# Patient Record
Sex: Female | Born: 1965 | Race: White | Hispanic: No | Smoking: Former smoker
Health system: Southern US, Community
[De-identification: ages and names within clinical notes are randomized; demographics above are authoritative.]

## PROBLEM LIST (undated history)

## (undated) DIAGNOSIS — M51369 Other intervertebral disc degeneration, lumbar region without mention of lumbar back pain or lower extremity pain: Secondary | ICD-10-CM

## (undated) DIAGNOSIS — G43909 Migraine, unspecified, not intractable, without status migrainosus: Secondary | ICD-10-CM

## (undated) DIAGNOSIS — I10 Essential (primary) hypertension: Secondary | ICD-10-CM

## (undated) DIAGNOSIS — I5020 Unspecified systolic (congestive) heart failure: Secondary | ICD-10-CM

## (undated) DIAGNOSIS — M549 Dorsalgia, unspecified: Secondary | ICD-10-CM

## (undated) DIAGNOSIS — G8929 Other chronic pain: Secondary | ICD-10-CM

## (undated) DIAGNOSIS — T7840XA Allergy, unspecified, initial encounter: Secondary | ICD-10-CM

## (undated) DIAGNOSIS — F329 Major depressive disorder, single episode, unspecified: Secondary | ICD-10-CM

## (undated) DIAGNOSIS — B019 Varicella without complication: Secondary | ICD-10-CM

## (undated) DIAGNOSIS — I255 Ischemic cardiomyopathy: Secondary | ICD-10-CM

## (undated) DIAGNOSIS — R55 Syncope and collapse: Secondary | ICD-10-CM

## (undated) DIAGNOSIS — E785 Hyperlipidemia, unspecified: Secondary | ICD-10-CM

## (undated) DIAGNOSIS — M5136 Other intervertebral disc degeneration, lumbar region: Secondary | ICD-10-CM

## (undated) DIAGNOSIS — R5382 Chronic fatigue, unspecified: Secondary | ICD-10-CM

## (undated) DIAGNOSIS — F32A Depression, unspecified: Secondary | ICD-10-CM

## (undated) DIAGNOSIS — I2510 Atherosclerotic heart disease of native coronary artery without angina pectoris: Secondary | ICD-10-CM

## (undated) HISTORY — DX: Atherosclerotic heart disease of native coronary artery without angina pectoris: I25.10

## (undated) HISTORY — PX: CERVICAL DISC SURGERY: SHX588

## (undated) HISTORY — DX: Major depressive disorder, single episode, unspecified: F32.9

## (undated) HISTORY — DX: Hyperlipidemia, unspecified: E78.5

## (undated) HISTORY — DX: Varicella without complication: B01.9

## (undated) HISTORY — DX: Chronic fatigue, unspecified: R53.82

## (undated) HISTORY — DX: Migraine, unspecified, not intractable, without status migrainosus: G43.909

## (undated) HISTORY — DX: Unspecified systolic (congestive) heart failure: I50.20

## (undated) HISTORY — DX: Allergy, unspecified, initial encounter: T78.40XA

## (undated) HISTORY — PX: TUBAL LIGATION: SHX77

## (undated) HISTORY — DX: Ischemic cardiomyopathy: I25.5

## (undated) HISTORY — DX: Depression, unspecified: F32.A

## (undated) HISTORY — DX: Essential (primary) hypertension: I10

---

## 2003-04-29 ENCOUNTER — Ambulatory Visit (HOSPITAL_COMMUNITY): Admission: RE | Admit: 2003-04-29 | Discharge: 2003-04-30 | Payer: Self-pay | Admitting: Neurosurgery

## 2005-01-10 ENCOUNTER — Ambulatory Visit: Payer: Self-pay | Admitting: Pain Medicine

## 2005-01-23 ENCOUNTER — Ambulatory Visit: Payer: Self-pay | Admitting: Pain Medicine

## 2005-02-13 ENCOUNTER — Ambulatory Visit: Payer: Self-pay | Admitting: Pain Medicine

## 2005-03-16 ENCOUNTER — Ambulatory Visit: Payer: Self-pay | Admitting: Pain Medicine

## 2005-03-22 ENCOUNTER — Ambulatory Visit: Payer: Self-pay | Admitting: Pain Medicine

## 2005-05-04 ENCOUNTER — Ambulatory Visit: Payer: Self-pay | Admitting: Pain Medicine

## 2005-06-13 ENCOUNTER — Ambulatory Visit: Payer: Self-pay | Admitting: Pain Medicine

## 2005-06-19 ENCOUNTER — Ambulatory Visit: Payer: Self-pay | Admitting: Pain Medicine

## 2005-07-20 ENCOUNTER — Ambulatory Visit: Payer: Self-pay | Admitting: Pain Medicine

## 2005-09-20 ENCOUNTER — Ambulatory Visit: Payer: Self-pay | Admitting: Pain Medicine

## 2005-10-24 ENCOUNTER — Ambulatory Visit: Payer: Self-pay | Admitting: Pain Medicine

## 2005-12-16 ENCOUNTER — Emergency Department: Payer: Self-pay | Admitting: Emergency Medicine

## 2006-01-16 ENCOUNTER — Ambulatory Visit: Payer: Self-pay | Admitting: Pain Medicine

## 2006-01-29 ENCOUNTER — Ambulatory Visit: Payer: Self-pay | Admitting: Pain Medicine

## 2006-02-12 ENCOUNTER — Ambulatory Visit: Payer: Self-pay | Admitting: Pain Medicine

## 2006-02-27 ENCOUNTER — Ambulatory Visit: Payer: Self-pay | Admitting: Pain Medicine

## 2006-03-07 ENCOUNTER — Ambulatory Visit: Payer: Self-pay | Admitting: Pain Medicine

## 2006-03-27 ENCOUNTER — Ambulatory Visit: Payer: Self-pay | Admitting: Pain Medicine

## 2006-04-23 ENCOUNTER — Ambulatory Visit: Payer: Self-pay | Admitting: Pain Medicine

## 2006-05-03 ENCOUNTER — Ambulatory Visit: Payer: Self-pay | Admitting: Pain Medicine

## 2006-05-07 ENCOUNTER — Ambulatory Visit: Payer: Self-pay | Admitting: Pain Medicine

## 2006-06-05 ENCOUNTER — Ambulatory Visit: Payer: Self-pay | Admitting: Pain Medicine

## 2006-07-09 ENCOUNTER — Ambulatory Visit: Payer: Self-pay | Admitting: Pain Medicine

## 2006-08-07 ENCOUNTER — Ambulatory Visit: Payer: Self-pay | Admitting: Pain Medicine

## 2006-08-20 ENCOUNTER — Ambulatory Visit: Payer: Self-pay | Admitting: Pain Medicine

## 2006-09-13 ENCOUNTER — Ambulatory Visit: Payer: Self-pay | Admitting: Pain Medicine

## 2006-10-16 ENCOUNTER — Ambulatory Visit: Payer: Self-pay | Admitting: Pain Medicine

## 2006-11-21 ENCOUNTER — Ambulatory Visit: Payer: Self-pay | Admitting: Pain Medicine

## 2006-11-28 ENCOUNTER — Ambulatory Visit: Payer: Self-pay | Admitting: Pain Medicine

## 2006-12-18 ENCOUNTER — Ambulatory Visit: Payer: Self-pay | Admitting: Pain Medicine

## 2007-01-17 ENCOUNTER — Ambulatory Visit: Payer: Self-pay | Admitting: Pain Medicine

## 2007-01-28 ENCOUNTER — Ambulatory Visit: Payer: Self-pay | Admitting: Pain Medicine

## 2007-02-14 ENCOUNTER — Ambulatory Visit: Payer: Self-pay | Admitting: Pain Medicine

## 2007-03-11 ENCOUNTER — Ambulatory Visit: Payer: Self-pay | Admitting: Pain Medicine

## 2007-04-09 ENCOUNTER — Ambulatory Visit: Payer: Self-pay | Admitting: Pain Medicine

## 2007-04-17 ENCOUNTER — Ambulatory Visit: Payer: Self-pay | Admitting: Pain Medicine

## 2007-05-15 ENCOUNTER — Ambulatory Visit: Payer: Self-pay | Admitting: Pain Medicine

## 2007-06-20 ENCOUNTER — Ambulatory Visit: Payer: Self-pay | Admitting: Pain Medicine

## 2007-07-08 ENCOUNTER — Ambulatory Visit: Payer: Self-pay | Admitting: Pain Medicine

## 2007-07-18 ENCOUNTER — Ambulatory Visit: Payer: Self-pay | Admitting: Pain Medicine

## 2007-08-13 ENCOUNTER — Ambulatory Visit: Payer: Self-pay | Admitting: Pain Medicine

## 2007-08-26 ENCOUNTER — Ambulatory Visit: Payer: Self-pay | Admitting: Pain Medicine

## 2007-09-10 ENCOUNTER — Ambulatory Visit: Payer: Self-pay | Admitting: Pain Medicine

## 2007-10-15 ENCOUNTER — Ambulatory Visit: Payer: Self-pay | Admitting: Pain Medicine

## 2007-11-18 ENCOUNTER — Ambulatory Visit: Payer: Self-pay | Admitting: Pain Medicine

## 2007-12-12 ENCOUNTER — Ambulatory Visit: Payer: Self-pay | Admitting: Pain Medicine

## 2008-01-07 ENCOUNTER — Ambulatory Visit: Payer: Self-pay | Admitting: Pain Medicine

## 2008-01-08 ENCOUNTER — Ambulatory Visit: Payer: Self-pay | Admitting: Pain Medicine

## 2008-02-11 ENCOUNTER — Ambulatory Visit: Payer: Self-pay | Admitting: Pain Medicine

## 2008-03-02 ENCOUNTER — Ambulatory Visit: Payer: Self-pay | Admitting: Pain Medicine

## 2008-04-09 ENCOUNTER — Ambulatory Visit: Payer: Self-pay | Admitting: Pain Medicine

## 2008-05-12 ENCOUNTER — Ambulatory Visit: Payer: Self-pay | Admitting: Pain Medicine

## 2008-06-09 ENCOUNTER — Ambulatory Visit: Payer: Self-pay | Admitting: Pain Medicine

## 2008-06-15 ENCOUNTER — Ambulatory Visit: Payer: Self-pay | Admitting: Pain Medicine

## 2008-07-09 ENCOUNTER — Ambulatory Visit: Payer: Self-pay | Admitting: Pain Medicine

## 2008-07-27 ENCOUNTER — Ambulatory Visit: Payer: Self-pay | Admitting: Pain Medicine

## 2008-08-24 ENCOUNTER — Ambulatory Visit: Payer: Self-pay | Admitting: Pain Medicine

## 2008-09-28 ENCOUNTER — Ambulatory Visit: Payer: Self-pay | Admitting: Pain Medicine

## 2008-10-27 ENCOUNTER — Ambulatory Visit: Payer: Self-pay | Admitting: Pain Medicine

## 2008-11-24 ENCOUNTER — Ambulatory Visit: Payer: Self-pay | Admitting: Pain Medicine

## 2008-11-30 ENCOUNTER — Ambulatory Visit: Payer: Self-pay | Admitting: Pain Medicine

## 2008-12-24 ENCOUNTER — Ambulatory Visit: Payer: Self-pay | Admitting: Pain Medicine

## 2009-01-19 ENCOUNTER — Ambulatory Visit: Payer: Self-pay | Admitting: Pain Medicine

## 2009-02-16 ENCOUNTER — Ambulatory Visit: Payer: Self-pay | Admitting: Pain Medicine

## 2009-03-01 ENCOUNTER — Ambulatory Visit: Payer: Self-pay | Admitting: Pain Medicine

## 2009-03-16 ENCOUNTER — Ambulatory Visit: Payer: Self-pay | Admitting: Pain Medicine

## 2009-04-12 ENCOUNTER — Ambulatory Visit: Payer: Self-pay | Admitting: Pain Medicine

## 2009-05-11 ENCOUNTER — Ambulatory Visit: Payer: Self-pay | Admitting: Pain Medicine

## 2009-07-07 ENCOUNTER — Ambulatory Visit: Payer: Self-pay | Admitting: Pain Medicine

## 2009-08-10 ENCOUNTER — Ambulatory Visit: Payer: Self-pay | Admitting: Pain Medicine

## 2009-08-16 ENCOUNTER — Ambulatory Visit: Payer: Self-pay | Admitting: Pain Medicine

## 2009-09-07 ENCOUNTER — Ambulatory Visit: Payer: Self-pay | Admitting: Pain Medicine

## 2009-09-13 ENCOUNTER — Ambulatory Visit: Payer: Self-pay | Admitting: Pain Medicine

## 2009-10-07 ENCOUNTER — Ambulatory Visit: Payer: Self-pay | Admitting: Pain Medicine

## 2009-10-25 ENCOUNTER — Ambulatory Visit: Payer: Self-pay | Admitting: Pain Medicine

## 2009-11-09 ENCOUNTER — Ambulatory Visit: Payer: Self-pay | Admitting: Pain Medicine

## 2009-12-08 ENCOUNTER — Ambulatory Visit: Payer: Self-pay | Admitting: Pain Medicine

## 2010-01-04 ENCOUNTER — Ambulatory Visit: Payer: Self-pay | Admitting: Pain Medicine

## 2010-01-31 ENCOUNTER — Ambulatory Visit: Payer: Self-pay | Admitting: Pain Medicine

## 2010-02-24 ENCOUNTER — Ambulatory Visit: Payer: Self-pay | Admitting: Pain Medicine

## 2010-03-21 ENCOUNTER — Ambulatory Visit: Payer: Self-pay | Admitting: Pain Medicine

## 2010-03-31 ENCOUNTER — Ambulatory Visit: Payer: Self-pay | Admitting: Pain Medicine

## 2010-04-18 ENCOUNTER — Ambulatory Visit: Payer: Self-pay | Admitting: Pain Medicine

## 2010-05-26 ENCOUNTER — Ambulatory Visit: Payer: Self-pay | Admitting: Pain Medicine

## 2010-06-13 ENCOUNTER — Ambulatory Visit: Payer: Self-pay | Admitting: Pain Medicine

## 2010-07-26 ENCOUNTER — Ambulatory Visit: Payer: Self-pay | Admitting: Pain Medicine

## 2010-08-22 ENCOUNTER — Ambulatory Visit: Payer: Self-pay | Admitting: Pain Medicine

## 2010-09-29 ENCOUNTER — Ambulatory Visit: Payer: Self-pay | Admitting: Pain Medicine

## 2010-10-31 ENCOUNTER — Ambulatory Visit: Payer: Self-pay | Admitting: Pain Medicine

## 2010-11-29 ENCOUNTER — Ambulatory Visit: Payer: Self-pay | Admitting: Pain Medicine

## 2010-12-27 ENCOUNTER — Ambulatory Visit: Payer: Self-pay | Admitting: Pain Medicine

## 2011-01-26 ENCOUNTER — Ambulatory Visit: Payer: Self-pay | Admitting: Pain Medicine

## 2011-02-22 ENCOUNTER — Ambulatory Visit: Payer: Self-pay | Admitting: Pain Medicine

## 2011-03-23 ENCOUNTER — Ambulatory Visit: Payer: Self-pay | Admitting: Pain Medicine

## 2011-04-27 ENCOUNTER — Ambulatory Visit: Payer: Self-pay | Admitting: Pain Medicine

## 2011-05-08 ENCOUNTER — Ambulatory Visit: Payer: Self-pay | Admitting: Pain Medicine

## 2011-05-23 ENCOUNTER — Ambulatory Visit: Payer: Self-pay | Admitting: Pain Medicine

## 2011-06-21 ENCOUNTER — Ambulatory Visit: Payer: Self-pay | Admitting: Pain Medicine

## 2011-07-20 ENCOUNTER — Ambulatory Visit: Payer: Self-pay | Admitting: Pain Medicine

## 2011-08-22 ENCOUNTER — Ambulatory Visit: Payer: Self-pay | Admitting: Pain Medicine

## 2011-09-04 ENCOUNTER — Ambulatory Visit: Payer: Self-pay | Admitting: Pain Medicine

## 2011-09-27 ENCOUNTER — Ambulatory Visit: Payer: Self-pay | Admitting: Pain Medicine

## 2011-10-04 ENCOUNTER — Ambulatory Visit: Payer: Self-pay | Admitting: Pain Medicine

## 2011-10-26 ENCOUNTER — Ambulatory Visit: Payer: Self-pay | Admitting: Pain Medicine

## 2011-11-23 ENCOUNTER — Ambulatory Visit: Payer: Self-pay | Admitting: Pain Medicine

## 2011-11-29 ENCOUNTER — Ambulatory Visit: Payer: Self-pay | Admitting: Pain Medicine

## 2011-12-20 ENCOUNTER — Ambulatory Visit: Payer: Self-pay | Admitting: Pain Medicine

## 2012-01-04 ENCOUNTER — Emergency Department: Payer: Self-pay | Admitting: Emergency Medicine

## 2012-01-04 LAB — COMPREHENSIVE METABOLIC PANEL
Alkaline Phosphatase: 88 U/L (ref 50–136)
Anion Gap: 9 (ref 7–16)
Chloride: 104 mmol/L (ref 98–107)
Co2: 28 mmol/L (ref 21–32)
Creatinine: 0.75 mg/dL (ref 0.60–1.30)
EGFR (Non-African Amer.): >60
Osmolality: 279 (ref 275–301)
SGOT(AST): 24 U/L (ref 15–37)
SGPT (ALT): 24 U/L

## 2012-01-04 LAB — URINALYSIS, COMPLETE
Bilirubin,UR: NEGATIVE
Glucose,UR: NEGATIVE mg/dL (ref 0–75)
Ketone: NEGATIVE
Nitrite: NEGATIVE
Ph: 6 (ref 4.5–8.0)
RBC,UR: 1 /HPF (ref 0–5)
WBC UR: 1 /HPF (ref 0–5)

## 2012-01-04 LAB — CBC
HGB: 13.7 g/dL (ref 12.0–16.0)
MCHC: 33.9 g/dL (ref 32.0–36.0)
RDW: 12.6 % (ref 11.5–14.5)
WBC: 10.1 10*3/uL (ref 3.6–11.0)

## 2012-01-18 ENCOUNTER — Ambulatory Visit: Payer: Self-pay | Admitting: Pain Medicine

## 2012-01-31 ENCOUNTER — Ambulatory Visit: Payer: Self-pay | Admitting: Pain Medicine

## 2012-02-20 ENCOUNTER — Ambulatory Visit: Payer: Self-pay | Admitting: Pain Medicine

## 2012-03-19 ENCOUNTER — Ambulatory Visit: Payer: Self-pay | Admitting: Pain Medicine

## 2012-04-10 ENCOUNTER — Ambulatory Visit: Payer: Self-pay | Admitting: Pain Medicine

## 2012-05-13 ENCOUNTER — Ambulatory Visit: Payer: Self-pay | Admitting: Pain Medicine

## 2012-06-18 ENCOUNTER — Ambulatory Visit: Payer: Self-pay | Admitting: Pain Medicine

## 2012-07-01 ENCOUNTER — Ambulatory Visit: Payer: Self-pay | Admitting: Pain Medicine

## 2012-07-18 ENCOUNTER — Ambulatory Visit: Payer: Self-pay | Admitting: Pain Medicine

## 2012-08-14 ENCOUNTER — Ambulatory Visit: Payer: Self-pay | Admitting: Pain Medicine

## 2012-09-12 ENCOUNTER — Ambulatory Visit: Payer: Self-pay | Admitting: Pain Medicine

## 2012-09-30 ENCOUNTER — Ambulatory Visit: Payer: Self-pay | Admitting: Pain Medicine

## 2012-10-15 ENCOUNTER — Ambulatory Visit: Payer: Self-pay | Admitting: Pain Medicine

## 2012-11-18 ENCOUNTER — Ambulatory Visit: Payer: Self-pay | Admitting: Pain Medicine

## 2012-12-11 ENCOUNTER — Ambulatory Visit: Payer: Self-pay | Admitting: Pain Medicine

## 2013-01-09 ENCOUNTER — Ambulatory Visit: Payer: Self-pay | Admitting: Pain Medicine

## 2013-02-12 ENCOUNTER — Ambulatory Visit: Payer: Self-pay | Admitting: Pain Medicine

## 2013-03-10 ENCOUNTER — Ambulatory Visit: Payer: Self-pay | Admitting: Pain Medicine

## 2013-04-08 ENCOUNTER — Ambulatory Visit: Payer: Self-pay | Admitting: Pain Medicine

## 2013-05-14 ENCOUNTER — Ambulatory Visit: Payer: Self-pay | Admitting: Pain Medicine

## 2013-06-11 ENCOUNTER — Ambulatory Visit: Payer: Self-pay | Admitting: Pain Medicine

## 2013-07-10 ENCOUNTER — Ambulatory Visit: Payer: Self-pay | Admitting: Pain Medicine

## 2013-08-11 ENCOUNTER — Ambulatory Visit: Payer: Self-pay | Admitting: Pain Medicine

## 2013-09-09 ENCOUNTER — Ambulatory Visit: Payer: Self-pay | Admitting: Pain Medicine

## 2013-09-29 ENCOUNTER — Ambulatory Visit: Payer: Self-pay | Admitting: Pain Medicine

## 2013-10-09 ENCOUNTER — Ambulatory Visit: Payer: Self-pay | Admitting: Pain Medicine

## 2013-10-27 ENCOUNTER — Ambulatory Visit: Payer: Self-pay | Admitting: Pain Medicine

## 2013-11-04 ENCOUNTER — Ambulatory Visit: Payer: Self-pay | Admitting: Pain Medicine

## 2013-12-03 ENCOUNTER — Ambulatory Visit: Payer: Self-pay | Admitting: Pain Medicine

## 2014-01-06 ENCOUNTER — Ambulatory Visit: Payer: Self-pay | Admitting: Pain Medicine

## 2014-02-05 ENCOUNTER — Ambulatory Visit: Payer: Self-pay | Admitting: Pain Medicine

## 2014-03-04 ENCOUNTER — Ambulatory Visit: Payer: Self-pay | Admitting: Pain Medicine

## 2014-04-07 ENCOUNTER — Ambulatory Visit: Payer: Self-pay | Admitting: Pain Medicine

## 2014-04-22 ENCOUNTER — Ambulatory Visit: Payer: Self-pay | Admitting: Pain Medicine

## 2014-05-07 ENCOUNTER — Ambulatory Visit: Payer: Self-pay | Admitting: Pain Medicine

## 2014-05-20 ENCOUNTER — Ambulatory Visit: Payer: Self-pay | Admitting: Pain Medicine

## 2014-06-02 ENCOUNTER — Ambulatory Visit: Payer: Self-pay | Admitting: Pain Medicine

## 2014-06-29 ENCOUNTER — Ambulatory Visit: Payer: Self-pay | Admitting: Pain Medicine

## 2014-07-23 ENCOUNTER — Ambulatory Visit: Payer: Self-pay | Admitting: Pain Medicine

## 2014-08-10 ENCOUNTER — Ambulatory Visit: Payer: Self-pay | Admitting: Pain Medicine

## 2014-09-03 ENCOUNTER — Ambulatory Visit: Payer: Self-pay | Admitting: Pain Medicine

## 2014-10-01 ENCOUNTER — Ambulatory Visit: Payer: Self-pay | Admitting: Pain Medicine

## 2014-10-28 ENCOUNTER — Ambulatory Visit: Payer: Self-pay | Admitting: Pain Medicine

## 2014-11-26 ENCOUNTER — Ambulatory Visit: Payer: Self-pay | Admitting: Pain Medicine

## 2014-12-02 ENCOUNTER — Ambulatory Visit: Payer: Self-pay | Admitting: Pain Medicine

## 2014-12-29 ENCOUNTER — Ambulatory Visit
Admit: 2014-12-29 | Disposition: A | Discharge status: Home / Self Care | Payer: Self-pay | Attending: Pain Medicine | Admitting: Pain Medicine

## 2015-01-28 ENCOUNTER — Ambulatory Visit: Payer: Self-pay | Attending: Pain Medicine | Admitting: Pain Medicine

## 2015-01-28 ENCOUNTER — Encounter: Payer: Self-pay | Admitting: Pain Medicine

## 2015-01-28 ENCOUNTER — Encounter (INDEPENDENT_AMBULATORY_CARE_PROVIDER_SITE_OTHER): Payer: Self-pay

## 2015-01-28 VITALS — BP 163/85 | HR 82 | Temp 98.4°F | Resp 16 | Wt 139.0 lb

## 2015-01-28 DIAGNOSIS — M5137 Other intervertebral disc degeneration, lumbosacral region: Secondary | ICD-10-CM

## 2015-01-28 DIAGNOSIS — M51379 Other intervertebral disc degeneration, lumbosacral region without mention of lumbar back pain or lower extremity pain: Secondary | ICD-10-CM | POA: Insufficient documentation

## 2015-01-28 DIAGNOSIS — G588 Other specified mononeuropathies: Secondary | ICD-10-CM | POA: Insufficient documentation

## 2015-01-28 DIAGNOSIS — R51 Headache: Secondary | ICD-10-CM | POA: Insufficient documentation

## 2015-01-28 DIAGNOSIS — M5481 Occipital neuralgia: Secondary | ICD-10-CM

## 2015-01-28 DIAGNOSIS — M545 Low back pain: Secondary | ICD-10-CM | POA: Insufficient documentation

## 2015-01-28 MED ORDER — TRAMADOL HCL 50 MG PO TABS: ORAL_TABLET | ORAL | Status: DC

## 2015-01-28 MED ORDER — HYDROCODONE-ACETAMINOPHEN 5-325 MG PO TABS: ORAL_TABLET | ORAL | Status: DC

## 2015-01-28 MED ORDER — ORPHENADRINE CITRATE ER 100 MG PO TB12: ORAL_TABLET | ORAL | Status: DC

## 2015-01-28 NOTE — Addendum Note (Signed)
Addended by: Valerie SaltsICE, Whitleigh Garramone M on: 01/28/2015 02:21 PM   Modules accepted: Level of Service

## 2015-01-28 NOTE — Progress Notes (Signed)
Ambulatory  Teach back 3 no aspirin products for 3 days 1026 scripts for hydrocodone  tramadol

## 2015-01-28 NOTE — Progress Notes (Signed)
   Subjective:    Patient ID: Crystal Roberson, female    DOB: [birth year], 49 y.o.   MRN: [id number]  HPI  Review of Systems LOW BACK PAIN LOWER EXTREMITY ESPECIALLY LEFT WITH SPASMS    MILD HEADACHE     Objective:   Physical Exam  Constitutional: She is oriented to person, place, and time. She appears well-developed and well-nourished.  HENT:  Head: Normocephalic.  Eyes: Pupils are equal, round, and reactive to light.  Neck: Normal range of motion. Neck supple.  Cardiovascular: Normal rate.   Pulmonary/Chest: Effort normal and breath sounds normal.  Abdominal: Soft. Bowel sounds are normal.  Musculoskeletal: She exhibits tenderness.  Neurological: She is alert and oriented to person, place, and time. She has normal reflexes.   TENDERNESS OF LUMBAR REGION SEVERE ON LEFT WITH DECREASED SLR        Assessment & Plan:  Continue  Medications. We started norflex and pt takes zanaflex  As well. Ptt knows difference between norflex and zanaflex Nurses remind  Pt of resp and   Confusion. F/u Dr Vear ClockPhillips re bp and general condition  SELECTIVE NERVE ROOT BLOCK [facility] 02-08-15

## 2015-01-28 NOTE — Patient Instructions (Signed)
Selective Nerve Root Block Patient Information  Description: Specific nerve roots exit the spinal canal and these nerves can be compressed and inflamed by a bulging disc and bone spurs.  By injecting steroids on the nerve root, we can potentially decrease the inflammation surrounding these nerves, which often leads to decreased pain.  Also, by injecting local anesthesia on the nerve root, this can provide [place] helpful information to give to your referring doctor if it decreases your pain.  Selective nerve root blocks can be done along the spine from the neck to the low back depending on the location of your pain.   After numbing the skin with local anesthesia, a small needle is passed to the nerve root and the position of the needle is verified using x-ray pictures.  After the needle is in correct position, we then deposit the medication.  You may experience a pressure sensation while this is being done.  The entire block usually lasts less than 15 minutes.  Conditions that may be treated with selective nerve root blocks:  Low back and leg pain  Spinal stenosis  Diagnostic block prior to potential surgery  Neck and arm pain  Post laminectomy syndrome  Preparation for the injection:  1. Do not eat any solid food or dairy products within 6 hours of your appointment. 2. You may drink clear liquids up to 2 hours before an appointment.  Clear liquids include water, black coffee, juice or soda.  No milk or cream please. 3. You may take your regular medications, including pain medications, with a sip of water before your appointment.  Diabetics should hold regular insulin (if taken separately) and take 1/2 normal NPH dose the morning of the procedure.  Carry some sugar containing items with you to your appointment. 4. A driver must accompany you and be prepared to drive you home after your procedure. 5. Bring all your current medications with you. 6. An IV may be inserted and sedation may be given at  the discretion of the physician. 7. A blood pressure cuff, EKG, and other monitors will often be applied during the procedure.  Some patients may need to have extra oxygen administered for a short period. 8. You will be asked to provide medical information, including allergies, prior to the procedure.  We must know immediately if you are taking blood  Thinners (like Coumadin) or if you are allergic to IV iodine contrast (dye).  Possible side-effects: All are usually temporary  Bleeding from needle site  Light headedness  Numbness and tingling  Decreased blood pressure  Weakness in arms/legs  Pressure sensation in back/neck  Pain at injection site (several days)  Possible complications: All are extremely rare  Infection  Nerve injury  Spinal headache (a headache wore with upright position)  Call if you experience:  Fever/chills associated with headache or increased back/neck pain  Headache worsened by an upright position  New onset weakness or numbness of an extremity below the injection site  Hives or difficulty breathing (go to the emergency room)  Inflammation or drainage at the injection site(s)  Severe back/neck pain greater than usual  New symptoms which are concerning to you  Please note:  Although the local anesthetic injected can often make your back or neck feel good for several hours after the injection the pain will likely return.  It takes 3-5 days for steroids to work on the nerve root. You may not notice any pain relief for at least one week.  If effective,  we will often do a series of 3 injections spaced 3-6 weeks apart to maximally decrease your pain.    If you have any questions, please call [phone] [facility]

## 2015-02-07 ENCOUNTER — Encounter: Payer: Self-pay | Admitting: Pain Medicine

## 2015-02-07 DIAGNOSIS — M5481 Occipital neuralgia: Secondary | ICD-10-CM

## 2015-02-07 DIAGNOSIS — M533 Sacrococcygeal disorders, not elsewhere classified: Secondary | ICD-10-CM

## 2015-02-07 DIAGNOSIS — M5136 Other intervertebral disc degeneration, lumbar region: Secondary | ICD-10-CM | POA: Insufficient documentation

## 2015-02-07 DIAGNOSIS — M51369 Other intervertebral disc degeneration, lumbar region without mention of lumbar back pain or lower extremity pain: Secondary | ICD-10-CM

## 2015-02-08 ENCOUNTER — Encounter: Payer: Self-pay | Admitting: Pain Medicine

## 2015-02-08 ENCOUNTER — Ambulatory Visit: Payer: Self-pay | Attending: Pain Medicine | Admitting: Pain Medicine

## 2015-02-08 VITALS — BP 125/67 | HR 72 | Temp 98.2°F | Resp 12 | Ht 65.0 in | Wt 139.0 lb

## 2015-02-08 DIAGNOSIS — M5136 Other intervertebral disc degeneration, lumbar region: Secondary | ICD-10-CM | POA: Insufficient documentation

## 2015-02-08 DIAGNOSIS — M5416 Radiculopathy, lumbar region: Secondary | ICD-10-CM

## 2015-02-08 DIAGNOSIS — M51369 Other intervertebral disc degeneration, lumbar region without mention of lumbar back pain or lower extremity pain: Secondary | ICD-10-CM

## 2015-02-08 MED ORDER — BUPIVACAINE HCL (PF) 0.25 % IJ SOLN
INTRAMUSCULAR | Status: AC
  Administered 2015-02-08: 30 mL
  Filled 2015-02-08: qty 30

## 2015-02-08 MED ORDER — ORPHENADRINE CITRATE 30 MG/ML IJ SOLN
INTRAMUSCULAR | Status: AC
  Administered 2015-02-08: 60 mg
  Filled 2015-02-08: qty 2

## 2015-02-08 MED ORDER — MIDAZOLAM HCL 5 MG/5ML IJ SOLN
INTRAMUSCULAR | Status: AC
  Administered 2015-02-08: 4 mg via INTRAVENOUS
  Filled 2015-02-08: qty 5

## 2015-02-08 MED ORDER — FENTANYL CITRATE (PF) 100 MCG/2ML IJ SOLN
INTRAMUSCULAR | Status: AC
  Administered 2015-02-08: 100 ug via INTRAVASCULAR
  Filled 2015-02-08: qty 2

## 2015-02-08 MED ORDER — TRIAMCINOLONE ACETONIDE 40 MG/ML IJ SUSP
INTRAMUSCULAR | Status: AC
  Administered 2015-02-08: 10 mg
  Filled 2015-02-08: qty 1

## 2015-02-08 NOTE — Progress Notes (Signed)
   Subjective:    Patient ID: Crystal Roberson, female    DOB: [birth year], 49 y.o.   MRN: [id number]  HPI    Review of Systems     Objective:   Physical Exam        Assessment & Plan:

## 2015-02-08 NOTE — Progress Notes (Signed)
   Subjective:    Patient ID: Crystal Roberson, female    DOB: 09-10-1966, 49 y.o.   MRN: [id number]  HPI  Preoperative assessment completed Review of Systems     Objective:   Physical Exam    Assessment completed lung abdomen within normal limits ASA class II moderate sedation for procedure patient NPL       Assessment & Plan:

## 2015-02-08 NOTE — Progress Notes (Signed)
Discharged via w/c at 1:00 pm. Tolerating po fluids well. Instructions (verbal and written) given to patient. Teachback x3.  

## 2015-02-08 NOTE — Patient Instructions (Addendum)
Continue present medications and antibioticcs  F/U PCP for evaliation of  BP and general medical  condition.  F/U surgical evaluation.  F/U nrurological evaluation.  May consider radiofrequency rhizolysis or intraspinal procedures pending response to present treatment and F/U evaluation.  Patient to call [facility] Center should patient have concerns prior to scheduled return appointment.    Pain Management Discharge Instructions  General Discharge Instructions :  If you need to reach your doctor call: Monday-Friday 8:00 am - 4:00 pm at [phone] or toll free [phone].  After clinic hours [phone] to have operator reach doctor.  Bring all of your medication bottles to all your appointments in the pain clinic.  To cancel or reschedule your appointment with Pain Management please remember to call 24 hours in advance to avoid a fee.  Refer to the educational materials which you have been given on: General Risks, I had my Procedure. Discharge Instructions, Post Sedation.  Post Procedure Instructions:  The drugs you were given will stay in your system until tomorrow, so for the next 24 hours you should not drive, make any legal decisions or drink any alcoholic beverages.  You may eat anything you prefer, but it is better to start with liquids then soups and crackers, and gradually work up to solid foods.  Please notify your doctor immediately if you have any unusual bleeding, trouble breathing or pain that is not related to your normal pain.  Depending on the type of procedure that was done, some parts of your body may feel week and/or numb.  This usually clears up by tonight or the next day.  Walk with the use of an assistive device or accompanied by an adult for the 24 hours.  You may use ice on the affected area for the first 24 hours.  Put ice in a Ziploc bag and cover with a towel and place against area 15 minutes on 15 minutes off.  You may switch to heat after  24 hours. 

## 2015-02-08 NOTE — Progress Notes (Signed)
PROCEDURE PERFORMED: Lumbosacral selective nerve root block   NOTE: The patient is a 49 y.o. female who returns to [facility] for further evaluation and treatment of pain involving the lumbar and lower extremity region. Studies consisting of MRI with degenerative changes of the lumbar spine with L3 nerve root deviation which may reflect compression of the nerve roots on the left of the neural foramen and asymmetric bulging L5-S1 to the right of the midlline.There is concern regarding intraspinal abnormalities contributing to the patient's symptomatology. The risks, benefits, and expectations of the procedure have been explained to the patient who was understanding and in agreement with suggested treatment plan. We will proceed with interventional treatment as discussed and as explained to the patient. The patient is understanding and in agreement with suggested treatment plan.   DESCRIPTION OF PROCEDURE: Lumbosacral selective nerve root block with IV Versed, IV fentanyl conscious sedation, EKG, blood pressure, pulse, and pulse oximetry monitoring. The procedure was performed with the patient in the prone position under fluoroscopic guidance. With the patient in the prone position, Betadine prep of proposed entry site was performed. Local anesthetic skin wheal of proposed needle entry site was prepared with 1.5% plain lidocaine with AP view of the lumbosacral spine.   PROCEDURE #1: Needle placement at the L 2 vertebral body: A 22 -gauge needle was inserted at the inferior border of the transverse process of the vertebral body with needle placed medial to the midline of the transverse process on AP view of the lumbosacral spine.  Repeat this technique at L3, L4, L5, on the left side .    Needle placement was then verified on lateral view at all levels with needle tip documented to be in the posterior superior quadrant of the intervertebral foramen of  L 2 L3 L4 L5.. Following negative  aspiration for heme and CSF at each level, each level was injected with 3 mL of 0.25% bupivacaine with Kenalog. The patient tolerated the procedure well. A total of 10 mg of Kenalog was utilized for the procedure.   PLAN:  1. Medications: Will continue presently prescribed medications. 2. The patient is to undergo follow-up evaluation with Dr. Vear ClockPhillips for evaluation of blood pressure and general medical condition status post procedure performed on today's visit. 3. Surgical follow-up evaluation. 4. Neurological evaluation. 5. May consider radiofrequency procedures, implantation type procedures and other treatment pending response to treatment and follow-up evaluation. 6. The patient has been advise do adhere to proper body mechanics and avoid activities which may aggravate condition. 7. The patient has been advised to call the Pain Management Center prior to scheduled return appointment should there be significant change in the patient's condition or should the patient have other concerns regarding condition prior to scheduled return appointment.

## 2015-02-09 ENCOUNTER — Telehealth: Payer: Self-pay | Admitting: *Deleted

## 2015-02-09 NOTE — Telephone Encounter (Signed)
Left message for patient to call for andy problems / concerns

## 2015-03-11 ENCOUNTER — Ambulatory Visit: Payer: Self-pay | Attending: Pain Medicine | Admitting: Pain Medicine

## 2015-03-11 ENCOUNTER — Encounter: Payer: Self-pay | Admitting: Pain Medicine

## 2015-03-11 VITALS — BP 155/88 | HR 84 | Temp 96.9°F | Resp 16 | Ht 65.0 in | Wt 138.0 lb

## 2015-03-11 DIAGNOSIS — M5137 Other intervertebral disc degeneration, lumbosacral region: Secondary | ICD-10-CM

## 2015-03-11 DIAGNOSIS — M5416 Radiculopathy, lumbar region: Secondary | ICD-10-CM | POA: Insufficient documentation

## 2015-03-11 DIAGNOSIS — M5481 Occipital neuralgia: Secondary | ICD-10-CM

## 2015-03-11 DIAGNOSIS — G588 Other specified mononeuropathies: Secondary | ICD-10-CM

## 2015-03-11 DIAGNOSIS — M51379 Other intervertebral disc degeneration, lumbosacral region without mention of lumbar back pain or lower extremity pain: Secondary | ICD-10-CM

## 2015-03-11 DIAGNOSIS — M47816 Spondylosis without myelopathy or radiculopathy, lumbar region: Secondary | ICD-10-CM

## 2015-03-11 DIAGNOSIS — M533 Sacrococcygeal disorders, not elsewhere classified: Secondary | ICD-10-CM

## 2015-03-11 DIAGNOSIS — M5136 Other intervertebral disc degeneration, lumbar region: Secondary | ICD-10-CM | POA: Insufficient documentation

## 2015-03-11 DIAGNOSIS — M51369 Other intervertebral disc degeneration, lumbar region without mention of lumbar back pain or lower extremity pain: Secondary | ICD-10-CM

## 2015-03-11 DIAGNOSIS — M47896 Other spondylosis, lumbar region: Secondary | ICD-10-CM | POA: Insufficient documentation

## 2015-03-11 MED ORDER — ORPHENADRINE CITRATE ER 100 MG PO TB12: ORAL_TABLET | ORAL | Status: DC

## 2015-03-11 MED ORDER — HYDROCODONE-ACETAMINOPHEN 5-325 MG PO TABS: ORAL_TABLET | ORAL | Status: DC

## 2015-03-11 MED ORDER — TRAMADOL HCL 50 MG PO TABS: ORAL_TABLET | ORAL | Status: DC

## 2015-03-11 MED ORDER — LIDOCAINE 5 % EX PTCH: 1.0000 | MEDICATED_PATCH | CUTANEOUS | Status: DC

## 2015-03-11 NOTE — Patient Instructions (Addendum)
Smoking Cessation Quitting smoking is important to your health and has many advantages. However, it is not always easy to quit since nicotine is a very addictive drug. Oftentimes, people try 3 times or more before being able to quit. This document explains the best ways for you to prepare to quit smoking. Quitting takes hard work and a lot of effort, but you can do it. ADVANTAGES OF QUITTING SMOKING 1. You will live longer, feel better, and live better. 2. Your body will feel the impact of quitting smoking almost immediately. 1. Within 20 minutes, blood pressure decreases. Your pulse returns to its normal level. 2. After 8 hours, carbon monoxide levels in the blood return to normal. Your oxygen level increases. 3. After 24 hours, the chance of having a heart attack starts to decrease. Your breath, hair, and body stop smelling like smoke. 4. After 48 hours, damaged nerve endings begin to recover. Your sense of taste and smell improve. 5. After 72 hours, the body is virtually free of nicotine. Your bronchial tubes relax and breathing becomes easier. 6. After 2 to 12 weeks, lungs can hold more air. Exercise becomes easier and circulation improves. 3. The risk of having a heart attack, stroke, cancer, or lung disease is greatly reduced. 1. After 1 year, the risk of coronary heart disease is cut in half. 2. After 5 years, the risk of stroke falls to the same as a nonsmoker. 3. After 10 years, the risk of lung cancer is cut in half and the risk of other cancers decreases significantly. 4. After 15 years, the risk of coronary heart disease drops, usually to the level of a nonsmoker. 4. If you are pregnant, quitting smoking will improve your chances of having a healthy baby. 5. The people you live with, especially any children, will be healthier. 6. You will have extra money to spend on things other than cigarettes. QUESTIONS TO THINK ABOUT BEFORE ATTEMPTING TO QUIT You may want to talk about your  answers with your health care provider. 1. Why do you want to quit? 2. If you tried to quit in the past, what helped and what did not? 3. What will be the most difficult situations for you after you quit? How will you plan to handle them? 4. Who can help you through the tough times? Your family? Friends? A health care provider? 5. What pleasures do you get from smoking? What ways can you still get pleasure if you quit? Here are some questions to ask your health care provider: 1. How can you help me to be successful at quitting? 2. What medicine do you think would be best for me and how should I take it? 3. What should I do if I need more help? 4. What is smoking withdrawal like? How can I get information on withdrawal? GET READY  Set a quit date.  Change your environment by getting rid of all cigarettes, ashtrays, matches, and lighters in your home, car, or work. Do not let people smoke in your home.  Review your past attempts to quit. Think about what worked and what did not. GET SUPPORT AND ENCOURAGEMENT You have a better chance of being successful if you have help. You can get support in many ways. 1. Tell your family, friends, and coworkers that you are going to quit and need their support. Ask them not to smoke around you. 2. Get individual, group, or telephone counseling and support. Programs are available at [employer] and health centers. Call  your local health department for information about programs in your area. 3. Spiritual beliefs and practices may help some smokers quit. 4. Download a "quit meter" on your computer to keep track of quit statistics, such as how long you have gone without smoking, cigarettes not smoked, and money saved. 5. Get a self-help book about quitting smoking and staying off tobacco. LEARN NEW SKILLS AND BEHAVIORS 1. Distract yourself from urges to smoke. Talk to someone, go for a walk, or occupy your time with a task. 2. Change your normal routine.  Take a different route to work. Drink tea instead of coffee. Eat breakfast in a different place. 3. Reduce your stress. Take a hot bath, exercise, or read a book. 4. Plan something enjoyable to do every day. Reward yourself for not smoking. 5. Explore interactive web-based programs that specialize in helping you quit. GET MEDICINE AND USE IT CORRECTLY Medicines can help you stop smoking and decrease the urge to smoke. Combining medicine with the above behavioral methods and support can greatly increase your chances of successfully quitting smoking.  Nicotine replacement therapy helps deliver nicotine to your body without the negative effects and risks of smoking. Nicotine replacement therapy includes nicotine gum, lozenges, inhalers, nasal sprays, and skin patches. Some may be available over-the-counter and others require a prescription.  Antidepressant medicine helps people abstain from smoking, but how this works is unknown. This medicine is available by prescription.  Nicotinic receptor partial agonist medicine simulates the effect of nicotine in your brain. This medicine is available by prescription. Ask your health care provider for advice about which medicines to use and how to use them based on your health history. Your health care provider will tell you what side effects to look out for if you choose to be on a medicine or therapy. Carefully read the information on the package. Do not use any other product containing nicotine while using a nicotine replacement product.  RELAPSE OR DIFFICULT SITUATIONS Most relapses occur within the first 3 months after quitting. Do not be discouraged if you start smoking again. Remember, most people try several times before finally quitting. You may have symptoms of withdrawal because your body is used to nicotine. You may crave cigarettes, be irritable, feel very hungry, cough often, get headaches, or have difficulty concentrating. The withdrawal symptoms are  only temporary. They are strongest when you first quit, but they will go away within 10-14 days. To reduce the chances of relapse, try to:  Avoid drinking alcohol. Drinking lowers your chances of successfully quitting.  Reduce the amount of caffeine you consume. Once you quit smoking, the amount of caffeine in your body increases and can give you symptoms, such as a rapid heartbeat, sweating, and anxiety.  Avoid smokers because they can make you want to smoke.  Do not let weight gain distract you. Many smokers will gain weight when they quit, usually less than 10 pounds. Eat a healthy diet and stay active. You can always lose the weight gained after you quit.  Find ways to improve your mood other than smoking. FOR MORE INFORMATION  www.smokefree.gov  Document Released: 09/05/2001 Document Revised: 01/26/2014 Document Reviewed: 12/21/2011 [facility] Patient Information 2015 [place], [place]. This information is not intended to replace advice given to you by your health care provider. Make sure you discuss any questions you have with your health care provider.   Continue present medications. Tramadol, hydrocodone acetaminophen, Norflex, and Lidoderm patches  Lumbosacral selective nerve root block Monday, 03/15/2015  F/U  PCP for evaliation of  BP and general medical  condition.  F/U surgical evaluation.  F/U neurological evaluation.  May consider radiofrequency rhizolysis or intraspinal procedures pending response to present treatment and F/U evaluation.  Patient to call [facility] should patient have concerns prior to scheduled return appointment.  GENERAL RISKS AND COMPLICATIONS  What are the risk, side effects and possible complications? Generally speaking, most procedures are safe.  However, with any procedure there are risks, side effects, and the possibility of complications.  The risks and complications are dependent upon the sites that are lesioned, or the type of  nerve block to be performed.  The closer the procedure is to the spine, the more serious the risks are.  Great care is taken when placing the radio frequency needles, block needles or lesioning probes, but sometimes complications can occur. 1. Infection: Any time there is an injection through the skin, there is a risk of infection.  This is why sterile conditions are used for these blocks.  There are four possible types of infection. 1. Localized skin infection. 2. Central Nervous System Infection-This can be in the form of Meningitis, which can be deadly. 3. Epidural Infections-This can be in the form of an epidural abscess, which can cause pressure inside of the spine, causing compression of the spinal cord with subsequent paralysis. This would require an emergency surgery to decompress, and there are no guarantees that the patient would recover from the paralysis. 4. Discitis-This is an infection of the intervertebral discs.  It occurs in about 1% of discography procedures.  It is difficult to treat and it may lead to surgery.        2. Pain: the needles have to go through skin and soft tissues, will cause soreness.       3. Damage to internal structures:  The nerves to be lesioned may be near blood vessels or    other nerves which can be potentially damaged.       4. Bleeding: Bleeding is more common if the patient is taking blood thinners such as  aspirin, Coumadin, Ticiid, Plavix, etc., or if he/she have some genetic predisposition  such as hemophilia. Bleeding into the spinal canal can cause compression of the spinal  cord with subsequent paralysis.  This would require an emergency surgery to  decompress and there are no guarantees that the patient would recover from the  paralysis.       5. Pneumothorax:  Puncturing of a lung is a possibility, every time a needle is introduced in  the area of the chest or upper back.  Pneumothorax refers to free air around the  collapsed lung(s), inside of the  thoracic cavity (chest cavity).  Another two possible  complications related to a similar event would include: Hemothorax and Chylothorax.   These are variations of the Pneumothorax, where instead of air around the collapsed  lung(s), you may have blood or chyle, respectively.       6. Spinal headaches: They may occur with any procedures in the area of the spine.       7. Persistent CSF (Cerebro-Spinal Fluid) leakage: This is a rare problem, but may occur  with prolonged intrathecal or epidural catheters either due to the formation of a fistulous  track or a dural tear.       8. Nerve damage: By working so close to the spinal cord, there is always a possibility of  nerve damage, which could be as serious as  a permanent spinal cord injury with  paralysis.       9. Death:  Although rare, severe deadly allergic reactions known as "Anaphylactic  reaction" can occur to any of the medications used.      10. Worsening of the symptoms:  We can always make thing worse.  What are the chances of something like this happening? Chances of any of this occuring are extremely low.  By statistics, you have more of a chance of getting killed in a motor vehicle accident: while driving to the hospital than any of the above occurring .  Nevertheless, you should be aware that they are possibilities.  In general, it is similar to taking a shower.  Everybody knows that you can slip, hit your head and get killed.  Does that mean that you should not shower again?  Nevertheless always keep in mind that statistics do not mean anything if you happen to be on the wrong side of them.  Even if a procedure has a 1 (one) in a 1,000,000 (million) chance of going wrong, it you happen to be that one..Also, keep in mind that by statistics, you have more of a chance of having something go wrong when taking medications.  Who should not have this procedure? If you are on a blood thinning medication (e.g. Coumadin, Plavix, see list of "Blood  Thinners"), or if you have an active infection going on, you should not have the procedure.  If you are taking any blood thinners, please inform your physician.  How should I prepare for this procedure?  Do not eat or drink anything at least six hours prior to the procedure.  Bring a driver with you .  It cannot be a taxi.  Come accompanied by an adult that can drive you back, and that is strong enough to help you if your legs get weak or numb from the local anesthetic.  Take all of your medicines the morning of the procedure with just enough water to swallow them.  If you have diabetes, make sure that you are scheduled to have your procedure done first thing in the morning, whenever possible.  If you have diabetes, take only half of your insulin dose and notify our nurse that you have done so as soon as you arrive at the clinic.  If you are diabetic, but only take blood sugar pills (oral hypoglycemic), then do not take them on the morning of your procedure.  You may take them after you have had the procedure.  Do not take aspirin or any aspirin-containing medications, at least eleven (11) days prior to the procedure.  They may prolong bleeding.  Wear loose fitting clothing that may be easy to take off and that you would not mind if it got stained with Betadine or blood.  Do not wear any jewelry or perfume  Remove any nail coloring.  It will interfere with some of our monitoring equipment.  NOTE: Remember that this is not meant to be interpreted as a complete list of all possible complications.  Unforeseen problems may occur.  BLOOD THINNERS The following drugs contain aspirin or other products, which can cause increased bleeding during surgery and should not be taken for 2 weeks prior to and 1 week after surgery.  If you should need take something for relief of minor pain, you may take acetaminophen which is found in Tylenol,m Datril, Anacin-3 and Panadol. It is not blood thinner. The  products listed below are.  Do  not take any of the products listed below in addition to any listed on your instruction sheet.  A.P.C or A.P.C with Codeine Codeine Phosphate Capsules #3 Ibuprofen Ridaura  ABC compound Congesprin Imuran rimadil  Advil Cope Indocin Robaxisal  Alka-Seltzer Effervescent Pain Reliever and Antacid Coricidin or Coricidin-D  Indomethacin Rufen  Alka-Seltzer plus Cold Medicine Cosprin Ketoprofen S-A-C Tablets  Anacin Analgesic Tablets or Capsules Coumadin Korlgesic Salflex  Anacin Extra Strength Analgesic tablets or capsules CP-2 Tablets Lanoril Salicylate  Anaprox Cuprimine Capsules Levenox Salocol  Anexsia-D Dalteparin Magan Salsalate  Anodynos Darvon compound Magnesium Salicylate Sine-off  Ansaid Dasin Capsules Magsal Sodium Salicylate  Anturane Depen Capsules Marnal Soma  APF Arthritis pain formula Dewitt's Pills Measurin Stanback  Argesic Dia-Gesic Meclofenamic Sulfinpyrazone  Arthritis Bayer Timed Release Aspirin Diclofenac Meclomen Sulindac  Arthritis pain formula Anacin Dicumarol Medipren Supac  Analgesic (Safety coated) Arthralgen Diffunasal Mefanamic Suprofen  Arthritis Strength Bufferin Dihydrocodeine Mepro Compound Suprol  Arthropan liquid Dopirydamole Methcarbomol with Aspirin Synalgos  ASA tablets/Enseals Disalcid Micrainin Tagament  Ascriptin Doan's Midol Talwin  Ascriptin A/D Dolene Mobidin Tanderil  Ascriptin Extra Strength Dolobid Moblgesic Ticlid  Ascriptin with Codeine Doloprin or Doloprin with Codeine Momentum Tolectin  Asperbuf Duoprin Mono-gesic Trendar  Aspergum Duradyne Motrin or Motrin IB Triminicin  Aspirin plain, buffered or enteric coated Durasal Myochrisine Trigesic  Aspirin Suppositories Easprin Nalfon Trillsate  Aspirin with Codeine Ecotrin Regular or Extra Strength Naprosyn Uracel  Atromid-S Efficin Naproxen Ursinus  Auranofin Capsules Elmiron Neocylate Vanquish  Axotal Emagrin Norgesic Verin  Azathioprine Empirin or Empirin  with Codeine Normiflo Vitamin E  Azolid Emprazil Nuprin Voltaren  Bayer Aspirin plain, buffered or children's or timed BC Tablets or powders Encaprin Orgaran Warfarin Sodium  Buff-a-Comp Enoxaparin Orudis Zorpin  Buff-a-Comp with Codeine Equegesic Os-Cal-Gesic   Buffaprin Excedrin plain, buffered or Extra Strength Oxalid   Bufferin Arthritis Strength Feldene Oxphenbutazone   Bufferin plain or Extra Strength Feldene Capsules Oxycodone with Aspirin   Bufferin with Codeine Fenoprofen Fenoprofen Pabalate or Pabalate-SF   Buffets II Flogesic Panagesic   Buffinol plain or Extra Strength Florinal or Florinal with Codeine Panwarfarin   Buf-Tabs Flurbiprofen Penicillamine   Butalbital Compound Four-way cold tablets Penicillin   Butazolidin Fragmin Pepto-Bismol   Carbenicillin Geminisyn Percodan   Carna Arthritis Reliever Geopen Persantine   Carprofen Gold's salt Persistin   Chloramphenicol Goody's Phenylbutazone   Chloromycetin Haltrain Piroxlcam   Clmetidine heparin Plaquenil   Cllnoril Hyco-pap Ponstel   Clofibrate Hydroxy chloroquine Propoxyphen         Before stopping any of these medications, be sure to consult the physician who ordered them.  Some, such as Coumadin (Warfarin) are ordered to prevent or treat serious conditions such as "deep thrombosis", "pumonary embolisms", and other heart problems.  The amount of time that you may need off of the medication may also vary with the medication and the reason for which you were taking it.  If you are taking any of these medications, please make sure you notify your pain physician before you undergo any procedures.         Selective Nerve Root Block Patient Information  Description: Specific nerve roots exit the spinal canal and these nerves can be compressed and inflamed by a bulging disc and bone spurs.  By injecting steroids on the nerve root, we can potentially decrease the inflammation surrounding these nerves, which often leads to  decreased pain.  Also, by injecting local anesthesia on the nerve root, this can provide [place] helpful information to  give to your referring doctor if it decreases your pain.  Selective nerve root blocks can be done along the spine from the neck to the low back depending on the location of your pain.   After numbing the skin with local anesthesia, a small needle is passed to the nerve root and the position of the needle is verified using x-ray pictures.  After the needle is in correct position, we then deposit the medication.  You may experience a pressure sensation while this is being done.  The entire block usually lasts less than 15 minutes.  Conditions that may be treated with selective nerve root blocks:  Low back and leg pain  Spinal stenosis  Diagnostic block prior to potential surgery  Neck and arm pain  Post laminectomy syndrome  Preparation for the injection:  6. Do not eat any solid food or dairy products within 6 hours of your appointment. 7. You may drink clear liquids up to 2 hours before an appointment.  Clear liquids include water, black coffee, juice or soda.  No milk or cream please. 8. You may take your regular medications, including pain medications, with a sip of water before your appointment.  Diabetics should hold regular insulin (if taken separately) and take 1/2 normal NPH dose the morning of the procedure.  Carry some sugar containing items with you to your appointment. 9. A driver must accompany you and be prepared to drive you home after your procedure. 10. Bring all your current medications with you. 11. An IV may be inserted and sedation may be given at the discretion of the physician. 12. A blood pressure cuff, EKG, and other monitors will often be applied during the procedure.  Some patients may need to have extra oxygen administered for a short period. 13. You will be asked to provide medical information, including allergies, prior to the procedure.  We must know  immediately if you are taking blood  Thinners (like Coumadin) or if you are allergic to IV iodine contrast (dye).  Possible side-effects: All are usually temporary  Bleeding from needle site  Light headedness  Numbness and tingling  Decreased blood pressure  Weakness in arms/legs  Pressure sensation in back/neck  Pain at injection site (several days)  Possible complications: All are extremely rare  Infection  Nerve injury  Spinal headache (a headache wore with upright position)  Call if you experience:  Fever/chills associated with headache or increased back/neck pain  Headache worsened by an upright position  New onset weakness or numbness of an extremity below the injection site  Hives or difficulty breathing (go to the emergency room)  Inflammation or drainage at the injection site(s)  Severe back/neck pain greater than usual  New symptoms which are concerning to you  Please note:  Although the local anesthetic injected can often make your back or neck feel good for several hours after the injection the pain will likely return.  It takes 3-5 days for steroids to work on the nerve root. You may not notice any pain relief for at least one week.  If effective, we will often do a series of 3 injections spaced 3-6 weeks apart to maximally decrease your pain.    If you have any questions, please call [phone] [facility]

## 2015-03-11 NOTE — Progress Notes (Signed)
Safety precautions to be maintained throughout the outpatient stay will include: orient to surroundings, keep bed in low position, maintain call bell within reach at all times, provide assistance with transfer out of bed and ambulation.  

## 2015-03-11 NOTE — Progress Notes (Signed)
Subjective:    Patient ID: Crystal Roberson, female    DOB: [birth year], 49 y.o.   MRN: [id number]  HPI  Patient is 49 year old female [facility] further evaluation and treatment of pain involving the lower back and lower extremity region predominantly with pain which radiates to the mid back and continues to the neck precipitating headaches. 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 Patient states the pain also interferes with her ability to obtain restful sleep. Patient states that she is desperate and is in hopes of being able to undergo treatment of the severe lower back lower extremity pain immediately. Discussed patient's condition and will proceed with lumbosacral selective nerve root blocks at time return appointment in attempt to decrease severity of patient's symptoms, minimize progression of symptoms, and hopefully avoid the need for more involved treatment. The patient was understanding and agreement status treatment plan.    Review of Systems     Objective:   Physical Exam  Palpation over the splenius capitis and occipitalis musculature regions reproduced moderate discomfort with tenderness over the region of the cervical and thoracic paraspinal musculature regions of moderate degree as well. There was tenderness of the acromioclavicular glenohumeral joint region of mild degree and there was unremarkable Spurling's maneuver. Palpation over the thoracic facet thoracic paraspinal musculature region was with severe spasms of the lower thoracic paraspinal musculature region on the  left as well as on the right. Palpation over the lumbar paraspinal musculature region lumbar facet region was associated with severe pain with lateral bending rotation extension and palpation of the lumbar facets reproducing severe pain. The region of the PSIS and PII S region. There was also tenderness to palpation of the gluteal and piriformis musculature regions. Palpation of these regions reproduced mild to moderate discomfort. There was mild tenderness of the greater trochanteric region and iliotibial band region. Patient had difficulty attempt attempted to stand on tiptoes and heels. No definite sensory deficit of dermatomal distribution was detected. There was negative clonus negative Homans. Severe spasms noted in the lumbar paraspinal musculature region and patient was with severe pain with rotation of the lumbar spine. Abdomen nontender no costovertebral angle tenderness noted.      Assessment & Plan:  Degenerative disc disease lumbar spine Degenerative changes lumbar spine L3 nerve root deviation which may reflect compression of the nerve roots on the left side of the neural foramen and asymmetric bulging L5-S1 to the right of the midline, appears to occur below the level where the nerve seems to pass through the neural foramen and left paracentral disc bulge at L3 that appears to be significantly contributing to patient's symptomatology  Lumbar radiculopathy  Lumbar facet syndrome  Sacroiliac joint dysfunction  Bilateral greater occipital neuralgia    Plan Continue present medications.  Lumbosacral selective nerve root block to be performed at time return appointment  F/U PCP for evaliation of  BP and general medical  condition.  F/U surgical evaluation. Patient will have follow-up surgical evaluation as discussed and as needed  F/U neurological evaluation. We will consider [facility] EMG studies and follow  up neurological evaluation as well pending follow-up assessment of patient's  condition  May consider radiofrequency rhizolysis or intraspinal procedures pending response to present treatment and F/U evaluation.  Patient to call [facility] should patient have concerns prior to scheduled return appointment.

## 2015-03-11 NOTE — Progress Notes (Signed)
Discharged to home ambulatory with script for hydrocodone, tramadol and lidocaine in hand. Pre procedure instructions given with teach back 3 done.

## 2015-03-15 ENCOUNTER — Encounter: Payer: Self-pay | Admitting: Pain Medicine

## 2015-03-15 ENCOUNTER — Ambulatory Visit: Payer: Self-pay | Attending: Pain Medicine | Admitting: Pain Medicine

## 2015-03-15 VITALS — BP 136/81 | HR 64 | Temp 98.1°F | Resp 14 | Ht 65.0 in | Wt 139.0 lb

## 2015-03-15 DIAGNOSIS — M5126 Other intervertebral disc displacement, lumbar region: Secondary | ICD-10-CM | POA: Insufficient documentation

## 2015-03-15 DIAGNOSIS — M5416 Radiculopathy, lumbar region: Secondary | ICD-10-CM

## 2015-03-15 DIAGNOSIS — M47816 Spondylosis without myelopathy or radiculopathy, lumbar region: Secondary | ICD-10-CM | POA: Insufficient documentation

## 2015-03-15 DIAGNOSIS — M5137 Other intervertebral disc degeneration, lumbosacral region: Secondary | ICD-10-CM

## 2015-03-15 DIAGNOSIS — M5136 Other intervertebral disc degeneration, lumbar region: Secondary | ICD-10-CM

## 2015-03-15 DIAGNOSIS — M533 Sacrococcygeal disorders, not elsewhere classified: Secondary | ICD-10-CM

## 2015-03-15 DIAGNOSIS — G588 Other specified mononeuropathies: Secondary | ICD-10-CM

## 2015-03-15 DIAGNOSIS — M5481 Occipital neuralgia: Secondary | ICD-10-CM

## 2015-03-15 MED ORDER — TRIAMCINOLONE ACETONIDE 40 MG/ML IJ SUSP
INTRAMUSCULAR | Status: AC
  Administered 2015-03-15: 10 mg
  Filled 2015-03-15: qty 1

## 2015-03-15 MED ORDER — MIDAZOLAM HCL 5 MG/5ML IJ SOLN
INTRAMUSCULAR | Status: AC
Start: 2015-03-15 — End: 2015-03-15
  Administered 2015-03-15: 5 mg via INTRAVENOUS
  Filled 2015-03-15: qty 5

## 2015-03-15 MED ORDER — FENTANYL CITRATE (PF) 100 MCG/2ML IJ SOLN
INTRAMUSCULAR | Status: AC
  Administered 2015-03-15: 100 ug via INTRAVENOUS
  Filled 2015-03-15: qty 2

## 2015-03-15 MED ORDER — ORPHENADRINE CITRATE 30 MG/ML IJ SOLN
INTRAMUSCULAR | Status: AC
  Administered 2015-03-15: 60 mg
  Filled 2015-03-15: qty 2

## 2015-03-15 MED ORDER — BUPIVACAINE HCL (PF) 0.25 % IJ SOLN
INTRAMUSCULAR | Status: AC
  Administered 2015-03-15: 30 mL
  Filled 2015-03-15: qty 30

## 2015-03-15 NOTE — Progress Notes (Signed)
Safety precautions to be maintained throughout the outpatient stay will include: orient to surroundings, keep bed in low position, maintain call bell within reach at all times, provide assistance with transfer out of bed and ambulation.  

## 2015-03-15 NOTE — Progress Notes (Signed)
Subjective:    Patient ID: Crystal Roberson, female    DOB: 05-15-1966, 49 y.o.   MRN: [id number]  HPI  PROCEDURE PERFORMED: Lumbosacral selective nerve root block   NOTE: The patient is a 49 y.o. female who returns [facility] for further evaluation and treatment of pain involving the lumbar and lower extremity region. Studies consisting of  MRI has revealed the patient to be with evidence of  multilevel degenerative changes lumbar spine with L3 nerve deviation which may reflect compression of the nerve root on the left side of the neural foramen and asymmetric bulging L5-S1 to the right the midline appears to occur below the level where the nerve seems to pass through the neural foramen and left paracentral disc bulge at L3 that appears to be significantly contributing to patient's symptomatology. There is concern regarding intraspinal abnormalities contributing to the patient's symptomatology. The risks, benefits, and expectations of the procedure have been explained to the patient who was understanding and in agreement with suggested treatment plan. We will proceed with interventional treatment as discussed and as explained to the patient. The patient is understanding and in agreement with suggested treatment plan.   DESCRIPTION OF PROCEDURE: Lumbosacral selective nerve root block with IV Versed, IV fentanyl conscious sedation, EKG, blood pressure, pulse, and pulse oximetry monitoring. The procedure was performed with the patient in the prone position under fluoroscopic guidance. With the patient in the prone position, Betadine prep of proposed entry site was performed. Local anesthetic skin wheal of proposed needle entry site was prepared with 1.5% plain lidocaine with AP view of the lumbosacral spine.   PROCEDURE #1: Needle placement at the L 2 vertebral body: A 22 -gauge needle was inserted at the inferior border of the transverse process of the vertebral body with needle placed medial  to the midline of the transverse process on AP view of the lumbosacral spine.    NEEDLE PLACEMENT was accomplished at the L3, L4, and L5 vertebral body levels on the left side utilizing the same technique and under fluoroscopic guidance as was performed on the left side.    Needle placement was then verified on lateral view at all levels with needle tip documented to be in the posterior superior quadrant of the intervertebral foramen of  L  2, L3, L4 , and L5 on the left side. Following negative aspiration for heme and CSF at each level, each level was injected with 3 mL of 0.25% bupivacaine with Kenalog.   LUMBOSACRAL SELECTIVE NERVE ROOT BLOCKS ON THE RIGHT SIDE  The procedure was performed on the right side at exactly the same levels as performed on the left side and under fluoroscopic  Guidance using the same technique is utilized on the left side   The patient tolerated the procedure well. A total of 10 mg of Kenalog was utilized for the procedure.   PLAN:  1. Medications: Will continue presently prescribed medications. 2. The patient is to undergo follow-up evaluation with for evaluation of blood pressure and general medical condition status post procedure performed on today's visit. 3. Surgical follow-up evaluation. 4. Neurological evaluation. 5. May consider radiofrequency procedures, implantation type procedures and other treatment pending response to treatment and follow-up evaluation. 6. The patient has been advise do adhere to proper body mechanics and avoid activities which may aggravate condition. 7. The patient has been advised to call the [facility] prior to scheduled return appointment should there be significant change in the patient's condition  or should the patient have other concerns regarding condition prior to scheduled return appointment.   Review of Systems     Objective:   Physical Exam        Assessment & Plan:

## 2015-03-15 NOTE — Patient Instructions (Addendum)
Continue present medications  F/U PCP for evaliation of  BP and general medical  condition.  F/U surgical evaluation  F/U neurological evaluation  May consider radiofrequency rhizolysis or intraspinal procedures pending response to present treatment and F/U evaluation.  Patient to call [facility] should patient have concerns prior to scheduled return appointment. GENERAL RISKS AND COMPLICATIONS  What are the risk, side effects and possible complications? Generally speaking, most procedures are safe.  However, with any procedure there are risks, side effects, and the possibility of complications.  The risks and complications are dependent upon the sites that are lesioned, or the type of nerve block to be performed.  The closer the procedure is to the spine, the more serious the risks are.  Great care is taken when placing the radio frequency needles, block needles or lesioning probes, but sometimes complications can occur. 1. Infection: Any time there is an injection through the skin, there is a risk of infection.  This is why sterile conditions are used for these blocks.  There are four possible types of infection. 1. Localized skin infection. 2. Central Nervous System Infection-This can be in the form of Meningitis, which can be deadly. 3. Epidural Infections-This can be in the form of an epidural abscess, which can cause pressure inside of the spine, causing compression of the spinal cord with subsequent paralysis. This would require an emergency surgery to decompress, and there are no guarantees that the patient would recover from the paralysis. 4. Discitis-This is an infection of the intervertebral discs.  It occurs in about 1% of discography procedures.  It is difficult to treat and it may lead to surgery.        2. Pain: the needles have to go through skin and soft tissues, will cause soreness.       3. Damage to internal structures:  The nerves to be lesioned may be near blood  vessels or    other nerves which can be potentially damaged.       4. Bleeding: Bleeding is more common if the patient is taking blood thinners such as  aspirin, Coumadin, Ticiid, Plavix, etc., or if he/she have some genetic predisposition  such as hemophilia. Bleeding into the spinal canal can cause compression of the spinal  cord with subsequent paralysis.  This would require an emergency surgery to  decompress and there are no guarantees that the patient would recover from the  paralysis.       5. Pneumothorax:  Puncturing of a lung is a possibility, every time a needle is introduced in  the area of the chest or upper back.  Pneumothorax refers to free air around the  collapsed lung(s), inside of the thoracic cavity (chest cavity).  Another two possible  complications related to a similar event would include: Hemothorax and Chylothorax.   These are variations of the Pneumothorax, where instead of air around the collapsed  lung(s), you may have blood or chyle, respectively.       6. Spinal headaches: They may occur with any procedures in the area of the spine.       7. Persistent CSF (Cerebro-Spinal Fluid) leakage: This is a rare problem, but may occur  with prolonged intrathecal or epidural catheters either due to the formation of a fistulous  track or a dural tear.       8. Nerve damage: By working so close to the spinal cord, there is always a possibility of  nerve damage, which could be   as serious as a permanent spinal cord injury with  paralysis.       9. Death:  Although rare, severe deadly allergic reactions known as "Anaphylactic  reaction" can occur to any of the medications used.      10. Worsening of the symptoms:  We can always make thing worse.  What are the chances of something like this happening? Chances of any of this occuring are extremely low.  By statistics, you have more of a chance of getting killed in a motor vehicle accident: while driving to the hospital than any of the above  occurring .  Nevertheless, you should be aware that they are possibilities.  In general, it is similar to taking a shower.  Everybody knows that you can slip, hit your head and get killed.  Does that mean that you should not shower again?  Nevertheless always keep in mind that statistics do not mean anything if you happen to be on the wrong side of them.  Even if a procedure has a 1 (one) in a 1,000,000 (million) chance of going wrong, it you happen to be that one..Also, keep in mind that by statistics, you have more of a chance of having something go wrong when taking medications.  Who should not have this procedure? If you are on a blood thinning medication (e.g. Coumadin, Plavix, see list of "Blood Thinners"), or if you have an active infection going on, you should not have the procedure.  If you are taking any blood thinners, please inform your physician.  How should I prepare for this procedure?  Do not eat or drink anything at least six hours prior to the procedure.  Bring a driver with you .  It cannot be a taxi.  Come accompanied by an adult that can drive you back, and that is strong enough to help you if your legs get weak or numb from the local anesthetic.  Take all of your medicines the morning of the procedure with just enough water to swallow them.  If you have diabetes, make sure that you are scheduled to have your procedure done first thing in the morning, whenever possible.  If you have diabetes, take only half of your insulin dose and notify our nurse that you have done so as soon as you arrive at the clinic.  If you are diabetic, but only take blood sugar pills (oral hypoglycemic), then do not take them on the morning of your procedure.  You may take them after you have had the procedure.  Do not take aspirin or any aspirin-containing medications, at least eleven (11) days prior to the procedure.  They may prolong bleeding.  Wear loose fitting clothing that may be easy to  take off and that you would not mind if it got stained with Betadine or blood.  Do not wear any jewelry or perfume  Remove any nail coloring.  It will interfere with some of our monitoring equipment.  NOTE: Remember that this is not meant to be interpreted as a complete list of all possible complications.  Unforeseen problems may occur.  BLOOD THINNERS The following drugs contain aspirin or other products, which can cause increased bleeding during surgery and should not be taken for 2 weeks prior to and 1 week after surgery.  If you should need take something for relief of minor pain, you may take acetaminophen which is found in Tylenol,m Datril, Anacin-3 and Panadol. It is not blood thinner. The products listed below   are.  Do not take any of the products listed below in addition to any listed on your instruction sheet.  A.P.C or A.P.C with Codeine Codeine Phosphate Capsules #3 Ibuprofen Ridaura  ABC compound Congesprin Imuran rimadil  Advil Cope Indocin Robaxisal  Alka-Seltzer Effervescent Pain Reliever and Antacid Coricidin or Coricidin-D  Indomethacin Rufen  Alka-Seltzer plus Cold Medicine Cosprin Ketoprofen S-A-C Tablets  Anacin Analgesic Tablets or Capsules Coumadin Korlgesic Salflex  Anacin Extra Strength Analgesic tablets or capsules CP-2 Tablets Lanoril Salicylate  Anaprox Cuprimine Capsules Levenox Salocol  Anexsia-D Dalteparin Magan Salsalate  Anodynos Darvon compound Magnesium Salicylate Sine-off  Ansaid Dasin Capsules Magsal Sodium Salicylate  Anturane Depen Capsules Marnal Soma  APF Arthritis pain formula Dewitt's Pills Measurin Stanback  Argesic Dia-Gesic Meclofenamic Sulfinpyrazone  Arthritis Bayer Timed Release Aspirin Diclofenac Meclomen Sulindac  Arthritis pain formula Anacin Dicumarol Medipren Supac  Analgesic (Safety coated) Arthralgen Diffunasal Mefanamic Suprofen  Arthritis Strength Bufferin Dihydrocodeine Mepro Compound Suprol  Arthropan liquid Dopirydamole  Methcarbomol with Aspirin Synalgos  ASA tablets/Enseals Disalcid Micrainin Tagament  Ascriptin Doan's Midol Talwin  Ascriptin A/D Dolene Mobidin Tanderil  Ascriptin Extra Strength Dolobid Moblgesic Ticlid  Ascriptin with Codeine Doloprin or Doloprin with Codeine Momentum Tolectin  Asperbuf Duoprin Mono-gesic Trendar  Aspergum Duradyne Motrin or Motrin IB Triminicin  Aspirin plain, buffered or enteric coated Durasal Myochrisine Trigesic  Aspirin Suppositories Easprin Nalfon Trillsate  Aspirin with Codeine Ecotrin Regular or Extra Strength Naprosyn Uracel  Atromid-S Efficin Naproxen Ursinus  Auranofin Capsules Elmiron Neocylate Vanquish  Axotal Emagrin Norgesic Verin  Azathioprine Empirin or Empirin with Codeine Normiflo Vitamin E  Azolid Emprazil Nuprin Voltaren  Bayer Aspirin plain, buffered or children's or timed BC Tablets or powders Encaprin Orgaran Warfarin Sodium  Buff-a-Comp Enoxaparin Orudis Zorpin  Buff-a-Comp with Codeine Equegesic Os-Cal-Gesic   Buffaprin Excedrin plain, buffered or Extra Strength Oxalid   Bufferin Arthritis Strength Feldene Oxphenbutazone   Bufferin plain or Extra Strength Feldene Capsules Oxycodone with Aspirin   Bufferin with Codeine Fenoprofen Fenoprofen Pabalate or Pabalate-SF   Buffets II Flogesic Panagesic   Buffinol plain or Extra Strength Florinal or Florinal with Codeine Panwarfarin   Buf-Tabs Flurbiprofen Penicillamine   Butalbital Compound Four-way cold tablets Penicillin   Butazolidin Fragmin Pepto-Bismol   Carbenicillin Geminisyn Percodan   Carna Arthritis Reliever Geopen Persantine   Carprofen Gold's salt Persistin   Chloramphenicol Goody's Phenylbutazone   Chloromycetin Haltrain Piroxlcam   Clmetidine heparin Plaquenil   Cllnoril Hyco-pap Ponstel   Clofibrate Hydroxy chloroquine Propoxyphen         Before stopping any of these medications, be sure to consult the physician who ordered them.  Some, such as Coumadin (Warfarin) are ordered  to prevent or treat serious conditions such as "deep thrombosis", "pumonary embolisms", and other heart problems.  The amount of time that you may need off of the medication may also vary with the medication and the reason for which you were taking it.  If you are taking any of these medications, please make sure you notify your pain physician before you undergo any procedures.         Pain Management Discharge Instructions  General Discharge Instructions :  If you need to reach your doctor call: Monday-Friday 8:00 am - 4:00 pm at [phone] or toll free [phone].  After clinic hours [phone] to have operator reach doctor.  Bring all of your medication bottles to all your appointments in the pain clinic.  To cancel or reschedule your appointment with Pain Management please remember   to call 24 hours in advance to avoid a fee.  Refer to the educational materials which you have been given on: General Risks, I had my Procedure. Discharge Instructions, Post Sedation.  Post Procedure Instructions:  The drugs you were given will stay in your system until tomorrow, so for the next 24 hours you should not drive, make any legal decisions or drink any alcoholic beverages.  You may eat anything you prefer, but it is better to start with liquids then soups and crackers, and gradually work up to solid foods.  Please notify your doctor immediately if you have any unusual bleeding, trouble breathing or pain that is not related to your normal pain.  Depending on the type of procedure that was done, some parts of your body may feel week and/or numb.  This usually clears up by tonight or the next day.  Walk with the use of an assistive device or accompanied by an adult for the 24 hours.  You may use ice on the affected area for the first 24 hours.  Put ice in a Ziploc bag and cover with a towel and place against area 15 minutes on 15 minutes off.  You may switch to heat after 24 hours. 

## 2015-03-16 ENCOUNTER — Telehealth: Payer: Self-pay | Admitting: *Deleted

## 2015-03-16 NOTE — Telephone Encounter (Signed)
Left voice mail to call us back with any problems or concerns.

## 2015-04-13 ENCOUNTER — Ambulatory Visit: Payer: Self-pay | Admitting: Pain Medicine

## 2015-04-13 ENCOUNTER — Telehealth: Payer: Self-pay | Admitting: Pain Medicine

## 2015-04-13 NOTE — Telephone Encounter (Signed)
Needs to resched 04-13-15 appt / wants to get procedure as well as med refill  / please call

## 2015-04-13 NOTE — Telephone Encounter (Signed)
Patient already spoke with Dr. Metta Clinesrisp, scheduled for a procedure on 04-14-15

## 2015-04-14 ENCOUNTER — Ambulatory Visit: Payer: Self-pay | Attending: Pain Medicine | Admitting: Pain Medicine

## 2015-04-14 ENCOUNTER — Encounter: Payer: Self-pay | Admitting: Pain Medicine

## 2015-04-14 VITALS — BP 162/92 | HR 66 | Temp 97.9°F | Resp 16 | Ht 65.0 in | Wt 138.0 lb

## 2015-04-14 DIAGNOSIS — R51 Headache: Secondary | ICD-10-CM | POA: Insufficient documentation

## 2015-04-14 DIAGNOSIS — M62838 Other muscle spasm: Secondary | ICD-10-CM | POA: Insufficient documentation

## 2015-04-14 DIAGNOSIS — M5416 Radiculopathy, lumbar region: Secondary | ICD-10-CM

## 2015-04-14 DIAGNOSIS — M533 Sacrococcygeal disorders, not elsewhere classified: Secondary | ICD-10-CM

## 2015-04-14 DIAGNOSIS — G588 Other specified mononeuropathies: Secondary | ICD-10-CM

## 2015-04-14 DIAGNOSIS — M542 Cervicalgia: Secondary | ICD-10-CM | POA: Insufficient documentation

## 2015-04-14 DIAGNOSIS — M5136 Other intervertebral disc degeneration, lumbar region: Secondary | ICD-10-CM

## 2015-04-14 DIAGNOSIS — M5481 Occipital neuralgia: Secondary | ICD-10-CM

## 2015-04-14 DIAGNOSIS — M5137 Other intervertebral disc degeneration, lumbosacral region: Secondary | ICD-10-CM

## 2015-04-14 DIAGNOSIS — M47816 Spondylosis without myelopathy or radiculopathy, lumbar region: Secondary | ICD-10-CM

## 2015-04-14 MED ORDER — TRAMADOL HCL 50 MG PO TABS: ORAL_TABLET | ORAL | Status: DC

## 2015-04-14 MED ORDER — TRIAMCINOLONE ACETONIDE 40 MG/ML IJ SUSP
INTRAMUSCULAR | Status: AC
  Administered 2015-04-14: 10:00:00
  Filled 2015-04-14: qty 1

## 2015-04-14 MED ORDER — HYDROCODONE-ACETAMINOPHEN 5-325 MG PO TABS: ORAL_TABLET | ORAL | Status: DC

## 2015-04-14 MED ORDER — BUPIVACAINE HCL (PF) 0.25 % IJ SOLN
INTRAMUSCULAR | Status: AC
  Administered 2015-04-14: 10:00:00
  Filled 2015-04-14: qty 30

## 2015-04-14 MED ORDER — MIDAZOLAM HCL 5 MG/5ML IJ SOLN
INTRAMUSCULAR | Status: AC
  Administered 2015-04-14: 3 mg via INTRAVENOUS
  Filled 2015-04-14: qty 5

## 2015-04-14 MED ORDER — ORPHENADRINE CITRATE 30 MG/ML IJ SOLN
INTRAMUSCULAR | Status: AC
Start: 2015-04-14 — End: 2015-04-14
  Administered 2015-04-14: 10:00:00
  Filled 2015-04-14: qty 2

## 2015-04-14 MED ORDER — FENTANYL CITRATE (PF) 100 MCG/2ML IJ SOLN
INTRAMUSCULAR | Status: AC
  Administered 2015-04-14: 100 ug via INTRAVENOUS
  Filled 2015-04-14: qty 2

## 2015-04-14 NOTE — Progress Notes (Signed)
Safety precautions to be maintained throughout the outpatient stay will include: orient to surroundings, keep bed in low position, maintain call bell within reach at all times, provide assistance with transfer out of bed and ambulation.  

## 2015-04-14 NOTE — Patient Instructions (Addendum)
Continue present medications  F/U PCP Dr. Vear ClockPhillips for evaliation of  BP and general medical  condition.  F/U surgical evaluation  F/U neurological evaluation  May consider radiofrequency rhizolysis or intraspinal procedures pending response to present treatment and F/U evaluation.  Patient to call [facility] should patient have concerns prior to scheduled return appointment. Pain Management Discharge Instructions  General Discharge Instructions :  If you need to reach your doctor call: Monday-Friday 8:00 am - 4:00 pm at [phone] or toll free [phone].  After clinic hours [phone] to have operator reach doctor.  Bring all of your medication bottles to all your appointments in the pain clinic.  To cancel or reschedule your appointment with Pain Management please remember to call 24 hours in advance to avoid a fee.  Refer to the educational materials which you have been given on: General Risks, I had my Procedure. Discharge Instructions, Post Sedation.  Post Procedure Instructions:  The drugs you were given will stay in your system until tomorrow, so for the next 24 hours you should not drive, make any legal decisions or drink any alcoholic beverages.  You may eat anything you prefer, but it is better to start with liquids then soups and crackers, and gradually work up to solid foods.  Please notify your doctor immediately if you have any unusual bleeding, trouble breathing or pain that is not related to your normal pain.  Depending on the type of procedure that was done, some parts of your body may feel week and/or numb.  This usually clears up by tonight or the next day.  Walk with the use of an assistive device or accompanied by an adult for the 24 hours.  You may use ice on the affected area for the first 24 hours.  Put ice in a Ziploc bag and cover with a towel and place against area 15 minutes on 15 minutes off.  You may switch to heat after 24  hours.Occipital Nerve Block Patient Information  Description: The occipital nerves originate in the cervical (neck) spinal cord and travel upward through muscle and tissue to supply sensation to the back of the head and top of the scalp.  In addition, the nerves control some of the muscles of the scalp.  Occipital neuralgia is an irritation of these nerves which can cause headaches, numbness of the scalp, and neck discomfort.     The occipital nerve block will interrupt nerve transmission through these nerves and can relieve pain and spasm.  The block consists of insertion of a small needle under the skin in the back of the head to deposit local anesthetic (numbing medicine) and/or steroids around the nerve.  The entire block usually lasts less than 5 minutes.  Conditions which may be treated by occipital blocks:   Muscular pain and spasm of the scalp  Nerve irritation, back of the head  Headaches  Upper neck pain  Preparation for the injection:  1. Do not eat any solid food or dairy products within 6 hours of your appointment. 2. You may drink clear liquids up to 2 hours before appointment.  Clear liquids include water, black coffee, juice or soda.  No milk or cream please. 3. You may take your regular medication, including pain medications, with a sip of water before you appointment.  Diabetics should hold regular insulin (if taken separately) and take 1/2 normal NPH dose the morning of the procedure.  Carry some sugar containing items with you to your appointment. 4.  A driver must accompany you and be prepared to drive you home after your procedure. 5. Bring all your current medications with you. 6. An IV may be inserted and sedation may be given at the discretion of the physician. 7. A blood pressure cuff, EKG, and other monitors will often be applied during the procedure.  Some patients may need to have extra oxygen administered for a short period. 8. You will be asked to provide  medical information, including your allergies and medications, prior to the procedure.  We must know immediately if you are taking blood thinners (like Coumadin/Warfarin) or if you are allergic to IV iodine contrast (dye).  We must know if you could possible be pregnant.  9. Do not wear a high collared shirt or turtleneck.  Tie long hair up in the back if possible.  Possible side-effects:   Bleeding from needle site  Infection (rare, may require surgery)  Nerve injury (rare)  Hair on back of neck can be tinged with iodine scrub (this will wash out)  Light-headedness (temporary)  Pain at injection site (several days)  Decreased blood pressure (rare, temporary)  Seizure (very rare)  Call if you experience:   Hives or difficulty breathing ( go to the emergency room)  Inflammation or drainage at the injection site(s)  Please note:  Although the local anesthetic injected can often make your painful muscles or headache feel good for several hours after the injection, the pain may return.  It takes 3-7 days for steroids to work.  You may not notice any pain relief for at least one week.  If effective, we will often do a series of injections spaced 3-6 weeks apart to maximally decrease your pain.  If you have any questions, please call [phone] [facility]

## 2015-04-14 NOTE — Progress Notes (Signed)
Script in hand for Ultram and Norco

## 2015-04-14 NOTE — Progress Notes (Signed)
   Subjective:    Patient ID: Crystal Roberson, female    DOB: 09-08-1966, 49 y.o.   MRN: [id number]  HPI  NOTE: The patient is a 49 y.o.-year-old female who returns to the [facility] for further evaluation and treatment of pain consisting of pain involving the region of the neck and headache.  Patient is with severe pain radiating from the neck to the back of the head precipitating severe headaches. Patient also is with significant pain and muscle spasms involving the region of the neck as well as the mid back and lower back regions. .  The risks, benefits, and expectations of the procedure have been discussed and explained to patient, who is understanding and wishes to proceed with interventional treatment as discussed and as explained to patient.  Will proceed with greater occipital nerve blocks with myoneural block injections at this time as discussed and as explained to patient.  All are understanding and in agreement with suggested treatment plan.    PROCEDURE:  Greater occipital nerve block on the left side with IV Versed, IV Fentanyl, conscious sedation, EKG, blood pressure, pulse, pulse oximetry monitoring.  Procedure performed with patient in prone position.  Greater occipital nerve block on the left side.   With patient in prone position, Betadine prep of proposed entry site accomplished.  Following identification of the nuchal ridge, 22 -gauge needle was inserted at the level of the nuchal ridge medial to the occipital artery.  Following negative aspiration, 4cc 0.25% bupivacaine with Kenalog injected for left greater occipital nerve block.  Needle was removed.  Patient tolerated injection well.   Greater occipital nerve block on the rightt side. The greater occipital nerve block on the right side was performed exactly as the left greater occipital nerve block was performed and utilizing the same technique   Myoneural block injections of the lumbar musculature region  Following  Betadine prep proposed entry 22-gauge spinal needle was inserted in the region of the lumbar paraspinal musculature region and following negative aspiration for cc of 0.25% bupivacaine with Norflex was injected for myoneural block injections of the lumbar region 2  The patient tolerated the procedure well   A total of 10 mg Kenalog was utilized for the entire procedure.  PLAN:    1. Medications: Will continue presently prescribed medications Norflex tramadol and hydrocodone at this time. 2. Patient to follow up with primary care physician Dr. Vear ClockPhillips for evaluation of blood pressure and general medical condition status post procedure performed on today's visit. 3. Neurological evaluation for further assessment of headaches for further studies as discussed. 4. Surgical evaluation as discussed.  5. Patient may be candidate for Botox injections, radiofrequency procedures, as well as implantation type procedures pending response to treatment rendered on today's visit and pending follow-up evaluation. 6. Patient has been advised to adhere to proper body mechanics and to avoid activities which appear to aggravate condition.cations:  Will continue presently prescribed medications at this time. 7. The patient is understanding and in agreement with the suggested treatment plan.     Review of Systems     Objective:   Physical Exam        Assessment & Plan:

## 2015-04-15 ENCOUNTER — Telehealth: Payer: Self-pay | Admitting: *Deleted

## 2015-04-15 NOTE — Telephone Encounter (Signed)
Left msg

## 2015-04-29 ENCOUNTER — Other Ambulatory Visit: Payer: Self-pay | Admitting: Pain Medicine

## 2015-05-13 ENCOUNTER — Ambulatory Visit: Payer: Self-pay | Attending: Pain Medicine | Admitting: Pain Medicine

## 2015-05-13 ENCOUNTER — Encounter: Payer: Self-pay | Admitting: Pain Medicine

## 2015-05-13 VITALS — BP 172/87 | HR 80 | Temp 98.0°F | Resp 16 | Ht 65.0 in | Wt 139.0 lb

## 2015-05-13 DIAGNOSIS — F329 Major depressive disorder, single episode, unspecified: Secondary | ICD-10-CM | POA: Insufficient documentation

## 2015-05-13 DIAGNOSIS — M5416 Radiculopathy, lumbar region: Secondary | ICD-10-CM

## 2015-05-13 DIAGNOSIS — M51369 Other intervertebral disc degeneration, lumbar region without mention of lumbar back pain or lower extremity pain: Secondary | ICD-10-CM

## 2015-05-13 DIAGNOSIS — M5136 Other intervertebral disc degeneration, lumbar region: Secondary | ICD-10-CM | POA: Insufficient documentation

## 2015-05-13 DIAGNOSIS — M47816 Spondylosis without myelopathy or radiculopathy, lumbar region: Secondary | ICD-10-CM

## 2015-05-13 DIAGNOSIS — G588 Other specified mononeuropathies: Secondary | ICD-10-CM

## 2015-05-13 DIAGNOSIS — M51379 Other intervertebral disc degeneration, lumbosacral region without mention of lumbar back pain or lower extremity pain: Secondary | ICD-10-CM

## 2015-05-13 DIAGNOSIS — M47896 Other spondylosis, lumbar region: Secondary | ICD-10-CM | POA: Insufficient documentation

## 2015-05-13 DIAGNOSIS — M533 Sacrococcygeal disorders, not elsewhere classified: Secondary | ICD-10-CM

## 2015-05-13 DIAGNOSIS — M5137 Other intervertebral disc degeneration, lumbosacral region: Secondary | ICD-10-CM

## 2015-05-13 DIAGNOSIS — M5481 Occipital neuralgia: Secondary | ICD-10-CM

## 2015-05-13 MED ORDER — HYDROCODONE-ACETAMINOPHEN 5-325 MG PO TABS: ORAL_TABLET | ORAL | Status: DC

## 2015-05-13 MED ORDER — TRAMADOL HCL 50 MG PO TABS: ORAL_TABLET | ORAL | Status: DC

## 2015-05-13 NOTE — Progress Notes (Signed)
Patient states she recently had thoughts of suicide, went to "[facility]" in [place] for treatment. Was not admitted as inpatient. Faxed to Medical Records at [facility] at [address], [place] a request for a list of medication that patient states she is taking and medications that was prescribed from that office and any changes. Dr Metta Clines aware and requested the release of information. Patient states "the only med they ordered was prozac". [employer] [phone]

## 2015-05-13 NOTE — Progress Notes (Signed)
  Subjective:    Patient ID: Crystal Roberson, female    DOB: April 09, 1966, 49 y.o.   MRN: [id number]  HPI  Patient is 49 year old female returns to [facility] for further evaluation and treatment of pain involving the neck headaches as well as the upper mid and lower back and lower extremity regions. Patient has had improvement of pain with prior interventional treatment and other treatment of patient's pain. The present time we expressed concern regarding patient's suicidal ideations. Patient was evaluated and treated at [facility]. On today's visit we called the office of [facility] to verify medications and after verification of patient's medications we prescribed patient's medications. We will avoid interventional treatment at this time and will await follow-up evaluation and assessment of patient's condition as planned. Patient will follow-up with [facility] as discussed and as planned and will call [facility] MR should she have any concerns regarding her condition prior to scheduled return appointment should she wish to discuss any issues. The patient was understanding and in agreement with suggested treatment plan.    Review of Systems     Objective:   Physical Exam  There was tenderness of the splenius capitis and occipitalis region of mild degree. There was mild tenderness of the acromioclavicular and glenohumeral joint regions. Patient appeared to be with unremarkable Spurling's maneuver. Patient appeared to be with bilaterally equal grip strength and Tinel and Phalen's maneuver were without increase of pain of significant degree. There was tennis of the cervical facet cervical paraspinal machine as well as the thoracic facet thoracic paraspinal muscles region of mild to moderate degree. No crepitus of the thoracic region was noted. Palpation over the lumbar paraspinal muscles region lumbar facet region was with tinged palpation of mild to moderate degree. Lateral bending and  rotation and extension and palpation of the lumbar facets reproduce moderate discomfort. There was mild to moderate tenderness of the PSIS and PII S regions. Straight leg raising was tolerates approximately 30 without a definite increased pain with dorsiflexion noted There was no definite sensory deficit of dermatomal distribution detected. And there was negative clonus negative Homans. DTRs appear to be trace at the knees. There was negative clonus negative Homans. Abdomen was soft nontender and no costovertebral angle tenderness noted.    Assessment & Plan:   Degenerative disc disease lumbar spine Degenerative changes lumbar spine L3 nerve root deviation which may reflect compression of the nerve roots on the left side of the neural foramen and asymmetric bulging L5-S1 to the right of the midline, appears to occur below the level where the nerve seems to pass through the neural foramen and left paracentral disc bulge at L3 that appears to be significantly contributing to patient's symptomatology  Lumbar radiculopathy  Lumbar facet syndrome  Depression with suicidal ideations and with recent evaluation and treatment at  [facility]  Sacroiliac joint dysfunction  Bilateral greater occipital neuralgia   Plan  Continue present medications tramadol and hydrocodone acetaminophen Flexeril and Lidoderm patch   F/U PCP Dr. Vear Clock for evaliation of  BP depression and to discuss evaluation at [facility] and for evaluation of general medical  condition  F/U surgical evaluation  F/U appointment at [facility] as planned  F/U neurological evaluation  May consider radiofrequency rhizolysis or intraspinal procedures pending response to present treatment and F/U evaluation   Patient to call [facility] Management Center should patient have concerns prior to scheduled return appointmen.

## 2015-05-13 NOTE — Patient Instructions (Addendum)
Continue present medications  F/U PCP for evaliation of  BP and general medical  condition  F/U surgical evaluation  Please keep appointment at [facility] as scheduled and as discussed  F/U neurological evaluation  May consider radiofrequency rhizolysis or intraspinal procedures pending response to present treatment and F/U evaluation   Patient to call Pain Management Center should patient have concerns prior to scheduled return appointmen.

## 2015-05-13 NOTE — Progress Notes (Signed)
Safety precautions to be maintained throughout the outpatient stay will include: orient to surroundings, keep bed in low position, maintain call bell within reach at all times, provide assistance with transfer out of bed and ambulation.  

## 2015-06-01 ENCOUNTER — Telehealth: Payer: Self-pay | Admitting: Pain Medicine

## 2015-06-01 NOTE — Telephone Encounter (Signed)
Having increased pain would like procedure for middle of her back

## 2015-06-01 NOTE — Telephone Encounter (Signed)
Nurses and Olegario Messier Please call patient and described pain to me in further detail Please schedule appointment for Monday, 06/07/2015

## 2015-06-01 NOTE — Telephone Encounter (Signed)
Pain in center of the back, in lower and middle back. Going to accupuncture, not helping.

## 2015-06-01 NOTE — Telephone Encounter (Signed)
Nurses and Olegario Messier Please schedule patient for intercostal nerve blocks Monday, 06/07/2015 at 7:30 AM Please check to see if patient already has an appointment and keep the earliest appointment for patient

## 2015-06-01 NOTE — Telephone Encounter (Signed)
Message left

## 2015-06-02 NOTE — Telephone Encounter (Signed)
Dena Nurses and Morrie Sheldon and Olegario Messier We will probably perform intercostal nerve blocks based on description of patient's pain at this time Please call and inform patient of plan of treatment at this time

## 2015-06-07 ENCOUNTER — Encounter: Payer: Self-pay | Admitting: Pain Medicine

## 2015-06-07 ENCOUNTER — Ambulatory Visit: Payer: Self-pay | Attending: Pain Medicine | Admitting: Pain Medicine

## 2015-06-07 VITALS — BP 163/89 | HR 68 | Temp 98.7°F | Resp 16 | Ht 65.0 in | Wt 141.0 lb

## 2015-06-07 DIAGNOSIS — M5126 Other intervertebral disc displacement, lumbar region: Secondary | ICD-10-CM | POA: Insufficient documentation

## 2015-06-07 DIAGNOSIS — M5416 Radiculopathy, lumbar region: Secondary | ICD-10-CM

## 2015-06-07 DIAGNOSIS — G588 Other specified mononeuropathies: Secondary | ICD-10-CM

## 2015-06-07 DIAGNOSIS — M47816 Spondylosis without myelopathy or radiculopathy, lumbar region: Secondary | ICD-10-CM | POA: Insufficient documentation

## 2015-06-07 DIAGNOSIS — M5137 Other intervertebral disc degeneration, lumbosacral region: Secondary | ICD-10-CM

## 2015-06-07 DIAGNOSIS — M5136 Other intervertebral disc degeneration, lumbar region: Secondary | ICD-10-CM | POA: Insufficient documentation

## 2015-06-07 DIAGNOSIS — M533 Sacrococcygeal disorders, not elsewhere classified: Secondary | ICD-10-CM

## 2015-06-07 DIAGNOSIS — M5481 Occipital neuralgia: Secondary | ICD-10-CM

## 2015-06-07 MED ORDER — FENTANYL CITRATE (PF) 100 MCG/2ML IJ SOLN
INTRAMUSCULAR | Status: AC
  Administered 2015-06-07: 100 ug via INTRAVENOUS
  Filled 2015-06-07: qty 2

## 2015-06-07 MED ORDER — MIDAZOLAM HCL 5 MG/5ML IJ SOLN
INTRAMUSCULAR | Status: AC
  Administered 2015-06-07: 3 mg via INTRAVENOUS
  Filled 2015-06-07: qty 5

## 2015-06-07 MED ORDER — ORPHENADRINE CITRATE 30 MG/ML IJ SOLN
INTRAMUSCULAR | Status: AC
  Administered 2015-06-07: 60 mg
  Filled 2015-06-07: qty 2

## 2015-06-07 MED ORDER — LIDOCAINE HCL (PF) 1 % IJ SOLN
INTRAMUSCULAR | Status: AC
  Administered 2015-06-07: 5 mL
  Filled 2015-06-07: qty 5

## 2015-06-07 MED ORDER — BUPIVACAINE HCL (PF) 0.25 % IJ SOLN
INTRAMUSCULAR | Status: AC
  Filled 2015-06-07: qty 30

## 2015-06-07 MED ORDER — HYDROCODONE-ACETAMINOPHEN 5-325 MG PO TABS: ORAL_TABLET | ORAL | Status: DC

## 2015-06-07 MED ORDER — TRIAMCINOLONE ACETONIDE 40 MG/ML IJ SUSP
INTRAMUSCULAR | Status: AC
  Administered 2015-06-07: 40 mg via INTRAVENOUS
  Filled 2015-06-07: qty 1

## 2015-06-07 MED ORDER — SODIUM CHLORIDE 0.9 % IJ SOLN
INTRAMUSCULAR | Status: AC
  Administered 2015-06-07: 4 mL
  Filled 2015-06-07: qty 10

## 2015-06-07 MED ORDER — CEFAZOLIN SODIUM 1 G IJ SOLR
INTRAMUSCULAR | Status: AC
  Administered 2015-06-07: 1 g via INTRAVENOUS
  Filled 2015-06-07: qty 10

## 2015-06-07 MED ORDER — TRAMADOL HCL 50 MG PO TABS: ORAL_TABLET | ORAL | Status: DC

## 2015-06-07 MED ORDER — CEFUROXIME AXETIL 250 MG PO TABS: 250.0000 mg | ORAL_TABLET | Freq: Two times a day (BID) | ORAL | Status: DC

## 2015-06-07 NOTE — Progress Notes (Signed)
Safety precautions to be maintained throughout the outpatient stay will include: orient to surroundings, keep bed in low position, maintain call bell within reach at all times, provide assistance with transfer out of bed and ambulation.  

## 2015-06-07 NOTE — Patient Instructions (Addendum)
PLAN   Continue present medication Norflex  Ultram  hydrocodone acetaminophen and Lidoderm patches. Please obtain Ceftin antibiotic today and begin taking your Ceftin antibiotic today as discussed   F/U PCP Dr. Vear Clock for evaliation of  BP and general medical  condition  F/U surgical evaluation. May consider pending follow-up evaluations  F/U neurological evaluation. May consider pending follow-up evaluations  May consider radiofrequency rhizolysis or intraspinal procedures pending response to present treatment and F/U evaluation   Patient to call [facility] should patient have concerns prior to scheduled return appointment. Pain Management Discharge Instructions  General Discharge Instructions :  If you need to reach your doctor call: Monday-Friday 8:00 am - 4:00 pm at [phone] or toll free [phone].  After clinic hours [phone] to have operator reach doctor.  Bring all of your medication bottles to all your appointments in the pain clinic.  To cancel or reschedule your appointment with Pain Management please remember to call 24 hours in advance to avoid a fee.  Refer to the educational materials which you have been given on: General Risks, I had my Procedure. Discharge Instructions, Post Sedation.  Post Procedure Instructions:  The drugs you were given will stay in your system until tomorrow, so for the next 24 hours you should not drive, make any legal decisions or drink any alcoholic beverages.  You may eat anything you prefer, but it is better to start with liquids then soups and crackers, and gradually work up to solid foods.  Please notify your doctor immediately if you have any unusual bleeding, trouble breathing or pain that is not related to your normal pain.  Depending on the type of procedure that was done, some parts of your body may feel week and/or numb.  This usually clears up by tonight or the next day.  Walk with the use of an  assistive device or accompanied by an adult for the 24 hours.  You may use ice on the affected area for the first 24 hours.  Put ice in a Ziploc bag and cover with a towel and place against area 15 minutes on 15 minutes off.  You may switch to heat after 24 hours.Intercostal Nerve Block Patient Information  Description: The twelve intercostal nerves arise from the first thru twelfth thoracic nerve roots.  The nerve begins at the spine and wraps around the body, lying in a groove underneath each rib.  Each intercostal nerve innervates a specific strip of skin and body walk of the abdomen and chest.  Therefore, injuries of the chest wall or abdominal wall result in pain that is transmitted back to the brian via the intercostal nerves.  Examples of such injuries include rib fractures and incisions for lung and gall bladder surgery.  Occasionally, pain may persist long after an injury or surgical incision secondary to inflammation and irritation of the intercostal nerve.  The longstanding pain is known as intercostal neuralgia.  An intercostal nerve block is preformed to eliminate pain either temporarily or permanently.  A small needle is placed below the rib and local anesthetic (like Novocaine) and possibly steroid is injected.  Usually 2-4 intercostal nerves are blocked at a time depending on the problem.  The patient will experience a slight "pin-prick" sensation for each injection.  Shortly thereafter, the strip of skin that is innervated by the blocked intercostal nerve will feel numb.  Persistent pain that is only temporarily relieved with local anesthetic may require a more permanent block. This procedure is called  Cryoneurolysis and entails placing a small probe beneath the rib to freeze the nerve.  Conditions that may be treated by intercostal nerve blocks:   Rib fractures  Longstanding pain from surgery of the chest or abdomen (intercostal neuralgia)  Pain from chest tubes  Pain from trauma  to the chest  Preparation for the injections:  1. Do not eat any solid food or dairy products within 6 hours of your appointment. 2. You may drink clear liquids up to 2 hours before appointment.  Clear liquids include water, black coffee, juice or soda.  No milk or cream please. 3. You may take your regular medication, including pain medications, with a sip of water before your appointment.  Diabetics should hold regular insulin (if take separately) and take 1/2 normal NPH dose the morning of the procedure.   Carry some sugar containing items with you to your appointment. 4. A driver must accompany you and be prepared to drive you home after your procedure. 5. Bring all your current medications with you. 6. An IV may be inserted and sedation may be given at the discretion of the physician. 7. A blood pressure cuff, EKG and other monitors will often be applied during the procedure.  Some patients may need to have extra oxygen administered for a short period. 8. You will be asked to provide medical information, including your allergies, prior to the procedure.  We must know immediately if you are taking blood thinners (like Coumadin/Warfarin) or if you are allergic to IV iodine contrast (dye). We must know if you could possible be pregnant.  Possible side-effects:   Bleeding from needle site  Infection (rare)  Nerve injury (rare)  Numbness & tingling of skin  Collapsed lung requiring chest tube (rare)  Local anesthetic toxicity (rare)  Light-headedness (temporary)  Pain at injection site (several days)  Decreased blood pressure (temporary)  Shortness of breath  Jittery/shaking sensation (temporary)  Call if you experience:   Difficulty breathing or hives (go directly to the emergency room)  Redness, inflammation or drainage at the injection site  Severe pain at the site of the injection  Any new symptoms which are concerning   Please note:  Your pain may subside  immediately but may return several hours after the injection.  Often, more than one injection is required to reduce the pain. Also, if several temporary blocks with local anesthetic are ineffective, a more permanent block with cryolysis may be necessary.  This will be discussed with you should this be the case.  If you have any questions, please call [phone] [facility]    Epidural Steroid Injection Patient Information  Description: The epidural space surrounds the nerves as they exit the spinal cord.  In some patients, the nerves can be compressed and inflamed by a bulging disc or a tight spinal canal (spinal stenosis).  By injecting steroids into the epidural space, we can bring irritated nerves into direct contact with a potentially helpful medication.  These steroids act directly on the irritated nerves and can reduce swelling and inflammation which often leads to decreased pain.  Epidural steroids may be injected anywhere along the spine and from the neck to the low back depending upon the location of your pain.   After numbing the skin with local anesthetic (like Novocaine), a small needle is passed into the epidural space slowly.  You may experience a sensation of pressure while this is being done.  The entire block usually last  less than 10 minutes.  Conditions which may be treated by epidural steroids:   Low back and leg pain  Neck and arm pain  Spinal stenosis  Post-laminectomy syndrome  Herpes zoster (shingles) pain  Pain from compression fractures  Preparation for the injection:  1. Do not eat any solid food or dairy products within 6 hours of your appointment.  2. You may drink clear liquids up to 2 hours before appointment.  Clear liquids include water, black coffee, juice or soda.  No milk or cream please. 3. You may take your regular medication, including pain medications, with a sip of water before your appointment  Diabetics  should hold regular insulin (if taken separately) and take 1/2 normal NPH dos the morning of the procedure.  Carry some sugar containing items with you to your appointment. 4. A driver must accompany you and be prepared to drive you home after your procedure.  5. Bring all your current medications with your. 6. An IV may be inserted and sedation may be given at the discretion of the physician.   7. A blood pressure cuff, EKG and other monitors will often be applied during the procedure.  Some patients may need to have extra oxygen administered for a short period. 8. You will be asked to provide medical information, including your allergies, prior to the procedure.  We must know immediately if you are taking blood thinners (like Coumadin/Warfarin)  Or if you are allergic to IV iodine contrast (dye). We must know if you could possible be pregnant.  Possible side-effects:  Bleeding from needle site  Infection (rare, may require surgery)  Nerve injury (rare)  Numbness & tingling (temporary)  Difficulty urinating (rare, temporary)  Spinal headache ( a headache worse with upright posture)  Light -headedness (temporary)  Pain at injection site (several days)  Decreased blood pressure (temporary)  Weakness in arm/leg (temporary)  Pressure sensation in back/neck (temporary)  Call if you experience:  Fever/chills associated with headache or increased back/neck pain.  Headache worsened by an upright position.  New onset weakness or numbness of an extremity below the injection site  Hives or difficulty breathing (go to the emergency room)  Inflammation or drainage at the infection site  Severe back/neck pain  Any new symptoms which are concerning to you  Please note:  Although the local anesthetic injected can often make your back or neck feel good for several hours after the injection, the pain will likely return.  It takes 3-7 days for steroids to work in the epidural space.   You may not notice any pain relief for at least that one week.  If effective, we will often do a series of three injections spaced 3-6 weeks apart to maximally decrease your pain.  After the initial series, we generally will wait several months before considering a repeat injection of the same type.  If you have any questions, please call [phone] [facility]

## 2015-06-07 NOTE — Progress Notes (Signed)
   Subjective:    Patient ID: Crystal Roberson, female    DOB: 1966/05/19, 49 y.o.   MRN: [id number]  HPI  PROCEDURE PERFORMED: Lumbar epidural steroid injection   NOTE: The patient is a 49 y.o. female who returns to [facility] for further evaluation and treatment of pain involving the lumbar and lower extremity region. MRI revealed the patient to be with degenerative disc disease lumbar spine Degenerative changes lumbar spine L3 nerve root deviation which may reflect compression of the nerve roots on the left side of the neural foramen and asymmetric bulging L5-S1 to the right of the midline, appears to occur below the level where the nerve seems to pass through the neural foramen and left paracentral disc bulge at L3. The risks, benefits, and expectations of the procedure have been discussed and explained to the patient who was understanding and in agreement with suggested treatment plan. We will proceed with lumbar epidural steroid injection as discussed and as explained to the patient who is willing to proceed with procedure as planned.   DESCRIPTION OF PROCEDURE: Lumbar epidural steroid injection with IV Versed, IV fentanyl conscious sedation, EKG, blood pressure, pulse, and pulse oximetry monitoring. The procedure was performed with the patient in the prone position under fluoroscopic guidance. A local anesthetic skin wheal of 1.5% plain lidocaine was accomplished at proposed entry site. An 18-gauge Tuohy epidural needle was inserted at the L 4 vertebral body level right of the midline via loss-of-resistance technique with negative heme and negative CSF return. A total of 4 mL of Preservative-Free normal saline with 40 mg of Kenalog injected incrementally via epidurally placed needle. Needle was removed.   Myoneural block injections of the lumbar spine  Following Betadine prep proposed entry site 25-gauge needle inserted in the lumbar paraspinal musculature region and following negative  aspiration 2 cc of 0.25% bupivacaine with Norflex was injected for myoneural block injections of the lumbar region performed 4. Patient tolerated procedure well        A total of 40 mg of Kenalog was utilized for the procedure.   The patient tolerated the injection well.    PLAN:   1. Medications: We will continue presently prescribed medications. 2. Will consider modification of treatment regimen pending response to treatment rendered on today's visit and follow-up evaluation. 3. The patient is to follow-up with primary care physician Dr. Vear Clock regarding blood pressure and general medical condition status post lumbar epidural steroid injection performed on today's visit.. Patient will follow-up Dr. Vear Clock this week for evaluation of blood pressure and general condition is discussed Surgical evaluation. 4. Neurological evaluation. 5. The patient may be a candidate for radiofrequency procedures, implantation device, and other treatment pending response to treatment and follow-up evaluation. 6. The patient has been advised to adhere to proper body mechanics and avoid activities which appear to aggravate condition. 7. The patient has been advised to call the Pain Management Center prior to scheduled return appointment should there be significant change in condition or should there be sign  The patient is understanding and agrees with the suggested  treatment plan      Review of Systems     Objective:   Physical Exam        Assessment & Plan:

## 2015-06-08 ENCOUNTER — Telehealth: Payer: Self-pay

## 2015-06-08 NOTE — Telephone Encounter (Signed)
Left messge.

## 2015-06-10 ENCOUNTER — Encounter: Payer: Self-pay | Admitting: Pain Medicine

## 2015-07-06 ENCOUNTER — Ambulatory Visit: Payer: Self-pay | Attending: Pain Medicine | Admitting: Pain Medicine

## 2015-07-06 ENCOUNTER — Encounter: Payer: Self-pay | Admitting: Pain Medicine

## 2015-07-06 VITALS — BP 173/94 | HR 69 | Temp 98.3°F | Resp 16 | Ht 65.0 in | Wt 141.0 lb

## 2015-07-06 DIAGNOSIS — M5126 Other intervertebral disc displacement, lumbar region: Secondary | ICD-10-CM | POA: Insufficient documentation

## 2015-07-06 DIAGNOSIS — F329 Major depressive disorder, single episode, unspecified: Secondary | ICD-10-CM | POA: Insufficient documentation

## 2015-07-06 DIAGNOSIS — M5137 Other intervertebral disc degeneration, lumbosacral region: Secondary | ICD-10-CM

## 2015-07-06 DIAGNOSIS — M47816 Spondylosis without myelopathy or radiculopathy, lumbar region: Secondary | ICD-10-CM

## 2015-07-06 DIAGNOSIS — M47896 Other spondylosis, lumbar region: Secondary | ICD-10-CM | POA: Insufficient documentation

## 2015-07-06 DIAGNOSIS — M5136 Other intervertebral disc degeneration, lumbar region: Secondary | ICD-10-CM | POA: Insufficient documentation

## 2015-07-06 DIAGNOSIS — M533 Sacrococcygeal disorders, not elsewhere classified: Secondary | ICD-10-CM | POA: Insufficient documentation

## 2015-07-06 DIAGNOSIS — M5416 Radiculopathy, lumbar region: Secondary | ICD-10-CM

## 2015-07-06 DIAGNOSIS — G588 Other specified mononeuropathies: Secondary | ICD-10-CM

## 2015-07-06 DIAGNOSIS — M5481 Occipital neuralgia: Secondary | ICD-10-CM | POA: Insufficient documentation

## 2015-07-06 MED ORDER — ORPHENADRINE CITRATE ER 100 MG PO TB12: ORAL_TABLET | ORAL | Status: DC

## 2015-07-06 MED ORDER — TRAMADOL HCL 50 MG PO TABS: ORAL_TABLET | ORAL | Status: DC

## 2015-07-06 MED ORDER — HYDROCODONE-ACETAMINOPHEN 5-325 MG PO TABS: ORAL_TABLET | ORAL | Status: DC

## 2015-07-06 MED ORDER — MELOXICAM 7.5 MG PO TABS: ORAL_TABLET | ORAL | Status: DC

## 2015-07-06 NOTE — Progress Notes (Signed)
   Subjective:    Patient ID: Crystal Roberson, female    DOB: [birth year], 49 y.o.   MRN: [id number]  HPI Patient is 49 year old female who returns to [facility] for further evaluation and treatment of pain involving the lower back and lower extremities with pain radiating across the buttocks on the left as well as on the right. The patient admits to increased activities around the house and states that she had been doing household chores that she had felt better. We discussed patient's condition and will continue present medications and we'll proceed with block of nerves to the sacroiliac joint at time return appointment in attempt to decrease severity of symptoms minimize progression of symptoms and avoid need for more involved treatment the patient was understanding and in agreement with suggested treatment plan   Review of Systems     Objective:   Physical Exam There was tenderness of the splenius capitis and occipitalis musculature regions of mild degree with mild tenderness of the cervical facet cervical paraspinal musculature regions. There was unremarkable Spurling's maneuver. There was tenderness to palpation over the region of the thoracic paraspinal musculature region of mild to moderate degree with no crepitus of the thoracic region noted. Palpation of the region of the lumbar paraspinal muscles lumbar facet region associated with moderately severe discomfort. Lateral bending and rotation associated with moderate discomfort. There was moderate to moderately severe tenderness to palpation of the PSIS PII S region as well as the gluteal and piriformis musculature regions straight leg raising was tolerates approximately 30 without increased pain with dorsiflexion noted. The patient was with positive Patrick's maneuver with mild tends to palpation of the greater trochanteric region and iliotibial band region associated deficit of dermatomal distribution detected EHL strength appeared to  be decreased there was negative clonus negative Homans abdomen was nontender with no costovertebral tenderness noted       Assessment & Plan:  Degenerative disc disease lumbar spine Degenerative changes lumbar spine L3 nerve root deviation which may reflect compression of the nerve roots on the left side of the neural foramen and asymmetric bulging L5-S1 to the right of the midline, appears to occur below the level where the nerve seems to pass through the neural foramen and left paracentral disc bulge at L3 that appears to be significantly contributing to patient's symptomatology  Lumbar radiculopathy  Lumbar facet syndrome  Depression with suicidal ideations and with recent evaluation and treatment at  [facility]  Sacroiliac joint dysfunction  Bilateral greater occipital neuralgia    PLAN   Continue present medication Zanaflex Mobic Norflex tramadol and hydrocodone acetaminophen  Block of nerves to sacroiliac joint to be performed at time return appointment  F/U PCP Dr. Vear Clock for evaliation of  BP and general medical  condition  F/U surgical evaluation. May consider pending follow-up evaluations  F/U neurological evaluation. May consider pending follow-up evaluations  May consider radiofrequency rhizolysis or intraspinal procedures pending response to present treatment and F/U evaluation   Patient to call Pain Management Center should patient have concerns prior to scheduled return appointment.

## 2015-07-06 NOTE — Progress Notes (Signed)
Safety precautions to be maintained throughout the outpatient stay will include: orient to surroundings, keep bed in low position, maintain call bell within reach at all times, provide assistance with transfer out of bed and ambulation.  

## 2015-07-06 NOTE — Patient Instructions (Addendum)
PLAN   Continue present medication Norflex Zanaflex Mobic hydrocodone and Ultram  Block of nerves to sacroiliac joint to be performed at time return appointment  F/U PCP for evaliation of  BP and general medical  condition  F/U surgical evaluation. May consider pending follow-up evaluations  F/U neurological evaluation. May consider pending follow-up evaluations  May consider radiofrequency rhizolysis or intraspinal procedures pending response to present treatment and F/U evaluation   Patient to call [facility] Management Center should patient have concerns prior to scheduled return appointment. Sacroiliac (SI) Joint Injection Patient Information  Description: The sacroiliac joint connects the scrum (very low back and tailbone) to the ilium (a pelvic bone which also forms half of the hip joint).  Normally this joint experiences very little motion.  When this joint becomes inflamed or unstable low back and or hip and pelvis pain may result.  Injection of this joint with local anesthetics (numbing medicines) and steroids can provide diagnostic information and reduce pain.  This injection is performed with the aid of x-ray guidance into the tailbone area while you are lying on your stomach.   You may experience an electrical sensation down the leg while this is being done.  You may also experience numbness.  We also may ask if we are reproducing your normal pain during the injection.  Conditions which may be treated SI injection:   Low back, buttock, hip or leg pain  Preparation for the Injection:  1. Do not eat any solid food or dairy products within 6 hours of your appointment.  2. You may drink clear liquids up to 2 hours before appointment.  Clear liquids include water, black coffee, juice or soda.  No milk or cream please. 3. You may take your regular medications, including pain medications with a sip of water before your appointment.  Diabetics should hold regular insulin (if take  separately) and take 1/2 normal NPH dose the morning of the procedure.  Carry some sugar containing items with you to your appointment. 4. A driver must accompany you and be prepared to drive you home after your procedure. 5. Bring all of your current medications with you. 6. An IV may be inserted and sedation may be given at the discretion of the physician. 7. A blood pressure cuff, EKG and other monitors will often be applied during the procedure.  Some patients may need to have extra oxygen administered for a short period.  8. You will be asked to provide medical information, including your allergies, prior to the procedure.  We must know immediately if you are taking blood thinners (like Coumadin/Warfarin) or if you are allergic to IV iodine contrast (dye).  We must know if you could possible be pregnant.  Possible side effects:   Bleeding from needle site  Infection (rare, may require surgery)  Nerve injury (rare)  Numbness & tingling (temporary)  A brief convulsion or seizure  Light-headedness (temporary)  Pain at injection site (several days)  Decreased blood pressure (temporary)  Weakness in the leg (temporary)   Call if you experience:   New onset weakness or numbness of an extremity below the injection site that last more than 8 hours.  Hives or difficulty breathing ( go to the emergency room)  Inflammation or drainage at the injection site  Any new symptoms which are concerning to you  Please note:  Although the local anesthetic injected can often make your back/ hip/ buttock/ leg feel good for several hours after the injections, the pain  will likely return.  It takes 3-7 days for steroids to work in the sacroiliac area.  You may not notice any pain relief for at least that one week.  If effective, we will often do a series of three injections spaced 3-6 weeks apart to maximally decrease your pain.  After the initial series, we generally will wait some months  before a repeat injection of the same type.  If you have any questions, please call [phone] [facility]  GENERAL RISKS AND COMPLICATIONS  What are the risk, side effects and possible complications? Generally speaking, most procedures are safe.  However, with any procedure there are risks, side effects, and the possibility of complications.  The risks and complications are dependent upon the sites that are lesioned, or the type of nerve block to be performed.  The closer the procedure is to the spine, the more serious the risks are.  Great care is taken when placing the radio frequency needles, block needles or lesioning probes, but sometimes complications can occur. 1. Infection: Any time there is an injection through the skin, there is a risk of infection.  This is why sterile conditions are used for these blocks.  There are four possible types of infection. 1. Localized skin infection. 2. Central Nervous System Infection-This can be in the form of Meningitis, which can be deadly. 3. Epidural Infections-This can be in the form of an epidural abscess, which can cause pressure inside of the spine, causing compression of the spinal cord with subsequent paralysis. This would require an emergency surgery to decompress, and there are no guarantees that the patient would recover from the paralysis. 4. Discitis-This is an infection of the intervertebral discs.  It occurs in about 1% of discography procedures.  It is difficult to treat and it may lead to surgery.        2. Pain: the needles have to go through skin and soft tissues, will cause soreness.       3. Damage to internal structures:  The nerves to be lesioned may be near blood vessels or    other nerves which can be potentially damaged.       4. Bleeding: Bleeding is more common if the patient is taking blood thinners such as  aspirin, Coumadin, Ticiid, Plavix, etc., or if he/she have some genetic  predisposition  such as hemophilia. Bleeding into the spinal canal can cause compression of the spinal  cord with subsequent paralysis.  This would require an emergency surgery to  decompress and there are no guarantees that the patient would recover from the  paralysis.       5. Pneumothorax:  Puncturing of a lung is a possibility, every time a needle is introduced in  the area of the chest or upper back.  Pneumothorax refers to free air around the  collapsed lung(s), inside of the thoracic cavity (chest cavity).  Another two possible  complications related to a similar event would include: Hemothorax and Chylothorax.   These are variations of the Pneumothorax, where instead of air around the collapsed  lung(s), you may have blood or chyle, respectively.       6. Spinal headaches: They may occur with any procedures in the area of the spine.       7. Persistent CSF (Cerebro-Spinal Fluid) leakage: This is a rare problem, but may occur  with prolonged intrathecal or epidural catheters either due to the formation of a fistulous  track or  a dural tear.       8. Nerve damage: By working so close to the spinal cord, there is always a possibility of  nerve damage, which could be as serious as a permanent spinal cord injury with  paralysis.       9. Death:  Although rare, severe deadly allergic reactions known as "Anaphylactic  reaction" can occur to any of the medications used.      10. Worsening of the symptoms:  We can always make thing worse.  What are the chances of something like this happening? Chances of any of this occuring are extremely low.  By statistics, you have more of a chance of getting killed in a motor vehicle accident: while driving to the hospital than any of the above occurring .  Nevertheless, you should be aware that they are possibilities.  In general, it is similar to taking a shower.  Everybody knows that you can slip, hit your head and get killed.  Does that mean that you should not  shower again?  Nevertheless always keep in mind that statistics do not mean anything if you happen to be on the wrong side of them.  Even if a procedure has a 1 (one) in a 1,000,000 (million) chance of going wrong, it you happen to be that one..Also, keep in mind that by statistics, you have more of a chance of having something go wrong when taking medications.  Who should not have this procedure? If you are on a blood thinning medication (e.g. Coumadin, Plavix, see list of "Blood Thinners"), or if you have an active infection going on, you should not have the procedure.  If you are taking any blood thinners, please inform your physician.  How should I prepare for this procedure?  Do not eat or drink anything at least six hours prior to the procedure.  Bring a driver with you .  It cannot be a taxi.  Come accompanied by an adult that can drive you back, and that is strong enough to help you if your legs get weak or numb from the local anesthetic.  Take all of your medicines the morning of the procedure with just enough water to swallow them.  If you have diabetes, make sure that you are scheduled to have your procedure done first thing in the morning, whenever possible.  If you have diabetes, take only half of your insulin dose and notify our nurse that you have done so as soon as you arrive at the clinic.  If you are diabetic, but only take blood sugar pills (oral hypoglycemic), then do not take them on the morning of your procedure.  You may take them after you have had the procedure.  Do not take aspirin or any aspirin-containing medications, at least eleven (11) days prior to the procedure.  They may prolong bleeding.  Wear loose fitting clothing that may be easy to take off and that you would not mind if it got stained with Betadine or blood.  Do not wear any jewelry or perfume  Remove any nail coloring.  It will interfere with some of our monitoring equipment.  NOTE: Remember that  this is not meant to be interpreted as a complete list of all possible complications.  Unforeseen problems may occur.  BLOOD THINNERS The following drugs contain aspirin or other products, which can cause increased bleeding during surgery and should not be taken for 2 weeks prior to and 1 week after surgery.  If  you should need take something for relief of minor pain, you may take acetaminophen which is found in Tylenol,m Datril, Anacin-3 and Panadol. It is not blood thinner. The products listed below are.  Do not take any of the products listed below in addition to any listed on your instruction sheet.  A.P.C or A.P.C with Codeine Codeine Phosphate Capsules #3 Ibuprofen Ridaura  ABC compound Congesprin Imuran rimadil  Advil Cope Indocin Robaxisal  Alka-Seltzer Effervescent Pain Reliever and Antacid Coricidin or Coricidin-D  Indomethacin Rufen  Alka-Seltzer plus Cold Medicine Cosprin Ketoprofen S-A-C Tablets  Anacin Analgesic Tablets or Capsules Coumadin Korlgesic Salflex  Anacin Extra Strength Analgesic tablets or capsules CP-2 Tablets Lanoril Salicylate  Anaprox Cuprimine Capsules Levenox Salocol  Anexsia-D Dalteparin Magan Salsalate  Anodynos Darvon compound Magnesium Salicylate Sine-off  Ansaid Dasin Capsules Magsal Sodium Salicylate  Anturane Depen Capsules Marnal Soma  APF Arthritis pain formula Dewitt's Pills Measurin Stanback  Argesic Dia-Gesic Meclofenamic Sulfinpyrazone  Arthritis Bayer Timed Release Aspirin Diclofenac Meclomen Sulindac  Arthritis pain formula Anacin Dicumarol Medipren Supac  Analgesic (Safety coated) Arthralgen Diffunasal Mefanamic Suprofen  Arthritis Strength Bufferin Dihydrocodeine Mepro Compound Suprol  Arthropan liquid Dopirydamole Methcarbomol with Aspirin Synalgos  ASA tablets/Enseals Disalcid Micrainin Tagament  Ascriptin Doan's Midol Talwin  Ascriptin A/D Dolene Mobidin Tanderil  Ascriptin Extra Strength Dolobid Moblgesic Ticlid  Ascriptin with Codeine  Doloprin or Doloprin with Codeine Momentum Tolectin  Asperbuf Duoprin Mono-gesic Trendar  Aspergum Duradyne Motrin or Motrin IB Triminicin  Aspirin plain, buffered or enteric coated Durasal Myochrisine Trigesic  Aspirin Suppositories Easprin Nalfon Trillsate  Aspirin with Codeine Ecotrin Regular or Extra Strength Naprosyn Uracel  Atromid-S Efficin Naproxen Ursinus  Auranofin Capsules Elmiron Neocylate Vanquish  Axotal Emagrin Norgesic Verin  Azathioprine Empirin or Empirin with Codeine Normiflo Vitamin E  Azolid Emprazil Nuprin Voltaren  Bayer Aspirin plain, buffered or children's or timed BC Tablets or powders Encaprin Orgaran Warfarin Sodium  Buff-a-Comp Enoxaparin Orudis Zorpin  Buff-a-Comp with Codeine Equegesic Os-Cal-Gesic   Buffaprin Excedrin plain, buffered or Extra Strength Oxalid   Bufferin Arthritis Strength Feldene Oxphenbutazone   Bufferin plain or Extra Strength Feldene Capsules Oxycodone with Aspirin   Bufferin with Codeine Fenoprofen Fenoprofen Pabalate or Pabalate-SF   Buffets II Flogesic Panagesic   Buffinol plain or Extra Strength Florinal or Florinal with Codeine Panwarfarin   Buf-Tabs Flurbiprofen Penicillamine   Butalbital Compound Four-way cold tablets Penicillin   Butazolidin Fragmin Pepto-Bismol   Carbenicillin Geminisyn Percodan   Carna Arthritis Reliever Geopen Persantine   Carprofen Gold's salt Persistin   Chloramphenicol Goody's Phenylbutazone   Chloromycetin Haltrain Piroxlcam   Clmetidine heparin Plaquenil   Cllnoril Hyco-pap Ponstel   Clofibrate Hydroxy chloroquine Propoxyphen         Before stopping any of these medications, be sure to consult the physician who ordered them.  Some, such as Coumadin (Warfarin) are ordered to prevent or treat serious conditions such as "deep thrombosis", "pumonary embolisms", and other heart problems.  The amount of time that you may need off of the medication may also vary with the medication and the reason for which  you were taking it.  If you are taking any of these medications, please make sure you notify your pain physician before you undergo any procedures.

## 2015-07-12 ENCOUNTER — Ambulatory Visit: Payer: Self-pay | Attending: Pain Medicine | Admitting: Pain Medicine

## 2015-07-12 ENCOUNTER — Encounter: Payer: Self-pay | Admitting: Pain Medicine

## 2015-07-12 VITALS — BP 146/67 | HR 62 | Temp 97.0°F | Resp 14 | Ht 65.0 in | Wt 143.0 lb

## 2015-07-12 DIAGNOSIS — M5137 Other intervertebral disc degeneration, lumbosacral region: Secondary | ICD-10-CM

## 2015-07-12 DIAGNOSIS — G588 Other specified mononeuropathies: Secondary | ICD-10-CM

## 2015-07-12 DIAGNOSIS — M533 Sacrococcygeal disorders, not elsewhere classified: Secondary | ICD-10-CM

## 2015-07-12 DIAGNOSIS — M47816 Spondylosis without myelopathy or radiculopathy, lumbar region: Secondary | ICD-10-CM | POA: Insufficient documentation

## 2015-07-12 DIAGNOSIS — M5136 Other intervertebral disc degeneration, lumbar region: Secondary | ICD-10-CM

## 2015-07-12 DIAGNOSIS — M5416 Radiculopathy, lumbar region: Secondary | ICD-10-CM

## 2015-07-12 DIAGNOSIS — M5481 Occipital neuralgia: Secondary | ICD-10-CM

## 2015-07-12 MED ORDER — BUPIVACAINE HCL (PF) 0.25 % IJ SOLN
INTRAMUSCULAR | Status: AC
  Administered 2015-07-12: 13:00:00
  Filled 2015-07-12: qty 30

## 2015-07-12 MED ORDER — MIDAZOLAM HCL 5 MG/5ML IJ SOLN
INTRAMUSCULAR | Status: AC
  Administered 2015-07-12: 5 mg via INTRAVENOUS
  Filled 2015-07-12: qty 5

## 2015-07-12 MED ORDER — FENTANYL CITRATE (PF) 100 MCG/2ML IJ SOLN
INTRAMUSCULAR | Status: AC
  Administered 2015-07-12: 100 ug via INTRAVENOUS
  Filled 2015-07-12: qty 2

## 2015-07-12 MED ORDER — MIDAZOLAM HCL 5 MG/5ML IJ SOLN: 5.0000 mg | Freq: Once | INTRAMUSCULAR | Status: DC

## 2015-07-12 MED ORDER — ORPHENADRINE CITRATE 30 MG/ML IJ SOLN
INTRAMUSCULAR | Status: AC
  Administered 2015-07-12: 13:00:00
  Filled 2015-07-12: qty 2

## 2015-07-12 MED ORDER — BUPIVACAINE HCL (PF) 0.25 % IJ SOLN: 30.0000 mL | Freq: Once | INTRAMUSCULAR | Status: DC

## 2015-07-12 MED ORDER — TRIAMCINOLONE ACETONIDE 40 MG/ML IJ SUSP
INTRAMUSCULAR | Status: AC
  Administered 2015-07-12: 13:00:00
  Filled 2015-07-12: qty 1

## 2015-07-12 MED ORDER — FENTANYL CITRATE (PF) 100 MCG/2ML IJ SOLN: 100.0000 ug | Freq: Once | INTRAMUSCULAR | Status: DC

## 2015-07-12 MED ORDER — TRIAMCINOLONE ACETONIDE 40 MG/ML IJ SUSP: 40.0000 mg | Freq: Once | INTRAMUSCULAR | Status: DC

## 2015-07-12 MED ORDER — LIDOCAINE HCL (PF) 1 % IJ SOLN: 10.0000 mL | Freq: Once | INTRAMUSCULAR | Status: DC

## 2015-07-12 MED ORDER — ORPHENADRINE CITRATE 30 MG/ML IJ SOLN: 60.0000 mg | Freq: Once | INTRAMUSCULAR | Status: DC

## 2015-07-12 MED ORDER — SODIUM CHLORIDE 0.9 % IJ SOLN: 20.0000 mL | Freq: Once | INTRAMUSCULAR | Status: DC

## 2015-07-12 MED ORDER — LACTATED RINGERS IV SOLN: 1000.0000 mL | INTRAVENOUS | Status: DC

## 2015-07-12 NOTE — Progress Notes (Signed)
Subjective:    Patient ID: Crystal Roberson, female    DOB: 1965-12-08, 49 y.o.   MRN: [id number]  HPI  PROCEDURE:  Block of nerves to the sacroiliac joint.   NOTE:  The patient is a 49 y.o. female who returns to the [facility] for further evaluation and treatment of pain involving the lower back and lower extremity region with pain in the region of the buttocks as well. 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  patient is severe pain with palpation of the PSIS PII S region and is with positive Patrick's maneuver. 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 We will proceed with what is felt to be a medically necessary procedure, block of nerves to the sacroiliac joint.   DESCRIPTION OF PROCEDURE:  Block of nerves to the sacroiliac joint.   The patient was taken to the fluoroscopy suite. With the patient in the prone position with EKG, blood pressure, pulse and pulse oximetry monitoring, IV Versed, IV fentanyl conscious sedation, Betadine prep of proposed entry site was performed.   Block of nerves at the L5 vertebral body level.   With the patient in prone position, under fluoroscopic guidance, a 22  -gauge needle was inserted at the L5 vertebral body level on the left side. With 15 degrees oblique orientation a 22 -gauge needle was inserted in the region known as Burton's eye or eye of the Scotty dog. Following documentation of needle placement in the area of Burton's eye or eye of the Scotty dog under fluoroscopic guidance, needle placement was then accomplished at the sacral ala level on the left side.   Needle placement at the sacral ala.   With the patient in prone position under fluoroscopic guidance with AP view of the lumbosacral spine, a 22 -gauge needle was inserted in the region known as the sacral ala on the left side. Following documentation of needle placement on the left side under fluoroscopic guidance needle placement was then accomplished at the S1 foramen level.   Needle placement at the S1 foramen level.   With the patient in prone position under fluoroscopic guidance with AP view of the lumbosacral spine and cephalad orientation, a 22 -gauge needle was inserted at the superior and lateral border of the S1 foramen on the left side. Following documentation of needle placement at the S1 foramen level on the left side, needle placement was then accomplished at the S2 foramen level on the left side.   Needle placement at the S2 foramen level.   With the patient in prone position with AP view of the lumbosacral spine with cephalad orientation, a 22 - gauge needle was inserted at the superior and lateral border of the S2 foramen under fluoroscopic guidance on the left side. Following needle placement  at the L5 vertebral body level, sacral ala, S1 foramen and S2 foramen on the left  side, needle placement was verified on lateral view under fluoroscopic guidance.  Following needle placement documentation on lateral view, each needle was injected with 1 mL of 0.25% bupivacaine and Kenalog.   BLOCK OF THE NERVES TO SACROILIAC JOINT ON THE RIGHT SIDE The procedure was performed on the  right side at the same levels as was performed on the left side and utilizing the same technique as on the left side and was performed under fluoroscopic guidance as on the left side  Myoneural block injections of the gluteal musculature region Following Betadine prep proposed entry site a 20-gauge needle was inserted in the gluteal musculature region and following negative aspiration 1 cc of 0.25% bupivacaine with Norflex was injected for myoneural block injection of the gluteal musculature region 2  The patient tolerated procedure well   A total of  of Kenalog was utilized for the procedure.   PLAN:  1. Medications: The patient will continue presently prescribed medications. Tramadol hydrocodone acetaminophen Mobic and Norflex 2. The patient will be considered for modification of treatment regimen pending response to the procedure performed on today's visit.  3. The patient is to follow-up with primary care physician Dr. Vear Clock for evaluation of blood pressure and general medical condition following the procedure performed on today's visit.  4. Surgical evaluation as discussed.  5. Neurological evaluation as discussed.  6. The patient may be a candidate for radiofrequency procedures, implantation devices and other treatment pending response to treatment performed on today's visit and follow-up evaluation.  7. The patient has been advised to adhere to proper body mechanics and to avoid activities which may exacerbate the patient's symptoms.   Return appointment to [facility] Management Center as scheduled.    Review of Systems     Objective:   Physical Exam        Assessment & Plan:

## 2015-07-12 NOTE — Patient Instructions (Addendum)
PLAN   Continue present medication Mobic Norflex tramadol and hydrocodone acetaminophen  F/U PCP  Dr. Loistine ChancePhilip for evaliation of  BP and general medical  condition  F/U surgical evaluation. May consider pending follow-up evaluations  F/U neurological evaluation. May consider pending follow-up evaluations  May consider radiofrequency rhizolysis or intraspinal procedures pending response to present treatment and F/U evaluation   Patient to call Pain Management Center should patient have concerns prior to scheduled return appointment.  Pain Management Discharge Instructions  General Discharge Instructions :  If you need to reach your doctor call: Monday-Friday 8:00 am - 4:00 pm at [phone] or toll free [phone].  After clinic hours [phone] to have operator reach doctor.  Bring all of your medication bottles to all your appointments in the pain clinic.  To cancel or reschedule your appointment with Pain Management please remember to call 24 hours in advance to avoid a fee.  Refer to the educational materials which you have been given on: General Risks, I had my Procedure. Discharge Instructions, Post Sedation.  Post Procedure Instructions:  The drugs you were given will stay in your system until tomorrow, so for the next 24 hours you should not drive, make any legal decisions or drink any alcoholic beverages.  You may eat anything you prefer, but it is better to start with liquids then soups and crackers, and gradually work up to solid foods.  Please notify your doctor immediately if you have any unusual bleeding, trouble breathing or pain that is not related to your normal pain.  Depending on the type of procedure that was done, some parts of your body may feel week and/or numb.  This usually clears up by tonight or the next day.  Walk with the use of an assistive device or accompanied by an adult for the 24 hours.  You may use ice on the affected area for the first  24 hours.  Put ice in a Ziploc bag and cover with a towel and place against area 15 minutes on 15 minutes off.  You may switch to heat after 24 hours.

## 2015-07-12 NOTE — Progress Notes (Signed)
Safety precautions to be maintained throughout the outpatient stay will include: orient to surroundings, keep bed in low position, maintain call bell within reach at all times, provide assistance with transfer out of bed and ambulation.  

## 2015-07-13 ENCOUNTER — Telehealth: Payer: Self-pay | Admitting: *Deleted

## 2015-07-13 NOTE — Telephone Encounter (Signed)
No answer

## 2015-07-27 ENCOUNTER — Other Ambulatory Visit: Payer: Self-pay | Admitting: Pain Medicine

## 2015-08-05 ENCOUNTER — Ambulatory Visit: Payer: Self-pay | Admitting: Pain Medicine

## 2015-08-17 ENCOUNTER — Encounter: Payer: Self-pay | Admitting: Pain Medicine

## 2015-08-17 ENCOUNTER — Ambulatory Visit: Payer: Self-pay | Attending: Pain Medicine | Admitting: Pain Medicine

## 2015-08-17 VITALS — BP 119/86 | HR 78 | Temp 98.0°F | Resp 16 | Ht 65.0 in | Wt 140.0 lb

## 2015-08-17 DIAGNOSIS — F329 Major depressive disorder, single episode, unspecified: Secondary | ICD-10-CM | POA: Insufficient documentation

## 2015-08-17 DIAGNOSIS — M5126 Other intervertebral disc displacement, lumbar region: Secondary | ICD-10-CM | POA: Insufficient documentation

## 2015-08-17 DIAGNOSIS — M5481 Occipital neuralgia: Secondary | ICD-10-CM | POA: Insufficient documentation

## 2015-08-17 DIAGNOSIS — M5136 Other intervertebral disc degeneration, lumbar region: Secondary | ICD-10-CM

## 2015-08-17 DIAGNOSIS — M533 Sacrococcygeal disorders, not elsewhere classified: Secondary | ICD-10-CM | POA: Insufficient documentation

## 2015-08-17 DIAGNOSIS — M5116 Intervertebral disc disorders with radiculopathy, lumbar region: Secondary | ICD-10-CM | POA: Insufficient documentation

## 2015-08-17 DIAGNOSIS — M47816 Spondylosis without myelopathy or radiculopathy, lumbar region: Secondary | ICD-10-CM

## 2015-08-17 DIAGNOSIS — G588 Other specified mononeuropathies: Secondary | ICD-10-CM

## 2015-08-17 DIAGNOSIS — M5137 Other intervertebral disc degeneration, lumbosacral region: Secondary | ICD-10-CM

## 2015-08-17 DIAGNOSIS — M5416 Radiculopathy, lumbar region: Secondary | ICD-10-CM

## 2015-08-17 MED ORDER — TRAMADOL HCL 50 MG PO TABS: ORAL_TABLET | ORAL | Status: DC

## 2015-08-17 MED ORDER — ORPHENADRINE CITRATE ER 100 MG PO TB12: ORAL_TABLET | ORAL | Status: DC

## 2015-08-17 MED ORDER — MELOXICAM 7.5 MG PO TABS: ORAL_TABLET | ORAL | Status: DC

## 2015-08-17 MED ORDER — HYDROCODONE-ACETAMINOPHEN 5-325 MG PO TABS: ORAL_TABLET | ORAL | Status: DC

## 2015-08-17 MED ORDER — TIZANIDINE HCL 2 MG PO TABS: ORAL_TABLET | ORAL | Status: DC

## 2015-08-17 NOTE — Patient Instructions (Signed)
PLAN   Continue present medication Norflex Zanaflex Mobic hydrocodone and Ultram  F/U PCP for evaliation of  BP and general medical  condition  F/U surgical evaluation. May consider pending follow-up evaluations  F/U neurological evaluation. May consider pending follow-up evaluations  May consider radiofrequency rhizolysis or intraspinal procedures pending response to present treatment and F/U evaluation   Patient to call Pain Management Center should patient have concerns prior to scheduled return appointment.

## 2015-08-17 NOTE — Progress Notes (Signed)
Safety precautions to be maintained throughout the outpatient stay will include: orient to surroundings, keep bed in low position, maintain call bell within reach at all times, provide assistance with transfer out of bed and ambulation.  

## 2015-08-17 NOTE — Progress Notes (Signed)
   Subjective:    Patient ID: Crystal Roberson, female    DOB: August 04, 1966, 49 y.o.   MRN: [id number]  HPI  Patient is a 49 year old female who returns to pain management Center for further evaluation and treatment of pain involving the neck associated with headaches as well as pain involving the upper mid lower back and lower extremity regions. Patient states that her pain is fairly well-controlled at this time and has had significant improvement of prior procedures performed in pain management Center. We will continue presently prescribed medications at this time and will consider patient for additional modifications of treatment regimen should there be significant change in condition prior to scheduled return appointment. The patient is understanding and agreement suggested treatment plan. Patient denies any trauma change in events of daily living the call significant change in symptomatology. The patient states that she is with pain very well controlled at this time and she is looking forward to enjoying the oncoming events since her pain was so well controlled. We will continue to consider modification of treatment regimen regimen pending response to present treatment and follow-up evaluation The patient is understanding and agreement suggested treatment plan    Review of Systems     Objective:   Physical Exam  There was tenderness to palpation of paraspinal muscles and cervical and cervical facet region palpation which reproduces minimal discomfort. There was minimal tense to palpation of the acromioclavicular and glenohumeral joint region. There appeared to be unremarkable Spurling's maneuver. Tinel and Phalen's maneuver were without increase of pain of significant degree. There was minimal tense to palpation over the thoracic facet thoracic paraspinal musculature region. Palpation over the lumbar paraspinal muscles lumbar facet region was with minimal tenderness to palpation as well straight leg  raising was tolerates approximately 30 without increased pain with dorsiflexion noted. There was negative clonus negative Homans. DTRs appeared to be trace at the knees. There was unremarkable Patrick's maneuver. No sensory deficit or dermatomal distribution detected. There was minimal tense to palpation of the greater trochanter region iliotibial band region. There was minimal tenderness to palpation of the PSIS and PII S regions. Abdomen nontender with no costovertebral tenderness noted.      Assessment & Plan:    Degenerative disc disease lumbar spine Degenerative changes lumbar spine L3 nerve root deviation which may reflect compression of the nerve roots on the left side of the neural foramen and asymmetric bulging L5-S1 to the right of the midline, appears to occur below the level where the nerve seems to pass through the neural foramen and left paracentral disc bulge at L3 that appears to be significantly contributing to patient's symptomatology  Lumbar radiculopathy  Lumbar facet syndrome  Depression with suicidal ideations and with recent evaluation and [facility]  Sacroiliac joint dysfunction  Bilateral greater occipital neuralgia     PLAN   Continue present medication Norflex Zanaflex Mobic hydrocodone and Ultram  F/U PCP for evaliation of  BP and general medical  condition  F/U surgical evaluation. May consider pending follow-up evaluations  F/U neurological evaluation. May consider pending follow-up evaluations  May consider radiofrequency rhizolysis or intraspinal procedures pending response to present treatment and F/U evaluation   Patient to call Pain Management Center should patient have concerns prior to scheduled return appointment.

## 2015-09-13 ENCOUNTER — Ambulatory Visit: Payer: Self-pay | Attending: Pain Medicine | Admitting: Pain Medicine

## 2015-09-13 ENCOUNTER — Encounter: Payer: Self-pay | Admitting: Pain Medicine

## 2015-09-13 VITALS — BP 169/85 | HR 81 | Temp 98.3°F | Resp 16 | Ht 60.0 in | Wt 142.0 lb

## 2015-09-13 DIAGNOSIS — G588 Other specified mononeuropathies: Secondary | ICD-10-CM

## 2015-09-13 DIAGNOSIS — M5116 Intervertebral disc disorders with radiculopathy, lumbar region: Secondary | ICD-10-CM | POA: Insufficient documentation

## 2015-09-13 DIAGNOSIS — M533 Sacrococcygeal disorders, not elsewhere classified: Secondary | ICD-10-CM | POA: Insufficient documentation

## 2015-09-13 DIAGNOSIS — M5137 Other intervertebral disc degeneration, lumbosacral region: Secondary | ICD-10-CM

## 2015-09-13 DIAGNOSIS — M5416 Radiculopathy, lumbar region: Secondary | ICD-10-CM

## 2015-09-13 DIAGNOSIS — M5481 Occipital neuralgia: Secondary | ICD-10-CM | POA: Insufficient documentation

## 2015-09-13 DIAGNOSIS — M47816 Spondylosis without myelopathy or radiculopathy, lumbar region: Secondary | ICD-10-CM | POA: Insufficient documentation

## 2015-09-13 DIAGNOSIS — M5126 Other intervertebral disc displacement, lumbar region: Secondary | ICD-10-CM | POA: Insufficient documentation

## 2015-09-13 DIAGNOSIS — M5136 Other intervertebral disc degeneration, lumbar region: Secondary | ICD-10-CM

## 2015-09-13 DIAGNOSIS — F329 Major depressive disorder, single episode, unspecified: Secondary | ICD-10-CM | POA: Insufficient documentation

## 2015-09-13 MED ORDER — HYDROCODONE-ACETAMINOPHEN 5-325 MG PO TABS: ORAL_TABLET | ORAL | Status: DC

## 2015-09-13 MED ORDER — TIZANIDINE HCL 2 MG PO TABS: ORAL_TABLET | ORAL | Status: DC

## 2015-09-13 MED ORDER — ORPHENADRINE CITRATE ER 100 MG PO TB12: ORAL_TABLET | ORAL | Status: DC

## 2015-09-13 MED ORDER — MELOXICAM 7.5 MG PO TABS: ORAL_TABLET | ORAL | Status: DC

## 2015-09-13 MED ORDER — TRAMADOL HCL 50 MG PO TABS: ORAL_TABLET | ORAL | Status: DC

## 2015-09-13 NOTE — Patient Instructions (Signed)
PLAN   Continue present medication Norflex Zanaflex Mobic hydrocodone and Ultram  F/U PCP for evaliation of  BP and general medical  condition  F/U surgical evaluation. May consider pending follow-up evaluations  F/U neurological evaluation. May consider pending follow-up evaluations  May consider radiofrequency rhizolysis or intraspinal procedures pending response to present treatment and F/U evaluation   Patient to call Pain Management Center should patient have concerns prior to scheduled return appointment. 

## 2015-09-13 NOTE — Progress Notes (Signed)
   Subjective:    Patient ID: Crystal Roberson, female    DOB: 03/16/1966, 49 y.o.   MRN: [id number]  HPI The patient is a 49 year old female who returns to pain management Center for further evaluation and treatment of pain involving the region of the neck entire back upper and lower extremity regions. The patient states that the pain present time is fairly well controlled. Patient states that she is able to perform most activities of daily living without experiencing severe disabling pain. Patient admits to some lower back lower extremity pain which she attributes to some increased work which she had done since she felt so good. We will continue presently prescribed medications and will avoid interventional treatment at this time. The patient will call pain management should they be change in condition prior to scheduled return appointment. Patient is understanding and agreed to suggested treatment plan. We will continue Zanaflex Norflex Mobic hydrocodone acetaminophen and tramadol. Patient states that she is with pain fairly well-controlled at this time.       Review of Systems     Objective:   Physical Exam  There was tends to palpation of paraspinal must reason cervical region cervical facet region of mild degree. There was mild tenderness of the splenius capitis and occipitalis musculature regions. Patient appeared to be with bilaterally equal grip strength. Tinel and Phalen's maneuver were without increased pain of significant degree. Palpation over the thoracic region thoracic facet region was with evidence of muscle spasms of mild degree. Patient appeared to be unremarkable Spurling's maneuver. Palpation of the acromioclavicular and glenohumeral joint regions reproduces minimal discomfort. Palpation over the lumbar paraspinal musculatures and lumbar facet region was attends to palpation of mild degree. Lateral bending rotation extension and palpation of the lumbar facets reproduce mild  discomfort. There was mild tenderness to palpation of the PSIS and PII S region as well as the gluteal and piriformis musculature regions. No definite sensory deficit or dermatomal distribution detected. EHL strength appeared to be slightly decreased. There was negative clonus negative Homans. There was moderate muscle spasms i in the lumbar paraspinal musculature region. Palpation over the greater trochanteric region iliotibial band region reproduced moderate discomfort. There was negative clonus negative Homans. EHL strength appeared to be decreased. Abdomen nontender and no costovertebral tenderness noted      Assessment & Plan:     Degenerative disc disease lumbar spine Degenerative changes lumbar spine L3 nerve root deviation which may reflect compression of the nerve roots on the left side of the neural foramen and asymmetric bulging L5-S1 to the right of the midline, appears to occur below the level where the nerve seems to pass through the neural foramen and left paracentral disc bulge at L3 that appears to be significantly contributing to patient's symptomatology  Lumbar radiculopathy  Lumbar facet syndrome  Depression with suicidal ideations and with recent [facility]  Sacroiliac joint dysfunction  Bilateral greater occipital neuralgia     PLAN    Continue present medication Norflex Zanaflex Mobic hydrocodone and Ultram  F/U PCP Dr. Vear ClockPhillips for evaliation of  BP and general medical  condition  F/U surgical evaluation. May consider pending follow-up evaluations  F/U neurological evaluation. May consider pending follow-up evaluations  May consider radiofrequency rhizolysis or intraspinal procedures pending response to present treatment and F/U evaluation   Patient to call Pain Management Center should patient have concerns prior to scheduled return appointment.

## 2015-09-13 NOTE — Progress Notes (Signed)
Safety precautions to be maintained throughout the outpatient stay will include: orient to surroundings, keep bed in low position, maintain call bell within reach at all times, provide assistance with transfer out of bed and ambulation.  

## 2015-10-11 ENCOUNTER — Telehealth: Payer: Self-pay | Admitting: Pain Medicine

## 2015-10-11 NOTE — Telephone Encounter (Signed)
Crystal Roberson and Nurses Please schedule patient for lumbosacral selective nerve root blocks for Wednesday, 10/13/2015 for now Nurses Please call patient to get details of patient's pain to see if we need to modify plan of treatment and also this patient's medications for me

## 2015-10-11 NOTE — Telephone Encounter (Signed)
exptreme lower back pain / would like to come Wednesday for procedure and meds ?

## 2015-10-12 ENCOUNTER — Encounter: Payer: Self-pay | Admitting: Pain Medicine

## 2015-10-12 ENCOUNTER — Ambulatory Visit: Payer: Self-pay | Admitting: Pain Medicine

## 2015-10-12 ENCOUNTER — Telehealth: Payer: Self-pay | Admitting: *Deleted

## 2015-10-12 NOTE — Telephone Encounter (Signed)
patient states she had a telephone conversation with Dr. Metta Clines last night and was instructed to call this morning and have the secretaries put her on the schedule for 10-13-2015 at 7am. Patient given pre procedure instructions and I will confirm appointment with secretaries.

## 2015-10-13 ENCOUNTER — Ambulatory Visit: Payer: Self-pay | Attending: Pain Medicine | Admitting: Pain Medicine

## 2015-10-13 ENCOUNTER — Encounter: Payer: Self-pay | Admitting: Pain Medicine

## 2015-10-13 ENCOUNTER — Telehealth: Payer: Self-pay | Admitting: *Deleted

## 2015-10-13 VITALS — BP 159/85 | HR 69 | Temp 97.2°F | Resp 16 | Ht 65.0 in | Wt 144.0 lb

## 2015-10-13 DIAGNOSIS — G588 Other specified mononeuropathies: Secondary | ICD-10-CM

## 2015-10-13 DIAGNOSIS — M5416 Radiculopathy, lumbar region: Secondary | ICD-10-CM

## 2015-10-13 DIAGNOSIS — M5481 Occipital neuralgia: Secondary | ICD-10-CM

## 2015-10-13 DIAGNOSIS — M5137 Other intervertebral disc degeneration, lumbosacral region: Secondary | ICD-10-CM

## 2015-10-13 DIAGNOSIS — M533 Sacrococcygeal disorders, not elsewhere classified: Secondary | ICD-10-CM

## 2015-10-13 DIAGNOSIS — M5136 Other intervertebral disc degeneration, lumbar region: Secondary | ICD-10-CM | POA: Insufficient documentation

## 2015-10-13 DIAGNOSIS — M47816 Spondylosis without myelopathy or radiculopathy, lumbar region: Secondary | ICD-10-CM | POA: Insufficient documentation

## 2015-10-13 DIAGNOSIS — M5126 Other intervertebral disc displacement, lumbar region: Secondary | ICD-10-CM | POA: Insufficient documentation

## 2015-10-13 MED ORDER — LACTATED RINGERS IV SOLN: 1000.0000 mL | INTRAVENOUS | Status: DC

## 2015-10-13 MED ORDER — TIZANIDINE HCL 2 MG PO TABS: ORAL_TABLET | ORAL | Status: DC

## 2015-10-13 MED ORDER — MIDAZOLAM HCL 5 MG/5ML IJ SOLN
5.0000 mg | Freq: Once | INTRAMUSCULAR | Status: AC
  Administered 2015-10-13: 5 mg via INTRAVENOUS

## 2015-10-13 MED ORDER — TRAMADOL HCL 50 MG PO TABS: ORAL_TABLET | ORAL | Status: DC

## 2015-10-13 MED ORDER — CEFAZOLIN SODIUM 1-5 GM-% IV SOLN: 1.0000 g | Freq: Once | INTRAVENOUS | Status: DC

## 2015-10-13 MED ORDER — FENTANYL CITRATE (PF) 100 MCG/2ML IJ SOLN
100.0000 ug | Freq: Once | INTRAMUSCULAR | Status: AC
  Administered 2015-10-13: 100 ug via INTRAVENOUS

## 2015-10-13 MED ORDER — BUPIVACAINE HCL (PF) 0.25 % IJ SOLN
INTRAMUSCULAR | Status: AC
  Administered 2015-10-13: 30 mL
  Filled 2015-10-13: qty 30

## 2015-10-13 MED ORDER — CEFUROXIME AXETIL 250 MG PO TABS: 250.0000 mg | ORAL_TABLET | Freq: Two times a day (BID) | ORAL | Status: DC

## 2015-10-13 MED ORDER — TRIAMCINOLONE ACETONIDE 40 MG/ML IJ SUSP
INTRAMUSCULAR | Status: AC
  Administered 2015-10-13: 40 mg
  Filled 2015-10-13: qty 1

## 2015-10-13 MED ORDER — BUPIVACAINE HCL (PF) 0.25 % IJ SOLN
30.0000 mL | Freq: Once | INTRAMUSCULAR | Status: AC
  Administered 2015-10-13: 30 mL

## 2015-10-13 MED ORDER — CEFAZOLIN SODIUM 1 G IJ SOLR
INTRAMUSCULAR | Status: AC
Start: 2015-10-13 — End: 2015-10-13
  Administered 2015-10-13: 1 g via INTRAVENOUS
  Filled 2015-10-13: qty 10

## 2015-10-13 MED ORDER — LIDOCAINE HCL (PF) 1 % IJ SOLN
INTRAMUSCULAR | Status: AC
  Administered 2015-10-13: 10 mL via SUBCUTANEOUS
  Filled 2015-10-13: qty 10

## 2015-10-13 MED ORDER — TRIAMCINOLONE ACETONIDE 40 MG/ML IJ SUSP
40.0000 mg | Freq: Once | INTRAMUSCULAR | Status: AC
  Administered 2015-10-13: 40 mg

## 2015-10-13 MED ORDER — LIDOCAINE HCL (PF) 1 % IJ SOLN
10.0000 mL | Freq: Once | INTRAMUSCULAR | Status: AC
  Administered 2015-10-13: 10 mL via SUBCUTANEOUS

## 2015-10-13 MED ORDER — MELOXICAM 7.5 MG PO TABS: ORAL_TABLET | ORAL | Status: DC

## 2015-10-13 MED ORDER — HYDROCODONE-ACETAMINOPHEN 5-325 MG PO TABS: ORAL_TABLET | ORAL | Status: DC

## 2015-10-13 MED ORDER — MIDAZOLAM HCL 5 MG/5ML IJ SOLN
INTRAMUSCULAR | Status: AC
  Administered 2015-10-13: 5 mg via INTRAVENOUS
  Filled 2015-10-13: qty 5

## 2015-10-13 MED ORDER — ORPHENADRINE CITRATE 30 MG/ML IJ SOLN
INTRAMUSCULAR | Status: AC
  Administered 2015-10-13: 60 mg via INTRAMUSCULAR
  Filled 2015-10-13: qty 2

## 2015-10-13 MED ORDER — ORPHENADRINE CITRATE 30 MG/ML IJ SOLN
60.0000 mg | Freq: Once | INTRAMUSCULAR | Status: AC
  Administered 2015-10-13: 60 mg via INTRAMUSCULAR

## 2015-10-13 MED ORDER — FENTANYL CITRATE (PF) 100 MCG/2ML IJ SOLN
INTRAMUSCULAR | Status: AC
  Administered 2015-10-13: 100 ug via INTRAVENOUS
  Filled 2015-10-13: qty 2

## 2015-10-13 NOTE — Progress Notes (Signed)
Subjective:    Patient ID: Crystal Roberson, female    DOB: May 19, 1966, 50 y.o.   MRN: [id number]  HPI PROCEDURE PERFORMED: Lumbosacral selective nerve root block   NOTE: The patient is a 50 y.o. female who returns to Pain Management Center for further evaluation and treatment of pain involving the lumbar and lower extremity region. Studies consisting of MRI has revealed the patient to be with evidence of   Degenerative disc disease lumbar spine Degenerative changes lumbar spine L3 nerve root deviation which may reflect compression of the nerve roots on the left side of the neural foramen and asymmetric bulging L5-S1 to the right of the midline, appears to occur below the level where the nerve seems to pass through the neural foramen and left paracentral disc bulge at L3 that appears to be significantly contributing to patient's symptomatology. There is concern regarding intraspinal abnormalities contributing to the patient's symptomatology. The risks, benefits, and expectations of the procedure have been explained to the patient who was understanding and in agreement with suggested treatment plan. We will proceed with interventional treatment as discussed and as explained to the patient. The patient is understanding and in agreement with suggested treatment plan.   DESCRIPTION OF PROCEDURE: Lumbosacral selective nerve root block with IV Versed, IV fentanyl conscious sedation, EKG, blood pressure, pulse, and pulse oximetry monitoring. The procedure was performed with the patient in the prone position under fluoroscopic guidance. With the patient in the prone position, Betadine prep of proposed entry site was performed. Local anesthetic skin wheal of proposed needle entry site was prepared with 1.5% plain lidocaine with AP view of the lumbosacral spine.   PROCEDURE #1: Needle placement at the left L 2 vertebral body: A 22 -gauge needle was inserted at the inferior border of the transverse process of the  vertebral body with needle placed medial to the midline of the transverse process on AP view of the lumbosacral spine.   NEEDLE PLACEMENT AT  L3, L4, and L5  VERTEBRAL BODY LEVELS  Needle  placement was accomplished at L3, L4, and L5  vertebral body levels on the left side exactly as was accomplished at the L2  vertebral body level  and utilizing the same technique and under fluoroscopic guidance.    Needle placement was then verified on lateral view at all levels with needle tip documented to be in the posterior superior quadrant of the intervertebral foramen of  L 2 L3, L4, and L5. Following negative aspiration for heme and CSF at each level, each level was injected with 3 mL of 0.25% bupivacaine with Kenalog.   LUMBOSACRAL SELECTIVE NERVE ROOT BLOCKS THE THE  RIGHT SIDE  The procedure was performed on the right side exactly as was performed on the left side and at the same levels  Under fluoroscopic guidance and utilizing the same technique.    The patient tolerated the procedure well. A total of 10 mg of Kenalog was utilized for the procedure.   PLAN:  1. Medications: Will continue presently prescribed medications. Norflex Zanaflex Mobic  Ultram and hydrocodone acetaminophen 2. The patient is to undergo follow-up evaluation with PCP Dr. Vear Clock for evaluation of blood pressure and general medical condition status post procedure performed on today's visit. 3. Surgical follow-up evaluation. Has been addressed 4. Neurological evaluation. May consider [facility] EMG studies and other studies 5. May consider radiofrequency procedures, implantation type procedures and other treatment pending response to treatment and follow-up evaluation. 6. The patient has been advise do  adhere to proper body mechanics and avoid activities which may aggravate condition. 7. The patient has been advised to call the Pain Management Center prior to scheduled return appointment should there be significant change in  the patient's condition or should the patient have other concerns regarding condition prior to scheduled return appointment.    Review of Systems     Objective:   Physical Exam        Assessment & Plan:

## 2015-10-13 NOTE — Patient Instructions (Addendum)
PLAN   Continue present medication Norflex Zanaflex Mobic hydrocodone and Ultram. Please get Ceftin antibiotic today and begin taking Ceftin antibiotic today as prescribed  F/U PCP for evaliation of  BP and general medical  condition  F/U surgical evaluation. May consider pending follow-up evaluations  F/U neurological evaluation. May consider pending follow-up evaluations  May consider radiofrequency rhizolysis or intraspinal procedures pending response to present treatment and F/U evaluation   Patient to call [facility] Center should patient have concerns prior to scheduled return appointment.Pain Management Discharge Instructions  General Discharge Instructions :  If you need to reach your doctor call: Monday-Friday 8:00 am - 4:00 pm at [phone] or toll free [phone].  After clinic hours [phone] to have operator reach doctor.  Bring all of your medication bottles to all your appointments in the pain clinic.  To cancel or reschedule your appointment with Pain Management please remember to call 24 hours in advance to avoid a fee.  Refer to the educational materials which you have been given on: General Risks, I had my Procedure. Discharge Instructions, Post Sedation.  Post Procedure Instructions:  The drugs you were given will stay in your system until tomorrow, so for the next 24 hours you should not drive, make any legal decisions or drink any alcoholic beverages.  You may eat anything you prefer, but it is better to start with liquids then soups and crackers, and gradually work up to solid foods.  Please notify your doctor immediately if you have any unusual bleeding, trouble breathing or pain that is not related to your normal pain.  Depending on the type of procedure that was done, some parts of your body may feel week and/or numb.  This usually clears up by tonight or the next day.  Walk with the use of an assistive device or accompanied by an adult for  the 24 hours.  You may use ice on the affected area for the first 24 hours.  Put ice in a Ziploc bag and cover with a towel and place against area 15 minutes on 15 minutes off.  You may switch to heat after 24 hours.GENERAL RISKS AND COMPLICATIONS  What are the risk, side effects and possible complications? Generally speaking, most procedures are safe.  However, with any procedure there are risks, side effects, and the possibility of complications.  The risks and complications are dependent upon the sites that are lesioned, or the type of nerve block to be performed.  The closer the procedure is to the spine, the more serious the risks are.  Great care is taken when placing the radio frequency needles, block needles or lesioning probes, but sometimes complications can occur. 1. Infection: Any time there is an injection through the skin, there is a risk of infection.  This is why sterile conditions are used for these blocks.  There are four possible types of infection. 1. Localized skin infection. 2. Central Nervous System Infection-This can be in the form of Meningitis, which can be deadly. 3. Epidural Infections-This can be in the form of an epidural abscess, which can cause pressure inside of the spine, causing compression of the spinal cord with subsequent paralysis. This would require an emergency surgery to decompress, and there are no guarantees that the patient would recover from the paralysis. 4. Discitis-This is an infection of the intervertebral discs.  It occurs in about 1% of discography procedures.  It is difficult to treat and it may lead to surgery.  2. Pain: the needles have to go through skin and soft tissues, will cause soreness.       3. Damage to internal structures:  The nerves to be lesioned may be near blood vessels or    other nerves which can be potentially damaged.       4. Bleeding: Bleeding is more common if the patient is taking blood thinners such as  aspirin,  Coumadin, Ticiid, Plavix, etc., or if he/she have some genetic predisposition  such as hemophilia. Bleeding into the spinal canal can cause compression of the spinal  cord with subsequent paralysis.  This would require an emergency surgery to  decompress and there are no guarantees that the patient would recover from the  paralysis.       5. Pneumothorax:  Puncturing of a lung is a possibility, every time a needle is introduced in  the area of the chest or upper back.  Pneumothorax refers to free air around the  collapsed lung(s), inside of the thoracic cavity (chest cavity).  Another two possible  complications related to a similar event would include: Hemothorax and Chylothorax.   These are variations of the Pneumothorax, where instead of air around the collapsed  lung(s), you may have blood or chyle, respectively.       6. Spinal headaches: They may occur with any procedures in the area of the spine.       7. Persistent CSF (Cerebro-Spinal Fluid) leakage: This is a rare problem, but may occur  with prolonged intrathecal or epidural catheters either due to the formation of a fistulous  track or a dural tear.       8. Nerve damage: By working so close to the spinal cord, there is always a possibility of  nerve damage, which could be as serious as a permanent spinal cord injury with  paralysis.       9. Death:  Although rare, severe deadly allergic reactions known as "Anaphylactic  reaction" can occur to any of the medications used.      10. Worsening of the symptoms:  We can always make thing worse.  What are the chances of something like this happening? Chances of any of this occuring are extremely low.  By statistics, you have more of a chance of getting killed in a motor vehicle accident: while driving to the hospital than any of the above occurring .  Nevertheless, you should be aware that they are possibilities.  In general, it is similar to taking a shower.  Everybody knows that you can slip, hit  your head and get killed.  Does that mean that you should not shower again?  Nevertheless always keep in mind that statistics do not mean anything if you happen to be on the wrong side of them.  Even if a procedure has a 1 (one) in a 1,000,000 (million) chance of going wrong, it you happen to be that one..Also, keep in mind that by statistics, you have more of a chance of having something go wrong when taking medications.  Who should not have this procedure? If you are on a blood thinning medication (e.g. Coumadin, Plavix, see list of "Blood Thinners"), or if you have an active infection going on, you should not have the procedure.  If you are taking any blood thinners, please inform your physician.  How should I prepare for this procedure?  Do not eat or drink anything at least six hours prior to the procedure.  Bring a driver  with you .  It cannot be a taxi.  Come accompanied by an adult that can drive you back, and that is strong enough to help you if your legs get weak or numb from the local anesthetic.  Take all of your medicines the morning of the procedure with just enough water to swallow them.  If you have diabetes, make sure that you are scheduled to have your procedure done first thing in the morning, whenever possible.  If you have diabetes, take only half of your insulin dose and notify our nurse that you have done so as soon as you arrive at the clinic.  If you are diabetic, but only take blood sugar pills (oral hypoglycemic), then do not take them on the morning of your procedure.  You may take them after you have had the procedure.  Do not take aspirin or any aspirin-containing medications, at least eleven (11) days prior to the procedure.  They may prolong bleeding.  Wear loose fitting clothing that may be easy to take off and that you would not mind if it got stained with Betadine or blood.  Do not wear any jewelry or perfume  Remove any nail coloring.  It will interfere  with some of our monitoring equipment.  NOTE: Remember that this is not meant to be interpreted as a complete list of all possible complications.  Unforeseen problems may occur.  BLOOD THINNERS The following drugs contain aspirin or other products, which can cause increased bleeding during surgery and should not be taken for 2 weeks prior to and 1 week after surgery.  If you should need take something for relief of minor pain, you may take acetaminophen which is found in Tylenol,m Datril, Anacin-3 and Panadol. It is not blood thinner. The products listed below are.  Do not take any of the products listed below in addition to any listed on your instruction sheet.  A.P.C or A.P.C with Codeine Codeine Phosphate Capsules #3 Ibuprofen Ridaura  ABC compound Congesprin Imuran rimadil  Advil Cope Indocin Robaxisal  Alka-Seltzer Effervescent Pain Reliever and Antacid Coricidin or Coricidin-D  Indomethacin Rufen  Alka-Seltzer plus Cold Medicine Cosprin Ketoprofen S-A-C Tablets  Anacin Analgesic Tablets or Capsules Coumadin Korlgesic Salflex  Anacin Extra Strength Analgesic tablets or capsules CP-2 Tablets Lanoril Salicylate  Anaprox Cuprimine Capsules Levenox Salocol  Anexsia-D Dalteparin Magan Salsalate  Anodynos Darvon compound Magnesium Salicylate Sine-off  Ansaid Dasin Capsules Magsal Sodium Salicylate  Anturane Depen Capsules Marnal Soma  APF Arthritis pain formula Dewitt's Pills Measurin Stanback  Argesic Dia-Gesic Meclofenamic Sulfinpyrazone  Arthritis Bayer Timed Release Aspirin Diclofenac Meclomen Sulindac  Arthritis pain formula Anacin Dicumarol Medipren Supac  Analgesic (Safety coated) Arthralgen Diffunasal Mefanamic Suprofen  Arthritis Strength Bufferin Dihydrocodeine Mepro Compound Suprol  Arthropan liquid Dopirydamole Methcarbomol with Aspirin Synalgos  ASA tablets/Enseals Disalcid Micrainin Tagament  Ascriptin Doan's Midol Talwin  Ascriptin A/D Dolene Mobidin Tanderil  Ascriptin  Extra Strength Dolobid Moblgesic Ticlid  Ascriptin with Codeine Doloprin or Doloprin with Codeine Momentum Tolectin  Asperbuf Duoprin Mono-gesic Trendar  Aspergum Duradyne Motrin or Motrin IB Triminicin  Aspirin plain, buffered or enteric coated Durasal Myochrisine Trigesic  Aspirin Suppositories Easprin Nalfon Trillsate  Aspirin with Codeine Ecotrin Regular or Extra Strength Naprosyn Uracel  Atromid-S Efficin Naproxen Ursinus  Auranofin Capsules Elmiron Neocylate Vanquish  Axotal Emagrin Norgesic Verin  Azathioprine Empirin or Empirin with Codeine Normiflo Vitamin E  Azolid Emprazil Nuprin Voltaren  Bayer Aspirin plain, buffered or children's or timed BC Tablets or powders  Encaprin Orgaran Warfarin Sodium  Buff-a-Comp Enoxaparin Orudis Zorpin  Buff-a-Comp with Codeine Equegesic Os-Cal-Gesic   Buffaprin Excedrin plain, buffered or Extra Strength Oxalid   Bufferin Arthritis Strength Feldene Oxphenbutazone   Bufferin plain or Extra Strength Feldene Capsules Oxycodone with Aspirin   Bufferin with Codeine Fenoprofen Fenoprofen Pabalate or Pabalate-SF   Buffets II Flogesic Panagesic   Buffinol plain or Extra Strength Florinal or Florinal with Codeine Panwarfarin   Buf-Tabs Flurbiprofen Penicillamine   Butalbital Compound Four-way cold tablets Penicillin   Butazolidin Fragmin Pepto-Bismol   Carbenicillin Geminisyn Percodan   Carna Arthritis Reliever Geopen Persantine   Carprofen Gold's salt Persistin   Chloramphenicol Goody's Phenylbutazone   Chloromycetin Haltrain Piroxlcam   Clmetidine heparin Plaquenil   Cllnoril Hyco-pap Ponstel   Clofibrate Hydroxy chloroquine Propoxyphen         Before stopping any of these medications, be sure to consult the physician who ordered them.  Some, such as Coumadin (Warfarin) are ordered to prevent or treat serious conditions such as "deep thrombosis", "pumonary embolisms", and other heart problems.  The amount of time that you may need off of the  medication may also vary with the medication and the reason for which you were taking it.  If you are taking any of these medications, please make sure you notify your pain physician before you undergo any procedures.  Prescription given for hydrocodone and tramadol       Selective Nerve Root Block Patient Information  Description: Specific nerve roots exit the spinal canal and these nerves can be compressed and inflamed by a bulging disc and bone spurs.  By injecting steroids on the nerve root, we can potentially decrease the inflammation surrounding these nerves, which often leads to decreased pain.  Also, by injecting local anesthesia on the nerve root, this can provide [place] helpful information to give to your referring doctor if it decreases your pain.  Selective nerve root blocks can be done along the spine from the neck to the low back depending on the location of your pain.   After numbing the skin with local anesthesia, a small needle is passed to the nerve root and the position of the needle is verified using x-ray pictures.  After the needle is in correct position, we then deposit the medication.  You may experience a pressure sensation while this is being done.  The entire block usually lasts less than 15 minutes.  Conditions that may be treated with selective nerve root blocks:  Low back and leg pain  Spinal stenosis  Diagnostic block prior to potential surgery  Neck and arm pain  Post laminectomy syndrome  Preparation for the injection:  1. Do not eat any solid food or dairy products within 6 hours of your appointment. 2. You may drink clear liquids up to 2 hours before an appointment.  Clear liquids include water, black coffee, juice or soda.  No milk or cream please. 3. You may take your regular medications, including pain medications, with a sip of water before your appointment.  Diabetics should hold regular insulin (if taken separately) and take 1/2 normal NPH dose the  morning of the procedure.  Carry some sugar containing items with you to your appointment. 4. A driver must accompany you and be prepared to drive you home after your procedure. 5. Bring all your current medications with you. 6. An IV may be inserted and sedation may be given at the discretion of the physician. 7. A blood pressure cuff, EKG, and  other monitors will often be applied during the procedure.  Some patients may need to have extra oxygen administered for a short period. 8. You will be asked to provide medical information, including allergies, prior to the procedure.  We must know immediately if you are taking blood  Thinners (like Coumadin) or if you are allergic to IV iodine contrast (dye).  Possible side-effects: All are usually temporary  Bleeding from needle site  Light headedness  Numbness and tingling  Decreased blood pressure  Weakness in arms/legs  Pressure sensation in back/neck  Pain at injection site (several days)  Possible complications: All are extremely rare  Infection  Nerve injury  Spinal headache (a headache wore with upright position)  Call if you experience:  Fever/chills associated with headache or increased back/neck pain  Headache worsened by an upright position  New onset weakness or numbness of an extremity below the injection site  Hives or difficulty breathing (go to the emergency room)  Inflammation or drainage at the injection site(s)  Severe back/neck pain greater than usual  New symptoms which are concerning to you  Please note:  Although the local anesthetic injected can often make your back or neck feel good for several hours after the injection the pain will likely return.  It takes 3-5 days for steroids to work on the nerve root. You may not notice any pain relief for at least one week.  If effective, we will often do a series of 3 injections spaced 3-6 weeks apart to maximally decrease your pain.    If you have  any questions, please call [phone] [facility] Discharge Inst[facility]  General Discharge Instructions :  If you need to reach your doctor call: Monday-Friday 8:00 am - 4:00 pm at [phone] or toll free [phone].  After clinic hours [phone] to have operator reach doctor.  Bring all of your medication bottles to all your appointments in the pain clinic.  To cancel or reschedule your appointment with Pain Management please remember to call 24 hours in advance to avoid a fee.  Refer to the educational materials which you have been given on: General Risks, I had my Procedure. Discharge Instructions, Post Sedation.  Post Procedure Instructions:  The drugs you were given will stay in your system until tomorrow, so for the next 24 hours you should not drive, make any legal decisions or drink any alcoholic beverages.  You may eat anything you prefer, but it is better to start with liquids then soups and crackers, and gradually work up to solid foods.  Please notify your doctor immediately if you have any unusual bleeding, trouble breathing or pain that is not related to your normal pain.  Depending on the type of procedure that was done, some parts of your body may feel week and/or numb.  This usually clears up by tonight or the next day.  Walk with the use of an assistive device or accompanied by an adult for the 24 hours.  You may use ice on the affected area for the first 24 hours.  Put ice in a Ziploc bag and cover with a towel and place against area 15 minutes on 15 minutes off.  You may switch to heat after 24 hours.

## 2015-10-13 NOTE — Progress Notes (Signed)
Safety precautions to be maintained throughout the outpatient stay will include: orient to surroundings, keep bed in low position, maintain call bell within reach at all times, provide assistance with transfer out of bed and ambulation.  

## 2015-10-14 ENCOUNTER — Telehealth: Payer: Self-pay | Admitting: *Deleted

## 2015-10-14 NOTE — Telephone Encounter (Signed)
Left voicemail with patient if she has any questions or concerns re; the procedure on yesterday.

## 2015-11-05 ENCOUNTER — Ambulatory Visit (INDEPENDENT_AMBULATORY_CARE_PROVIDER_SITE_OTHER): Payer: Self-pay | Admitting: Family Medicine

## 2015-11-05 ENCOUNTER — Encounter: Payer: Self-pay | Admitting: Family Medicine

## 2015-11-05 ENCOUNTER — Other Ambulatory Visit: Payer: Self-pay

## 2015-11-05 VITALS — BP 172/108 | HR 84 | Temp 98.0°F | Ht 65.5 in | Wt 144.5 lb

## 2015-11-05 DIAGNOSIS — G8929 Other chronic pain: Secondary | ICD-10-CM

## 2015-11-05 DIAGNOSIS — F17210 Nicotine dependence, cigarettes, uncomplicated: Secondary | ICD-10-CM

## 2015-11-05 DIAGNOSIS — Z13220 Encounter for screening for lipoid disorders: Secondary | ICD-10-CM

## 2015-11-05 DIAGNOSIS — F32A Depression, unspecified: Secondary | ICD-10-CM

## 2015-11-05 DIAGNOSIS — I10 Essential (primary) hypertension: Secondary | ICD-10-CM

## 2015-11-05 DIAGNOSIS — G4700 Insomnia, unspecified: Secondary | ICD-10-CM

## 2015-11-05 DIAGNOSIS — Z130 Encounter for screening for diseases of the blood and blood-forming organs and certain disorders involving the immune mechanism: Secondary | ICD-10-CM

## 2015-11-05 DIAGNOSIS — F329 Major depressive disorder, single episode, unspecified: Secondary | ICD-10-CM

## 2015-11-05 LAB — COMPREHENSIVE METABOLIC PANEL
ALBUMIN: 3.9 g/dL (ref 3.5–5.2)
ALT: 13 U/L (ref 0–35)
AST: 16 U/L (ref 0–37)
Alkaline Phosphatase: 92 U/L (ref 39–117)
BUN: 13 mg/dL (ref 6–23)
CHLORIDE: 102 meq/L (ref 96–112)
CO2: 34 meq/L — AB (ref 19–32)
Calcium: 9.6 mg/dL (ref 8.4–10.5)
Creatinine, Ser: 0.76 mg/dL (ref 0.40–1.20)
GFR: 84.93 mL/min (ref >60.00)
Glucose, Bld: 109 mg/dL — ABNORMAL HIGH (ref 70–99)
POTASSIUM: 3 meq/L — AB (ref 3.5–5.1)
SODIUM: 142 meq/L (ref 135–145)
Total Bilirubin: 0.4 mg/dL (ref 0.2–1.2)
Total Protein: 6.8 g/dL (ref 6.0–8.3)

## 2015-11-05 LAB — LIPID PANEL
CHOL/HDL RATIO: 5
CHOLESTEROL: 245 mg/dL — AB (ref 0–200)
HDL: 54.3 mg/dL (ref >39.00)
LDL CALC: 163 mg/dL — AB (ref 0–99)
NonHDL: 190.89
TRIGLYCERIDES: 140 mg/dL (ref 0.0–149.0)
VLDL: 28 mg/dL (ref 0.0–40.0)

## 2015-11-05 MED ORDER — BUPROPION HCL ER (SR) 150 MG PO TB12: ORAL_TABLET | ORAL | Status: DC

## 2015-11-05 MED ORDER — FLUOXETINE HCL 20 MG PO CAPS: 20.0000 mg | ORAL_CAPSULE | Freq: Every day | ORAL | Status: DC

## 2015-11-05 NOTE — Patient Instructions (Signed)
We will call with your lab results.  Consider mammogram, colonoscopy, tdap, pneumonia shot.  Take the wellbutrin as prescribed.  Take care  Dr. Lacinda Axon   Health Maintenance, Female Adopting a healthy lifestyle and getting preventive care can go a long way to promote health and wellness. Talk with your health care provider about what schedule of regular examinations is right for you. This is a good chance for you to check in with your provider about disease prevention and staying healthy. In between checkups, there are plenty of things you can do on your own. Experts have done a lot of research about which lifestyle changes and preventive measures are most likely to keep you healthy. Ask your health care provider for more information. WEIGHT AND DIET  Eat a healthy diet  Be sure to include plenty of vegetables, fruits, low-fat dairy products, and lean protein.  Do not eat a lot of foods high in solid fats, added sugars, or salt.  Get regular exercise. This is one of the most important things you can do for your health.  Most adults should exercise for at least 150 minutes each week. The exercise should increase your heart rate and make you sweat (moderate-intensity exercise).  Most adults should also do strengthening exercises at least twice a week. This is in addition to the moderate-intensity exercise.  Maintain a healthy weight  Body mass index (BMI) is a measurement that can be used to identify possible weight problems. It estimates body fat based on height and weight. Your health care provider can help determine your BMI and help you achieve or maintain a healthy weight.  For females 74 years of age and older:   A BMI below 18.5 is considered underweight.  A BMI of 18.5 to 24.9 is normal.  A BMI of 25 to 29.9 is considered overweight.  A BMI of 30 and above is considered obese.  Watch levels of cholesterol and blood lipids  You should start having your blood tested for  lipids and cholesterol at 52 years of age, then have this test every 5 years.  You may need to have your cholesterol levels checked more often if:  Your lipid or cholesterol levels are high.  You are older than 52 years of age.  You are at high risk for heart disease.  CANCER SCREENING   Lung Cancer  Lung cancer screening is recommended for adults 53-26 years old who are at high risk for lung cancer because of a history of smoking.  A yearly low-dose CT scan of the lungs is recommended for people who:  Currently smoke.  Have quit within the past 15 years.  Have at least a 30-pack-year history of smoking. A pack year is smoking an average of one pack of cigarettes a day for 1 year.  Yearly screening should continue until it has been 15 years since you quit.  Yearly screening should stop if you develop a health problem that would prevent you from having lung cancer treatment.  Breast Cancer  Practice breast self-awareness. This means understanding how your breasts normally appear and feel.  It also means doing regular breast self-exams. Let your health care provider know about any changes, no matter how small.  If you are in your 20s or 30s, you should have a clinical breast exam (CBE) by a health care provider every 1-3 years as part of a regular health exam.  If you are 33 or older, have a CBE every year. Also consider having  a breast X-ray (mammogram) every year.  If you have a family history of breast cancer, talk to your health care provider about genetic screening.  If you are at high risk for breast cancer, talk to your health care provider about having an MRI and a mammogram every year.  Breast cancer gene (BRCA) assessment is recommended for women who have family members with BRCA-related cancers. BRCA-related cancers include:  Breast.  Ovarian.  Tubal.  Peritoneal cancers.  Results of the assessment will determine the need for genetic counseling and BRCA1  and BRCA2 testing. Cervical Cancer Your health care provider may recommend that you be screened regularly for cancer of the pelvic organs (ovaries, uterus, and vagina). This screening involves a pelvic examination, including checking for microscopic changes to the surface of your cervix (Pap test). You may be encouraged to have this screening done every 3 years, beginning at age 77.  For women ages 82-65, health care providers may recommend pelvic exams and Pap testing every 3 years, or they may recommend the Pap and pelvic exam, combined with testing for human papilloma virus (HPV), every 5 years. Some types of HPV increase your risk of cervical cancer. Testing for HPV may also be done on women of any age with unclear Pap test results.  Other health care providers may not recommend any screening for nonpregnant women who are considered low risk for pelvic cancer and who do not have symptoms. Ask your health care provider if a screening pelvic exam is right for you.  If you have had past treatment for cervical cancer or a condition that could lead to cancer, you need Pap tests and screening for cancer for at least 20 years after your treatment. If Pap tests have been discontinued, your risk factors (such as having a new sexual partner) need to be reassessed to determine if screening should resume. Some women have medical problems that increase the chance of getting cervical cancer. In these cases, your health care provider may recommend more frequent screening and Pap tests. Colorectal Cancer  This type of cancer can be detected and often prevented.  Routine colorectal cancer screening usually begins at 52 years of age and continues through 52 years of age.  Your health care provider may recommend screening at an earlier age if you have risk factors for colon cancer.  Your health care provider may also recommend using home test kits to check for hidden blood in the stool.  A small camera at the  end of a tube can be used to examine your colon directly (sigmoidoscopy or colonoscopy). This is done to check for the earliest forms of colorectal cancer.  Routine screening usually begins at age 86.  Direct examination of the colon should be repeated every 5-10 years through 52 years of age. However, you may need to be screened more often if early forms of precancerous polyps or small growths are found. Skin Cancer  Check your skin from head to toe regularly.  Tell your health care provider about any new moles or changes in moles, especially if there is a change in a mole's shape or color.  Also tell your health care provider if you have a mole that is larger than the size of a pencil eraser.  Always use sunscreen. Apply sunscreen liberally and repeatedly throughout the day.  Protect yourself by wearing long sleeves, pants, a wide-brimmed hat, and sunglasses whenever you are outside. HEART DISEASE, DIABETES, AND HIGH BLOOD PRESSURE   High blood pressure  causes heart disease and increases the risk of stroke. High blood pressure is more likely to develop in:  People who have blood pressure in the high end of the normal range (130-139/85-89 mm Hg).  People who are overweight or obese.  People who are African American.  If you are 60-70 years of age, have your blood pressure checked every 3-5 years. If you are 66 years of age or older, have your blood pressure checked every year. You should have your blood pressure measured twice--once when you are at a hospital or clinic, and once when you are not at a hospital or clinic. Record the average of the two measurements. To check your blood pressure when you are not at a hospital or clinic, you can use:  An automated blood pressure machine at a pharmacy.  A home blood pressure monitor.  If you are between 32 years and 79 years old, ask your health care provider if you should take aspirin to prevent strokes.  Have regular diabetes  screenings. This involves taking a blood sample to check your fasting blood sugar level.  If you are at a normal weight and have a low risk for diabetes, have this test once every three years after 52 years of age.  If you are overweight and have a high risk for diabetes, consider being tested at a younger age or more often. PREVENTING INFECTION  Hepatitis B  If you have a higher risk for hepatitis B, you should be screened for this virus. You are considered at high risk for hepatitis B if:  You were born in a country where hepatitis B is common. Ask your health care provider which countries are considered high risk.  Your parents were born in a high-risk country, and you have not been immunized against hepatitis B (hepatitis B vaccine).  You have HIV or AIDS.  You use needles to inject street drugs.  You live with someone who has hepatitis B.  You have had sex with someone who has hepatitis B.  You get hemodialysis treatment.  You take certain medicines for conditions, including cancer, organ transplantation, and autoimmune conditions. Hepatitis C  Blood testing is recommended for:  Everyone born from [birth year] through 1967.  Anyone with known risk factors for hepatitis C. Sexually transmitted infections (STIs)  You should be screened for sexually transmitted infections (STIs) including gonorrhea and chlamydia if:  You are sexually active and are younger than 52 years of age.  You are older than 52 years of age and your health care provider tells you that you are at risk for this type of infection.  Your sexual activity has changed since you were last screened and you are at an increased risk for chlamydia or gonorrhea. Ask your health care provider if you are at risk.  If you do not have HIV, but are at risk, it may be recommended that you take a prescription medicine daily to prevent HIV infection. This is called pre-exposure prophylaxis (PrEP). You are considered at risk  if:  You are sexually active and do not regularly use condoms or know the HIV status of your partner(s).  You take drugs by injection.  You are sexually active with a partner who has HIV. Talk with your health care provider about whether you are at high risk of being infected with HIV. If you choose to begin PrEP, you should first be tested for HIV. You should then be tested every 3 months for as long as you  are taking PrEP.  PREGNANCY   If you are premenopausal and you may become pregnant, ask your health care provider about preconception counseling.  If you may become pregnant, take 400 to 800 micrograms (mcg) of folic acid every day.  If you want to prevent pregnancy, talk to your health care provider about birth control (contraception). OSTEOPOROSIS AND MENOPAUSE   Osteoporosis is a disease in which the bones lose minerals and strength with aging. This can result in serious bone fractures. Your risk for osteoporosis can be identified using a bone density scan.  If you are 73 years of age or older, or if you are at risk for osteoporosis and fractures, ask your health care provider if you should be screened.  Ask your health care provider whether you should take a calcium or vitamin D supplement to lower your risk for osteoporosis.  Menopause may have certain physical symptoms and risks.  Hormone replacement therapy may reduce some of these symptoms and risks. Talk to your health care provider about whether hormone replacement therapy is right for you.  HOME CARE INSTRUCTIONS   Schedule regular health, dental, and eye exams.  Stay current with your immunizations.   Do not use any tobacco products including cigarettes, chewing tobacco, or electronic cigarettes.  If you are pregnant, do not drink alcohol.  If you are breastfeeding, limit how much and how often you drink alcohol.  Limit alcohol intake to no more than 1 drink per day for nonpregnant women. One drink equals 12  ounces of beer, 5 ounces of wine, or 1 ounces of hard liquor.  Do not use street drugs.  Do not share needles.  Ask your health care provider for help if you need support or information about quitting drugs.  Tell your health care provider if you often feel depressed.  Tell your health care provider if you have ever been abused or do not feel safe at home.   This information is not intended to replace advice given to you by your health care provider. Make sure you discuss any questions you have with your health care provider.   Document Released: 03/27/2011 Document Revised: 10/02/2014 Document Reviewed: 08/13/2013 Elsevier Interactive Patient Education [employer].

## 2015-11-05 NOTE — Telephone Encounter (Signed)
Pt is requesting a refill on Ambien. All her other meds were refilled except for the Ambien. Please advise, thanks

## 2015-11-05 NOTE — Progress Notes (Signed)
Subjective:  Patient ID: Crystal Roberson, female    DOB: 04-Nov-1965  Age: 50 y.o. MRN: [id number]  CC: Establish care  HPI Crystal Roberson is a 50 y.o. female presents to the clinic today to establish care.   HTN  Uncontrolled. Likely secondary to pain and being out of medication.  She is currently on Lisinopril/HCTZ.  Chronic pain  Followed closely by Dr. Primus Bravo.  Is on chronic narcotics and muscle relaxants.  Fairly well controlled.  Tobacco abuse  Interested in quitting. Will discus today.  Insomnia  Stable on Ambien.  In need of refill.   Depression  W/ history of SI previously.  Stable on Prozac.  PMH, Surgical Hx, Family Hx, Social History reviewed and updated as below.  Past Medical History  Diagnosis Date  . Depression   . Hypertension   . Allergy   . Chicken pox   . Migraines    Past Surgical History  Procedure Laterality Date  . Tubal ligation    . Cervical disc surgery     Family History  Problem Relation Age of Onset  . Hypertension Mother   . Arthritis Mother   . Hyperlipidemia Mother   . Mental illness Mother   . Hypertension Maternal Grandmother   . Heart disease Maternal Grandmother    Social History  Substance Use Topics  . Smoking status: Current Every Day Smoker -- 30 years    Types: Cigarettes  . Smokeless tobacco: Never Used  . Alcohol Use: No   Review of Systems  Eyes:       Trouble seeing (needs new glasses).  Gastrointestinal: Positive for constipation.  Endocrine: Positive for polyphagia.  Musculoskeletal:       Muscle spasm.  Neurological: Positive for weakness, numbness and headaches.  Psychiatric/Behavioral:       Sadness, anxiety, stress.  All other systems reviewed and are negative.   Objective:   Today's Vitals: BP 172/108 mmHg  Pulse 84  Temp(Src) 98 F (36.7 C) (Oral)  Ht 5' 5.5" (1.664 m)  Wt 144 lb 8 oz (65.545 kg)  BMI 23.67 kg/m2  SpO2 97%  Physical Exam  Constitutional: She is oriented to  person, place, and time. She appears well-developed. No distress.  HENT:  Head: Normocephalic and atraumatic.  Mouth/Throat: Oropharynx is clear and moist.  Eyes: Conjunctivae are normal. No scleral icterus.  Neck: Neck supple.  Cardiovascular: Normal rate and regular rhythm.   Pulmonary/Chest: Effort normal and breath sounds normal. No respiratory distress. She has no wheezes. She has no rales.  Abdominal: Soft. She exhibits no distension. There is no tenderness. There is no rebound and no guarding.  Musculoskeletal:  Decreased ROM of cervical spine and thoracic spine.  Neurological: She is alert and oriented to person, place, and time.  Skin: Skin is warm and dry.  Psychiatric: She has a normal mood and affect.  Vitals reviewed.  Assessment & Plan:   Problem List Items Addressed This Visit    Nicotine dependence    Tobacco cessation counseling  Ask: Daily smoker (~1/2 ppd).  Advise: I advised her to quit today.   Assess: Patient open and willing to quit.  Assist: Discussed treatment options today - Nicotine replacement, Chantix, Wellbutrin. Patient would like to try wellbutrin. Rx sent today.  Arrange follow up: patient to follow up in 1-3 months.   Time spent: 5 mins         Insomnia    Stable. Refilling Ambien.  Essential hypertension - Primary    Uncontrolled. ? Due to pain. BMP today. Refilling Lisinopril/HCTZ. Follow up BP check in 2 weeks.       Relevant Medications   lisinopril-hydrochlorothiazide (PRINZIDE,ZESTORETIC) 20-25 MG tablet   Other Relevant Orders   Comp Met (CMET) (Completed)   Depression    Stable on Prozac. Adding Wellbutrin for tobacco cessation today. Prozac refilled.       Relevant Medications   buPROPion (WELLBUTRIN SR) 150 MG 12 hr tablet   FLUoxetine (PROZAC) 20 MG capsule   Chronic pain    Followed by Pain management. On chronic narcotics. Will defer Rx's to Dr. Primus Bravo.      Relevant Medications   buPROPion  (WELLBUTRIN SR) 150 MG 12 hr tablet   FLUoxetine (PROZAC) 20 MG capsule    Other Visit Diagnoses    Screening for deficiency anemia        Relevant Orders    CBC (Completed)    Screening, lipid        Relevant Orders    Lipid Profile (Completed)       Follow-up: 2 weeks  Hilda

## 2015-11-05 NOTE — Progress Notes (Signed)
Pre visit review using our clinic review tool, if applicable. No additional management support is needed unless otherwise documented below in the visit note. 

## 2015-11-06 LAB — CBC
HEMATOCRIT: 42.8 % (ref 36.0–46.0)
Hemoglobin: 14.2 g/dL (ref 12.0–15.0)
MCHC: 33.2 g/dL (ref 30.0–36.0)
MCV: 94.1 fl (ref 78.0–100.0)
Platelets: 218 10*3/uL (ref 150.0–400.0)
RBC: 4.55 Mil/uL (ref 3.87–5.11)
RDW: 12.7 % (ref 11.5–15.5)
WBC: 8 10*3/uL (ref 4.0–10.5)

## 2015-11-07 ENCOUNTER — Other Ambulatory Visit: Payer: Self-pay | Admitting: Family Medicine

## 2015-11-07 ENCOUNTER — Encounter: Payer: Self-pay | Admitting: Family Medicine

## 2015-11-07 DIAGNOSIS — F329 Major depressive disorder, single episode, unspecified: Secondary | ICD-10-CM | POA: Insufficient documentation

## 2015-11-07 DIAGNOSIS — I10 Essential (primary) hypertension: Secondary | ICD-10-CM | POA: Insufficient documentation

## 2015-11-07 DIAGNOSIS — G4700 Insomnia, unspecified: Secondary | ICD-10-CM | POA: Insufficient documentation

## 2015-11-07 DIAGNOSIS — M545 Low back pain, unspecified: Secondary | ICD-10-CM | POA: Insufficient documentation

## 2015-11-07 DIAGNOSIS — F17200 Nicotine dependence, unspecified, uncomplicated: Secondary | ICD-10-CM | POA: Insufficient documentation

## 2015-11-07 DIAGNOSIS — F32A Depression, unspecified: Secondary | ICD-10-CM | POA: Insufficient documentation

## 2015-11-07 DIAGNOSIS — G8929 Other chronic pain: Secondary | ICD-10-CM | POA: Insufficient documentation

## 2015-11-07 MED ORDER — LISINOPRIL-HYDROCHLOROTHIAZIDE 20-25 MG PO TABS: 1.0000 | ORAL_TABLET | Freq: Every day | ORAL | Status: DC

## 2015-11-07 MED ORDER — ZOLPIDEM TARTRATE 10 MG PO TABS: 10.0000 mg | ORAL_TABLET | Freq: Every evening | ORAL | Status: DC | PRN

## 2015-11-07 MED ORDER — POTASSIUM CHLORIDE CRYS ER 20 MEQ PO TBCR: EXTENDED_RELEASE_TABLET | ORAL | Status: DC

## 2015-11-07 NOTE — Assessment & Plan Note (Signed)
Stable on Prozac. Adding Wellbutrin for tobacco cessation today. Prozac refilled.

## 2015-11-07 NOTE — Assessment & Plan Note (Signed)
Followed by Pain management. On chronic narcotics. Will defer Rx's to Dr. Metta Clines.

## 2015-11-07 NOTE — Assessment & Plan Note (Signed)
Uncontrolled. ? Due to pain. BMP today. Refilling Lisinopril/HCTZ. Follow up BP check in 2 weeks.

## 2015-11-07 NOTE — Assessment & Plan Note (Signed)
Stable. Refilling Ambien.

## 2015-11-07 NOTE — Assessment & Plan Note (Signed)
Tobacco cessation counseling  Ask: Daily smoker (~1/2 ppd).  Advise: I advised her to quit today.   Assess: Patient open and willing to quit.  Assist: Discussed treatment options today - Nicotine replacement, Chantix, Wellbutrin. Patient would like to try wellbutrin. Rx sent today.  Arrange follow up: patient to follow up in 1-3 months.   Time spent: 5 mins

## 2015-11-09 ENCOUNTER — Other Ambulatory Visit (INDEPENDENT_AMBULATORY_CARE_PROVIDER_SITE_OTHER): Payer: Self-pay

## 2015-11-09 DIAGNOSIS — E876 Hypokalemia: Secondary | ICD-10-CM

## 2015-11-09 LAB — COMPREHENSIVE METABOLIC PANEL
ALK PHOS: 88 U/L (ref 39–117)
ALT: 12 U/L (ref 0–35)
AST: 16 U/L (ref 0–37)
Albumin: 4 g/dL (ref 3.5–5.2)
BUN: 11 mg/dL (ref 6–23)
CO2: 32 meq/L (ref 19–32)
Calcium: 9.4 mg/dL (ref 8.4–10.5)
Chloride: 102 mEq/L (ref 96–112)
Creatinine, Ser: 0.74 mg/dL (ref 0.40–1.20)
GFR: 87.58 mL/min (ref >60.00)
GLUCOSE: 117 mg/dL — AB (ref 70–99)
POTASSIUM: 3 meq/L — AB (ref 3.5–5.1)
SODIUM: 140 meq/L (ref 135–145)
TOTAL PROTEIN: 6.3 g/dL (ref 6.0–8.3)
Total Bilirubin: 0.5 mg/dL (ref 0.2–1.2)

## 2015-11-11 ENCOUNTER — Ambulatory Visit: Payer: Self-pay | Attending: Pain Medicine | Admitting: Pain Medicine

## 2015-11-11 ENCOUNTER — Encounter: Payer: Self-pay | Admitting: Pain Medicine

## 2015-11-11 VITALS — BP 171/70 | HR 71 | Temp 98.2°F | Resp 16 | Ht 65.0 in | Wt 144.0 lb

## 2015-11-11 DIAGNOSIS — F329 Major depressive disorder, single episode, unspecified: Secondary | ICD-10-CM | POA: Insufficient documentation

## 2015-11-11 DIAGNOSIS — M5481 Occipital neuralgia: Secondary | ICD-10-CM

## 2015-11-11 DIAGNOSIS — M51369 Other intervertebral disc degeneration, lumbar region without mention of lumbar back pain or lower extremity pain: Secondary | ICD-10-CM

## 2015-11-11 DIAGNOSIS — M5416 Radiculopathy, lumbar region: Secondary | ICD-10-CM

## 2015-11-11 DIAGNOSIS — M5116 Intervertebral disc disorders with radiculopathy, lumbar region: Secondary | ICD-10-CM | POA: Insufficient documentation

## 2015-11-11 DIAGNOSIS — M5136 Other intervertebral disc degeneration, lumbar region: Secondary | ICD-10-CM

## 2015-11-11 DIAGNOSIS — M5137 Other intervertebral disc degeneration, lumbosacral region: Secondary | ICD-10-CM

## 2015-11-11 DIAGNOSIS — M533 Sacrococcygeal disorders, not elsewhere classified: Secondary | ICD-10-CM

## 2015-11-11 DIAGNOSIS — M47816 Spondylosis without myelopathy or radiculopathy, lumbar region: Secondary | ICD-10-CM

## 2015-11-11 DIAGNOSIS — G588 Other specified mononeuropathies: Secondary | ICD-10-CM

## 2015-11-11 DIAGNOSIS — M51379 Other intervertebral disc degeneration, lumbosacral region without mention of lumbar back pain or lower extremity pain: Secondary | ICD-10-CM

## 2015-11-11 DIAGNOSIS — R45851 Suicidal ideations: Secondary | ICD-10-CM | POA: Insufficient documentation

## 2015-11-11 DIAGNOSIS — M5126 Other intervertebral disc displacement, lumbar region: Secondary | ICD-10-CM | POA: Insufficient documentation

## 2015-11-11 MED ORDER — MELOXICAM 7.5 MG PO TABS: ORAL_TABLET | ORAL | Status: DC

## 2015-11-11 MED ORDER — TIZANIDINE HCL 2 MG PO TABS: ORAL_TABLET | ORAL | Status: DC

## 2015-11-11 MED ORDER — TRAMADOL HCL 50 MG PO TABS: ORAL_TABLET | ORAL | Status: DC

## 2015-11-11 MED ORDER — ORPHENADRINE CITRATE ER 100 MG PO TB12: ORAL_TABLET | ORAL | Status: DC

## 2015-11-11 MED ORDER — HYDROCODONE-ACETAMINOPHEN 5-325 MG PO TABS: ORAL_TABLET | ORAL | Status: DC

## 2015-11-11 NOTE — Progress Notes (Signed)
Safety precautions to be maintained throughout the outpatient stay will include: orient to surroundings, keep bed in low position, maintain call bell within reach at all times, provide assistance with transfer out of bed and ambulation.  

## 2015-11-11 NOTE — Patient Instructions (Signed)
PLAN   Continue present medication Norflex Zanaflex Mobic hydrocodone and Ultram  F/U PCP for evaliation of  BP and general medical  condition  F/U surgical evaluation. May consider pending follow-up evaluations  F/U neurological evaluation. May consider pending follow-up evaluations  May consider radiofrequency rhizolysis or intraspinal procedures pending response to present treatment and F/U evaluation   Patient to call Pain Management Center should patient have concerns prior to scheduled return appointment. 

## 2015-11-11 NOTE — Progress Notes (Signed)
Subjective:    Patient ID: Crystal Roberson, female    DOB: Jul 31, 1966, 50 y.o.   MRN: [id number]  HPI  The patient is a 50 year old female who returns to pain management Center for further evaluation and treatment of pain involving the upper mid lower back and lower extremity regions. The patient also has a history of headaches. The patient states that she had some exacerbation of pain after cleaning her relative syndrome. The patient states that the pain occurs the mid back lower back region. The patient states that she had been doing very well following her previous procedure. We discussed patient's condition and patient will continue present medication and we will consider interventional treatment should patient symptoms persist despite noninterventional treatment measures. The patient stated the pain and spasm-type quality radiating from the lower back to the lower extremities on the left as well as on the right. The patient denied any significant headaches and denied increased weakness of the extremities. Patient also states that she had slight difficulty sleeping. We discuss medications and informed patient that Zanaflex may help in terms of sleep. We also caution patient regarding excessive drowsiness confusion and other undesirable side effects which can occur with Zanaflex. The patient in knowledge understanding and will make all attempts to avoid undesirable side effects. We will continue medications as prescribed. All agreed to suggested treatment plan. Patient will call prior to scheduled appointment should there be significant change in condition  Review of Systems     Objective:   Physical Exam   Palpation of the paraspinal muscular treat in the cervical region cervical facet region was attends to palpation of mild degree. There was mild tenderness of the splenius capitis and occipitalis musculature regions. Palpation over the region of the thoracic facet thoracic paraspinal musculature  region was with mild to moderate tends to palpation with evidence of muscle spasms occurring in the mid and lower thoracic region. No crepitus of the thoracic region was noted. Palpation of the acromioclavicular and glenohumeral joint regions reproduces minimal discomfort. Patient was able to perform drop test without significant difficulty. There was unremarkable Spurling's maneuver. Palpation over the lumbar paraspinal musculatures and lumbar facet region was with mild to moderate discomfort. Lateral bending rotation extension and palpation of the lumbar facets reproduce mild to moderate discomfort on the left as well as on the right. There was mild tenderness of the PSIS and PII S region as well as mild tenderness of the gluteal and piriformis muscle to regions. Palpation of the greater trochanteric region iliotibial band region reproduced moderate discomfort. Straight leg raise was tolerates approximately 30 without an increase of pain with dorsiflexion noted. DTRs appeared to be trace at the knees. EHL strength appeared to be slightly decreased. No definite sensory deficit or dermatomal dystrophy detected. There was negative clonus negative Homans. Abdomen was nontender with no costovertebral tenderness noted.     Assessment & Plan:      Degenerative disc disease lumbar spine Degenerative changes lumbar spine L3 nerve root deviation which may reflect compression of the nerve roots on the left side of the neural foramen and asymmetric bulging L5-S1 to the right of the midline, appears to occur below the level where the nerve seems to pass through the neural foramen and left paracentral disc bulge at L3 that appears to be significantly contributing to patient's symptomatology  Lumbar radiculopathy  Lumbar facet syndrome  Depression with suicidal ideations and with recent evaluation and treatment at  [facility]  Sacroiliac joint dysfunction  Bilateral greater occipital  neuralgia      PLAN   Continue present medication Norflex Zanaflex Mobic hydrocodone and Ultram  F/U PCP for evaliation of  BP and general medical  condition  F/U surgical evaluation. May consider pending follow-up evaluations  F/U neurological evaluation. May consider pending follow-up evaluations  May consider radiofrequency rhizolysis or intraspinal procedures pending response to present treatment and F/U evaluation   Patient to call Pain Management Center should patient have concerns prior to scheduled return appointment.

## 2015-11-24 ENCOUNTER — Other Ambulatory Visit: Payer: Self-pay | Admitting: Pain Medicine

## 2015-12-06 ENCOUNTER — Other Ambulatory Visit: Payer: Self-pay

## 2015-12-09 ENCOUNTER — Ambulatory Visit: Payer: Self-pay | Attending: Pain Medicine | Admitting: Pain Medicine

## 2015-12-09 ENCOUNTER — Encounter: Payer: Self-pay | Admitting: Pain Medicine

## 2015-12-09 VITALS — BP 162/84 | HR 72 | Temp 98.2°F | Resp 16 | Ht 65.0 in | Wt 141.0 lb

## 2015-12-09 DIAGNOSIS — F329 Major depressive disorder, single episode, unspecified: Secondary | ICD-10-CM | POA: Insufficient documentation

## 2015-12-09 DIAGNOSIS — M5137 Other intervertebral disc degeneration, lumbosacral region: Secondary | ICD-10-CM

## 2015-12-09 DIAGNOSIS — G588 Other specified mononeuropathies: Secondary | ICD-10-CM

## 2015-12-09 DIAGNOSIS — M47816 Spondylosis without myelopathy or radiculopathy, lumbar region: Secondary | ICD-10-CM

## 2015-12-09 DIAGNOSIS — M47896 Other spondylosis, lumbar region: Secondary | ICD-10-CM | POA: Insufficient documentation

## 2015-12-09 DIAGNOSIS — M5126 Other intervertebral disc displacement, lumbar region: Secondary | ICD-10-CM | POA: Insufficient documentation

## 2015-12-09 DIAGNOSIS — M5136 Other intervertebral disc degeneration, lumbar region: Secondary | ICD-10-CM

## 2015-12-09 DIAGNOSIS — M5416 Radiculopathy, lumbar region: Secondary | ICD-10-CM

## 2015-12-09 DIAGNOSIS — M5481 Occipital neuralgia: Secondary | ICD-10-CM | POA: Insufficient documentation

## 2015-12-09 DIAGNOSIS — M533 Sacrococcygeal disorders, not elsewhere classified: Secondary | ICD-10-CM | POA: Insufficient documentation

## 2015-12-09 DIAGNOSIS — R45851 Suicidal ideations: Secondary | ICD-10-CM | POA: Insufficient documentation

## 2015-12-09 MED ORDER — HYDROCODONE-ACETAMINOPHEN 5-325 MG PO TABS: ORAL_TABLET | ORAL | Status: DC

## 2015-12-09 MED ORDER — TIZANIDINE HCL 2 MG PO TABS: ORAL_TABLET | ORAL | Status: DC

## 2015-12-09 MED ORDER — MELOXICAM 7.5 MG PO TABS: ORAL_TABLET | ORAL | Status: DC

## 2015-12-09 MED ORDER — TRAMADOL HCL 50 MG PO TABS: ORAL_TABLET | ORAL | Status: DC

## 2015-12-09 NOTE — Progress Notes (Signed)
Safety precautions to be maintained throughout the outpatient stay will include: orient to surroundings, keep bed in low position, maintain call bell within reach at all times, provide assistance with transfer out of bed and ambulation.  

## 2015-12-09 NOTE — Progress Notes (Signed)
Subjective:    Patient ID: Crystal Roberson, female    DOB: 1966-04-23, 50 y.o.   MRN: [id number]  HPI   The patient is a 50 year old female who returns to pain management for further evaluation and treatment of pain involving the lower back and lower extremity region. The patient states that she had return of pain of the lower back radiating to the right lower extremity. The patient stated the pain has been present for 5 days and has continued to become more intense. The patient states that the pain radiation the lumbar region for the lower extremities continuing to the foot. The pain becomes more intense as patient spends more time on the feet. The patient states the pain is present and that she continues medications consisting of Norflex Zanaflex Mobic Ultram and hydrocodone acetaminophen. The patient states that she wishes to proceed with interventional treatment at time return appointment in attempt to decrease severity of the symptoms, minimize progression of symptoms, and avoid the need for more involved treatment. We will proceed with lumbosacral selective nerve root block at time return appointment in an attempt to decrease severity of patient's symptoms, minimize progression of symptoms, and avoid the need for more involved treatment.. All agreed to suggested treatment plan.    Review of Systems     Objective:   Physical Exam  There was tenderness to palpation of the paraspinal musculature in the cervical region cervical facet region palpation which reproduces mild discomfort. There was mild tenderness over the splenius capitis and occipitalis muscles regions. Palpation of the acromioclavicular and glenohumeral joint regions reproduce mild discomfort. The patient was with unremarkable Spurling's maneuver and appeared to be with bilaterally equal grip strength with Tinel and Phalen's maneuver reproducing minimal discomfort. There was tenderness over the thoracic facet thoracic paraspinal must  reason a moderate degree in the mid and lower thoracic region with evidence of muscle spasms of moderate degree involving the mid and lower thoracic paraspinal musculature regions. Palpation over the lumbar paraspinal must reason lumbar facet region was attends to palpation with moderate discomfort to moderately severe discomfort on the right compared to the left. There was tenderness over the PSIS and PII S region a moderate degree. Palpation of the gluteal and piriformis muscles regions reproduce moderate discomfort right greater than the left. Straight leg raising was tolerates approximately 20. No definite sensory deficit or dermatomal distribution detected. EHL strength appeared to be decreased. DTRs were trace at the knees. There was negative clonus negative Homans. EHL strength appeared to be decreased on the right compared to the left. 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 which may reflect compression of the nerve roots on the left side of the neural foramen and asymmetric bulging L5-S1 to the right of the midline, appears to occur below the level where the nerve seems to pass through the neural foramen and left paracentral disc bulge at L3 that appears to be significantly contributing to patient's symptomatology  Lumbar radiculopathy  Lumbar facet syndrome  Depression with suicidal ideations and with [facility]  Sacroiliac joint dysfunction  Bilateral greater occipital neuralgia      PLAN   Continue present medication Norflex Zanaflex Mobic hydrocodone and Ultram  Lumbosacral selective nerve root block to be performed at time of return appointment  F/U PCP for evaliation of  BP and  general medical  condition  F/U surgical evaluation. May consider pending follow-up evaluations  F/U neurological evaluation. May consider  PNCV/EMG studies and other studies pending follow-up evaluations  May consider radiofrequency rhizolysis or intraspinal procedures pending response to present treatment and F/U evaluation   Patient to call [facility] should patient have concerns prior to scheduled return appointment.

## 2015-12-09 NOTE — Patient Instructions (Addendum)
PLAN   Continue present medication Norflex Zanaflex Mobic hydrocodone and Ultram  Lumbosacral selective nerve root block to be performed at time of return appointment  F/U PCP for evaliation of  BP and general medical  condition  F/U surgical evaluation. May consider pending follow-up evaluations  F/U neurological evaluation. May consider PNCV/EMG studies and other studies pending follow-up evaluations  May consider radiofrequency rhizolysis or intraspinal procedures pending response to present treatment and F/U evaluation   Patient to call [facility] Management Center should patient have concerns prior to scheduled return appointment.GENERAL RISKS AND COMPLICATIONS  What are the risk, side effects and possible complications? Generally speaking, most procedures are safe.  However, with any procedure there are risks, side effects, and the possibility of complications.  The risks and complications are dependent upon the sites that are lesioned, or the type of nerve block to be performed.  The closer the procedure is to the spine, the more serious the risks are.  Great care is taken when placing the radio frequency needles, block needles or lesioning probes, but sometimes complications can occur. 1. Infection: Any time there is an injection through the skin, there is a risk of infection.  This is why sterile conditions are used for these blocks.  There are four possible types of infection. 1. Localized skin infection. 2. Central Nervous System Infection-This can be in the form of Meningitis, which can be deadly. 3. Epidural Infections-This can be in the form of an epidural abscess, which can cause pressure inside of the spine, causing compression of the spinal cord with subsequent paralysis. This would require an emergency surgery to decompress, and there are no guarantees that the patient would recover from the paralysis. 4. Discitis-This is an infection of the intervertebral discs.  It occurs in  about 1% of discography procedures.  It is difficult to treat and it may lead to surgery.        2. Pain: the needles have to go through skin and soft tissues, will cause soreness.       3. Damage to internal structures:  The nerves to be lesioned may be near blood vessels or    other nerves which can be potentially damaged.       4. Bleeding: Bleeding is more common if the patient is taking blood thinners such as  aspirin, Coumadin, Ticiid, Plavix, etc., or if he/she have some genetic predisposition  such as hemophilia. Bleeding into the spinal canal can cause compression of the spinal  cord with subsequent paralysis.  This would require an emergency surgery to  decompress and there are no guarantees that the patient would recover from the  paralysis.       5. Pneumothorax:  Puncturing of a lung is a possibility, every time a needle is introduced in  the area of the chest or upper back.  Pneumothorax refers to free air around the  collapsed lung(s), inside of the thoracic cavity (chest cavity).  Another two possible  complications related to a similar event would include: Hemothorax and Chylothorax.   These are variations of the Pneumothorax, where instead of air around the collapsed  lung(s), you may have blood or chyle, respectively.       6. Spinal headaches: They may occur with any procedures in the area of the spine.       7. Persistent CSF (Cerebro-Spinal Fluid) leakage: This is a rare problem, but may occur  with prolonged intrathecal or epidural catheters either due to the formation  of a fistulous  track or a dural tear.       8. Nerve damage: By working so close to the spinal cord, there is always a possibility of  nerve damage, which could be as serious as a permanent spinal cord injury with  paralysis.       9. Death:  Although rare, severe deadly allergic reactions known as "Anaphylactic  reaction" can occur to any of the medications used.      10. Worsening of the symptoms:  We can always  make thing worse.  What are the chances of something like this happening? Chances of any of this occuring are extremely low.  By statistics, you have more of a chance of getting killed in a motor vehicle accident: while driving to the hospital than any of the above occurring .  Nevertheless, you should be aware that they are possibilities.  In general, it is similar to taking a shower.  Everybody knows that you can slip, hit your head and get killed.  Does that mean that you should not shower again?  Nevertheless always keep in mind that statistics do not mean anything if you happen to be on the wrong side of them.  Even if a procedure has a 1 (one) in a 1,000,000 (million) chance of going wrong, it you happen to be that one..Also, keep in mind that by statistics, you have more of a chance of having something go wrong when taking medications.  Who should not have this procedure? If you are on a blood thinning medication (e.g. Coumadin, Plavix, see list of "Blood Thinners"), or if you have an active infection going on, you should not have the procedure.  If you are taking any blood thinners, please inform your physician.  How should I prepare for this procedure?  Do not eat or drink anything at least six hours prior to the procedure.  Bring a driver with you .  It cannot be a taxi.  Come accompanied by an adult that can drive you back, and that is strong enough to help you if your legs get weak or numb from the local anesthetic.  Take all of your medicines the morning of the procedure with just enough water to swallow them.  If you have diabetes, make sure that you are scheduled to have your procedure done first thing in the morning, whenever possible.  If you have diabetes, take only half of your insulin dose and notify our nurse that you have done so as soon as you arrive at the clinic.  If you are diabetic, but only take blood sugar pills (oral hypoglycemic), then do not take them on the  morning of your procedure.  You may take them after you have had the procedure.  Do not take aspirin or any aspirin-containing medications, at least eleven (11) days prior to the procedure.  They may prolong bleeding.  Wear loose fitting clothing that may be easy to take off and that you would not mind if it got stained with Betadine or blood.  Do not wear any jewelry or perfume  Remove any nail coloring.  It will interfere with some of our monitoring equipment.  NOTE: Remember that this is not meant to be interpreted as a complete list of all possible complications.  Unforeseen problems may occur.  BLOOD THINNERS The following drugs contain aspirin or other products, which can cause increased bleeding during surgery and should not be taken for 2 weeks prior to and  1 week after surgery.  If you should need take something for relief of minor pain, you may take acetaminophen which is found in Tylenol,m Datril, Anacin-3 and Panadol. It is not blood thinner. The products listed below are.  Do not take any of the products listed below in addition to any listed on your instruction sheet.  A.P.C or A.P.C with Codeine Codeine Phosphate Capsules #3 Ibuprofen Ridaura  ABC compound Congesprin Imuran rimadil  Advil Cope Indocin Robaxisal  Alka-Seltzer Effervescent Pain Reliever and Antacid Coricidin or Coricidin-D  Indomethacin Rufen  Alka-Seltzer plus Cold Medicine Cosprin Ketoprofen S-A-C Tablets  Anacin Analgesic Tablets or Capsules Coumadin Korlgesic Salflex  Anacin Extra Strength Analgesic tablets or capsules CP-2 Tablets Lanoril Salicylate  Anaprox Cuprimine Capsules Levenox Salocol  Anexsia-D Dalteparin Magan Salsalate  Anodynos Darvon compound Magnesium Salicylate Sine-off  Ansaid Dasin Capsules Magsal Sodium Salicylate  Anturane Depen Capsules Marnal Soma  APF Arthritis pain formula Dewitt's Pills Measurin Stanback  Argesic Dia-Gesic Meclofenamic Sulfinpyrazone  Arthritis Bayer Timed  Release Aspirin Diclofenac Meclomen Sulindac  Arthritis pain formula Anacin Dicumarol Medipren Supac  Analgesic (Safety coated) Arthralgen Diffunasal Mefanamic Suprofen  Arthritis Strength Bufferin Dihydrocodeine Mepro Compound Suprol  Arthropan liquid Dopirydamole Methcarbomol with Aspirin Synalgos  ASA tablets/Enseals Disalcid Micrainin Tagament  Ascriptin Doan's Midol Talwin  Ascriptin A/D Dolene Mobidin Tanderil  Ascriptin Extra Strength Dolobid Moblgesic Ticlid  Ascriptin with Codeine Doloprin or Doloprin with Codeine Momentum Tolectin  Asperbuf Duoprin Mono-gesic Trendar  Aspergum Duradyne Motrin or Motrin IB Triminicin  Aspirin plain, buffered or enteric coated Durasal Myochrisine Trigesic  Aspirin Suppositories Easprin Nalfon Trillsate  Aspirin with Codeine Ecotrin Regular or Extra Strength Naprosyn Uracel  Atromid-S Efficin Naproxen Ursinus  Auranofin Capsules Elmiron Neocylate Vanquish  Axotal Emagrin Norgesic Verin  Azathioprine Empirin or Empirin with Codeine Normiflo Vitamin E  Azolid Emprazil Nuprin Voltaren  Bayer Aspirin plain, buffered or children's or timed BC Tablets or powders Encaprin Orgaran Warfarin Sodium  Buff-a-Comp Enoxaparin Orudis Zorpin  Buff-a-Comp with Codeine Equegesic Os-Cal-Gesic   Buffaprin Excedrin plain, buffered or Extra Strength Oxalid   Bufferin Arthritis Strength Feldene Oxphenbutazone   Bufferin plain or Extra Strength Feldene Capsules Oxycodone with Aspirin   Bufferin with Codeine Fenoprofen Fenoprofen Pabalate or Pabalate-SF   Buffets II Flogesic Panagesic   Buffinol plain or Extra Strength Florinal or Florinal with Codeine Panwarfarin   Buf-Tabs Flurbiprofen Penicillamine   Butalbital Compound Four-way cold tablets Penicillin   Butazolidin Fragmin Pepto-Bismol   Carbenicillin Geminisyn Percodan   Carna Arthritis Reliever Geopen Persantine   Carprofen Gold's salt Persistin   Chloramphenicol Goody's Phenylbutazone   Chloromycetin  Haltrain Piroxlcam   Clmetidine heparin Plaquenil   Cllnoril Hyco-pap Ponstel   Clofibrate Hydroxy chloroquine Propoxyphen         Before stopping any of these medications, be sure to consult the physician who ordered them.  Some, such as Coumadin (Warfarin) are ordered to prevent or treat serious conditions such as "deep thrombosis", "pumonary embolisms", and other heart problems.  The amount of time that you may need off of the medication may also vary with the medication and the reason for which you were taking it.  If you are taking any of these medications, please make sure you notify your pain physician before you undergo any procedures.         Selective Nerve Root Block Patient Information  Description: Specific nerve roots exit the spinal canal and these nerves can be compressed and inflamed by a bulging disc and bone  spurs.  By injecting steroids on the nerve root, we can potentially decrease the inflammation surrounding these nerves, which often leads to decreased pain.  Also, by injecting local anesthesia on the nerve root, this can provide us helpful information to give to your referring doctor if it decreases your pain.  Selective nerve root blocks can be done along the spine from the neck to the low back depending on the location of your pain.   After numbing the skin with local anesthesia, a small needle is passed to the nerve root and the position of the needle is verified using x-ray pictures.  After the needle is in correct position, we then deposit the medication.  You may experience a pressure sensation while this is being done.  The entire block usually lasts less than 15 minutes.  Conditions that may be treated with selective nerve root blocks:  Low back and leg pain  Spinal stenosis  Diagnostic block prior to potential surgery  Neck and arm pain  Post laminectomy syndrome  Preparation for the injection:  1. Do not eat any solid food or dairy products within  8 hours of your appointment. 2. You may drink clear liquids up to 3 hours before an appointment.  Clear liquids include water, black coffee, juice or soda.  No milk or cream please. 3. You may take your regular medications, including pain medications, with a sip of water before your appointment.  Diabetics should hold regular insulin (if taken separately) and take 1/2 normal NPH dose the morning of the procedure.  Carry some sugar containing items with you to your appointment. 4. A driver must accompany you and be prepared to drive you home after your procedure. 5. Bring all your current medications with you. 6. An IV may be inserted and sedation may be given at the discretion of the physician. 7. A blood pressure cuff, EKG, and other monitors will often be applied during the procedure.  Some patients may need to have extra oxygen administered for a short period. 8. You will be asked to provide medical information, including allergies, prior to the procedure.  We must know immediately if you are taking blood  Thinners (like Coumadin) or if you are allergic to IV iodine contrast (dye).  Possible side-effects: All are usually temporary  Bleeding from needle site  Light headedness  Numbness and tingling  Decreased blood pressure  Weakness in arms/legs  Pressure sensation in back/neck  Pain at injection site (several days)  Possible complications: All are extremely rare  Infection  Nerve injury  Spinal headache (a headache wore with upright position)  Call if you experience:  Fever/chills associated with headache or increased back/neck pain  Headache worsened by an upright position  New onset weakness or numbness of an extremity below the injection site  Hives or difficulty breathing (go to the emergency room)  Inflammation or drainage at the injection site(s)  Severe back/neck pain greater than usual  New symptoms which are concerning to you  Please  note:  Although the local anesthetic injected can often make your back or neck feel good for several hours after the injection the pain will likely return.  It takes 3-5 days for steroids to work on the nerve root. You may not notice any pain relief for at least one week.  If effective, we will often do a series of 3 injections spaced 3-6 weeks apart to maximally decrease your pain.    If you have any questions, please call (  [phone] [facility]

## 2015-12-10 ENCOUNTER — Other Ambulatory Visit: Payer: Self-pay | Admitting: Pain Medicine

## 2015-12-13 ENCOUNTER — Other Ambulatory Visit: Payer: Self-pay

## 2015-12-13 ENCOUNTER — Ambulatory Visit: Payer: Self-pay | Attending: Pain Medicine | Admitting: Pain Medicine

## 2015-12-13 ENCOUNTER — Encounter: Payer: Self-pay | Admitting: Pain Medicine

## 2015-12-13 VITALS — BP 175/98 | HR 69 | Temp 97.5°F | Resp 16 | Ht 65.0 in | Wt 141.0 lb

## 2015-12-13 DIAGNOSIS — M545 Low back pain: Secondary | ICD-10-CM | POA: Insufficient documentation

## 2015-12-13 DIAGNOSIS — G588 Other specified mononeuropathies: Secondary | ICD-10-CM

## 2015-12-13 DIAGNOSIS — M533 Sacrococcygeal disorders, not elsewhere classified: Secondary | ICD-10-CM

## 2015-12-13 DIAGNOSIS — M5481 Occipital neuralgia: Secondary | ICD-10-CM

## 2015-12-13 DIAGNOSIS — M5126 Other intervertebral disc displacement, lumbar region: Secondary | ICD-10-CM | POA: Insufficient documentation

## 2015-12-13 DIAGNOSIS — M5137 Other intervertebral disc degeneration, lumbosacral region: Secondary | ICD-10-CM

## 2015-12-13 DIAGNOSIS — M5136 Other intervertebral disc degeneration, lumbar region: Secondary | ICD-10-CM | POA: Insufficient documentation

## 2015-12-13 DIAGNOSIS — M5416 Radiculopathy, lumbar region: Secondary | ICD-10-CM

## 2015-12-13 DIAGNOSIS — M47816 Spondylosis without myelopathy or radiculopathy, lumbar region: Secondary | ICD-10-CM

## 2015-12-13 MED ORDER — FENTANYL CITRATE (PF) 100 MCG/2ML IJ SOLN: 100.0000 ug | Freq: Once | INTRAMUSCULAR | Status: DC

## 2015-12-13 MED ORDER — MIDAZOLAM HCL 5 MG/5ML IJ SOLN: 5.0000 mg | Freq: Once | INTRAMUSCULAR | Status: DC

## 2015-12-13 MED ORDER — CEFAZOLIN SODIUM 1 G IJ SOLR
INTRAMUSCULAR | Status: AC
  Administered 2015-12-13: 09:00:00 via INTRAVENOUS
  Filled 2015-12-13: qty 10

## 2015-12-13 MED ORDER — TRIAMCINOLONE ACETONIDE 40 MG/ML IJ SUSP
INTRAMUSCULAR | Status: AC
  Administered 2015-12-13: 09:00:00
  Filled 2015-12-13: qty 1

## 2015-12-13 MED ORDER — CEFUROXIME AXETIL 250 MG PO TABS: 250.0000 mg | ORAL_TABLET | Freq: Two times a day (BID) | ORAL | Status: DC

## 2015-12-13 MED ORDER — FENTANYL CITRATE (PF) 100 MCG/2ML IJ SOLN
INTRAMUSCULAR | Status: AC
  Administered 2015-12-13: 100 ug via INTRAVENOUS
  Filled 2015-12-13: qty 2

## 2015-12-13 MED ORDER — ORPHENADRINE CITRATE 30 MG/ML IJ SOLN: 60.0000 mg | Freq: Once | INTRAMUSCULAR | Status: DC

## 2015-12-13 MED ORDER — CEFUROXIME AXETIL 250 MG PO TABS
250.0000 mg | ORAL_TABLET | Freq: Two times a day (BID) | ORAL | Status: DC
  Filled 2015-12-13 (×3): qty 1

## 2015-12-13 MED ORDER — ORPHENADRINE CITRATE 30 MG/ML IJ SOLN
INTRAMUSCULAR | Status: AC
  Administered 2015-12-13: 09:00:00
  Filled 2015-12-13: qty 2

## 2015-12-13 MED ORDER — MIDAZOLAM HCL 5 MG/5ML IJ SOLN
INTRAMUSCULAR | Status: AC
  Administered 2015-12-13: 5 mg via INTRAVENOUS
  Filled 2015-12-13: qty 5

## 2015-12-13 MED ORDER — LACTATED RINGERS IV SOLN: 1000.0000 mL | INTRAVENOUS | Status: DC

## 2015-12-13 MED ORDER — TRIAMCINOLONE ACETONIDE 40 MG/ML IJ SUSP: 40.0000 mg | Freq: Once | INTRAMUSCULAR | Status: DC

## 2015-12-13 MED ORDER — LIDOCAINE HCL (PF) 1 % IJ SOLN: 10.0000 mL | Freq: Once | INTRAMUSCULAR | Status: DC

## 2015-12-13 MED ORDER — BUPIVACAINE HCL (PF) 0.25 % IJ SOLN: 30.0000 mL | Freq: Once | INTRAMUSCULAR | Status: DC

## 2015-12-13 MED ORDER — BUPIVACAINE HCL (PF) 0.25 % IJ SOLN
INTRAMUSCULAR | Status: AC
  Administered 2015-12-13: 09:00:00
  Filled 2015-12-13: qty 30

## 2015-12-13 MED ORDER — LIDOCAINE HCL (PF) 1 % IJ SOLN
INTRAMUSCULAR | Status: AC
  Administered 2015-12-13: 09:00:00
  Filled 2015-12-13: qty 5

## 2015-12-13 MED ORDER — CEFAZOLIN SODIUM 1-5 GM-% IV SOLN: 1.0000 g | Freq: Once | INTRAVENOUS | Status: DC

## 2015-12-13 NOTE — Progress Notes (Signed)
Patient here for procedure for lower back pain. Safety precautions to be maintained throughout the outpatient stay will include: orient to surroundings, keep bed in low position, maintain call bell within reach at all times, provide assistance with transfer out of bed and ambulation.  

## 2015-12-13 NOTE — Patient Instructions (Addendum)
PLAN   Continue present medication Norflex Zanaflex Mobic hydrocodone and Ultram Please get Ceftin antibiotic today and begin taking Ceftin antibiotic today as prescribe  F/U PCP for evaliation of  BP and general medical  condition  F/U surgical evaluation. May consider pending follow-up evaluations  F/U neurological evaluation. May consider PNCV/EMG studies and other studies pending follow-up evaluations  May consider radiofrequency rhizolysis or intraspinal procedures pending response to present treatment and F/U evaluation   Patient to call [facility] should patient have concerns prior to scheduled return appointment. GENERAL RISKS AND COMPLICATIONS  What are the risk, side effects and possible complications? Generally speaking, most procedures are safe.  However, with any procedure there are risks, side effects, and the possibility of complications.  The risks and complications are dependent upon the sites that are lesioned, or the type of nerve block to be performed.  The closer the procedure is to the spine, the more serious the risks are.  Great care is taken when placing the radio frequency needles, block needles or lesioning probes, but sometimes complications can occur. 1. Infection: Any time there is an injection through the skin, there is a risk of infection.  This is why sterile conditions are used for these blocks.  There are four possible types of infection. 1. Localized skin infection. 2. Central Nervous System Infection-This can be in the form of Meningitis, which can be deadly. 3. Epidural Infections-This can be in the form of an epidural abscess, which can cause pressure inside of the spine, causing compression of the spinal cord with subsequent paralysis. This would require an emergency surgery to decompress, and there are no guarantees that the patient would recover from the paralysis. 4. Discitis-This is an infection of the intervertebral discs.  It occurs in  about 1% of discography procedures.  It is difficult to treat and it may lead to surgery.        2. Pain: the needles have to go through skin and soft tissues, will cause soreness.       3. Damage to internal structures:  The nerves to be lesioned may be near blood vessels or    other nerves which can be potentially damaged.       4. Bleeding: Bleeding is more common if the patient is taking blood thinners such as  aspirin, Coumadin, Ticiid, Plavix, etc., or if he/she have some genetic predisposition  such as hemophilia. Bleeding into the spinal canal can cause compression of the spinal  cord with subsequent paralysis.  This would require an emergency surgery to  decompress and there are no guarantees that the patient would recover from the  paralysis.       5. Pneumothorax:  Puncturing of a lung is a possibility, every time a needle is introduced in  the area of the chest or upper back.  Pneumothorax refers to free air around the  collapsed lung(s), inside of the thoracic cavity (chest cavity).  Another two possible  complications related to a similar event would include: Hemothorax and Chylothorax.   These are variations of the Pneumothorax, where instead of air around the collapsed  lung(s), you may have blood or chyle, respectively.       6. Spinal headaches: They may occur with any procedures in the area of the spine.       7. Persistent CSF (Cerebro-Spinal Fluid) leakage: This is a rare problem, but may occur  with prolonged intrathecal or epidural catheters either due to the formation  of a fistulous  track or a dural tear.       8. Nerve damage: By working so close to the spinal cord, there is always a possibility of  nerve damage, which could be as serious as a permanent spinal cord injury with  paralysis.       9. Death:  Although rare, severe deadly allergic reactions known as "Anaphylactic  reaction" can occur to any of the medications used.      10. Worsening of the symptoms:  We can always  make thing worse.  What are the chances of something like this happening? Chances of any of this occuring are extremely low.  By statistics, you have more of a chance of getting killed in a motor vehicle accident: while driving to the hospital than any of the above occurring .  Nevertheless, you should be aware that they are possibilities.  In general, it is similar to taking a shower.  Everybody knows that you can slip, hit your head and get killed.  Does that mean that you should not shower again?  Nevertheless always keep in mind that statistics do not mean anything if you happen to be on the wrong side of them.  Even if a procedure has a 1 (one) in a 1,000,000 (million) chance of going wrong, it you happen to be that one..Also, keep in mind that by statistics, you have more of a chance of having something go wrong when taking medications.  Who should not have this procedure? If you are on a blood thinning medication (e.g. Coumadin, Plavix, see list of "Blood Thinners"), or if you have an active infection going on, you should not have the procedure.  If you are taking any blood thinners, please inform your physician.  How should I prepare for this procedure?  Do not eat or drink anything at least six hours prior to the procedure.  Bring a driver with you .  It cannot be a taxi.  Come accompanied by an adult that can drive you back, and that is strong enough to help you if your legs get weak or numb from the local anesthetic.  Take all of your medicines the morning of the procedure with just enough water to swallow them.  If you have diabetes, make sure that you are scheduled to have your procedure done first thing in the morning, whenever possible.  If you have diabetes, take only half of your insulin dose and notify our nurse that you have done so as soon as you arrive at the clinic.  If you are diabetic, but only take blood sugar pills (oral hypoglycemic), then do not take them on the  morning of your procedure.  You may take them after you have had the procedure.  Do not take aspirin or any aspirin-containing medications, at least eleven (11) days prior to the procedure.  They may prolong bleeding.  Wear loose fitting clothing that may be easy to take off and that you would not mind if it got stained with Betadine or blood.  Do not wear any jewelry or perfume  Remove any nail coloring.  It will interfere with some of our monitoring equipment.  NOTE: Remember that this is not meant to be interpreted as a complete list of all possible complications.  Unforeseen problems may occur.  BLOOD THINNERS The following drugs contain aspirin or other products, which can cause increased bleeding during surgery and should not be taken for 2 weeks prior to and  1 week after surgery.  If you should need take something for relief of minor pain, you may take acetaminophen which is found in Tylenol,m Datril, Anacin-3 and Panadol. It is not blood thinner. The products listed below are.  Do not take any of the products listed below in addition to any listed on your instruction sheet.  A.P.C or A.P.C with Codeine Codeine Phosphate Capsules #3 Ibuprofen Ridaura  ABC compound Congesprin Imuran rimadil  Advil Cope Indocin Robaxisal  Alka-Seltzer Effervescent Pain Reliever and Antacid Coricidin or Coricidin-D  Indomethacin Rufen  Alka-Seltzer plus Cold Medicine Cosprin Ketoprofen S-A-C Tablets  Anacin Analgesic Tablets or Capsules Coumadin Korlgesic Salflex  Anacin Extra Strength Analgesic tablets or capsules CP-2 Tablets Lanoril Salicylate  Anaprox Cuprimine Capsules Levenox Salocol  Anexsia-D Dalteparin Magan Salsalate  Anodynos Darvon compound Magnesium Salicylate Sine-off  Ansaid Dasin Capsules Magsal Sodium Salicylate  Anturane Depen Capsules Marnal Soma  APF Arthritis pain formula Dewitt's Pills Measurin Stanback  Argesic Dia-Gesic Meclofenamic Sulfinpyrazone  Arthritis Bayer Timed  Release Aspirin Diclofenac Meclomen Sulindac  Arthritis pain formula Anacin Dicumarol Medipren Supac  Analgesic (Safety coated) Arthralgen Diffunasal Mefanamic Suprofen  Arthritis Strength Bufferin Dihydrocodeine Mepro Compound Suprol  Arthropan liquid Dopirydamole Methcarbomol with Aspirin Synalgos  ASA tablets/Enseals Disalcid Micrainin Tagament  Ascriptin Doan's Midol Talwin  Ascriptin A/D Dolene Mobidin Tanderil  Ascriptin Extra Strength Dolobid Moblgesic Ticlid  Ascriptin with Codeine Doloprin or Doloprin with Codeine Momentum Tolectin  Asperbuf Duoprin Mono-gesic Trendar  Aspergum Duradyne Motrin or Motrin IB Triminicin  Aspirin plain, buffered or enteric coated Durasal Myochrisine Trigesic  Aspirin Suppositories Easprin Nalfon Trillsate  Aspirin with Codeine Ecotrin Regular or Extra Strength Naprosyn Uracel  Atromid-S Efficin Naproxen Ursinus  Auranofin Capsules Elmiron Neocylate Vanquish  Axotal Emagrin Norgesic Verin  Azathioprine Empirin or Empirin with Codeine Normiflo Vitamin E  Azolid Emprazil Nuprin Voltaren  Bayer Aspirin plain, buffered or children's or timed BC Tablets or powders Encaprin Orgaran Warfarin Sodium  Buff-a-Comp Enoxaparin Orudis Zorpin  Buff-a-Comp with Codeine Equegesic Os-Cal-Gesic   Buffaprin Excedrin plain, buffered or Extra Strength Oxalid   Bufferin Arthritis Strength Feldene Oxphenbutazone   Bufferin plain or Extra Strength Feldene Capsules Oxycodone with Aspirin   Bufferin with Codeine Fenoprofen Fenoprofen Pabalate or Pabalate-SF   Buffets II Flogesic Panagesic   Buffinol plain or Extra Strength Florinal or Florinal with Codeine Panwarfarin   Buf-Tabs Flurbiprofen Penicillamine   Butalbital Compound Four-way cold tablets Penicillin   Butazolidin Fragmin Pepto-Bismol   Carbenicillin Geminisyn Percodan   Carna Arthritis Reliever Geopen Persantine   Carprofen Gold's salt Persistin   Chloramphenicol Goody's Phenylbutazone   Chloromycetin  Haltrain Piroxlcam   Clmetidine heparin Plaquenil   Cllnoril Hyco-pap Ponstel   Clofibrate Hydroxy chloroquine Propoxyphen         Before stopping any of these medications, be sure to consult the physician who ordered them.  Some, such as Coumadin (Warfarin) are ordered to prevent or treat serious conditions such as "deep thrombosis", "pumonary embolisms", and other heart problems.  The amount of time that you may need off of the medication may also vary with the medication and the reason for which you were taking it.  If you are taking any of these medications, please make sure you notify your pain physician before you undergo any procedures.         GENERAL RISKS AND COMPLICATIONS  What are the risk, side effects and possible complications? Generally speaking, most procedures are safe.  However, with any procedure there are risks, side  effects, and the possibility of complications.  The risks and complications are dependent upon the sites that are lesioned, or the type of nerve block to be performed.  The closer the procedure is to the spine, the more serious the risks are.  Great care is taken when placing the radio frequency needles, block needles or lesioning probes, but sometimes complications can occur. 2. Infection: Any time there is an injection through the skin, there is a risk of infection.  This is why sterile conditions are used for these blocks.  There are four possible types of infection. 1. Localized skin infection. 2. Central Nervous System Infection-This can be in the form of Meningitis, which can be deadly. 3. Epidural Infections-This can be in the form of an epidural abscess, which can cause pressure inside of the spine, causing compression of the spinal cord with subsequent paralysis. This would require an emergency surgery to decompress, and there are no guarantees that the patient would recover from the paralysis. 4. Discitis-This is an infection of the intervertebral  discs.  It occurs in about 1% of discography procedures.  It is difficult to treat and it may lead to surgery.        2. Pain: the needles have to go through skin and soft tissues, will cause soreness.       3. Damage to internal structures:  The nerves to be lesioned may be near blood vessels or    other nerves which can be potentially damaged.       4. Bleeding: Bleeding is more common if the patient is taking blood thinners such as  aspirin, Coumadin, Ticiid, Plavix, etc., or if he/she have some genetic predisposition  such as hemophilia. Bleeding into the spinal canal can cause compression of the spinal  cord with subsequent paralysis.  This would require an emergency surgery to  decompress and there are no guarantees that the patient would recover from the  paralysis.       5. Pneumothorax:  Puncturing of a lung is a possibility, every time a needle is introduced in  the area of the chest or upper back.  Pneumothorax refers to free air around the  collapsed lung(s), inside of the thoracic cavity (chest cavity).  Another two possible  complications related to a similar event would include: Hemothorax and Chylothorax.   These are variations of the Pneumothorax, where instead of air around the collapsed  lung(s), you may have blood or chyle, respectively.       6. Spinal headaches: They may occur with any procedures in the area of the spine.       7. Persistent CSF (Cerebro-Spinal Fluid) leakage: This is a rare problem, but may occur  with prolonged intrathecal or epidural catheters either due to the formation of a fistulous  track or a dural tear.       8. Nerve damage: By working so close to the spinal cord, there is always a possibility of  nerve damage, which could be as serious as a permanent spinal cord injury with  paralysis.       9. Death:  Although rare, severe deadly allergic reactions known as "Anaphylactic  reaction" can occur to any of the medications used.      10. Worsening of the  symptoms:  We can always make thing worse.  What are the chances of something like this happening? Chances of any of this occuring are extremely low.  By statistics, you have more of a chance  of getting killed in a motor vehicle accident: while driving to the hospital than any of the above occurring .  Nevertheless, you should be aware that they are possibilities.  In general, it is similar to taking a shower.  Everybody knows that you can slip, hit your head and get killed.  Does that mean that you should not shower again?  Nevertheless always keep in mind that statistics do not mean anything if you happen to be on the wrong side of them.  Even if a procedure has a 1 (one) in a 1,000,000 (million) chance of going wrong, it you happen to be that one..Also, keep in mind that by statistics, you have more of a chance of having something go wrong when taking medications.  Who should not have this procedure? If you are on a blood thinning medication (e.g. Coumadin, Plavix, see list of "Blood Thinners"), or if you have an active infection going on, you should not have the procedure.  If you are taking any blood thinners, please inform your physician.  How should I prepare for this procedure?  Do not eat or drink anything at least six hours prior to the procedure.  Bring a driver with you .  It cannot be a taxi.  Come accompanied by an adult that can drive you back, and that is strong enough to help you if your legs get weak or numb from the local anesthetic.  Take all of your medicines the morning of the procedure with just enough water to swallow them.  If you have diabetes, make sure that you are scheduled to have your procedure done first thing in the morning, whenever possible.  If you have diabetes, take only half of your insulin dose and notify our nurse that you have done so as soon as you arrive at the clinic.  If you are diabetic, but only take blood sugar pills (oral hypoglycemic), then do  not take them on the morning of your procedure.  You may take them after you have had the procedure.  Do not take aspirin or any aspirin-containing medications, at least eleven (11) days prior to the procedure.  They may prolong bleeding.  Wear loose fitting clothing that may be easy to take off and that you would not mind if it got stained with Betadine or blood.  Do not wear any jewelry or perfume  Remove any nail coloring.  It will interfere with some of our monitoring equipment.  NOTE: Remember that this is not meant to be interpreted as a complete list of all possible complications.  Unforeseen problems may occur.  BLOOD THINNERS The following drugs contain aspirin or other products, which can cause increased bleeding during surgery and should not be taken for 2 weeks prior to and 1 week after surgery.  If you should need take something for relief of minor pain, you may take acetaminophen which is found in Tylenol,m Datril, Anacin-3 and Panadol. It is not blood thinner. The products listed below are.  Do not take any of the products listed below in addition to any listed on your instruction sheet.  A.P.C or A.P.C with Codeine Codeine Phosphate Capsules #3 Ibuprofen Ridaura  ABC compound Congesprin Imuran rimadil  Advil Cope Indocin Robaxisal  Alka-Seltzer Effervescent Pain Reliever and Antacid Coricidin or Coricidin-D  Indomethacin Rufen  Alka-Seltzer plus Cold Medicine Cosprin Ketoprofen S-A-C Tablets  Anacin Analgesic Tablets or Capsules Coumadin Korlgesic Salflex  Anacin Extra Strength Analgesic tablets or capsules CP-2 Tablets Lanoril  Salicylate  Anaprox Cuprimine Capsules Levenox Salocol  Anexsia-D Dalteparin Magan Salsalate  Anodynos Darvon compound Magnesium Salicylate Sine-off  Ansaid Dasin Capsules Magsal Sodium Salicylate  Anturane Depen Capsules Marnal Soma  APF Arthritis pain formula Dewitt's Pills Measurin Stanback  Argesic Dia-Gesic Meclofenamic Sulfinpyrazone   Arthritis Bayer Timed Release Aspirin Diclofenac Meclomen Sulindac  Arthritis pain formula Anacin Dicumarol Medipren Supac  Analgesic (Safety coated) Arthralgen Diffunasal Mefanamic Suprofen  Arthritis Strength Bufferin Dihydrocodeine Mepro Compound Suprol  Arthropan liquid Dopirydamole Methcarbomol with Aspirin Synalgos  ASA tablets/Enseals Disalcid Micrainin Tagament  Ascriptin Doan's Midol Talwin  Ascriptin A/D Dolene Mobidin Tanderil  Ascriptin Extra Strength Dolobid Moblgesic Ticlid  Ascriptin with Codeine Doloprin or Doloprin with Codeine Momentum Tolectin  Asperbuf Duoprin Mono-gesic Trendar  Aspergum Duradyne Motrin or Motrin IB Triminicin  Aspirin plain, buffered or enteric coated Durasal Myochrisine Trigesic  Aspirin Suppositories Easprin Nalfon Trillsate  Aspirin with Codeine Ecotrin Regular or Extra Strength Naprosyn Uracel  Atromid-S Efficin Naproxen Ursinus  Auranofin Capsules Elmiron Neocylate Vanquish  Axotal Emagrin Norgesic Verin  Azathioprine Empirin or Empirin with Codeine Normiflo Vitamin E  Azolid Emprazil Nuprin Voltaren  Bayer Aspirin plain, buffered or children's or timed BC Tablets or powders Encaprin Orgaran Warfarin Sodium  Buff-a-Comp Enoxaparin Orudis Zorpin  Buff-a-Comp with Codeine Equegesic Os-Cal-Gesic   Buffaprin Excedrin plain, buffered or Extra Strength Oxalid   Bufferin Arthritis Strength Feldene Oxphenbutazone   Bufferin plain or Extra Strength Feldene Capsules Oxycodone with Aspirin   Bufferin with Codeine Fenoprofen Fenoprofen Pabalate or Pabalate-SF   Buffets II Flogesic Panagesic   Buffinol plain or Extra Strength Florinal or Florinal with Codeine Panwarfarin   Buf-Tabs Flurbiprofen Penicillamine   Butalbital Compound Four-way cold tablets Penicillin   Butazolidin Fragmin Pepto-Bismol   Carbenicillin Geminisyn Percodan   Carna Arthritis Reliever Geopen Persantine   Carprofen Gold's salt Persistin   Chloramphenicol Goody's Phenylbutazone    Chloromycetin Haltrain Piroxlcam   Clmetidine heparin Plaquenil   Cllnoril Hyco-pap Ponstel   Clofibrate Hydroxy chloroquine Propoxyphen         Before stopping any of these medications, be sure to consult the physician who ordered them.  Some, such as Coumadin (Warfarin) are ordered to prevent or treat serious conditions such as "deep thrombosis", "pumonary embolisms", and other heart problems.  The amount of time that you may need off of the medication may also vary with the medication and the reason for which you were taking it.  If you are taking any of these medications, please make sure you notify your pain physician before you undergo any procedures.

## 2015-12-13 NOTE — Telephone Encounter (Signed)
Nurses I need prescribed Ceftin to pharmacy again Ceftin 250 mg by mouth twice a day quantity 14 Please call pharmacy and if they did not receive E prescription please call in Ceftin as I just stated

## 2015-12-13 NOTE — Progress Notes (Signed)
Subjective:    Patient ID: Crystal Roberson, female    DOB: [birth year], 50 y.o.   MRN: [id number]  HPI  PROCEDURE PERFORMED: Lumbosacral selective nerve root block   NOTE: The patient is a 50 y.o. female who returns to [facility] for further evaluation and treatment of pain involving the lumbar and lower extremity region. Studies consisting of MRI has revealed the patient to be with evidence of degenerative changes lumbar spine L3 nerve root deviation which may reflect compression of the nerve roots on the left side of the neural foramen and asymmetric bulging L5-S1 to the right of the midline, appears to occur below the level where the nerve seems to pass through the neural foramen and left paracentral disc bulge at L3 that appears to be significantly contributing to patient's symptomatology.. There is concern regarding component of patient's pain being due to lumbar radiculopathy The risks, benefits, and expectations of the procedure have been explained to the patient who was understanding and in agreement with suggested treatment plan. We will proceed with interventional treatment as discussed and as explained to the patient. The patient is understanding and in agreement with suggested treatment plan.   DESCRIPTION OF PROCEDURE: Lumbosacral selective nerve root block with IV Versed, IV fentanyl conscious sedation, EKG, blood pressure, pulse, and pulse oximetry monitoring. The procedure was performed with the patient in the prone position under fluoroscopic guidance. With the patient in the prone position, Betadine prep of proposed entry site was performed. Local anesthetic skin wheal of proposed needle entry site was prepared with 1.5% plain lidocaine with AP view of the lumbosacral spine.   PROCEDURE #1: Needle placement at the left L 2 vertebral body: A 22 -gauge needle was inserted at the inferior border of the transverse process of the vertebral body with needle placed medial to the  midline of the transverse process on AP view of the lumbosacral spine.   NEEDLE PLACEMENT AT  L3, L4, and L5  VERTEBRAL BODY LEVELS  Needle  placement was accomplished at L3, L4, and L5  vertebral body levels on the left side exactly as was accomplished at the L2  vertebral body level  and utilizing the same technique and under fluoroscopic guidance.   Needle placement was then verified on lateral view at all levels with needle tip documented to be in the posterior superior quadrant of the intervertebral foramen of  L 2, L3, L4, and L5. Following negative aspiration for heme and CSF at each level, each level was injected with 3 mL of 0.25% bupivacaine with Kenalog.   LUMBOSACRAL SELECTIVE NERVE ROOT BLOCKS THE THE  RIGHT SIDE  The procedure was performed on the right side exactly as was performed on the left side and at the same levels  Under fluoroscopic guidance and utilizing the same technique.  Myoneural block injections of the thoracic region Following Betadine prep of proposed entry site a 22-gauge needle was inserted in the thoracic musculature region and following negative aspiration 2 cc of 0.25% bupivacaine with Norflex was injected for myoneural block injection of the thoracic region times two     The patient tolerated the procedure well.   A total of 10 mg of Kenalog was utilized for the procedure.   PLAN:  1. Medications: Will continue presently prescribed medications.Norflex Mobic Zanaflex Ultram and hydrocodone acetaminophen   2. The patient is to undergo follow-up evaluation with PCP Dr. Adriana Simasook  for evaluation of blood pressure and general medical condition status post procedure  performed on today's visit. 3. Surgical follow-up evaluation Has been addressedal evaluation. 4. Neurological evaluation. May consider PNCV EMG studies  5. May consider radiofrequency procedures, implantation type procedures and other treatment pending response to treatment and follow-up  evaluation. 6. The patient has been advise do adhere to proper body mechanics and avoid activities which may aggravate condition. 7. The patient has been advised to call the Pain Management Center prior to scheduled return appointment should there be significant change in the patient's condition or should the patient have other concerns regarding condition prior to scheduled return appointment.   Review of Systems     Objective:   Physical Exam        Assessment & Plan:

## 2015-12-13 NOTE — Telephone Encounter (Signed)
Dr. Metta Clinesrisp, the Ceftin did not go through to the pharmacy. Will you please e-scribe it again?

## 2015-12-13 NOTE — Telephone Encounter (Signed)
Pts Pharmacy said pt came to get her antibiotic and they never received anything to go ahead and fill it. [facility]

## 2015-12-14 ENCOUNTER — Telehealth: Payer: Self-pay | Admitting: *Deleted

## 2015-12-14 NOTE — Telephone Encounter (Signed)
Tried to reach patient with no answer and 2nd line has been disconnected.

## 2015-12-14 NOTE — Telephone Encounter (Signed)
Patient notified per voicemail that Ceftin has now been sent to the pharmacy.

## 2016-01-06 ENCOUNTER — Ambulatory Visit: Payer: Self-pay | Attending: Pain Medicine | Admitting: Pain Medicine

## 2016-01-06 ENCOUNTER — Encounter: Payer: Self-pay | Admitting: Pain Medicine

## 2016-01-06 VITALS — BP 160/87 | HR 74 | Temp 98.1°F | Resp 18 | Ht 65.0 in | Wt 139.0 lb

## 2016-01-06 DIAGNOSIS — M533 Sacrococcygeal disorders, not elsewhere classified: Secondary | ICD-10-CM | POA: Insufficient documentation

## 2016-01-06 DIAGNOSIS — R45851 Suicidal ideations: Secondary | ICD-10-CM | POA: Insufficient documentation

## 2016-01-06 DIAGNOSIS — M5136 Other intervertebral disc degeneration, lumbar region: Secondary | ICD-10-CM

## 2016-01-06 DIAGNOSIS — R51 Headache: Secondary | ICD-10-CM | POA: Insufficient documentation

## 2016-01-06 DIAGNOSIS — M47896 Other spondylosis, lumbar region: Secondary | ICD-10-CM | POA: Insufficient documentation

## 2016-01-06 DIAGNOSIS — M546 Pain in thoracic spine: Secondary | ICD-10-CM | POA: Insufficient documentation

## 2016-01-06 DIAGNOSIS — M5137 Other intervertebral disc degeneration, lumbosacral region: Secondary | ICD-10-CM

## 2016-01-06 DIAGNOSIS — M47816 Spondylosis without myelopathy or radiculopathy, lumbar region: Secondary | ICD-10-CM

## 2016-01-06 DIAGNOSIS — F329 Major depressive disorder, single episode, unspecified: Secondary | ICD-10-CM | POA: Insufficient documentation

## 2016-01-06 DIAGNOSIS — M5481 Occipital neuralgia: Secondary | ICD-10-CM

## 2016-01-06 DIAGNOSIS — M5126 Other intervertebral disc displacement, lumbar region: Secondary | ICD-10-CM | POA: Insufficient documentation

## 2016-01-06 DIAGNOSIS — M5416 Radiculopathy, lumbar region: Secondary | ICD-10-CM

## 2016-01-06 DIAGNOSIS — G588 Other specified mononeuropathies: Secondary | ICD-10-CM

## 2016-01-06 MED ORDER — MELOXICAM 7.5 MG PO TABS: ORAL_TABLET | ORAL | Status: DC

## 2016-01-06 MED ORDER — TIZANIDINE HCL 2 MG PO TABS: ORAL_TABLET | ORAL | Status: DC

## 2016-01-06 MED ORDER — HYDROCODONE-ACETAMINOPHEN 5-325 MG PO TABS: ORAL_TABLET | ORAL | Status: DC

## 2016-01-06 MED ORDER — TRAMADOL HCL 50 MG PO TABS: ORAL_TABLET | ORAL | Status: DC

## 2016-01-06 NOTE — Progress Notes (Signed)
Subjective:    Patient ID: Crystal Roberson, female    DOB: [birth year], 50 y.o.   MRN: [id number]  HPI  The patient is a 50 year old female who returns to pain management for further evaluation and treatment of pain involving the mid lower back and lower extremity region. The patient also has a history of pain involving the upper back and headaches. Patient states she is doing remarkably well at this time status post prior treatment performed in pain management Center. The patient denies any trauma change in events of daily living the call significant change in symptomatology. The patient continues her medications consisting of Zanaflex Mobic Norflex hydrocodone acetaminophen and Ultram. The patient has been able to perform activities of daily living and has been able to perform increased chores around the house following prior procedures performed in pain management Center. We will remain available to consider patient for treatment including interventional treatment pending follow-up evaluations. At present time patient continues to do well following previous interventional treatment performed in pain management Center. The patient to call pain management for any change in condition prior to scheduled return appointment. All agreed to suggested treatment plan    Review of Systems     Objective:   Physical Exam   Ere was tenderness to palpation of the splenius capitis and occipitalis muscles regions palpation which be produced pain of mild degree with mild tenderness over the cervical facet cervical paraspinal muscular region.Palpation over the region of the acromial clavicular and glenohumeral joint regions were with mild tenderness to palpation and patient was unremarkable Spurling's maneuver. The patient was with bilaterally equal grip strength with Tinel and Phalen's maneuver reproducing minimal discomfort. Palpation over the thoracic facet thoracic paraspinal musculature region with evidence of  muscle spasm involving the mid and lower thoracic paraspinal musculature region without crepitus of the thoracic region noted. Palpation over the lumbar paraspinal muscular treat and lumbar facet region associated with moderate discomfort with lateral bending rotation extension and palpation of the lumbar facets reproducing moderate discomfort. Straight leg raising was tolerated to possibly 30 without increased pain dorsiflexion noted. DTRs difficult to this patient had difficulty relaxing. There was tenderness over the region of the PSIS and PII S region a moderate degree with mild tenderness of the greater trochanteric region iliotibial band region. No sensory deficit or dermatomal disc be detected. EHL strength appeared to be equal. Negative clonus negative Homans. 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 which may reflect compression of the nerve roots on the left side of the neural foramen and asymmetric bulging L5-S1 to the right of the midline, appears to occur below the level where the nerve seems to pass through the neural foramen and left paracentral disc bulge at L3 that appears to be significantly contributing to patient's symptomatology  Lumbar radiculopathy  Lumbar facet syndrome  Depression with suicidal ideations and with recent evaluation and treatment at  [facility]  Sacroiliac joint dysfunction    PLAN   Continue present medication Norflex Zanaflex Mobic hydrocodone and Ultram  F/U PCP for evaliation of  BP and general medical  condition  F/U surgical evaluation. May consider pending follow-up evaluations  F/U neurological evaluation. May consider PNCV/EMG studies and other studies pending follow-up evaluations  May consider radiofrequency rhizolysis or intraspinal procedures pending response to present treatment and F/U evaluation   Patient to call Pain Management  Center should  patient have concerns prior to scheduled return appointment.

## 2016-01-06 NOTE — Progress Notes (Signed)
Safety precautions to be maintained throughout the outpatient stay will include: orient to surroundings, keep bed in low position, maintain call bell within reach at all times, provide assistance with transfer out of bed and ambulation.  

## 2016-01-06 NOTE — Patient Instructions (Addendum)
PLAN   Continue present medication Norflex Zanaflex Mobic hydrocodone and Ultram  F/U PCP for evaliation of  BP and general medical  condition  F/U surgical evaluation. May consider pending follow-up evaluations  F/U neurological evaluation. May consider PNCV/EMG studies and other studies pending follow-up evaluations  May consider radiofrequency rhizolysis or intraspinal procedures pending response to present treatment and F/U evaluation   Patient to call Pain Management Center should patient have concerns prior to scheduled return appointment.GENERAL RISKS AND COMPLICATIONS  What are the risk, side effects and possible complications? Generally speaking, most procedures are safe.  However, with any procedure there are risks, side effects, and the possibility of complications.  The risks and complications are dependent upon the sites that are lesioned, or the type of nerve block to be performed.  The closer the procedure is to the spine, the more serious the risks are.  Great care is taken when placing the radio frequency needles, block needles or lesioning probes, but sometimes complications can occur. 1. Infection: Any time there is an injection through the skin, there is a risk of infection.  This is why sterile conditions are used for these blocks.  There are four possible types of infection. 1. Localized skin infection. 2. Central Nervous System Infection-This can be in the form of Meningitis, which can be deadly. 3. Epidural Infections-This can be in the form of an epidural abscess, which can cause pressure inside of the spine, causing compression of the spinal cord with subsequent paralysis. This would require an emergency surgery to decompress, and there are no guarantees that the patient would recover from the paralysis. 4. Discitis-This is an infection of the intervertebral discs.  It occurs in about 1% of discography procedures.  It is difficult to treat and it may lead to  surgery.        2. Pain: the needles have to go through skin and soft tissues, will cause soreness.       3. Damage to internal structures:  The nerves to be lesioned may be near blood vessels or    other nerves which can be potentially damaged.       4. Bleeding: Bleeding is more common if the patient is taking blood thinners such as  aspirin, Coumadin, Ticiid, Plavix, etc., or if he/she have some genetic predisposition  such as hemophilia. Bleeding into the spinal canal can cause compression of the spinal  cord with subsequent paralysis.  This would require an emergency surgery to  decompress and there are no guarantees that the patient would recover from the  paralysis.       5. Pneumothorax:  Puncturing of a lung is a possibility, every time a needle is introduced in  the area of the chest or upper back.  Pneumothorax refers to free air around the  collapsed lung(s), inside of the thoracic cavity (chest cavity).  Another two possible  complications related to a similar event would include: Hemothorax and Chylothorax.   These are variations of the Pneumothorax, where instead of air around the collapsed  lung(s), you may have blood or chyle, respectively.       6. Spinal headaches: They may occur with any procedures in the area of the spine.       7. Persistent CSF (Cerebro-Spinal Fluid) leakage: This is a rare problem, but may occur  with prolonged intrathecal or epidural catheters either due to the formation of a fistulous  track or a dural tear.  8. Nerve damage: By working so close to the spinal cord, there is always a possibility of  nerve damage, which could be as serious as a permanent spinal cord injury with  paralysis.       9. Death:  Although rare, severe deadly allergic reactions known as "Anaphylactic  reaction" can occur to any of the medications used.      10. Worsening of the symptoms:  We can always make thing worse.  What are the chances of something like this  happening? Chances of any of this occuring are extremely low.  By statistics, you have more of a chance of getting killed in a motor vehicle accident: while driving to the hospital than any of the above occurring .  Nevertheless, you should be aware that they are possibilities.  In general, it is similar to taking a shower.  Everybody knows that you can slip, hit your head and get killed.  Does that mean that you should not shower again?  Nevertheless always keep in mind that statistics do not mean anything if you happen to be on the wrong side of them.  Even if a procedure has a 1 (one) in a 1,000,000 (million) chance of going wrong, it you happen to be that one..Also, keep in mind that by statistics, you have more of a chance of having something go wrong when taking medications.  Who should not have this procedure? If you are on a blood thinning medication (e.g. Coumadin, Plavix, see list of "Blood Thinners"), or if you have an active infection going on, you should not have the procedure.  If you are taking any blood thinners, please inform your physician.  How should I prepare for this procedure?  Do not eat or drink anything at least six hours prior to the procedure.  Bring a driver with you .  It cannot be a taxi.  Come accompanied by an adult that can drive you back, and that is strong enough to help you if your legs get weak or numb from the local anesthetic.  Take all of your medicines the morning of the procedure with just enough water to swallow them.  If you have diabetes, make sure that you are scheduled to have your procedure done first thing in the morning, whenever possible.  If you have diabetes, take only half of your insulin dose and notify our nurse that you have done so as soon as you arrive at the clinic.  If you are diabetic, but only take blood sugar pills (oral hypoglycemic), then do not take them on the morning of your procedure.  You may take them after you have had the  procedure.  Do not take aspirin or any aspirin-containing medications, at least eleven (11) days prior to the procedure.  They may prolong bleeding.  Wear loose fitting clothing that may be easy to take off and that you would not mind if it got stained with Betadine or blood.  Do not wear any jewelry or perfume  Remove any nail coloring.  It will interfere with some of our monitoring equipment.  NOTE: Remember that this is not meant to be interpreted as a complete list of all possible complications.  Unforeseen problems may occur.  BLOOD THINNERS The following drugs contain aspirin or other products, which can cause increased bleeding during surgery and should not be taken for 2 weeks prior to and 1 week after surgery.  If you should need take something for relief of minor  pain, you may take acetaminophen which is found in Tylenol,m Datril, Anacin-3 and Panadol. It is not blood thinner. The products listed below are.  Do not take any of the products listed below in addition to any listed on your instruction sheet.  A.P.C or A.P.C with Codeine Codeine Phosphate Capsules #3 Ibuprofen Ridaura  ABC compound Congesprin Imuran rimadil  Advil Cope Indocin Robaxisal  Alka-Seltzer Effervescent Pain Reliever and Antacid Coricidin or Coricidin-D  Indomethacin Rufen  Alka-Seltzer plus Cold Medicine Cosprin Ketoprofen S-A-C Tablets  Anacin Analgesic Tablets or Capsules Coumadin Korlgesic Salflex  Anacin Extra Strength Analgesic tablets or capsules CP-2 Tablets Lanoril Salicylate  Anaprox Cuprimine Capsules Levenox Salocol  Anexsia-D Dalteparin Magan Salsalate  Anodynos Darvon compound Magnesium Salicylate Sine-off  Ansaid Dasin Capsules Magsal Sodium Salicylate  Anturane Depen Capsules Marnal Soma  APF Arthritis pain formula Dewitt's Pills Measurin Stanback  Argesic Dia-Gesic Meclofenamic Sulfinpyrazone  Arthritis Bayer Timed Release Aspirin Diclofenac Meclomen Sulindac  Arthritis pain formula  Anacin Dicumarol Medipren Supac  Analgesic (Safety coated) Arthralgen Diffunasal Mefanamic Suprofen  Arthritis Strength Bufferin Dihydrocodeine Mepro Compound Suprol  Arthropan liquid Dopirydamole Methcarbomol with Aspirin Synalgos  ASA tablets/Enseals Disalcid Micrainin Tagament  Ascriptin Doan's Midol Talwin  Ascriptin A/D Dolene Mobidin Tanderil  Ascriptin Extra Strength Dolobid Moblgesic Ticlid  Ascriptin with Codeine Doloprin or Doloprin with Codeine Momentum Tolectin  Asperbuf Duoprin Mono-gesic Trendar  Aspergum Duradyne Motrin or Motrin IB Triminicin  Aspirin plain, buffered or enteric coated Durasal Myochrisine Trigesic  Aspirin Suppositories Easprin Nalfon Trillsate  Aspirin with Codeine Ecotrin Regular or Extra Strength Naprosyn Uracel  Atromid-S Efficin Naproxen Ursinus  Auranofin Capsules Elmiron Neocylate Vanquish  Axotal Emagrin Norgesic Verin  Azathioprine Empirin or Empirin with Codeine Normiflo Vitamin E  Azolid Emprazil Nuprin Voltaren  Bayer Aspirin plain, buffered or children's or timed BC Tablets or powders Encaprin Orgaran Warfarin Sodium  Buff-a-Comp Enoxaparin Orudis Zorpin  Buff-a-Comp with Codeine Equegesic Os-Cal-Gesic   Buffaprin Excedrin plain, buffered or Extra Strength Oxalid   Bufferin Arthritis Strength Feldene Oxphenbutazone   Bufferin plain or Extra Strength Feldene Capsules Oxycodone with Aspirin   Bufferin with Codeine Fenoprofen Fenoprofen Pabalate or Pabalate-SF   Buffets II Flogesic Panagesic   Buffinol plain or Extra Strength Florinal or Florinal with Codeine Panwarfarin   Buf-Tabs Flurbiprofen Penicillamine   Butalbital Compound Four-way cold tablets Penicillin   Butazolidin Fragmin Pepto-Bismol   Carbenicillin Geminisyn Percodan   Carna Arthritis Reliever Geopen Persantine   Carprofen Gold's salt Persistin   Chloramphenicol Goody's Phenylbutazone   Chloromycetin Haltrain Piroxlcam   Clmetidine heparin Plaquenil   Cllnoril Hyco-pap  Ponstel   Clofibrate Hydroxy chloroquine Propoxyphen         Before stopping any of these medications, be sure to consult the physician who ordered them.  Some, such as Coumadin (Warfarin) are ordered to prevent or treat serious conditions such as "deep thrombosis", "pumonary embolisms", and other heart problems.  The amount of time that you may need off of the medication may also vary with the medication and the reason for which you were taking it.  If you are taking any of these medications, please make sure you notify your pain physician before you undergo any procedures.

## 2016-01-12 ENCOUNTER — Encounter: Payer: Self-pay | Admitting: Family Medicine

## 2016-01-12 ENCOUNTER — Ambulatory Visit (INDEPENDENT_AMBULATORY_CARE_PROVIDER_SITE_OTHER): Payer: Self-pay | Admitting: Family Medicine

## 2016-01-12 VITALS — BP 146/108 | HR 107 | Temp 98.4°F | Ht 65.0 in | Wt 137.5 lb

## 2016-01-12 DIAGNOSIS — F32A Depression, unspecified: Secondary | ICD-10-CM

## 2016-01-12 DIAGNOSIS — F329 Major depressive disorder, single episode, unspecified: Secondary | ICD-10-CM

## 2016-01-12 DIAGNOSIS — F419 Anxiety disorder, unspecified: Secondary | ICD-10-CM

## 2016-01-12 MED ORDER — BUSPIRONE HCL 7.5 MG PO TABS: 7.5000 mg | ORAL_TABLET | Freq: Two times a day (BID) | ORAL | Status: DC

## 2016-01-12 MED ORDER — FLUOXETINE HCL 20 MG PO CAPS
40.0000 mg | ORAL_CAPSULE | Freq: Every day | ORAL | Status: DC
Start: 2016-01-12 — End: 2017-12-19

## 2016-01-12 NOTE — Patient Instructions (Signed)
Increase your Prozac to 40 mg daily.  Take the buspar as prescribed for your anxiety.  Follow up in 6 weeks or sooner if needed.  Take care  Dr. Adriana Simasook

## 2016-01-12 NOTE — Progress Notes (Signed)
Pre visit review using our clinic review tool, if applicable. No additional management support is needed unless otherwise documented below in the visit note. 

## 2016-01-13 DIAGNOSIS — F419 Anxiety disorder, unspecified: Secondary | ICD-10-CM | POA: Insufficient documentation

## 2016-01-13 NOTE — Assessment & Plan Note (Signed)
Established problem, worsening. Increasing Prozac to 40 mg daily.

## 2016-01-13 NOTE — Progress Notes (Signed)
Subjective:  Patient ID: Crystal Roberson, female    DOB: [birth year]  Age: 50 y.o. MRN: [id number]  CC: Depression/Anxiety  HPI:  50 year old female with a history of chronic pain presents with complaints of worsening depression and anxiety.  Patient states that for the past months she's been experiencing worsening depression and anxiety. She states that this worsened after her neck pain worsened and she had have her procedure. She states that she has little interest in doing things and feels down. She also feels quite "jittery". She is tearful today. She endorses compliance with Prozac but states that it is not helping. She would like to discuss treatment options today.  Social Hx   Social History   Social History  . Marital Status: Divorced    Spouse Name: N/A  . Number of Children: N/A  . Years of Education: N/A   Social History Main Topics  . Smoking status: Current Every Day Smoker -- 0.75 packs/day for 30 years    Types: Cigarettes  . Smokeless tobacco: Never Used  . Alcohol Use: No  . Drug Use: No  . Sexual Activity: Not Currently   Other Topics Concern  . None   Social History Narrative   Review of Systems  Musculoskeletal: Positive for back pain.  Psychiatric/Behavioral:       Anxious; depressed.   Objective:  BP 146/108 mmHg  Pulse 107  Temp(Src) 98.4 F (36.9 C) (Oral)  Ht 5\' 5"  (1.651 m)  Wt 137 lb 8 oz (62.37 kg)  BMI 22.88 kg/m2  SpO2 98%  BP/Weight 01/12/2016 01/06/2016 12/13/2015  Systolic BP 146 160 175  Diastolic BP 108 87 98  Wt. (Lbs) 137.5 139 141  BMI 22.88 23.13 23.46   Physical Exam  Constitutional: She appears well-developed. No distress.  HENT:  Head: Normocephalic and atraumatic.  Eyes: Conjunctivae are normal. No scleral icterus.  Pulmonary/Chest: Effort normal.  Neurological: She is alert.  Psychiatric:  Depressed mood.  Tearful today.  Vitals reviewed.   Lab Results  Component Value Date   WBC 8.0 11/05/2015   HGB 14.2  11/05/2015   HCT 42.8 11/05/2015   PLT 218.0 11/05/2015   GLUCOSE 117* 11/09/2015   CHOL 245* 11/05/2015   TRIG 140.0 11/05/2015   HDL 54.30 11/05/2015   LDLCALC 163* 11/05/2015   ALT 12 11/09/2015   AST 16 11/09/2015   NA 140 11/09/2015   K 3.0* 11/09/2015   CL 102 11/09/2015   CREATININE 0.74 11/09/2015   BUN 11 11/09/2015   CO2 32 11/09/2015    Assessment & Plan:   Problem List Items Addressed This Visit    Depression - Primary    Established problem, worsening. Increasing Prozac to 40 mg daily.       Relevant Medications   FLUoxetine (PROZAC) 20 MG capsule   busPIRone (BUSPAR) 7.5 MG tablet   Anxiety    New problem (to me). Adding Buspar.      Relevant Medications   FLUoxetine (PROZAC) 20 MG capsule   busPIRone (BUSPAR) 7.5 MG tablet      Meds ordered this encounter  Medications  . FLUoxetine (PROZAC) 20 MG capsule    Sig: Take 2 capsules (40 mg total) by mouth daily.    Dispense:  180 capsule    Refill:  1  . busPIRone (BUSPAR) 7.5 MG tablet    Sig: Take 1 tablet (7.5 mg total) by mouth 2 (two) times daily.    Dispense:  60 tablet  Refill:  1    Follow-up: 6 weeks.  Everlene Other DO [facility]

## 2016-01-13 NOTE — Assessment & Plan Note (Signed)
New problem (to me). Adding Buspar.

## 2016-02-03 ENCOUNTER — Ambulatory Visit: Payer: Self-pay | Attending: Pain Medicine | Admitting: Pain Medicine

## 2016-02-03 ENCOUNTER — Encounter: Payer: Self-pay | Admitting: Pain Medicine

## 2016-02-03 VITALS — BP 184/96 | HR 84 | Temp 98.0°F | Resp 16 | Ht 65.0 in | Wt 137.0 lb

## 2016-02-03 DIAGNOSIS — M5416 Radiculopathy, lumbar region: Secondary | ICD-10-CM

## 2016-02-03 DIAGNOSIS — M47816 Spondylosis without myelopathy or radiculopathy, lumbar region: Secondary | ICD-10-CM

## 2016-02-03 DIAGNOSIS — G588 Other specified mononeuropathies: Secondary | ICD-10-CM

## 2016-02-03 DIAGNOSIS — M51379 Other intervertebral disc degeneration, lumbosacral region without mention of lumbar back pain or lower extremity pain: Secondary | ICD-10-CM

## 2016-02-03 DIAGNOSIS — M51369 Other intervertebral disc degeneration, lumbar region without mention of lumbar back pain or lower extremity pain: Secondary | ICD-10-CM

## 2016-02-03 DIAGNOSIS — M5137 Other intervertebral disc degeneration, lumbosacral region: Secondary | ICD-10-CM

## 2016-02-03 DIAGNOSIS — M5136 Other intervertebral disc degeneration, lumbar region: Secondary | ICD-10-CM

## 2016-02-03 DIAGNOSIS — M533 Sacrococcygeal disorders, not elsewhere classified: Secondary | ICD-10-CM

## 2016-02-03 DIAGNOSIS — M5481 Occipital neuralgia: Secondary | ICD-10-CM

## 2016-02-03 MED ORDER — TIZANIDINE HCL 2 MG PO TABS: ORAL_TABLET | ORAL | Status: DC

## 2016-02-03 MED ORDER — ORPHENADRINE CITRATE ER 100 MG PO TB12: ORAL_TABLET | ORAL | Status: DC

## 2016-02-03 MED ORDER — TRAMADOL HCL 50 MG PO TABS: ORAL_TABLET | ORAL | Status: DC

## 2016-02-03 MED ORDER — HYDROCODONE-ACETAMINOPHEN 5-325 MG PO TABS: ORAL_TABLET | ORAL | Status: DC

## 2016-02-03 MED ORDER — MELOXICAM 7.5 MG PO TABS: ORAL_TABLET | ORAL | Status: DC

## 2016-02-03 NOTE — Progress Notes (Signed)
   Subjective:    Patient ID: Crystal Roberson, female    DOB: 10-31-1965, 50 y.o.   MRN: [id number]  HPI  The patient is a 50 year old female who returns to pain management for further evaluation and treatment of pain involving the region of the upper mid lower back and lower extremity region with occasional headaches as well. The patient states that she continues to do remarkably well and is without significant disabling pain. The patient states that she has been able to perform activities of daily living including chores around the house and participate in family activities without experiencing severe disabling pain. The patient stated that despite the damp weather that she has been able to function without experiencing any severe pain. We will continue medications as prescribed at this time Norflex  Mobic Zanaflex and hydrocodone acetaminophen and Ultram. Consider patient for modification of treatment regimen and interventional treatment should they be return of any significant pain. All agreed to suggested treatment plan.  Review of Systems     Objective:   Physical Exam  There was mild tenderness to palpation of paraspinal misreading cervical and cervical facet region. There was mild tenderness of the splenius capitis and occipitalis musculature regions. Palpation over the region of the thoracic region was with mild tenderness to palpation and mild muscle spasms involving the upper mid and lower thoracic regions. No crepitus of the thoracic region was noted. Palpation over the acromioclavicular and glenohumeral joint regions reproduce mild discomfort and patient was with unremarkable Spurling's maneuver and was able to perform drop test without significant difficulty. Palpation over the lumbar paraspinal musculature region was with tenderness to palpation of mild degree. There was tenderness over the region of the PSIS and PII S region a mild degree and mild tenderness of the greater trochanteric  region iliotibial band region. There was no significant increase of pain with pressure applied to the ileum with patient in lateral decubitus position. Straight leg raise was tolerates approximately 30 without an increase of pain with dorsiflexion noted. DTRs appeared to be trace at the knees. There was negative clonus negative Homans. No sensory deficit of dermatomal distribution detected. Abdomen nontender with no costovertebral tenderness noted.    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 Adriana Simasook for evaliation of  BP and general medical  condition. Please see primary care physician regarding blood pressure as discussed  F/U surgical evaluation. May consider pending follow-up evaluations  F/U neurological evaluation. May consider PNCV/EMG studies and other studies pending follow-up evaluations  May consider radiofrequency rhizolysis or intraspinal procedures pending response to present treatment and F/U evaluation   Patient to call [facility] Management Center should patient have concerns prior to scheduled return appointment

## 2016-02-03 NOTE — Patient Instructions (Addendum)
PLAN   Continue present medication Norflex Zanaflex Mobic hydrocodone and Ultram  F/U PCP Dr. Adriana Simasook for evaliation of  BP and general medical  condition. Please see primary care physician regarding blood pressure as discussed  F/U surgical evaluation. May consider pending follow-up evaluations  F/U neurological evaluation. May consider PNCV/EMG studies and other studies pending follow-up evaluations  May consider radiofrequency rhizolysis or intraspinal procedures pending response to present treatment and F/U evaluation   Patient to call [facility] Management Center should patient have concerns prior to scheduled return appointment.. The patient continues

## 2016-02-03 NOTE — Progress Notes (Signed)
Safety precautions to be maintained throughout the outpatient stay will include: orient to surroundings, keep bed in low position, maintain call bell within reach at all times, provide assistance with transfer out of bed and ambulation.  

## 2016-02-12 LAB — TOXASSURE SELECT 13 (MW), URINE

## 2016-02-14 NOTE — Progress Notes (Signed)
Quick Note:  Reviewed. ______ 

## 2016-02-29 ENCOUNTER — Telehealth: Payer: Self-pay | Admitting: Pain Medicine

## 2016-02-29 ENCOUNTER — Telehealth: Payer: Self-pay | Admitting: *Deleted

## 2016-02-29 ENCOUNTER — Other Ambulatory Visit: Payer: Self-pay | Admitting: Pain Medicine

## 2016-02-29 DIAGNOSIS — M5416 Radiculopathy, lumbar region: Secondary | ICD-10-CM

## 2016-02-29 DIAGNOSIS — M533 Sacrococcygeal disorders, not elsewhere classified: Secondary | ICD-10-CM

## 2016-02-29 DIAGNOSIS — M5136 Other intervertebral disc degeneration, lumbar region: Secondary | ICD-10-CM

## 2016-02-29 DIAGNOSIS — G588 Other specified mononeuropathies: Secondary | ICD-10-CM

## 2016-02-29 DIAGNOSIS — M5481 Occipital neuralgia: Secondary | ICD-10-CM

## 2016-02-29 DIAGNOSIS — M5137 Other intervertebral disc degeneration, lumbosacral region: Secondary | ICD-10-CM

## 2016-02-29 DIAGNOSIS — M47816 Spondylosis without myelopathy or radiculopathy, lumbar region: Secondary | ICD-10-CM

## 2016-02-29 NOTE — Telephone Encounter (Signed)
Nurses Olegario MessierKathy and BarlowShatoya  Please describe patient's pain in greater detail. Is pain involving the mid and lower back more or does the pain involve the lower back region and region of the gluteal muscles and lower extremity region more? Please discussed with me so that we can decide which procedure will be best for patient. Thank you Please schedule patient for procedure: Monday or Wednesday, June 19 on June 21

## 2016-02-29 NOTE — Telephone Encounter (Signed)
Arabella MerlesLori Kathy [place] and Angie Please schedule patient for lumbosacral selective nerve root block for June 19 of June 21

## 2016-02-29 NOTE — Telephone Encounter (Signed)
Lower back to mid back and left hip pain, would like to come in for procedure Has med refill appt Thurs at 8:15

## 2016-03-01 NOTE — Telephone Encounter (Signed)
Attempted to call patient, no answer 

## 2016-03-02 ENCOUNTER — Ambulatory Visit: Payer: Self-pay | Attending: Pain Medicine | Admitting: Pain Medicine

## 2016-03-02 ENCOUNTER — Encounter: Payer: Self-pay | Admitting: Pain Medicine

## 2016-03-02 VITALS — BP 149/88 | HR 84 | Temp 97.5°F | Resp 16 | Ht 65.0 in | Wt 138.0 lb

## 2016-03-02 DIAGNOSIS — M47896 Other spondylosis, lumbar region: Secondary | ICD-10-CM | POA: Insufficient documentation

## 2016-03-02 DIAGNOSIS — M5136 Other intervertebral disc degeneration, lumbar region: Secondary | ICD-10-CM

## 2016-03-02 DIAGNOSIS — M5126 Other intervertebral disc displacement, lumbar region: Secondary | ICD-10-CM | POA: Insufficient documentation

## 2016-03-02 DIAGNOSIS — M5481 Occipital neuralgia: Secondary | ICD-10-CM

## 2016-03-02 DIAGNOSIS — M533 Sacrococcygeal disorders, not elsewhere classified: Secondary | ICD-10-CM | POA: Insufficient documentation

## 2016-03-02 DIAGNOSIS — M47816 Spondylosis without myelopathy or radiculopathy, lumbar region: Secondary | ICD-10-CM

## 2016-03-02 DIAGNOSIS — G588 Other specified mononeuropathies: Secondary | ICD-10-CM

## 2016-03-02 DIAGNOSIS — M5416 Radiculopathy, lumbar region: Secondary | ICD-10-CM

## 2016-03-02 DIAGNOSIS — M79605 Pain in left leg: Secondary | ICD-10-CM | POA: Insufficient documentation

## 2016-03-02 DIAGNOSIS — M5127 Other intervertebral disc displacement, lumbosacral region: Secondary | ICD-10-CM | POA: Insufficient documentation

## 2016-03-02 DIAGNOSIS — M5137 Other intervertebral disc degeneration, lumbosacral region: Secondary | ICD-10-CM

## 2016-03-02 MED ORDER — TIZANIDINE HCL 2 MG PO TABS: ORAL_TABLET | ORAL | Status: DC

## 2016-03-02 MED ORDER — MELOXICAM 7.5 MG PO TABS: ORAL_TABLET | ORAL | Status: DC

## 2016-03-02 MED ORDER — HYDROCODONE-ACETAMINOPHEN 5-325 MG PO TABS: ORAL_TABLET | ORAL | Status: DC

## 2016-03-02 MED ORDER — TRAMADOL HCL 50 MG PO TABS: ORAL_TABLET | ORAL | Status: DC

## 2016-03-02 NOTE — Progress Notes (Signed)
Subjective:    Patient ID: Crystal Roberson, female    DOB: 22-Mar-1966, 50 y.o.   MRN: [id number]  HPI  The patient is a 50 year old female who returns to pain management for further evaluation and treatment of pain involving the mid lower back and lower extremity regions. Patient states that she has exacerbation of her pain of the mid and lower back region with pain radiating to the lower extremities with standing and walking. The patient is unable to recall any event which may have precipitated exacerbation of her pain. Patient stated that she had been doing very well following previous treatment and that she had increased her activities which may have contributed to exacerbation of pain. The patient was unable to identify any particular event which may have caused exacerbation of her pain. We will continue medications consisting of Norflex  Mobic Zanaflex and hydrocodone acetaminophen and Ultram.. Patient stated that the pain became more intense with standing walking with pain associated with spasms and radiation of pain of the lower extremities. We will consider patient for lumbosacral selective nerve root blocks to be performed at time return appointment in attempt to decrease severity of symptoms, minimize progression of symptoms, and avoid the need for more involved treatment. All agreed to suggested treatment plan     Review of Systems     Objective:   Physical Exam  There was tends to palpation of paraspinal musculature in the cervical region cervical facet region a mild to moderate degree with mild to moderate tenderness of the splenius capitis and occipitalis region. Palpation of the acromioclavicular and glenohumeral joint regions the patient appeared to be with bilaterally equal grip strength with Tinel and Phalen's maneuver reproducing minimal discomfort reproduces moderate discomfort there was tends to palpation of the thoracic facet thoracic paraspinal musculature region a moderate  degree without crepitus of the thoracic region noted. The patient appeared to be with unremarkable Spurling's maneuver. Palpation of the lumbar paraspinal muscular treat and lumbar facet region was with moderate to be severe tenderness to palpation with palpation over the PSIS and PII S region reproduces moderate discomfort. Straight leg raising was tolerates approximately 20 with questionably increased pain with dorsiflexion of the left lower extremity. EHL strength appeared to be slightly decreased. No definite sensory deficit or dermatomal dystrophy detected. DTRs were difficult to elicit. There was mild tenderness to palpation greater trochanteric region iliotibial band region. 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 which may reflect compression of the nerve roots on the left side of the neural foramen and asymmetric bulging L5-S1 to the right of the midline, appears to occur below the level where the nerve seems to pass through the neural foramen and left paracentral disc bulge at L3 that appears to be significantly contributing to patient's symptomatology  Lumbar radiculopathy  Lumbar facet syndrome  Sacroiliac joint dysfunction      PLAN   Continue present medication Norflex Zanaflex Mobic hydrocodone and Ultram  Lumbosacral selective nerve root block to be performed at time of return appointment  F/U PCP Dr. Adriana Simas for evaliation of  BP and general medical  condition.   F/U surgical evaluation. May consider pending follow-up evaluations  F/U neurological evaluation. May consider PNCV/EMG studies and other studies pending follow-up evaluations  May consider radiofrequency rhizolysis or intraspinal procedures pending response to present treatment and F/U evaluation   Patient to  call Pain Management Center should patient have concerns prior to scheduled return  appointment..Marland Kitchen

## 2016-03-02 NOTE — Patient Instructions (Addendum)
PLAN   Continue present medication Norflex Zanaflex Mobic hydrocodone and Ultram  Lumbosacral selective nerve root block to be performed at time of return appointment  F/U PCP Dr. Adriana Simasook for evaliation of  BP and general medical  condition.   F/U surgical evaluation. May consider pending follow-up evaluations  F/U neurological evaluation. May consider PNCV/EMG studies and other studies pending follow-up evaluations  May consider radiofrequency rhizolysis or intraspinal procedures pending response to present treatment and F/U evaluation   Patient to call Pain Management Center should patient have concerns prior to scheduled return appointment.. Selective Nerve Root Block Patient Information  Description: Specific nerve roots exit the spinal canal and these nerves can be compressed and inflamed by a bulging disc and bone spurs.  By injecting steroids on the nerve root, we can potentially decrease the inflammation surrounding these nerves, which often leads to decreased pain.  Also, by injecting local anesthesia on the nerve root, this can provide us helpful information to give to your referring doctor if it decreases your pain.  Selective nerve root blocks can be done along the spine from the neck to the low back depending on the location of your pain.   After numbing the skin with local anesthesia, a small needle is passed to the nerve root and the position of the needle is verified using x-ray pictures.  After the needle is in correct position, we then deposit the medication.  You may experience a pressure sensation while this is being done.  The entire block usually lasts less than 15 minutes.  Conditions that may be treated with selective nerve root blocks:  Low back and leg pain  Spinal stenosis  Diagnostic block prior to potential surgery  Neck and arm pain  Post laminectomy syndrome  Preparation for the injection:  1. Do not eat any solid food or dairy products within 8 hours of  your appointment. 2. You may drink clear liquids up to 3 hours before an appointment.  Clear liquids include water, black coffee, juice or soda.  No milk or cream please. 3. You may take your regular medications, including pain medications, with a sip of water before your appointment.  Diabetics should hold regular insulin (if taken separately) and take 1/2 normal NPH dose the morning of the procedure.  Carry some sugar containing items with you to your appointment. 4. A driver must accompany you and be prepared to drive you home after your procedure. 5. Bring all your current medications with you. 6. An IV may be inserted and sedation may be given at the discretion of the physician. 7. A blood pressure cuff, EKG, and other monitors will often be applied during the procedure.  Some patients may need to have extra oxygen administered for a short period. 8. You will be asked to provide medical information, including allergies, prior to the procedure.  We must know immediately if you are taking blood  Thinners (like Coumadin) or if you are allergic to IV iodine contrast (dye).  Possible side-effects: All are usually temporary  Bleeding from needle site  Light headedness  Numbness and tingling  Decreased blood pressure  Weakness in arms/legs  Pressure sensation in back/neck  Pain at injection site (several days)  Possible complications: All are extremely rare  Infection  Nerve injury  Spinal headache (a headache wore with upright position)  Call if you experience:  Fever/chills associated with headache or increased back/neck pain  Headache worsened by an upright position  New onset weakness or  numbness of an extremity below the injection site  Hives or difficulty breathing (go to the emergency room)  Inflammation or drainage at the injection site(s)  Severe back/neck pain greater than usual  New symptoms which are concerning to you  Please note:  Although the local  anesthetic injected can often make your back or neck feel good for several hours after the injection the pain will likely return.  It takes 3-5 days for steroids to work on the nerve root. You may not notice any pain relief for at least one week.  If effective, we will often do a series of 3 injections spaced 3-6 weeks apart to maximally decrease your pain.    If you have any questions, please call [phone] [facility] R[facility] AND COM[facility]S  What are the risk, side effects and possible complications? Generally speaking, most procedures are safe.  However, with any procedure there are risks, side effects, and the possibility of complications.  The risks and complications are dependent upon the sites that are lesioned, or the type of nerve block to be performed.  The closer the procedure is to the spine, the more serious the risks are.  Great care is taken when placing the radio frequency needles, block needles or lesioning probes, but sometimes complications can occur. 1. Infection: Any time there is an injection through the skin, there is a risk of infection.  This is why sterile conditions are used for these blocks.  There are four possible types of infection. 1. Localized skin infection. 2. Central Nervous System Infection-This can be in the form of Meningitis, which can be deadly. 3. Epidural Infections-This can be in the form of an epidural abscess, which can cause pressure inside of the spine, causing compression of the spinal cord with subsequent paralysis. This would require an emergency surgery to decompress, and there are no guarantees that the patient would recover from the paralysis. 4. Discitis-This is an infection of the intervertebral discs.  It occurs in about 1% of discography procedures.  It is difficult to treat and it may lead to surgery.        2. Pain: the needles have to go through skin and soft tissues, will cause soreness.        3. Damage to internal structures:  The nerves to be lesioned may be near blood vessels or    other nerves which can be potentially damaged.       4. Bleeding: Bleeding is more common if the patient is taking blood thinners such as  aspirin, Coumadin, Ticiid, Plavix, etc., or if he/she have some genetic predisposition  such as hemophilia. Bleeding into the spinal canal can cause compression of the spinal  cord with subsequent paralysis.  This would require an emergency surgery to  decompress and there are no guarantees that the patient would recover from the  paralysis.       5. Pneumothorax:  Puncturing of a lung is a possibility, every time a needle is introduced in  the area of the chest or upper back.  Pneumothorax refers to free air around the  collapsed lung(s), inside of the thoracic cavity (chest cavity).  Another two possible  complications related to a similar event would include: Hemothorax and Chylothorax.   These are variations of the Pneumothorax, where instead of air around the collapsed  lung(s), you may have blood or chyle, respectively.       6. Spinal headaches: They may occur with any  procedures in the area of the spine.       7. Persistent CSF (Cerebro-Spinal Fluid) leakage: This is a rare problem, but may occur  with prolonged intrathecal or epidural catheters either due to the formation of a fistulous  track or a dural tear.       8. Nerve damage: By working so close to the spinal cord, there is always a possibility of  nerve damage, which could be as serious as a permanent spinal cord injury with  paralysis.       9. Death:  Although rare, severe deadly allergic reactions known as "Anaphylactic  reaction" can occur to any of the medications used.      10. Worsening of the symptoms:  We can always make thing worse.  What are the chances of something like this happening? Chances of any of this occuring are extremely low.  By statistics, you have more of a chance of getting killed in a  motor vehicle accident: while driving to the hospital than any of the above occurring .  Nevertheless, you should be aware that they are possibilities.  In general, it is similar to taking a shower.  Everybody knows that you can slip, hit your head and get killed.  Does that mean that you should not shower again?  Nevertheless always keep in mind that statistics do not mean anything if you happen to be on the wrong side of them.  Even if a procedure has a 1 (one) in a 1,000,000 (million) chance of going wrong, it you happen to be that one..Also, keep in mind that by statistics, you have more of a chance of having something go wrong when taking medications.  Who should not have this procedure? If you are on a blood thinning medication (e.g. Coumadin, Plavix, see list of "Blood Thinners"), or if you have an active infection going on, you should not have the procedure.  If you are taking any blood thinners, please inform your physician.  How should I prepare for this procedure?  Do not eat or drink anything at least six hours prior to the procedure.  Bring a driver with you .  It cannot be a taxi.  Come accompanied by an adult that can drive you back, and that is strong enough to help you if your legs get weak or numb from the local anesthetic.  Take all of your medicines the morning of the procedure with just enough water to swallow them.  If you have diabetes, make sure that you are scheduled to have your procedure done first thing in the morning, whenever possible.  If you have diabetes, take only half of your insulin dose and notify our nurse that you have done so as soon as you arrive at the clinic.  If you are diabetic, but only take blood sugar pills (oral hypoglycemic), then do not take them on the morning of your procedure.  You may take them after you have had the procedure.  Do not take aspirin or any aspirin-containing medications, at least eleven (11) days prior to the procedure.  They  may prolong bleeding.  Wear loose fitting clothing that may be easy to take off and that you would not mind if it got stained with Betadine or blood.  Do not wear any jewelry or perfume  Remove any nail coloring.  It will interfere with some of our monitoring equipment.  NOTE: Remember that this is not meant to be interpreted as a complete  list of all possible complications.  Unforeseen problems may occur.  BLOOD THINNERS The following drugs contain aspirin or other products, which can cause increased bleeding during surgery and should not be taken for 2 weeks prior to and 1 week after surgery.  If you should need take something for relief of minor pain, you may take acetaminophen which is found in Tylenol,m Datril, Anacin-3 and Panadol. It is not blood thinner. The products listed below are.  Do not take any of the products listed below in addition to any listed on your instruction sheet.  A.P.C or A.P.C with Codeine Codeine Phosphate Capsules #3 Ibuprofen Ridaura  ABC compound Congesprin Imuran rimadil  Advil Cope Indocin Robaxisal  Alka-Seltzer Effervescent Pain Reliever and Antacid Coricidin or Coricidin-D  Indomethacin Rufen  Alka-Seltzer plus Cold Medicine Cosprin Ketoprofen S-A-C Tablets  Anacin Analgesic Tablets or Capsules Coumadin Korlgesic Salflex  Anacin Extra Strength Analgesic tablets or capsules CP-2 Tablets Lanoril Salicylate  Anaprox Cuprimine Capsules Levenox Salocol  Anexsia-D Dalteparin Magan Salsalate  Anodynos Darvon compound Magnesium Salicylate Sine-off  Ansaid Dasin Capsules Magsal Sodium Salicylate  Anturane Depen Capsules Marnal Soma  APF Arthritis pain formula Dewitt's Pills Measurin Stanback  Argesic Dia-Gesic Meclofenamic Sulfinpyrazone  Arthritis Bayer Timed Release Aspirin Diclofenac Meclomen Sulindac  Arthritis pain formula Anacin Dicumarol Medipren Supac  Analgesic (Safety coated) Arthralgen Diffunasal Mefanamic Suprofen  Arthritis Strength Bufferin  Dihydrocodeine Mepro Compound Suprol  Arthropan liquid Dopirydamole Methcarbomol with Aspirin Synalgos  ASA tablets/Enseals Disalcid Micrainin Tagament  Ascriptin Doan's Midol Talwin  Ascriptin A/D Dolene Mobidin Tanderil  Ascriptin Extra Strength Dolobid Moblgesic Ticlid  Ascriptin with Codeine Doloprin or Doloprin with Codeine Momentum Tolectin  Asperbuf Duoprin Mono-gesic Trendar  Aspergum Duradyne Motrin or Motrin IB Triminicin  Aspirin plain, buffered or enteric coated Durasal Myochrisine Trigesic  Aspirin Suppositories Easprin Nalfon Trillsate  Aspirin with Codeine Ecotrin Regular or Extra Strength Naprosyn Uracel  Atromid-S Efficin Naproxen Ursinus  Auranofin Capsules Elmiron Neocylate Vanquish  Axotal Emagrin Norgesic Verin  Azathioprine Empirin or Empirin with Codeine Normiflo Vitamin E  Azolid Emprazil Nuprin Voltaren  Bayer Aspirin plain, buffered or children's or timed BC Tablets or powders Encaprin Orgaran Warfarin Sodium  Buff-a-Comp Enoxaparin Orudis Zorpin  Buff-a-Comp with Codeine Equegesic Os-Cal-Gesic   Buffaprin Excedrin plain, buffered or Extra Strength Oxalid   Bufferin Arthritis Strength Feldene Oxphenbutazone   Bufferin plain or Extra Strength Feldene Capsules Oxycodone with Aspirin   Bufferin with Codeine Fenoprofen Fenoprofen Pabalate or Pabalate-SF   Buffets II Flogesic Panagesic   Buffinol plain or Extra Strength Florinal or Florinal with Codeine Panwarfarin   Buf-Tabs Flurbiprofen Penicillamine   Butalbital Compound Four-way cold tablets Penicillin   Butazolidin Fragmin Pepto-Bismol   Carbenicillin Geminisyn Percodan   Carna Arthritis Reliever Geopen Persantine   Carprofen Gold's salt Persistin   Chloramphenicol Goody's Phenylbutazone   Chloromycetin Haltrain Piroxlcam   Clmetidine heparin Plaquenil   Cllnoril Hyco-pap Ponstel   Clofibrate Hydroxy chloroquine Propoxyphen         Before stopping any of these medications, be sure to consult the  physician who ordered them.  Some, such as Coumadin (Warfarin) are ordered to prevent or treat serious conditions such as "deep thrombosis", "pumonary embolisms", and other heart problems.  The amount of time that you may need off of the medication may also vary with the medication and the reason for which you were taking it.  If you are taking any of these medications, please make sure you notify your pain physician before you undergo  any procedures.

## 2016-03-15 ENCOUNTER — Encounter: Payer: Self-pay | Admitting: Pain Medicine

## 2016-03-15 ENCOUNTER — Ambulatory Visit: Payer: Self-pay | Attending: Pain Medicine | Admitting: Pain Medicine

## 2016-03-15 VITALS — BP 160/93 | HR 69 | Temp 96.2°F | Resp 14 | Ht 65.0 in | Wt 140.0 lb

## 2016-03-15 DIAGNOSIS — G588 Other specified mononeuropathies: Secondary | ICD-10-CM

## 2016-03-15 DIAGNOSIS — M545 Low back pain: Secondary | ICD-10-CM | POA: Insufficient documentation

## 2016-03-15 DIAGNOSIS — M5126 Other intervertebral disc displacement, lumbar region: Secondary | ICD-10-CM | POA: Insufficient documentation

## 2016-03-15 DIAGNOSIS — M5137 Other intervertebral disc degeneration, lumbosacral region: Secondary | ICD-10-CM

## 2016-03-15 DIAGNOSIS — M5136 Other intervertebral disc degeneration, lumbar region: Secondary | ICD-10-CM | POA: Insufficient documentation

## 2016-03-15 DIAGNOSIS — M47816 Spondylosis without myelopathy or radiculopathy, lumbar region: Secondary | ICD-10-CM

## 2016-03-15 DIAGNOSIS — M533 Sacrococcygeal disorders, not elsewhere classified: Secondary | ICD-10-CM

## 2016-03-15 DIAGNOSIS — M5416 Radiculopathy, lumbar region: Secondary | ICD-10-CM

## 2016-03-15 DIAGNOSIS — M5481 Occipital neuralgia: Secondary | ICD-10-CM

## 2016-03-15 MED ORDER — ORPHENADRINE CITRATE 30 MG/ML IJ SOLN
60.0000 mg | Freq: Once | INTRAMUSCULAR | Status: AC
Start: 2016-03-15 — End: 2016-03-15
  Administered 2016-03-15: 60 mg via INTRAMUSCULAR
  Filled 2016-03-15: qty 2

## 2016-03-15 MED ORDER — TRIAMCINOLONE ACETONIDE 40 MG/ML IJ SUSP
40.0000 mg | Freq: Once | INTRAMUSCULAR | Status: AC
  Administered 2016-03-15: 40 mg
  Filled 2016-03-15: qty 1

## 2016-03-15 MED ORDER — LACTATED RINGERS IV SOLN: 1000.0000 mL | INTRAVENOUS | Status: DC

## 2016-03-15 MED ORDER — BUPIVACAINE HCL (PF) 0.25 % IJ SOLN
30.0000 mL | Freq: Once | INTRAMUSCULAR | Status: AC
  Administered 2016-03-15: 30 mL
  Filled 2016-03-15: qty 30

## 2016-03-15 MED ORDER — LIDOCAINE HCL (PF) 1 % IJ SOLN
10.0000 mL | Freq: Once | INTRAMUSCULAR | Status: AC
  Administered 2016-03-15: 10 mL via SUBCUTANEOUS
  Filled 2016-03-15: qty 10

## 2016-03-15 MED ORDER — FENTANYL CITRATE (PF) 100 MCG/2ML IJ SOLN
100.0000 ug | Freq: Once | INTRAMUSCULAR | Status: AC
  Administered 2016-03-15: 100 ug via INTRAVENOUS
  Filled 2016-03-15: qty 2

## 2016-03-15 MED ORDER — MIDAZOLAM HCL 5 MG/5ML IJ SOLN
5.0000 mg | Freq: Once | INTRAMUSCULAR | Status: AC
  Administered 2016-03-15: 5 mg via INTRAVENOUS
  Filled 2016-03-15: qty 5

## 2016-03-15 NOTE — Progress Notes (Signed)
Safety precautions to be maintained throughout the outpatient stay will include: orient to surroundings, keep bed in low position, maintain call bell within reach at all times, provide assistance with transfer out of bed and ambulation.  

## 2016-03-15 NOTE — Patient Instructions (Addendum)
PLAN   Continue present medication Norflex Zanaflex Mobic hydrocodone and Ultram  F/U PCP Dr. Adriana Simasook for evaliation of  BP and general medical  condition.   F/U surgical evaluation. May consider pending follow-up evaluations  F/U neurological evaluation. May consider PNCV/EMG studies and other studies pending follow-up evaluations  May consider radiofrequency rhizolysis or intraspinal procedures pending response to present treatment and F/U evaluation   Patient to call [facility] should patient have concerns prior to scheduled return appointment.Pain Management Discharge Instructions  General Discharge Instructions :  If you need to reach your doctor call: Monday-Friday 8:00 am - 4:00 pm at [phone] or toll free [phone].  After clinic hours [phone] to have operator reach doctor.  Bring all of your medication bottles to all your appointments in the pain clinic.  To cancel or reschedule your appointment with Pain Management please remember to call 24 hours in advance to avoid a fee.  Refer to the educational materials which you have been given on: General Risks, I had my Procedure. Discharge Instructions, Post Sedation.  Post Procedure Instructions:  The drugs you were given will stay in your system until tomorrow, so for the next 24 hours you should not drive, make any legal decisions or drink any alcoholic beverages.  You may eat anything you prefer, but it is better to start with liquids then soups and crackers, and gradually work up to solid foods.  Please notify your doctor immediately if you have any unusual bleeding, trouble breathing or pain that is not related to your normal pain.  Depending on the type of procedure that was done, some parts of your body may feel week and/or numb.  This usually clears up by tonight or the next day.  Walk with the use of an assistive device or accompanied by an adult for the 24 hours.  You may use ice on the  affected area for the first 24 hours.  Put ice in a Ziploc bag and cover with a towel and place against area 15 minutes on 15 minutes off.  You may switch to heat after 24 hours.GENERAL RISKS AND COMPLICATIONS  What are the risk, side effects and possible complications? Generally speaking, most procedures are safe.  However, with any procedure there are risks, side effects, and the possibility of complications.  The risks and complications are dependent upon the sites that are lesioned, or the type of nerve block to be performed.  The closer the procedure is to the spine, the more serious the risks are.  Great care is taken when placing the radio frequency needles, block needles or lesioning probes, but sometimes complications can occur. 1. Infection: Any time there is an injection through the skin, there is a risk of infection.  This is why sterile conditions are used for these blocks.  There are four possible types of infection. 1. Localized skin infection. 2. Central Nervous System Infection-This can be in the form of Meningitis, which can be deadly. 3. Epidural Infections-This can be in the form of an epidural abscess, which can cause pressure inside of the spine, causing compression of the spinal cord with subsequent paralysis. This would require an emergency surgery to decompress, and there are no guarantees that the patient would recover from the paralysis. 4. Discitis-This is an infection of the intervertebral discs.  It occurs in about 1% of discography procedures.  It is difficult to treat and it may lead to surgery.        2. Pain:  the needles have to go through skin and soft tissues, will cause soreness.       3. Damage to internal structures:  The nerves to be lesioned may be near blood vessels or    other nerves which can be potentially damaged.       4. Bleeding: Bleeding is more common if the patient is taking blood thinners such as  aspirin, Coumadin, Ticiid, Plavix, etc., or if he/she  have some genetic predisposition  such as hemophilia. Bleeding into the spinal canal can cause compression of the spinal  cord with subsequent paralysis.  This would require an emergency surgery to  decompress and there are no guarantees that the patient would recover from the  paralysis.       5. Pneumothorax:  Puncturing of a lung is a possibility, every time a needle is introduced in  the area of the chest or upper back.  Pneumothorax refers to free air around the  collapsed lung(s), inside of the thoracic cavity (chest cavity).  Another two possible  complications related to a similar event would include: Hemothorax and Chylothorax.   These are variations of the Pneumothorax, where instead of air around the collapsed  lung(s), you may have blood or chyle, respectively.       6. Spinal headaches: They may occur with any procedures in the area of the spine.       7. Persistent CSF (Cerebro-Spinal Fluid) leakage: This is a rare problem, but may occur  with prolonged intrathecal or epidural catheters either due to the formation of a fistulous  track or a dural tear.       8. Nerve damage: By working so close to the spinal cord, there is always a possibility of  nerve damage, which could be as serious as a permanent spinal cord injury with  paralysis.       9. Death:  Although rare, severe deadly allergic reactions known as "Anaphylactic  reaction" can occur to any of the medications used.      10. Worsening of the symptoms:  We can always make thing worse.  What are the chances of something like this happening? Chances of any of this occuring are extremely low.  By statistics, you have more of a chance of getting killed in a motor vehicle accident: while driving to the hospital than any of the above occurring .  Nevertheless, you should be aware that they are possibilities.  In general, it is similar to taking a shower.  Everybody knows that you can slip, hit your head and get killed.  Does that mean that  you should not shower again?  Nevertheless always keep in mind that statistics do not mean anything if you happen to be on the wrong side of them.  Even if a procedure has a 1 (one) in a 1,000,000 (million) chance of going wrong, it you happen to be that one..Also, keep in mind that by statistics, you have more of a chance of having something go wrong when taking medications.  Who should not have this procedure? If you are on a blood thinning medication (e.g. Coumadin, Plavix, see list of "Blood Thinners"), or if you have an active infection going on, you should not have the procedure.  If you are taking any blood thinners, please inform your physician.  How should I prepare for this procedure?  Do not eat or drink anything at least six hours prior to the procedure.  Bring a driver with you .  It cannot be a taxi.  Come accompanied by an adult that can drive you back, and that is strong enough to help you if your legs get weak or numb from the local anesthetic.  Take all of your medicines the morning of the procedure with just enough water to swallow them.  If you have diabetes, make sure that you are scheduled to have your procedure done first thing in the morning, whenever possible.  If you have diabetes, take only half of your insulin dose and notify our nurse that you have done so as soon as you arrive at the clinic.  If you are diabetic, but only take blood sugar pills (oral hypoglycemic), then do not take them on the morning of your procedure.  You may take them after you have had the procedure.  Do not take aspirin or any aspirin-containing medications, at least eleven (11) days prior to the procedure.  They may prolong bleeding.  Wear loose fitting clothing that may be easy to take off and that you would not mind if it got stained with Betadine or blood.  Do not wear any jewelry or perfume  Remove any nail coloring.  It will interfere with some of our monitoring equipment.  NOTE:  Remember that this is not meant to be interpreted as a complete list of all possible complications.  Unforeseen problems may occur.  BLOOD THINNERS The following drugs contain aspirin or other products, which can cause increased bleeding during surgery and should not be taken for 2 weeks prior to and 1 week after surgery.  If you should need take something for relief of minor pain, you may take acetaminophen which is found in Tylenol,m Datril, Anacin-3 and Panadol. It is not blood thinner. The products listed below are.  Do not take any of the products listed below in addition to any listed on your instruction sheet.  A.P.C or A.P.C with Codeine Codeine Phosphate Capsules #3 Ibuprofen Ridaura  ABC compound Congesprin Imuran rimadil  Advil Cope Indocin Robaxisal  Alka-Seltzer Effervescent Pain Reliever and Antacid Coricidin or Coricidin-D  Indomethacin Rufen  Alka-Seltzer plus Cold Medicine Cosprin Ketoprofen S-A-C Tablets  Anacin Analgesic Tablets or Capsules Coumadin Korlgesic Salflex  Anacin Extra Strength Analgesic tablets or capsules CP-2 Tablets Lanoril Salicylate  Anaprox Cuprimine Capsules Levenox Salocol  Anexsia-D Dalteparin Magan Salsalate  Anodynos Darvon compound Magnesium Salicylate Sine-off  Ansaid Dasin Capsules Magsal Sodium Salicylate  Anturane Depen Capsules Marnal Soma  APF Arthritis pain formula Dewitt's Pills Measurin Stanback  Argesic Dia-Gesic Meclofenamic Sulfinpyrazone  Arthritis Bayer Timed Release Aspirin Diclofenac Meclomen Sulindac  Arthritis pain formula Anacin Dicumarol Medipren Supac  Analgesic (Safety coated) Arthralgen Diffunasal Mefanamic Suprofen  Arthritis Strength Bufferin Dihydrocodeine Mepro Compound Suprol  Arthropan liquid Dopirydamole Methcarbomol with Aspirin Synalgos  ASA tablets/Enseals Disalcid Micrainin Tagament  Ascriptin Doan's Midol Talwin  Ascriptin A/D Dolene Mobidin Tanderil  Ascriptin Extra Strength Dolobid Moblgesic Ticlid   Ascriptin with Codeine Doloprin or Doloprin with Codeine Momentum Tolectin  Asperbuf Duoprin Mono-gesic Trendar  Aspergum Duradyne Motrin or Motrin IB Triminicin  Aspirin plain, buffered or enteric coated Durasal Myochrisine Trigesic  Aspirin Suppositories Easprin Nalfon Trillsate  Aspirin with Codeine Ecotrin Regular or Extra Strength Naprosyn Uracel  Atromid-S Efficin Naproxen Ursinus  Auranofin Capsules Elmiron Neocylate Vanquish  Axotal Emagrin Norgesic Verin  Azathioprine Empirin or Empirin with Codeine Normiflo Vitamin E  Azolid Emprazil Nuprin Voltaren  Bayer Aspirin plain, buffered or children's or timed BC Tablets or powders Encaprin Orgaran Warfarin Sodium  Buff-a-Comp Enoxaparin Orudis Zorpin  Buff-a-Comp with Codeine Equegesic Os-Cal-Gesic   Buffaprin Excedrin plain, buffered or Extra Strength Oxalid   Bufferin Arthritis Strength Feldene Oxphenbutazone   Bufferin plain or Extra Strength Feldene Capsules Oxycodone with Aspirin   Bufferin with Codeine Fenoprofen Fenoprofen Pabalate or Pabalate-SF   Buffets II Flogesic Panagesic   Buffinol plain or Extra Strength Florinal or Florinal with Codeine Panwarfarin   Buf-Tabs Flurbiprofen Penicillamine   Butalbital Compound Four-way cold tablets Penicillin   Butazolidin Fragmin Pepto-Bismol   Carbenicillin Geminisyn Percodan   Carna Arthritis Reliever Geopen Persantine   Carprofen Gold's salt Persistin   Chloramphenicol Goody's Phenylbutazone   Chloromycetin Haltrain Piroxlcam   Clmetidine heparin Plaquenil   Cllnoril Hyco-pap Ponstel   Clofibrate Hydroxy chloroquine Propoxyphen         Before stopping any of these medications, be sure to consult the physician who ordered them.  Some, such as Coumadin (Warfarin) are ordered to prevent or treat serious conditions such as "deep thrombosis", "pumonary embolisms", and other heart problems.  The amount of time that you may need off of the medication may also vary with the medication  and the reason for which you were taking it.  If you are taking any of these medications, please make sure you notify your pain physician before you undergo any procedures.

## 2016-03-15 NOTE — Progress Notes (Signed)
Subjective:    Patient ID: Crystal Roberson, female    DOB: 05-19-1966, 50 y.o.   MRN: [id number]  HPI  PROCEDURE PERFORMED: Lumbosacral selective nerve root block   NOTE: The patient is a 50 y.o. female who returns to [facility] for further evaluation and treatment of pain involving the lumbar and lower extremity region. Studies consisting of MRI has revealed the patient to be with evidence of degenerative changes lumbar spine L3 nerve root deviation which may reflect compression of the nerve roots on the left side of the neural foramen and asymmetric bulging L5-S1 to the right of the midline, appears to occur below the level where the nerve seems to pass through the neural foramen and left paracentral disc bulge at L3 that appears to be significantly contributing to patient's symptomatology. There is concern regarding intraspinal abnormalities contributing to the patient's symptomatology with concern regarding significant forward of pain due to lumbar radiculopathy. The risks, benefits, and expectations of the procedure have been explained to the patient who was understanding and in agreement with suggested treatment plan. We will proceed with interventional treatment as discussed and as explained to the patient. The patient is understanding and in agreement with suggested treatment plan.   DESCRIPTION OF PROCEDURE: Lumbosacral selective nerve root block with IV Versed, IV fentanyl conscious sedation, EKG, blood pressure, pulse, capnography, and pulse oximetry monitoring. The procedure was performed with the patient in the prone position under fluoroscopic guidance. With the patient in the prone position, Betadine prep of proposed entry site was performed. Local anesthetic skin wheal of proposed needle entry site was prepared with 1.5% plain lidocaine with AP view of the lumbosacral spine.   PROCEDURE #1: Needle placement at the left L 2 vertebral body: A 22 -gauge needle was inserted at the  inferior border of the transverse process of the vertebral body with needle placed medial to the midline of the transverse process on AP view of the lumbosacral spine.   NEEDLE PLACEMENT AT  L3, L4, and L5  VERTEBRAL BODY LEVELS  Needle  placement was accomplished at L3, L4, and L5  vertebral body levels on the left side exactly as was accomplished at the L2  vertebral body level  and utilizing the same technique and under fluoroscopic guidance.   Needle placement was then verified on lateral view at all levels with needle tip documented to be in the posterior superior quadrant of the intervertebral foramen of  L 2, L3, L4, and L5. Following negative aspiration for heme and CSF at each level, each level was injected with 3 mL of 0.25% bupivacaine with Kenalog.    LUMBOSACRAL SELECTIVE NERVE ROOT BLOCKS THE THE  RIGHT SIDE   The procedure was performed on the right side exactly as was performed on the left side and at the same levels  Under fluoroscopic guidance and utilizing the same technique.    The patient tolerated the procedure well. A total of 10 mg of Kenalog was utilized for the procedure.   PLAN:  1. Medications: Will continue presently prescribed medications. Norflex Mobic Zanaflex and Ultram and hydrocodone acetaminophen  2. The patient is to undergo follow-up evaluation with PCP for evaluation of blood pressure and general medical condition status post procedure performed on today's visit. 3. Surgical follow-up evaluation.Has been addressed. Patient without plans for surgical intervention at this time  4. Neurological evaluation.May consider PNCV EMG studies and other studiesy consider radiofrequency procedures, implantation type procedures and other treatment pending response  to treatment and follow-up evaluation. 5. The patient has been advise do adhere to proper body mechanics and avoid activities which may aggravate condition. 6. The patient has been advised to call the [facility]  Management Center prior to scheduled return appointment should there be significant change in the patient's condition or should the patient have other concerns regarding condition prior to scheduled return appointment.   Review of Systems     Objective:   Physical Exam        Assessment & Plan:

## 2016-04-04 ENCOUNTER — Encounter: Payer: Self-pay | Admitting: Pain Medicine

## 2016-04-04 ENCOUNTER — Ambulatory Visit: Payer: Self-pay | Attending: Pain Medicine | Admitting: Pain Medicine

## 2016-04-04 VITALS — BP 158/84 | HR 71 | Temp 98.1°F | Resp 16 | Ht 65.0 in | Wt 137.0 lb

## 2016-04-04 DIAGNOSIS — M5127 Other intervertebral disc displacement, lumbosacral region: Secondary | ICD-10-CM | POA: Insufficient documentation

## 2016-04-04 DIAGNOSIS — M51369 Other intervertebral disc degeneration, lumbar region without mention of lumbar back pain or lower extremity pain: Secondary | ICD-10-CM

## 2016-04-04 DIAGNOSIS — M5136 Other intervertebral disc degeneration, lumbar region: Secondary | ICD-10-CM

## 2016-04-04 DIAGNOSIS — M47896 Other spondylosis, lumbar region: Secondary | ICD-10-CM | POA: Insufficient documentation

## 2016-04-04 DIAGNOSIS — M47816 Spondylosis without myelopathy or radiculopathy, lumbar region: Secondary | ICD-10-CM

## 2016-04-04 DIAGNOSIS — M533 Sacrococcygeal disorders, not elsewhere classified: Secondary | ICD-10-CM

## 2016-04-04 DIAGNOSIS — M51379 Other intervertebral disc degeneration, lumbosacral region without mention of lumbar back pain or lower extremity pain: Secondary | ICD-10-CM

## 2016-04-04 DIAGNOSIS — M5126 Other intervertebral disc displacement, lumbar region: Secondary | ICD-10-CM | POA: Insufficient documentation

## 2016-04-04 DIAGNOSIS — G588 Other specified mononeuropathies: Secondary | ICD-10-CM

## 2016-04-04 DIAGNOSIS — M5481 Occipital neuralgia: Secondary | ICD-10-CM

## 2016-04-04 DIAGNOSIS — M5137 Other intervertebral disc degeneration, lumbosacral region: Secondary | ICD-10-CM

## 2016-04-04 DIAGNOSIS — M5416 Radiculopathy, lumbar region: Secondary | ICD-10-CM

## 2016-04-04 MED ORDER — HYDROCODONE-ACETAMINOPHEN 5-325 MG PO TABS: ORAL_TABLET | ORAL | Status: DC

## 2016-04-04 MED ORDER — TIZANIDINE HCL 2 MG PO TABS: ORAL_TABLET | ORAL | Status: DC

## 2016-04-04 MED ORDER — TRAMADOL HCL 50 MG PO TABS: ORAL_TABLET | ORAL | Status: DC

## 2016-04-04 NOTE — Patient Instructions (Addendum)
PLAN   Continue present medication Norflex Zanaflex Mobic hydrocodone and Ultram  Block of nerves to the sacroiliac joint to be performed at time of return appointment  F/U PCP Dr. Adriana Simas for evaliation of  BP and general medical  condition.   F/U surgical evaluation. May consider pending follow-up evaluations  F/U neurological evaluation. May consider PNCV/EMG studies and other studies pending follow-up evaluations  May consider radiofrequency rhizolysis or intraspinal procedures pending response to present treatment and F/U evaluation   Patient to call [facility] should patient have concerns prior to scheduled return appointment.. GENERAL RISKS AND COMPLICATIONS  What are the risk, side effects and possible complications? Generally speaking, most procedures are safe.  However, with any procedure there are risks, side effects, and the possibility of complications.  The risks and complications are dependent upon the sites that are lesioned, or the type of nerve block to be performed.  The closer the procedure is to the spine, the more serious the risks are.  Great care is taken when placing the radio frequency needles, block needles or lesioning probes, but sometimes complications can occur. 1. Infection: Any time there is an injection through the skin, there is a risk of infection.  This is why sterile conditions are used for these blocks.  There are four possible types of infection. 1. Localized skin infection. 2. Central Nervous System Infection-This can be in the form of Meningitis, which can be deadly. 3. Epidural Infections-This can be in the form of an epidural abscess, which can cause pressure inside of the spine, causing compression of the spinal cord with subsequent paralysis. This would require an emergency surgery to decompress, and there are no guarantees that the patient would recover from the paralysis. 4. Discitis-This is an infection of the intervertebral discs.  It  occurs in about 1% of discography procedures.  It is difficult to treat and it may lead to surgery.        2. Pain: the needles have to go through skin and soft tissues, will cause soreness.       3. Damage to internal structures:  The nerves to be lesioned may be near blood vessels or    other nerves which can be potentially damaged.       4. Bleeding: Bleeding is more common if the patient is taking blood thinners such as  aspirin, Coumadin, Ticiid, Plavix, etc., or if he/she have some genetic predisposition  such as hemophilia. Bleeding into the spinal canal can cause compression of the spinal  cord with subsequent paralysis.  This would require an emergency surgery to  decompress and there are no guarantees that the patient would recover from the  paralysis.       5. Pneumothorax:  Puncturing of a lung is a possibility, every time a needle is introduced in  the area of the chest or upper back.  Pneumothorax refers to free air around the  collapsed lung(s), inside of the thoracic cavity (chest cavity).  Another two possible  complications related to a similar event would include: Hemothorax and Chylothorax.   These are variations of the Pneumothorax, where instead of air around the collapsed  lung(s), you may have blood or chyle, respectively.       6. Spinal headaches: They may occur with any procedures in the area of the spine.       7. Persistent CSF (Cerebro-Spinal Fluid) leakage: This is a rare problem, but may occur  with prolonged intrathecal or epidural  catheters either due to the formation of a fistulous  track or a dural tear.       8. Nerve damage: By working so close to the spinal cord, there is always a possibility of  nerve damage, which could be as serious as a permanent spinal cord injury with  paralysis.       9. Death:  Although rare, severe deadly allergic reactions known as "Anaphylactic  reaction" can occur to any of the medications used.      10. Worsening of the symptoms:  We can  always make thing worse.  What are the chances of something like this happening? Chances of any of this occuring are extremely low.  By statistics, you have more of a chance of getting killed in a motor vehicle accident: while driving to the hospital than any of the above occurring .  Nevertheless, you should be aware that they are possibilities.  In general, it is similar to taking a shower.  Everybody knows that you can slip, hit your head and get killed.  Does that mean that you should not shower again?  Nevertheless always keep in mind that statistics do not mean anything if you happen to be on the wrong side of them.  Even if a procedure has a 1 (one) in a 1,000,000 (million) chance of going wrong, it you happen to be that one..Also, keep in mind that by statistics, you have more of a chance of having something go wrong when taking medications.  Who should not have this procedure? If you are on a blood thinning medication (e.g. Coumadin, Plavix, see list of "Blood Thinners"), or if you have an active infection going on, you should not have the procedure.  If you are taking any blood thinners, please inform your physician.  How should I prepare for this procedure?  Do not eat or drink anything at least six hours prior to the procedure.  Bring a driver with you .  It cannot be a taxi.  Come accompanied by an adult that can drive you back, and that is strong enough to help you if your legs get weak or numb from the local anesthetic.  Take all of your medicines the morning of the procedure with just enough water to swallow them.  If you have diabetes, make sure that you are scheduled to have your procedure done first thing in the morning, whenever possible.  If you have diabetes, take only half of your insulin dose and notify our nurse that you have done so as soon as you arrive at the clinic.  If you are diabetic, but only take blood sugar pills (oral hypoglycemic), then do not take them on  the morning of your procedure.  You may take them after you have had the procedure.  Do not take aspirin or any aspirin-containing medications, at least eleven (11) days prior to the procedure.  They may prolong bleeding.  Wear loose fitting clothing that may be easy to take off and that you would not mind if it got stained with Betadine or blood.  Do not wear any jewelry or perfume  Remove any nail coloring.  It will interfere with some of our monitoring equipment.  NOTE: Remember that this is not meant to be interpreted as a complete list of all possible complications.  Unforeseen problems may occur.  BLOOD THINNERS The following drugs contain aspirin or other products, which can cause increased bleeding during surgery and should not be taken  for 2 weeks prior to and 1 week after surgery.  If you should need take something for relief of minor pain, you may take acetaminophen which is found in Tylenol,m Datril, Anacin-3 and Panadol. It is not blood thinner. The products listed below are.  Do not take any of the products listed below in addition to any listed on your instruction sheet.  A.P.C or A.P.C with Codeine Codeine Phosphate Capsules #3 Ibuprofen Ridaura  ABC compound Congesprin Imuran rimadil  Advil Cope Indocin Robaxisal  Alka-Seltzer Effervescent Pain Reliever and Antacid Coricidin or Coricidin-D  Indomethacin Rufen  Alka-Seltzer plus Cold Medicine Cosprin Ketoprofen S-A-C Tablets  Anacin Analgesic Tablets or Capsules Coumadin Korlgesic Salflex  Anacin Extra Strength Analgesic tablets or capsules CP-2 Tablets Lanoril Salicylate  Anaprox Cuprimine Capsules Levenox Salocol  Anexsia-D Dalteparin Magan Salsalate  Anodynos Darvon compound Magnesium Salicylate Sine-off  Ansaid Dasin Capsules Magsal Sodium Salicylate  Anturane Depen Capsules Marnal Soma  APF Arthritis pain formula Dewitt's Pills Measurin Stanback  Argesic Dia-Gesic Meclofenamic Sulfinpyrazone  Arthritis Bayer Timed  Release Aspirin Diclofenac Meclomen Sulindac  Arthritis pain formula Anacin Dicumarol Medipren Supac  Analgesic (Safety coated) Arthralgen Diffunasal Mefanamic Suprofen  Arthritis Strength Bufferin Dihydrocodeine Mepro Compound Suprol  Arthropan liquid Dopirydamole Methcarbomol with Aspirin Synalgos  ASA tablets/Enseals Disalcid Micrainin Tagament  Ascriptin Doan's Midol Talwin  Ascriptin A/D Dolene Mobidin Tanderil  Ascriptin Extra Strength Dolobid Moblgesic Ticlid  Ascriptin with Codeine Doloprin or Doloprin with Codeine Momentum Tolectin  Asperbuf Duoprin Mono-gesic Trendar  Aspergum Duradyne Motrin or Motrin IB Triminicin  Aspirin plain, buffered or enteric coated Durasal Myochrisine Trigesic  Aspirin Suppositories Easprin Nalfon Trillsate  Aspirin with Codeine Ecotrin Regular or Extra Strength Naprosyn Uracel  Atromid-S Efficin Naproxen Ursinus  Auranofin Capsules Elmiron Neocylate Vanquish  Axotal Emagrin Norgesic Verin  Azathioprine Empirin or Empirin with Codeine Normiflo Vitamin E  Azolid Emprazil Nuprin Voltaren  Bayer Aspirin plain, buffered or children's or timed BC Tablets or powders Encaprin Orgaran Warfarin Sodium  Buff-a-Comp Enoxaparin Orudis Zorpin  Buff-a-Comp with Codeine Equegesic Os-Cal-Gesic   Buffaprin Excedrin plain, buffered or Extra Strength Oxalid   Bufferin Arthritis Strength Feldene Oxphenbutazone   Bufferin plain or Extra Strength Feldene Capsules Oxycodone with Aspirin   Bufferin with Codeine Fenoprofen Fenoprofen Pabalate or Pabalate-SF   Buffets II Flogesic Panagesic   Buffinol plain or Extra Strength Florinal or Florinal with Codeine Panwarfarin   Buf-Tabs Flurbiprofen Penicillamine   Butalbital Compound Four-way cold tablets Penicillin   Butazolidin Fragmin Pepto-Bismol   Carbenicillin Geminisyn Percodan   Carna Arthritis Reliever Geopen Persantine   Carprofen Gold's salt Persistin   Chloramphenicol Goody's Phenylbutazone   Chloromycetin  [place]altrain Piroxlcam   Clmetidine heparin Plaquenil   Cllnoril Hyco-pap Ponstel   Clofibrate Hydroxy chloroquine Propoxyphen         Before stopping any of these medications, be sure to consult the physician who ordered them.  Some, such as Coumadin (Warfarin) are ordered to prevent or treat serious conditions such as "deep thrombosis", "pumonary embolisms", and other heart problems.  The amount of time that you may need off of the medication may also vary with the medication and the reason for which you were taking it.  If you are taking any of these medications, please make sure you notify your pain physician before you undergo any procedures.         Sacroiliac (SI) Joint Injection Patient Information  Description: The sacroiliac joint connects the scrum (very low back and tailbone) to the ilium (a  pelvic bone which also forms half of the hip joint).  Normally this joint experiences very little motion.  When this joint becomes inflamed or unstable low back and or hip and pelvis pain may result.  Injection of this joint with local anesthetics (numbing medicines) and steroids can provide diagnostic information and reduce pain.  This injection is performed with the aid of x-ray guidance into the tailbone area while you are lying on your stomach.   You may experience an electrical sensation down the leg while this is being done.  You may also experience numbness.  We also may ask if we are reproducing your normal pain during the injection.  Conditions which may be treated SI injection:   Low back, buttock, hip or leg pain  Preparation for the Injection:  1. Do not eat any solid food or dairy products within 8 hours of your appointment.  2. You may drink clear liquids up to 3 hours before appointment.  Clear liquids include water, black coffee, juice or soda.  No milk or cream please. 3. You may take your regular medications, including pain medications with a sip of water before your  appointment.  Diabetics should hold regular insulin (if take separately) and take 1/2 normal NPH dose the morning of the procedure.  Carry some sugar containing items with you to your appointment. 4. A driver must accompany you and be prepared to drive you home after your procedure. 5. Bring all of your current medications with you. 6. An IV may be inserted and sedation may be given at the discretion of the physician. 7. A blood pressure cuff, EKG and other monitors will often be applied during the procedure.  Some patients may need to have extra oxygen administered for a short period.  8. You will be asked to provide medical information, including your allergies, prior to the procedure.  We must know immediately if you are taking blood thinners (like Coumadin/Warfarin) or if you are allergic to IV iodine contrast (dye).  We must know if you could possible be pregnant.  Possible side effects:   Bleeding from needle site  Infection (rare, may require surgery)  Nerve injury (rare)  Numbness & tingling (temporary)  A brief convulsion or seizure  Light-headedness (temporary)  Pain at injection site (several days)  Decreased blood pressure (temporary)  Weakness in the leg (temporary)   Call if you experience:   New onset weakness or numbness of an extremity below the injection site that last more than 8 hours.  Hives or difficulty breathing ( go to the emergency room)  Inflammation or drainage at the injection site  Any new symptoms which are concerning to you  Please note:  Although the local anesthetic injected can often make your back/ hip/ buttock/ leg feel good for several hours after the injections, the pain will likely return.  It takes 3-7 days for steroids to work in the sacroiliac area.  You may not notice any pain relief for at least that one week.  If effective, we will often do a series of three injections spaced 3-6 weeks apart to maximally decrease your pain.   After the initial series, we generally will wait some months before a repeat injection of the same type.  If you have any questions, please call [phone] [facility]

## 2016-04-04 NOTE — Progress Notes (Signed)
Safety precautions to be maintained throughout the outpatient stay will include: orient to surroundings, keep bed in low position, maintain call bell within reach at all times, provide assistance with transfer out of bed and ambulation.  

## 2016-04-04 NOTE — Progress Notes (Signed)
   Subjective:    Patient ID: Crystal Roberson, female    DOB: 1966/03/04, 50 y.o.   MRN: [id number]  HPI  The patient is a 50 year old female who returns to pain management for further evaluation and treatment of pain involving headaches as well as pain involving the neck entire back upper and lower extremity regions. The patient notes significant improvement of her pain following previous procedure performed in pain management. The patient states that she thinks that she has overdone it since the procedure made her feel so good. We will continue patient's present medications and we will consider patient for block of nerves to the sacroiliac joint at time return appointment if patient's pain persists following significant improvement after the previous injection in the pain clinic. All agreed to suggested treatment plan. The patient will continue Norflex Mobic Zanaflex Ultram and hydrocodone acetaminophen at this time.  Review of Systems     Objective:   Physical Exam   There was tenderness to palpation of the paraspinal musculature region cervical region cervical facet region palpation which reproduces mild discomfort. There was mild tenderness of the splenius capitis and occipitalis regions. The patient was with unremarkable Spurling's maneuver and was able to perform drop test without significant difficulty No masses of the head and neck were noted. There was tenderness over the region of the thoracic region of mild degree with no crepitus of the thoracic region noted. Palpation over the region of the lumbar region was attends to palpation with lateral bending rotation extension and palpation the lumbar facets reproducing mild discomfort as well. Straight leg raise was tolerates approximately 30 without increase of pain with dorsiflexion noted. Palpation over the PSIS and PII S region did reproduce moderate to moderately severe discomfort. There was moderate to moderately severe increase of pain with  pressure applied to the ileum with patient in lateral decubitus position. EHL strength appeared to be slightly decreased without a sensory deficit or dermatomal distribution detected. DTRs appeared to be trace at the knees. There was minimal tenderness of the greater trochanteric region iliotibial band region. There was negative clonus negative Homans. 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 which may reflect compression of the nerve roots on the left side of the neural foramen and asymmetric bulging L5-S1 to the right of the midline, appears to occur below the level where the nerve seems to pass through the neural foramen and left paracentral disc bulge at L3 that appears to be significantly contributing to patient's symptomatology  Lumbar radiculopathy  Lumbar facet syndrome  Sacroiliac joint dysfunction     PLAN   Continue present medication Norflex Zanaflex Mobic hydrocodone and Ultram  Block of nerves to the sacroiliac joint to be performed at time of return appointment  F/U PCP Dr. Adriana Simasook for evaliation of  BP and general medical  condition.   F/U surgical evaluation. May consider pending follow-up evaluations  F/U neurological evaluation. May consider PNCV/EMG studies and other studies pending follow-up evaluations  May consider radiofrequency rhizolysis or intraspinal procedures pending response to present treatment and F/U evaluation   Patient to call Pain Management Center should patient have concerns prior to scheduled return appointment.

## 2016-04-30 ENCOUNTER — Other Ambulatory Visit: Payer: Self-pay | Admitting: Pain Medicine

## 2016-05-01 ENCOUNTER — Ambulatory Visit: Payer: Self-pay | Attending: Pain Medicine | Admitting: Pain Medicine

## 2016-05-01 ENCOUNTER — Encounter: Payer: Self-pay | Admitting: Pain Medicine

## 2016-05-01 VITALS — BP 165/88 | HR 72 | Temp 97.5°F | Resp 16 | Ht 65.0 in | Wt 142.0 lb

## 2016-05-01 DIAGNOSIS — M5137 Other intervertebral disc degeneration, lumbosacral region: Secondary | ICD-10-CM

## 2016-05-01 DIAGNOSIS — M5481 Occipital neuralgia: Secondary | ICD-10-CM

## 2016-05-01 DIAGNOSIS — G588 Other specified mononeuropathies: Secondary | ICD-10-CM

## 2016-05-01 DIAGNOSIS — M51379 Other intervertebral disc degeneration, lumbosacral region without mention of lumbar back pain or lower extremity pain: Secondary | ICD-10-CM

## 2016-05-01 DIAGNOSIS — M791 Myalgia: Secondary | ICD-10-CM | POA: Insufficient documentation

## 2016-05-01 DIAGNOSIS — M533 Sacrococcygeal disorders, not elsewhere classified: Secondary | ICD-10-CM

## 2016-05-01 DIAGNOSIS — M47816 Spondylosis without myelopathy or radiculopathy, lumbar region: Secondary | ICD-10-CM

## 2016-05-01 DIAGNOSIS — G4700 Insomnia, unspecified: Secondary | ICD-10-CM

## 2016-05-01 DIAGNOSIS — M5416 Radiculopathy, lumbar region: Secondary | ICD-10-CM

## 2016-05-01 DIAGNOSIS — M545 Low back pain: Secondary | ICD-10-CM | POA: Insufficient documentation

## 2016-05-01 DIAGNOSIS — M79606 Pain in leg, unspecified: Secondary | ICD-10-CM | POA: Insufficient documentation

## 2016-05-01 MED ORDER — ORPHENADRINE CITRATE ER 100 MG PO TB12: ORAL_TABLET | ORAL | 2 refills | Status: DC

## 2016-05-01 MED ORDER — LACTATED RINGERS IV SOLN: 1000.0000 mL | INTRAVENOUS | Status: DC

## 2016-05-01 MED ORDER — TRIAMCINOLONE ACETONIDE 40 MG/ML IJ SUSP
40.0000 mg | Freq: Once | INTRAMUSCULAR | Status: DC
  Filled 2016-05-01: qty 1

## 2016-05-01 MED ORDER — ORPHENADRINE CITRATE 30 MG/ML IJ SOLN
60.0000 mg | Freq: Once | INTRAMUSCULAR | Status: AC
  Administered 2016-05-01: 60 mg via INTRAMUSCULAR
  Filled 2016-05-01: qty 2

## 2016-05-01 MED ORDER — MELOXICAM 7.5 MG PO TABS: ORAL_TABLET | ORAL | 0 refills | Status: DC

## 2016-05-01 MED ORDER — MIDAZOLAM HCL 5 MG/5ML IJ SOLN
5.0000 mg | Freq: Once | INTRAMUSCULAR | Status: AC
  Administered 2016-05-01: 5 mg via INTRAVENOUS
  Filled 2016-05-01: qty 5

## 2016-05-01 MED ORDER — FENTANYL CITRATE (PF) 100 MCG/2ML IJ SOLN
100.0000 ug | Freq: Once | INTRAMUSCULAR | Status: AC
  Administered 2016-05-01: 100 ug via INTRAVENOUS
  Filled 2016-05-01: qty 2

## 2016-05-01 MED ORDER — HYDROCODONE-ACETAMINOPHEN 5-325 MG PO TABS: ORAL_TABLET | ORAL | 0 refills | Status: DC

## 2016-05-01 MED ORDER — TRAMADOL HCL 50 MG PO TABS: ORAL_TABLET | ORAL | 0 refills | Status: DC

## 2016-05-01 MED ORDER — CEFUROXIME AXETIL 250 MG PO TABS: 250.0000 mg | ORAL_TABLET | Freq: Two times a day (BID) | ORAL | 0 refills | Status: DC

## 2016-05-01 MED ORDER — BUPIVACAINE HCL (PF) 0.5 % IJ SOLN
INTRAMUSCULAR | Status: AC
  Administered 2016-05-01: 10:00:00
  Filled 2016-05-01: qty 30

## 2016-05-01 MED ORDER — TIZANIDINE HCL 2 MG PO TABS: ORAL_TABLET | ORAL | 0 refills | Status: DC

## 2016-05-01 MED ORDER — CEFAZOLIN IN D5W 1 GM/50ML IV SOLN
1.0000 g | Freq: Once | INTRAVENOUS | Status: AC
  Administered 2016-05-01: 1 g via INTRAVENOUS

## 2016-05-01 MED ORDER — BUPIVACAINE HCL (PF) 0.25 % IJ SOLN
30.0000 mL | Freq: Once | INTRAMUSCULAR | Status: DC
  Filled 2016-05-01: qty 30

## 2016-05-01 NOTE — Progress Notes (Signed)
Safety precautions to be maintained throughout the outpatient stay will include: orient to surroundings, keep bed in low position, maintain call bell within reach at all times, provide assistance with transfer out of bed and ambulation.  

## 2016-05-01 NOTE — Progress Notes (Signed)
PROCEDURE:  Block of nerves to the sacroiliac joint.   NOTE:  The patient is a 50 y.o. female who returns to the [facility] for further evaluation and treatment of pain involving the lower back and lower extremity region with pain in the region of the buttocks as well. 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 The patient is with reproduction of severe pain with palpation over the PSIS and PII S regions. Palpation of these regions reproduced severely disabling pain.  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 We will proceed with what is felt to be a medically necessary procedure, block of nerves to the sacroiliac joint.   DESCRIPTION OF PROCEDURE:  Block of nerves to the sacroiliac joint.   The patient was taken to the fluoroscopy suite. With the patient in the prone position with EKG, blood pressure, pulse, capnography, and pulse oximetry monitoring, IV Versed, IV fentanyl conscious sedation, Betadine prep of proposed entry site was performed.   Block of nerves at the L5 vertebral body level.   With the patient in prone position, under fluoroscopic guidance, a 22 -gauge needle was inserted at the L5 vertebral body level on the left side. With 15 degrees oblique  orientation a 22 -gauge needle was inserted in the region known as Burton's eye or eye of the Scotty dog. Following documentation of needle placement in the area of Burton's eye or eye of the Scotty dog under fluoroscopic guidance, needle placement was then accomplished at the sacral ala level on the left side.   Needle placement at the sacral ala.   With the patient in prone position under fluoroscopic guidance with AP view of the lumbosacral spine, a 22 -gauge needle was inserted in the region known as the sacral ala on the left side. Following documentation of needle placement on the left side under fluoroscopic guidance needle placement was then accomplished at the S1 foramen level.   Needle placement at the S1 foramen level.   With the patient in prone position under fluoroscopic guidance with AP view of the lumbosacral spine and cephalad orientation, a 22 -gauge needle was inserted at the superior and lateral border of the S1 foramen on the left side. Following documentation of needle placement at the S1 foramen level on the left side, needle placement was then accomplished at the S2 foramen level on the left side.   Needle placement at the S2 foramen level.   With the patient in prone position with AP view of the lumbosacral spine with cephalad orientation, a 22 - gauge needle was inserted at the superior and lateral border of the S2 foramen under fluoroscopic guidance on the left side. Following needle placement at the L5 vertebral body level, sacral ala, S1 foramen and S2 foramen on the left side, needle placement was verified on  lateral view under fluoroscopic guidance.  Following needle placement documentation on lateral view, each needle was injected with 1 mL of 0. 50% bupivacaine.   BLOCK OF THE NERVES TO SACROILIAC JOINT ON THE RIGHT SIDE The procedure was performed on the right side at the same levels as was performed on the left side and utilizing the same technique as on the left  side and was performed under fluoroscopic guidance as on the left side    Myoneural block injections of the lumbar paraspinal musculature region Following Betadine prep of proposed entry site a 22-gauge needle was inserted into the gluteal musculature region and following negative aspiration 2 cc of 0.5% bupivacaine and Norflex was injected for myoneural block injection of the lumbar paraspinal musculature region 4   No Kenalog was utilized for the procedure   PLAN:  1. Medications: The patient will continue presently prescribed medications Norflex Zanaflex  Mobic hydrocodone acetaminophen and Ultram.  2. The patient will be considered for modification of treatment regimen pending response to the procedure performed on today's visit.  3. The patient is to follow-up with primary care physician for evaluation of blood pressure and general medical condition this week following the procedure performed on today's visit. The patient was advised to see primary care physician this week for evaluation of elevated blood pressure. We requested patient see her primary care physician Tuesday, 05/02/2016  4. Surgical evaluation as discussed.  5. Neurological evaluation as discussed.  6. The patient may be a candidate for radiofrequency procedures, implantation devices and other treatment pending response to treatment performed on today's visit and follow-up evaluation.  7. The patient has been advised to adhere to proper body mechanics and to avoid activities which may exacerbate the patient's symptoms.   Return appointment to Pain Management Center as scheduled.

## 2016-05-01 NOTE — Patient Instructions (Addendum)
PLAN   Continue present medication Norflex Zanaflex Mobic hydrocodone and Ultram Please get Ceftin antibiotic today and begin taking Ceftin antibiotic today as prescribe  F/U PCP for evaluation of  BP and general medical condition today or by tomorrow 05/02/2016. Blood pressure is elevated  F/U surgical evaluation. May consider pending follow-up evaluations  F/U neurological evaluation. May consider PNCV/EMG studies and other studies pending follow-up evaluations  May consider radiofrequency rhizolysis or intraspinal procedures pending response to present treatment and F/U evaluation   Patient to call [facility] should patient have concerns prior to scheduled return appointment.Pain Management Discharge Instructions  General Discharge Instructions :  If you need to reach your doctor call: Monday-Friday 8:00 am - 4:00 pm at [phone] or toll free [phone].  After clinic hours [phone] to have operator reach doctor.  Bring all of your medication bottles to all your appointments in the pain clinic.  To cancel or reschedule your appointment with Pain Management please remember to call 24 hours in advance to avoid a fee.  Refer to the educational materials which you have been given on: General Risks, I had my Procedure. Discharge Instructions, Post Sedation.  Post Procedure Instructions:  The drugs you were given will stay in your system until tomorrow, so for the next 24 hours you should not drive, make any legal decisions or drink any alcoholic beverages.  You may eat anything you prefer, but it is better to start with liquids then soups and crackers, and gradually work up to solid foods.  Please notify your doctor immediately if you have any unusual bleeding, trouble breathing or pain that is not related to your normal pain.  Depending on the type of procedure that was done, some parts of your body may feel week and/or numb.  This usually clears up by tonight  or the next day.  Walk with the use of an assistive device or accompanied by an adult for the 24 hours.  You may use ice on the affected area for the first 24 hours.  Put ice in a Ziploc bag and cover with a towel and place against area 15 minutes on 15 minutes off.  You may switch to heat after 24 hours.GENERAL RISKS AND COMPLICATIONS  What are the risk, side effects and possible complications? Generally speaking, most procedures are safe.  However, with any procedure there are risks, side effects, and the possibility of complications.  The risks and complications are dependent upon the sites that are lesioned, or the type of nerve block to be performed.  The closer the procedure is to the spine, the more serious the risks are.  Great care is taken when placing the radio frequency needles, block needles or lesioning probes, but sometimes complications can occur. 1. Infection: Any time there is an injection through the skin, there is a risk of infection.  This is why sterile conditions are used for these blocks.  There are four possible types of infection. 1. Localized skin infection. 2. Central Nervous System Infection-This can be in the form of Meningitis, which can be deadly. 3. Epidural Infections-This can be in the form of an epidural abscess, which can cause pressure inside of the spine, causing compression of the spinal cord with subsequent paralysis. This would require an emergency surgery to decompress, and there are no guarantees that the patient would recover from the paralysis. 4. Discitis-This is an infection of the intervertebral discs.  It occurs in about 1% of discography procedures.  It is  difficult to treat and it may lead to surgery.        2. Pain: the needles have to go through skin and soft tissues, will cause soreness.       3. Damage to internal structures:  The nerves to be lesioned may be near blood vessels or    other nerves which can be potentially damaged.        4. Bleeding: Bleeding is more common if the patient is taking blood thinners such as  aspirin, Coumadin, Ticiid, Plavix, etc., or if he/she have some genetic predisposition  such as hemophilia. Bleeding into the spinal canal can cause compression of the spinal  cord with subsequent paralysis.  This would require an emergency surgery to  decompress and there are no guarantees that the patient would recover from the  paralysis.       5. Pneumothorax:  Puncturing of a lung is a possibility, every time a needle is introduced in  the area of the chest or upper back.  Pneumothorax refers to free air around the  collapsed lung(s), inside of the thoracic cavity (chest cavity).  Another two possible  complications related to a similar event would include: Hemothorax and Chylothorax.   These are variations of the Pneumothorax, where instead of air around the collapsed  lung(s), you may have blood or chyle, respectively.       6. Spinal headaches: They may occur with any procedures in the area of the spine.       7. Persistent CSF (Cerebro-Spinal Fluid) leakage: This is a rare problem, but may occur  with prolonged intrathecal or epidural catheters either due to the formation of a fistulous  track or a dural tear.       8. Nerve damage: By working so close to the spinal cord, there is always a possibility of  nerve damage, which could be as serious as a permanent spinal cord injury with  paralysis.       9. Death:  Although rare, severe deadly allergic reactions known as "Anaphylactic  reaction" can occur to any of the medications used.      10. Worsening of the symptoms:  We can always make thing worse.  What are the chances of something like this happening? Chances of any of this occuring are extremely low.  By statistics, you have more of a chance of getting killed in a motor vehicle accident: while driving to the hospital than any of the above occurring .  Nevertheless, you should be aware that they are  possibilities.  In general, it is similar to taking a shower.  Everybody knows that you can slip, hit your head and get killed.  Does that mean that you should not shower again?  Nevertheless always keep in mind that statistics do not mean anything if you happen to be on the wrong side of them.  Even if a procedure has a 1 (one) in a 1,000,000 (million) chance of going wrong, it you happen to be that one..Also, keep in mind that by statistics, you have more of a chance of having something go wrong when taking medications.  Who should not have this procedure? If you are on a blood thinning medication (e.g. Coumadin, Plavix, see list of "Blood Thinners"), or if you have an active infection going on, you should not have the procedure.  If you are taking any blood thinners, please inform your physician.  How should I prepare for this procedure?  Do not  eat or drink anything at least six hours prior to the procedure.  Bring a driver with you .  It cannot be a taxi.  Come accompanied by an adult that can drive you back, and that is strong enough to help you if your legs get weak or numb from the local anesthetic.  Take all of your medicines the morning of the procedure with just enough water to swallow them.  If you have diabetes, make sure that you are scheduled to have your procedure done first thing in the morning, whenever possible.  If you have diabetes, take only half of your insulin dose and notify our nurse that you have done so as soon as you arrive at the clinic.  If you are diabetic, but only take blood sugar pills (oral hypoglycemic), then do not take them on the morning of your procedure.  You may take them after you have had the procedure.  Do not take aspirin or any aspirin-containing medications, at least eleven (11) days prior to the procedure.  They may prolong bleeding.  Wear loose fitting clothing that may be easy to take off and that you would not mind if it got stained with  Betadine or blood.  Do not wear any jewelry or perfume  Remove any nail coloring.  It will interfere with some of our monitoring equipment.  NOTE: Remember that this is not meant to be interpreted as a complete list of all possible complications.  Unforeseen problems may occur.  BLOOD THINNERS The following drugs contain aspirin or other products, which can cause increased bleeding during surgery and should not be taken for 2 weeks prior to and 1 week after surgery.  If you should need take something for relief of minor pain, you may take acetaminophen which is found in Tylenol,m Datril, Anacin-3 and Panadol. It is not blood thinner. The products listed below are.  Do not take any of the products listed below in addition to any listed on your instruction sheet.  A.P.C or A.P.C with Codeine Codeine Phosphate Capsules #3 Ibuprofen Ridaura  ABC compound Congesprin Imuran rimadil  Advil Cope Indocin Robaxisal  Alka-Seltzer Effervescent Pain Reliever and Antacid Coricidin or Coricidin-D  Indomethacin Rufen  Alka-Seltzer plus Cold Medicine Cosprin Ketoprofen S-A-C Tablets  Anacin Analgesic Tablets or Capsules Coumadin Korlgesic Salflex  Anacin Extra Strength Analgesic tablets or capsules CP-2 Tablets Lanoril Salicylate  Anaprox Cuprimine Capsules Levenox Salocol  Anexsia-D Dalteparin Magan Salsalate  Anodynos Darvon compound Magnesium Salicylate Sine-off  Ansaid Dasin Capsules Magsal Sodium Salicylate  Anturane Depen Capsules Marnal Soma  APF Arthritis pain formula Dewitt's Pills Measurin Stanback  Argesic Dia-Gesic Meclofenamic Sulfinpyrazone  Arthritis Bayer Timed Release Aspirin Diclofenac Meclomen Sulindac  Arthritis pain formula Anacin Dicumarol Medipren Supac  Analgesic (Safety coated) Arthralgen Diffunasal Mefanamic Suprofen  Arthritis Strength Bufferin Dihydrocodeine Mepro Compound Suprol  Arthropan liquid Dopirydamole Methcarbomol with Aspirin Synalgos  ASA tablets/Enseals Disalcid  Micrainin Tagament  Ascriptin Doan's Midol Talwin  Ascriptin A/D Dolene Mobidin Tanderil  Ascriptin Extra Strength Dolobid Moblgesic Ticlid  Ascriptin with Codeine Doloprin or Doloprin with Codeine Momentum Tolectin  Asperbuf Duoprin Mono-gesic Trendar  Aspergum Duradyne Motrin or Motrin IB Triminicin  Aspirin plain, buffered or enteric coated Durasal Myochrisine Trigesic  Aspirin Suppositories Easprin Nalfon Trillsate  Aspirin with Codeine Ecotrin Regular or Extra Strength Naprosyn Uracel  Atromid-S Efficin Naproxen Ursinus  Auranofin Capsules Elmiron Neocylate Vanquish  Axotal Emagrin Norgesic Verin  Azathioprine Empirin or Empirin with Codeine Normiflo Vitamin E  Azolid  Emprazil Nuprin Voltaren  Bayer Aspirin plain, buffered or children's or timed BC Tablets or powders Encaprin Orgaran Warfarin Sodium  Buff-a-Comp Enoxaparin Orudis Zorpin  Buff-a-Comp with Codeine Equegesic Os-Cal-Gesic   Buffaprin Excedrin plain, buffered or Extra Strength Oxalid   Bufferin Arthritis Strength Feldene Oxphenbutazone   Bufferin plain or Extra Strength Feldene Capsules Oxycodone with Aspirin   Bufferin with Codeine Fenoprofen Fenoprofen Pabalate or Pabalate-SF   Buffets II Flogesic Panagesic   Buffinol plain or Extra Strength Florinal or Florinal with Codeine Panwarfarin   Buf-Tabs Flurbiprofen Penicillamine   Butalbital Compound Four-way cold tablets Penicillin   Butazolidin Fragmin Pepto-Bismol   Carbenicillin Geminisyn Percodan   Carna Arthritis Reliever Geopen Persantine   Carprofen Gold's salt Persistin   Chloramphenicol Goody's Phenylbutazone   Chloromycetin Haltrain Piroxlcam   Clmetidine heparin Plaquenil   Cllnoril Hyco-pap Ponstel   Clofibrate Hydroxy chloroquine Propoxyphen         Before stopping any of these medications, be sure to consult the physician who ordered them.  Some, such as Coumadin (Warfarin) are ordered to prevent or treat serious conditions such as "deep thrombosis",  "pumonary embolisms", and other heart problems.  The amount of time that you may need off of the medication may also vary with the medication and the reason for which you were taking it.  If you are taking any of these medications, please make sure you notify your pain physician before you undergo any procedures.   Hydrocodone prescription given x2.

## 2016-05-01 NOTE — Progress Notes (Signed)
Dr. Metta Clinesrisp aware of elevated BP.

## 2016-05-02 ENCOUNTER — Telehealth: Payer: Self-pay | Admitting: *Deleted

## 2016-05-02 NOTE — Telephone Encounter (Signed)
Message left

## 2016-05-22 ENCOUNTER — Other Ambulatory Visit: Payer: Self-pay | Admitting: Pain Medicine

## 2016-05-22 ENCOUNTER — Telehealth: Payer: Self-pay

## 2016-05-22 DIAGNOSIS — M5416 Radiculopathy, lumbar region: Secondary | ICD-10-CM

## 2016-05-22 DIAGNOSIS — G588 Other specified mononeuropathies: Secondary | ICD-10-CM

## 2016-05-22 DIAGNOSIS — M5137 Other intervertebral disc degeneration, lumbosacral region: Secondary | ICD-10-CM

## 2016-05-22 DIAGNOSIS — M533 Sacrococcygeal disorders, not elsewhere classified: Secondary | ICD-10-CM

## 2016-05-22 DIAGNOSIS — M47816 Spondylosis without myelopathy or radiculopathy, lumbar region: Secondary | ICD-10-CM

## 2016-05-22 DIAGNOSIS — M5481 Occipital neuralgia: Secondary | ICD-10-CM

## 2016-05-22 NOTE — Telephone Encounter (Signed)
Called pt and she states that she is still having a lot of pain from last procedure and wants to have something done. I talked with Dr crisp about her back pain going down right leg to foot and he states he will do a SNB on Wednesday if available. Pt states that she could no tdo med refill and procedure two days in a row r/t finaces- will schedule for both on Wednesday.

## 2016-05-22 NOTE — Telephone Encounter (Signed)
Nurses, Please call patient and obtain details of her pain and please discuss with me so that we can decide plan of treatment Thank you

## 2016-05-22 NOTE — Telephone Encounter (Signed)
Patient called, left vm. Said her last procedure didn't work and wants to so something else.

## 2016-05-23 ENCOUNTER — Ambulatory Visit: Payer: Self-pay | Admitting: Pain Medicine

## 2016-05-24 ENCOUNTER — Encounter: Payer: Self-pay | Admitting: Pain Medicine

## 2016-05-24 ENCOUNTER — Ambulatory Visit: Payer: Self-pay | Attending: Pain Medicine | Admitting: Pain Medicine

## 2016-05-24 VITALS — BP 194/87 | HR 68 | Temp 96.2°F | Resp 12 | Ht 65.0 in | Wt 139.5 lb

## 2016-05-24 DIAGNOSIS — F329 Major depressive disorder, single episode, unspecified: Secondary | ICD-10-CM

## 2016-05-24 DIAGNOSIS — G588 Other specified mononeuropathies: Secondary | ICD-10-CM

## 2016-05-24 DIAGNOSIS — M5137 Other intervertebral disc degeneration, lumbosacral region: Secondary | ICD-10-CM

## 2016-05-24 DIAGNOSIS — M5416 Radiculopathy, lumbar region: Secondary | ICD-10-CM

## 2016-05-24 DIAGNOSIS — G4700 Insomnia, unspecified: Secondary | ICD-10-CM

## 2016-05-24 DIAGNOSIS — M5481 Occipital neuralgia: Secondary | ICD-10-CM

## 2016-05-24 DIAGNOSIS — M47816 Spondylosis without myelopathy or radiculopathy, lumbar region: Secondary | ICD-10-CM | POA: Insufficient documentation

## 2016-05-24 DIAGNOSIS — M533 Sacrococcygeal disorders, not elsewhere classified: Secondary | ICD-10-CM

## 2016-05-24 DIAGNOSIS — F32A Depression, unspecified: Secondary | ICD-10-CM

## 2016-05-24 MED ORDER — MIDAZOLAM HCL 5 MG/5ML IJ SOLN
5.0000 mg | Freq: Once | INTRAMUSCULAR | Status: AC
  Administered 2016-05-24: 5 mg via INTRAVENOUS
  Filled 2016-05-24: qty 5

## 2016-05-24 MED ORDER — BUPIVACAINE HCL (PF) 0.25 % IJ SOLN
30.0000 mL | Freq: Once | INTRAMUSCULAR | Status: AC
  Administered 2016-05-24: 30 mL
  Filled 2016-05-24: qty 30

## 2016-05-24 MED ORDER — CEFAZOLIN IN D5W 1 GM/50ML IV SOLN
1.0000 g | Freq: Once | INTRAVENOUS | Status: AC
Start: 2016-05-24 — End: 2016-05-24
  Administered 2016-05-24: 1 g via INTRAVENOUS

## 2016-05-24 MED ORDER — CEFUROXIME AXETIL 250 MG PO TABS: 250.0000 mg | ORAL_TABLET | Freq: Two times a day (BID) | ORAL | 0 refills | Status: DC

## 2016-05-24 MED ORDER — FENTANYL CITRATE (PF) 100 MCG/2ML IJ SOLN
100.0000 ug | Freq: Once | INTRAMUSCULAR | Status: AC
  Administered 2016-05-24: 100 ug via INTRAVENOUS
  Filled 2016-05-24: qty 2

## 2016-05-24 MED ORDER — CEFAZOLIN SODIUM 1 G IJ SOLR
INTRAMUSCULAR | Status: AC
  Administered 2016-05-24: 10:00:00
  Filled 2016-05-24: qty 10

## 2016-05-24 MED ORDER — LACTATED RINGERS IV SOLN
1000.0000 mL | INTRAVENOUS | Status: DC
  Administered 2016-05-24: 1000 mL via INTRAVENOUS

## 2016-05-24 MED ORDER — ORPHENADRINE CITRATE 30 MG/ML IJ SOLN
60.0000 mg | Freq: Once | INTRAMUSCULAR | Status: AC
Start: 2016-05-24 — End: 2016-05-24
  Administered 2016-05-24: 60 mg via INTRAMUSCULAR
  Filled 2016-05-24: qty 2

## 2016-05-24 MED ORDER — TRIAMCINOLONE ACETONIDE 40 MG/ML IJ SUSP
40.0000 mg | Freq: Once | INTRAMUSCULAR | Status: AC
  Administered 2016-05-24: 40 mg
  Filled 2016-05-24: qty 1

## 2016-05-24 MED ORDER — LIDOCAINE HCL (PF) 1 % IJ SOLN
10.0000 mL | Freq: Once | INTRAMUSCULAR | Status: AC
  Administered 2016-05-24: 10 mL via SUBCUTANEOUS
  Filled 2016-05-24: qty 10

## 2016-05-24 MED ORDER — TRAMADOL HCL 50 MG PO TABS: ORAL_TABLET | ORAL | 0 refills | Status: DC

## 2016-05-24 MED ORDER — HYDROCODONE-ACETAMINOPHEN 5-325 MG PO TABS: ORAL_TABLET | ORAL | 0 refills | Status: DC

## 2016-05-24 NOTE — Progress Notes (Signed)
PROCEDURE PERFORMED: Lumbosacral selective nerve root block   NOTE: The patient is a 50 y.o. female who returns to [facility] for further evaluation and treatment of pain involving the lumbar and lower extremity region. 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 There is concern regarding component of patient's pain began due to lumbar radiculopathy The risks, benefits, and expectations of the procedure have been explained to the patient who was understanding and in agreement with suggested treatment plan. We will proceed with interventional treatment as discussed and as explained to the patient. The patient is understanding and in agreement with suggested treatment plan.   DESCRIPTION OF PROCEDURE: Lumbosacral selective nerve root block with IV Versed, IV fentanyl conscious sedation, EKG, blood pressure, pulse, capnography, and pulse oximetry monitoring. The procedure was performed with the patient in the prone position under fluoroscopic guidance. With the patient in the prone position, Betadine prep of proposed entry site was performed. Local anesthetic skin wheal of proposed needle entry site was prepared with 1.5% plain lidocaine with AP view of the lumbosacral spine.   PROCEDURE #1: Needle placement at the left L 2 vertebral body: A 22 -gauge needle was inserted at the inferior border of the transverse process of the vertebral body with needle placed medial to the midline of the transverse process on AP view of the lumbosacral spine.   NEEDLE  PLACEMENT AT  L3, L4, and L5  VERTEBRAL BODY LEVELS  Needle  placement was accomplished at L3, L4, and L5  vertebral body levels on the left side exactly as was accomplished at the L2  vertebral body level  and utilizing the same technique and under fluoroscopic guidance.   Needle placement was then verified on lateral view at all levels with needle tip documented to be in the posterior superior quadrant of the intervertebral foramen of  L 2 L3, L4, and L5. Following negative aspiration for heme and CSF at each level, each level was injected with 3 mL of 0.25% bupivacaine with Kenalog.     LUMBOSACRAL SELECTIVE NERVE ROOT BLOCKS THE THE  RIGHT SIDE The procedure was performed on the right side exactly as was performed on the left side and at the same levels  Under fluoroscopic guidance and utilizing the same technique.    The patient tolerated the procedure well. A total of 10 mg of Kenalog was utilized for the procedure.   PLAN:  1. Medications: Will continue presently prescribed medications Zanaflex Mobic Norflex and Ultram and hydrocodone acetaminophen. 2. The patient is to undergo follow-up evaluation with PCP Tommy RainwaterJ Cook for evaluation of blood pressure and general medical condition status post procedure performed on today's visit. 3. Surgical follow-up evaluation.Marland Kitchen. Has been addressed 4. Neurological evaluation.. May consider PNCV EMG studies and other studies 5. May consider radiofrequency procedures, implantation type procedures and other treatment pending response to treatment and follow-up evaluation. 6. The patient has been advised do adhere to proper body mechanics and avoid activities which may aggravate condition. 7. The patient has been advised to call the [facility] Management Center  prior to scheduled return appointment should there be significant change in the patient's condition or should the patient have other concerns regarding condition prior to scheduled return appointment.

## 2016-05-24 NOTE — Patient Instructions (Addendum)
PLAN   Continue present medication Norflex Zanaflex Mobic hydrocodone and Ultram Please get Ceftin antibiotic today and begin taking Ceftin antibiotic today as prescribe  F/U PCP for evaluation of  BP and general medical condition today or by tomorrow 05/02/2016. Blood pressure is elevated  F/U surgical evaluation. May consider pending follow-up evaluations  F/U neurological evaluation. May consider PNCV/EMG studies and other studies pending follow-up evaluations  May consider radiofrequency rhizolysis or intraspinal procedures pending response to present treatment and F/U evaluation   Patient to call [facility] should patient have concerns prior to scheduled return appointment.GENERAL RISKS AND COMPLICATIONS  What are the risk, side effects and possible complications? Generally speaking, most procedures are safe.  However, with any procedure there are risks, side effects, and the possibility of complications.  The risks and complications are dependent upon the sites that are lesioned, or the type of nerve block to be performed.  The closer the procedure is to the spine, the more serious the risks are.  Great care is taken when placing the radio frequency needles, block needles or lesioning probes, but sometimes complications can occur. 1. Infection: Any time there is an injection through the skin, there is a risk of infection.  This is why sterile conditions are used for these blocks.  There are four possible types of infection. 1. Localized skin infection. 2. Central Nervous System Infection-This can be in the form of Meningitis, which can be deadly. 3. Epidural Infections-This can be in the form of an epidural abscess, which can cause pressure inside of the spine, causing compression of the spinal cord with subsequent paralysis. This would require an emergency surgery to decompress, and there are no guarantees that the patient would recover from the paralysis. 4. Discitis-This is  an infection of the intervertebral discs.  It occurs in about 1% of discography procedures.  It is difficult to treat and it may lead to surgery.        2. Pain: the needles have to go through skin and soft tissues, will cause soreness.       3. Damage to internal structures:  The nerves to be lesioned may be near blood vessels or    other nerves which can be potentially damaged.       4. Bleeding: Bleeding is more common if the patient is taking blood thinners such as  aspirin, Coumadin, Ticiid, Plavix, etc., or if he/she have some genetic predisposition  such as hemophilia. Bleeding into the spinal canal can cause compression of the spinal  cord with subsequent paralysis.  This would require an emergency surgery to  decompress and there are no guarantees that the patient would recover from the  paralysis.       5. Pneumothorax:  Puncturing of a lung is a possibility, every time a needle is introduced in  the area of the chest or upper back.  Pneumothorax refers to free air around the  collapsed lung(s), inside of the thoracic cavity (chest cavity).  Another two possible  complications related to a similar event would include: Hemothorax and Chylothorax.   These are variations of the Pneumothorax, where instead of air around the collapsed  lung(s), you may have blood or chyle, respectively.       6. Spinal headaches: They may occur with any procedures in the area of the spine.       7. Persistent CSF (Cerebro-Spinal Fluid) leakage: This is a rare problem, but may occur  with prolonged intrathecal or  epidural catheters either due to the formation of a fistulous  track or a dural tear.       8. Nerve damage: By working so close to the spinal cord, there is always a possibility of  nerve damage, which could be as serious as a permanent spinal cord injury with  paralysis.       9. Death:  Although rare, severe deadly allergic reactions known as "Anaphylactic  reaction" can occur to any of the medications  used.      10. Worsening of the symptoms:  We can always make thing worse.  What are the chances of something like this happening? Chances of any of this occuring are extremely low.  By statistics, you have more of a chance of getting killed in a motor vehicle accident: while driving to the hospital than any of the above occurring .  Nevertheless, you should be aware that they are possibilities.  In general, it is similar to taking a shower.  Everybody knows that you can slip, hit your head and get killed.  Does that mean that you should not shower again?  Nevertheless always keep in mind that statistics do not mean anything if you happen to be on the wrong side of them.  Even if a procedure has a 1 (one) in a 1,000,000 (million) chance of going wrong, it you happen to be that one..Also, keep in mind that by statistics, you have more of a chance of having something go wrong when taking medications.  Who should not have this procedure? If you are on a blood thinning medication (e.g. Coumadin, Plavix, see list of "Blood Thinners"), or if you have an active infection going on, you should not have the procedure.  If you are taking any blood thinners, please inform your physician.  How should I prepare for this procedure?  Do not eat or drink anything at least six hours prior to the procedure.  Bring a driver with you .  It cannot be a taxi.  Come accompanied by an adult that can drive you back, and that is strong enough to help you if your legs get weak or numb from the local anesthetic.  Take all of your medicines the morning of the procedure with just enough water to swallow them.  If you have diabetes, make sure that you are scheduled to have your procedure done first thing in the morning, whenever possible.  If you have diabetes, take only half of your insulin dose and notify our nurse that you have done so as soon as you arrive at the clinic.  If you are diabetic, but only take blood sugar  pills (oral hypoglycemic), then do not take them on the morning of your procedure.  You may take them after you have had the procedure.  Do not take aspirin or any aspirin-containing medications, at least eleven (11) days prior to the procedure.  They may prolong bleeding.  Wear loose fitting clothing that may be easy to take off and that you would not mind if it got stained with Betadine or blood.  Do not wear any jewelry or perfume  Remove any nail coloring.  It will interfere with some of our monitoring equipment.  NOTE: Remember that this is not meant to be interpreted as a complete list of all possible complications.  Unforeseen problems may occur.  BLOOD THINNERS The following drugs contain aspirin or other products, which can cause increased bleeding during surgery and should not be  taken for 2 weeks prior to and 1 week after surgery.  If you should need take something for relief of minor pain, you may take acetaminophen which is found in Tylenol,m Datril, Anacin-3 and Panadol. It is not blood thinner. The products listed below are.  Do not take any of the products listed below in addition to any listed on your instruction sheet.  A.P.C or A.P.C with Codeine Codeine Phosphate Capsules #3 Ibuprofen Ridaura  ABC compound Congesprin Imuran rimadil  Advil Cope Indocin Robaxisal  Alka-Seltzer Effervescent Pain Reliever and Antacid Coricidin or Coricidin-D  Indomethacin Rufen  Alka-Seltzer plus Cold Medicine Cosprin Ketoprofen S-A-C Tablets  Anacin Analgesic Tablets or Capsules Coumadin Korlgesic Salflex  Anacin Extra Strength Analgesic tablets or capsules CP-2 Tablets Lanoril Salicylate  Anaprox Cuprimine Capsules Levenox Salocol  Anexsia-D Dalteparin Magan Salsalate  Anodynos Darvon compound Magnesium Salicylate Sine-off  Ansaid Dasin Capsules Magsal Sodium Salicylate  Anturane Depen Capsules Marnal Soma  APF Arthritis pain formula Dewitt's Pills Measurin Stanback  Argesic Dia-Gesic  Meclofenamic Sulfinpyrazone  Arthritis Bayer Timed Release Aspirin Diclofenac Meclomen Sulindac  Arthritis pain formula Anacin Dicumarol Medipren Supac  Analgesic (Safety coated) Arthralgen Diffunasal Mefanamic Suprofen  Arthritis Strength Bufferin Dihydrocodeine Mepro Compound Suprol  Arthropan liquid Dopirydamole Methcarbomol with Aspirin Synalgos  ASA tablets/Enseals Disalcid Micrainin Tagament  Ascriptin Doan's Midol Talwin  Ascriptin A/D Dolene Mobidin Tanderil  Ascriptin Extra Strength Dolobid Moblgesic Ticlid  Ascriptin with Codeine Doloprin or Doloprin with Codeine Momentum Tolectin  Asperbuf Duoprin Mono-gesic Trendar  Aspergum Duradyne Motrin or Motrin IB Triminicin  Aspirin plain, buffered or enteric coated Durasal Myochrisine Trigesic  Aspirin Suppositories Easprin Nalfon Trillsate  Aspirin with Codeine Ecotrin Regular or Extra Strength Naprosyn Uracel  Atromid-S Efficin Naproxen Ursinus  Auranofin Capsules Elmiron Neocylate Vanquish  Axotal Emagrin Norgesic Verin  Azathioprine Empirin or Empirin with Codeine Normiflo Vitamin E  Azolid Emprazil Nuprin Voltaren  Bayer Aspirin plain, buffered or children's or timed BC Tablets or powders Encaprin Orgaran Warfarin Sodium  Buff-a-Comp Enoxaparin Orudis Zorpin  Buff-a-Comp with Codeine Equegesic Os-Cal-Gesic   Buffaprin Excedrin plain, buffered or Extra Strength Oxalid   Bufferin Arthritis Strength Feldene Oxphenbutazone   Bufferin plain or Extra Strength Feldene Capsules Oxycodone with Aspirin   Bufferin with Codeine Fenoprofen Fenoprofen Pabalate or Pabalate-SF   Buffets II Flogesic Panagesic   Buffinol plain or Extra Strength Florinal or Florinal with Codeine Panwarfarin   Buf-Tabs Flurbiprofen Penicillamine   Butalbital Compound Four-way cold tablets Penicillin   Butazolidin Fragmin Pepto-Bismol   Carbenicillin Geminisyn Percodan   Carna Arthritis Reliever Geopen Persantine   Carprofen Gold's salt Persistin    Chloramphenicol Goody's Phenylbutazone   Chloromycetin Haltrain Piroxlcam   Clmetidine heparin Plaquenil   Cllnoril Hyco-pap Ponstel   Clofibrate Hydroxy chloroquine Propoxyphen         Before stopping any of these medications, be sure to consult the physician who ordered them.  Some, such as Coumadin (Warfarin) are ordered to prevent or treat serious conditions such as "deep thrombosis", "pumonary embolisms", and other heart problems.  The amount of time that you may need off of the medication may also vary with the medication and the reason for which you were taking it.  If you are taking any of these medications, please make sure you notify your pain physician before you undergo any procedures.         Facet Joint Block, Care After Refer to this sheet in the next few weeks. These instructions provide you with information on caring  for yourself after your procedure. Your health care provider may also give you more specific instructions. Your treatment has been planned according to current medical practices, but problems sometimes occur. Call your health care provider if you have any problems or questions after your procedure. HOME CARE INSTRUCTIONS   Keep track of the amount of pain relief you feel and how long it lasts.  Limit pain medicine within the first 4-6 hours after the procedure as directed by your health care provider.  Resume taking dietary supplements and medicines as directed by your health care provider.  You may resume your regular diet.  Do not apply heat near or over the injection site(s) for 24 hours.   Do not take a bath or soak in water (such as a pool or lake) for 24 hours.  Do not drive for 24 hours unless approved by your health care provider.  Avoid strenuous activity for 24 hours.  Remove your bandages the morning after the procedure.   If the injection site is tender, applying an ice pack may relieve some tenderness. To do this:  Put ice in a  bag.  Place a towel between your skin and the bag.  Leave the ice on for 15-20 minutes, 3-4 times a day.  Keep follow-up appointments as directed by your health care provider. SEEK MEDICAL CARE IF:   Your pain is not controlled by your medicines.   There is drainage from the injection site.   There is significant bleeding or swelling at the injection site.  You have diabetes and your blood sugar is above 180 mg/dL. SEEK IMMEDIATE MEDICAL CARE IF:   You develop a fever of 101F (38.3C) or greater.   You have worsening pain or swelling around the injection site.   You have red streaking around the injection site.   You develop severe pain that is not controlled by your medicines.   You develop a headache, stiff neck, nausea, or vomiting.   Your eyes become very sensitive to light.   You have weakness, paralysis, or tingling in your arms or legs that was not present before the procedure.   You develop difficulty urinating or breathing.    This information is not intended to replace advice given to you by your health care provider. Make sure you discuss any questions you have with your health care provider.   Document Released: 08/28/2012 Document Revised: 10/02/2014 Document Reviewed: 08/28/2012 [employer] Interactive Patient Education 2016 [employer] Pain Management Discharge Instructions  General Discharge Instructions :  If you need to reach your doctor call: Monday-Friday 8:00 am - 4:00 pm at [phone] or toll free [phone].  After clinic hours [phone] to have operator reach doctor.  Bring all of your medication bottles to all your appointments in the pain clinic.  To cancel or reschedule your appointment with Pain Management please remember to call 24 hours in advance to avoid a fee.  Refer to the educational materials which you have been given on: General Risks, I had my Procedure. Discharge Instructions, Post Sedation.  Post Procedure  Instructions:  The drugs you were given will stay in your system until tomorrow, so for the next 24 hours you should not drive, make any legal decisions or drink any alcoholic beverages.  You may eat anything you prefer, but it is better to start with liquids then soups and crackers, and gradually work up to solid foods.  Please notify your doctor immediately if you have any unusual bleeding, trouble breathing  or pain that is not related to your normal pain.  Depending on the type of procedure that was done, some parts of your body may feel week and/or numb.  This usually clears up by tonight or the next day.  Walk with the use of an assistive device or accompanied by an adult for the 24 hours.  You may use ice on the affected area for the first 24 hours.  Put ice in a Ziploc bag and cover with a towel and place against area 15 minutes on 15 minutes off.  You may switch to heat after 24 hours.  Prescription given for Tramadol x2 and hydrocodone x2

## 2016-06-06 ENCOUNTER — Telehealth: Payer: Self-pay | Admitting: *Deleted

## 2016-07-23 ENCOUNTER — Other Ambulatory Visit: Payer: Self-pay | Admitting: Pain Medicine

## 2016-07-24 ENCOUNTER — Ambulatory Visit: Payer: Self-pay | Admitting: Pain Medicine

## 2016-08-17 ENCOUNTER — Encounter: Payer: Self-pay | Admitting: *Deleted

## 2016-08-17 DIAGNOSIS — I10 Essential (primary) hypertension: Secondary | ICD-10-CM | POA: Insufficient documentation

## 2016-08-17 DIAGNOSIS — M549 Dorsalgia, unspecified: Secondary | ICD-10-CM | POA: Insufficient documentation

## 2016-08-17 DIAGNOSIS — Z79899 Other long term (current) drug therapy: Secondary | ICD-10-CM | POA: Insufficient documentation

## 2016-08-17 DIAGNOSIS — F17210 Nicotine dependence, cigarettes, uncomplicated: Secondary | ICD-10-CM | POA: Insufficient documentation

## 2016-08-17 DIAGNOSIS — R55 Syncope and collapse: Secondary | ICD-10-CM | POA: Insufficient documentation

## 2016-08-17 LAB — CBC
HCT: 45.4 % (ref 35.0–47.0)
Hemoglobin: 15.5 g/dL (ref 12.0–16.0)
MCH: 31.1 pg (ref 26.0–34.0)
MCHC: 34.2 g/dL (ref 32.0–36.0)
MCV: 91 fL (ref 80.0–100.0)
PLATELETS: 165 10*3/uL (ref 150–440)
RBC: 4.99 MIL/uL (ref 3.80–5.20)
RDW: 12.7 % (ref 11.5–14.5)
WBC: 9.1 10*3/uL (ref 3.6–11.0)

## 2016-08-17 LAB — BASIC METABOLIC PANEL
Anion gap: 8 (ref 5–15)
BUN: 12 mg/dL (ref 6–20)
CALCIUM: 9.6 mg/dL (ref 8.9–10.3)
CO2: 30 mmol/L (ref 22–32)
CREATININE: 0.86 mg/dL (ref 0.44–1.00)
Chloride: 101 mmol/L (ref 101–111)
GFR calc Af Amer: >60 mL/min (ref >60)
GLUCOSE: 115 mg/dL — AB (ref 65–99)
Potassium: 3.6 mmol/L (ref 3.5–5.1)
Sodium: 139 mmol/L (ref 135–145)

## 2016-08-17 LAB — TROPONIN I

## 2016-08-17 NOTE — ED Triage Notes (Addendum)
Pt to triage via wheelchair.  Pt states she had a bowel movement last night and passed out.  Today pt reports elevated blood pressure.  Pt has chronic back pain and has been going to pain clinic.  Pt took blood pressure meds today.  Pt alert.  Speech clear.

## 2016-08-18 ENCOUNTER — Emergency Department
Admission: EM | Admit: 2016-08-18 | Discharge: 2016-08-18 | Disposition: A | Discharge status: Home / Self Care | Payer: Self-pay | Attending: Emergency Medicine | Admitting: Emergency Medicine

## 2016-08-18 DIAGNOSIS — R55 Syncope and collapse: Secondary | ICD-10-CM

## 2016-08-18 LAB — TROPONIN I: Troponin I: <0.03 ng/mL (ref <0.03)

## 2016-08-18 MED ORDER — KETOROLAC TROMETHAMINE 30 MG/ML IJ SOLN
30.0000 mg | Freq: Once | INTRAMUSCULAR | Status: AC
  Administered 2016-08-18: 30 mg via INTRAVENOUS
  Filled 2016-08-18: qty 1

## 2016-08-18 MED ORDER — SODIUM CHLORIDE 0.9 % IV BOLUS (SEPSIS)
1000.0000 mL | Freq: Once | INTRAVENOUS | Status: AC
  Administered 2016-08-18: 1000 mL via INTRAVENOUS

## 2016-08-18 MED ORDER — LACTULOSE 10 GM/15ML PO SOLN: 20.0000 g | Freq: Every day | ORAL | 0 refills | Status: DC | PRN

## 2016-08-18 MED ORDER — DIAZEPAM 5 MG PO TABS
5.0000 mg | ORAL_TABLET | Freq: Once | ORAL | Status: AC
  Administered 2016-08-18: 5 mg via ORAL
  Filled 2016-08-18: qty 1

## 2016-08-18 MED ORDER — LISINOPRIL 20 MG PO TABS: 20.0000 mg | ORAL_TABLET | Freq: Every day | ORAL | 0 refills | Status: DC

## 2016-08-18 NOTE — ED Provider Notes (Signed)
[facility] Emergency Department Provider Note   ____________________________________________   First MD Initiated Contact with Patient 08/18/16 0100     (approximate)  I have reviewed the triage vital signs and the nursing notes.   HISTORY  Chief Complaint Loss of Consciousness    HPI Crystal Roberson is a 50 y.o. female who presents to the ED from home with a chief complaint of syncope. Patient is a pain clinic patient who has chronic back pain and constipation secondary to opiate pain medications. States she took a laxative last evening and was straining to have a bowel movement when she became clammy, nauseated and passed out. Saw her pain clinic doctor earlier this week for medication refills and noted her diastolic blood pressure was 115.Her pain clinic doctor recently moved location so she is having trouble getting a "procedure" for her back which usually alleviates her pain. Denies recent fever, chills, chest pain, shortness of breath, abdominal pain, vomiting, diarrhea. Denies recent travel or trauma. Nothing makes her symptoms better or worse.   Past Medical History:  Diagnosis Date  . Allergy   . Chicken pox   . Depression   . Hypertension   . Migraines     Patient Active Problem List   Diagnosis Date Noted  . 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 Anxiety 01/13/2016  . Chronic pain 11/07/2015  . Essential hypertension 11/07/2015  . Insomnia 11/07/2015  . Nicotine dependence 11/07/2015  . Depression 11/07/2015  . Facet syndrome, lumbar 03/11/2015  . Lumbar radiculopathy 03/11/2015  . Bilateral occipital neuralgia 02/07/2015  . 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 DDD (degenerative disc disease), lumbosacral 01/28/2015  . 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 CERVICAL DISC SURGERY    . TUBAL LIGATION      Prior to Admission medications   Medication Sig Start Date End Date  Taking? Authorizing Provider  busPIRone (BUSPAR) 7.5 MG tablet Take 1 tablet (7.5 mg total) by mouth 2 (two) times daily. 01/12/16   Tommie Sams, DO  cefUROXime (CEFTIN) 250 MG tablet Take 1 tablet (250 mg total) by mouth 2 (two) times daily with a meal. Patient not taking: Reported on 05/24/2016 05/01/16   Ewing Schlein, MD  cefUROXime (CEFTIN) 250 MG tablet Take 1 tablet (250 mg total) by mouth 2 (two) times daily with a meal. 05/24/16   Ewing Schlein, MD  FLUoxetine (PROZAC) 20 MG capsule Take 2 capsules (40 mg total) by mouth daily. 01/12/16   Tommie Sams, DO  HYDROcodone-acetaminophen (NORCO/VICODIN) 5-325 MG tablet Limit 1-3 tablets po daily if tolerated for breakthrough pain while taking tramadol 05/24/16   Ewing Schlein, MD  HYDROcodone-acetaminophen (NORCO/VICODIN) 5-325 MG tablet Limit 1-3 tablets po daily if tolerated for breakthrough pain while taking tramadol 05/24/16   Ewing Schlein, MD  lactulose (CHRONULAC) 10 GM/15ML solution Take 30 mLs (20 g total) by mouth daily as needed for mild constipation. 08/18/16   Irean Hong, MD  lidocaine (LIDODERM) 5 % Place 1 patch onto the skin daily. Apply 1-3 patches to skin  for 12 hours then Remove patches for 12 hours. Repeat process if tolerated 03/11/15   Ewing Schlein, MD  lisinopril (PRINIVIL,ZESTRIL) 20 MG tablet Take 1 tablet (20 mg total) by mouth daily. 08/18/16   Irean Hong, MD  lisinopril-hydrochlorothiazide (PRINZIDE,ZESTORETIC) 20-25 MG tablet Take 1 tablet by mouth daily. 11/07/15   Tommie Sams, DO  meloxicam (MOBIC) 7.5 MG tablet  Limit one tablet by mouth per day if tolerated 05/01/16   Ewing SchleinGregory Crisp, MD  orphenadrine (NORFLEX) 100 MG tablet Limit  1 tab po / day or bid if tolerated 05/01/16   Ewing SchleinGregory Crisp, MD  potassium chloride SA (K-DUR,KLOR-CON) 20 MEQ tablet 1 tablet twice daily for 2 days. Patient not taking: Reported on 05/24/2016 11/07/15   Tommie SamsJayce G Cook, DO  tiZANidine (ZANAFLEX) 2 MG tablet Limit 1 tablet by mouth per day or 2  tablets by mouth per day if tolerated 05/01/16   Ewing SchleinGregory Crisp, MD  traMADol Janean Sark(ULTRAM) 50 MG tablet Limit 2-4 tablets po daily if tolerated 05/24/16   Ewing SchleinGregory Crisp, MD  traMADol Janean Sark(ULTRAM) 50 MG tablet Limit 2-4 tablets po daily if tolerated 05/24/16   Ewing SchleinGregory Crisp, MD  zolpidem (AMBIEN) 10 MG tablet Take 1 tablet (10 mg total) by mouth at bedtime as needed for sleep. Patient not taking: Reported on 05/24/2016 11/07/15   Tommie SamsJayce G Cook, DO    Allergies Pregabalin and Codeine  Family History  Problem Relation Age of Onset  . Hypertension Mother   . Arthritis Mother   . Hyperlipidemia Mother   . Mental illness Mother   . Hypertension Maternal Grandmother   . Heart disease Maternal Grandmother     Social History Social History  Substance Use Topics  . Smoking status: Current Every Day Smoker    Packs/day: 0.75    Years: 30.00    Types: Cigarettes  . Smokeless tobacco: Never Used  . Alcohol use No    Review of Systems  Constitutional: No fever/chills. Eyes: No visual changes. ENT: No sore throat. Cardiovascular: Denies chest pain. Respiratory: Denies shortness of breath. Gastrointestinal: No abdominal pain.  No nausea, no vomiting.  No diarrhea.  No constipation. Genitourinary: Negative for dysuria. Musculoskeletal: Positive for back pain. Skin: Negative for rash. Neurological: Positive for syncope. Negative for headaches, focal weakness or numbness.  10-point ROS otherwise negative.  ____________________________________________   PHYSICAL EXAM:  VITAL SIGNS: ED Triage Vitals  Enc Vitals Group     BP 08/17/16 2242 (!) 162/109     Pulse Rate 08/17/16 2242 94     Resp 08/17/16 2242 20     Temp 08/17/16 2242 98.6 F (37 C)     Temp Source 08/17/16 2242 Oral     SpO2 08/17/16 2242 98 %     Weight 08/17/16 2244 136 lb (61.7 kg)     Height 08/17/16 2244 5\' 5"  (1.651 m)     Head Circumference --      Peak Flow --      Pain Score 08/17/16 2244 8     Pain Loc --      Pain  Edu? --      Excl. in GC? --     Constitutional: Alert and oriented. Well appearing and in mild acute distress. Eyes: Conjunctivae are normal. PERRL. EOMI. Head: Atraumatic. Nose: No congestion/rhinnorhea. Mouth/Throat: Mucous membranes are moist.  Oropharynx non-erythematous. Neck: No stridor.  No cervical spine tenderness to palpation. Cardiovascular: Normal rate, regular rhythm. Grossly normal heart sounds.  Good peripheral circulation. Respiratory: Normal respiratory effort.  No retractions. Lungs CTAB. Gastrointestinal: Soft and nontender to light or deep palpation. No distention. No abdominal bruits. No CVA tenderness. Musculoskeletal: No midline spinal tenderness. Lumbar paraspinal muscle spasms. Limited range of motion secondary to pain. No lower extremity tenderness nor edema.  No joint effusions. Neurologic:  Normal speech and language. No gross focal neurologic deficits are appreciated.  Skin:  Skin is warm, dry and intact. No rash noted. Psychiatric: Mood and affect are normal. Speech and behavior are normal.  ____________________________________________   LABS (all labs ordered are listed, but only abnormal results are displayed)  Labs Reviewed  BASIC METABOLIC PANEL - Abnormal; Notable for the following:       Result Value   Glucose, Bld 115 (*)    All other components within normal limits  CBC  TROPONIN I  TROPONIN I   ____________________________________________  EKG  ED ECG REPORT I, SUNG,JADE J, the attending physician, personally viewed and interpreted this ECG.   Date: 08/18/2016  EKG Time: 2250  Rate: 85  Rhythm: normal EKG, normal sinus rhythm  Axis: Normal  Intervals:none  ST&T Change: Nonspecific  ____________________________________________  RADIOLOGY  None ____________________________________________   PROCEDURES  Procedure(s) performed: None  Procedures  Critical Care performed:  No  ____________________________________________   INITIAL IMPRESSION / ASSESSMENT AND PLAN / ED COURSE  Pertinent labs & imaging results that were available during my care of the patient were reviewed by me and considered in my medical decision making (see chart for details).  50 year old female who presents with syncope while straining to have a bowel movement. She is constipated secondary to opiate pain medications dispense from her pain doctor. We will administer nonnarcotic analgesia, muscle relaxer. Will obtain orthostatic vital signs and repeat troponin.  Clinical Course as of Aug 18 424  Caleen EssexFri Aug 18, 2016  [id number] Repeat timed troponin remains negative. Patient is feeling better. She will follow-up with her pain clinic doctor for her chronic back pain. Strict return precautions given. Patient and spouse verbalize understanding and agreed with plan of care.  [JS]  [id number] Prescription for lactulose provided. Patient currently takes lisinopril 20 mg/HCTZ 25 mg daily. Will add lisinopril 20 mg for additional blood pressure control.  [JS]    Clinical Course User Index [JS] Irean HongJade J Sung, MD     ____________________________________________   FINAL CLINICAL IMPRESSION(S) / ED DIAGNOSES  Final diagnoses:  Syncope, unspecified syncope type      NEW MEDICATIONS STARTED DURING THIS VISIT:  New Prescriptions   LACTULOSE (CHRONULAC) 10 GM/15ML SOLUTION    Take 30 mLs (20 g total) by mouth daily as needed for mild constipation.   LISINOPRIL (PRINIVIL,ZESTRIL) 20 MG TABLET    Take 1 tablet (20 mg total) by mouth daily.     Note:  This document was prepared using Dragon voice recognition software and may include unintentional dictation errors.    Irean HongJade J Sung, MD 08/18/16 [phone]

## 2016-08-18 NOTE — ED Notes (Signed)
Pt has chronic back pain for which she sees pain management for. Pt was seen Monday and told that her BP was elevated. Pt stating that she gets constipated from her pain medications and takes laxatives. Pt stating that while she was having a BM she became hot, clammy, and nauseated. Pt stating then she just remembers waking up on the floor. Pt stating that her BP is high due to her back pain. Pt stating that she gets "shots in her back" to help with the pain but since her doctor has left the office she goes to she has not had the shot in over a month, and believes that is why her pain is out of control.

## 2016-08-18 NOTE — Discharge Instructions (Signed)
1. Continue daily medications as prescribed by your doctor. 2. Start extra lisinopril tablet along with your current blood pressure medicine. 3. You may take laxatives as needed (lactulose). 4. Return to the ER for recurrent or worsening symptoms, persistent vomiting, difficulty breathing or other concerns.

## 2016-08-21 ENCOUNTER — Other Ambulatory Visit: Payer: Self-pay | Admitting: Family Medicine

## 2016-08-21 ENCOUNTER — Ambulatory Visit (INDEPENDENT_AMBULATORY_CARE_PROVIDER_SITE_OTHER): Payer: Self-pay | Admitting: Family Medicine

## 2016-08-21 ENCOUNTER — Encounter: Payer: Self-pay | Admitting: Family Medicine

## 2016-08-21 VITALS — BP 124/92 | HR 80 | Temp 98.0°F | Wt 139.2 lb

## 2016-08-21 DIAGNOSIS — G8929 Other chronic pain: Secondary | ICD-10-CM

## 2016-08-21 DIAGNOSIS — I10 Essential (primary) hypertension: Secondary | ICD-10-CM

## 2016-08-21 MED ORDER — ZOLPIDEM TARTRATE 10 MG PO TABS: 10.0000 mg | ORAL_TABLET | Freq: Every evening | ORAL | 0 refills | Status: DC | PRN

## 2016-08-21 MED ORDER — LISINOPRIL 20 MG PO TABS: 20.0000 mg | ORAL_TABLET | Freq: Every day | ORAL | 0 refills | Status: DC

## 2016-08-21 NOTE — Progress Notes (Signed)
Pre visit review using our clinic review tool, if applicable. No additional management support is needed unless otherwise documented below in the visit note. 

## 2016-08-21 NOTE — Patient Instructions (Signed)
We will arrange the referral.  Follow up in 1-2 weeks.  Take care  Dr. Adriana Simasook

## 2016-08-21 NOTE — Assessment & Plan Note (Signed)
Established problem, recent worsening. BP mildly elevated today. Will continue current meds and re-evaluate in 1-2 weeks (needs labs then given dose increase in lisinopril).

## 2016-08-21 NOTE — Progress Notes (Signed)
Subjective:  Patient ID: Crystal Roberson, female    DOB: [birth year]  Age: 50 y.o. MRN: [id number]  CC: HTN follow up  HPI:  50 year old female with chronic pain, hypertension, anxiety presents for follow-up regarding hypertension.  Patient recently suffered a syncopal episode and was seen in the emergency department. This was attributed to vasovagal syncope as she was on the toilet straining due to constipation. During her emergency visit stay her blood pressure was found to be markedly elevated. Her blood pressure medications increased additional lisinopril was added.  Patient resents today for follow-up. She endorses compliance with lisinopril 20 mg and an additional lisinopril 20 mg combined with HCTZ. Blood pressures have been improving per her report. She does note that her blood pressures have been intermittently elevated as she is in constant pain. She states her back pain has been worsening recently. She has been unable to receive injection therapy by her pain physician as [place] and has not had a procedure site/suite to do procedures in. No other complaints or concerns at this time.  Social Hx   Social History   Social History  . Marital status: Divorced    Spouse name: N/A  . Number of children: N/A  . Years of education: N/A   Social History Main Topics  . Smoking status: Current Every Day Smoker    Packs/day: 0.75    Years: 30.00    Types: Cigarettes  . Smokeless tobacco: Never Used  . Alcohol use No  . Drug use: No  . Sexual activity: Not Currently   Other Topics Concern  . None   Social History Narrative  . None    Review of Systems  Cardiovascular:       Elevated BP.  Musculoskeletal: Positive for back pain.   Objective:  BP (!) 124/92 (BP Location: Left Arm, Patient Position: Sitting, Cuff Size: Normal)   Pulse 80   Temp 98 F (36.7 C) (Oral)   Wt 139 lb 4 oz (63.2 kg)   LMP  (Within Years)   SpO2 97%   BMI 23.17 kg/m   BP/Weight  08/21/2016 08/18/2016 08/17/2016  Systolic BP 124 176 -  Diastolic BP 92 111 -  Wt. (Lbs) 139.25 - 136  BMI 23.17 22.63 -   Physical Exam  Constitutional: She appears well-developed.  Appears in pain.  Cardiovascular: Normal rate and regular rhythm.   Pulmonary/Chest: Effort normal and breath sounds normal.  Abdominal: Soft. She exhibits no distension.  Psychiatric:  Flat affect.  Vitals reviewed.   Lab Results  Component Value Date   WBC 9.1 08/17/2016   HGB 15.5 08/17/2016   HCT 45.4 08/17/2016   PLT 165 08/17/2016   GLUCOSE 115 (H) 08/17/2016   CHOL 245 (H) 11/05/2015   TRIG 140.0 11/05/2015   HDL 54.30 11/05/2015   LDLCALC 163 (H) 11/05/2015   ALT 12 11/09/2015   AST 16 11/09/2015   NA 139 08/17/2016   K 3.6 08/17/2016   CL 101 08/17/2016   CREATININE 0.86 08/17/2016   BUN 12 08/17/2016   CO2 30 08/17/2016    Assessment & Plan:   Problem List Items Addressed This Visit    Essential hypertension    Established problem, recent worsening. BP mildly elevated today. Will continue current meds and re-evaluate in 1-2 weeks (needs labs then given dose increase in lisinopril).      Relevant Medications   lisinopril (PRINIVIL,ZESTRIL) 20 MG tablet   Chronic pain - Primary  Relevant Orders   Ambulatory referral to Pain Clinic      Meds ordered this encounter  Medications  . DISCONTD: lisinopril (PRINIVIL,ZESTRIL) 20 MG tablet    Sig: Take 1 tablet (20 mg total) by mouth daily.    Dispense:  90 tablet    Refill:  0  . zolpidem (AMBIEN) 10 MG tablet    Sig: Take 1 tablet (10 mg total) by mouth at bedtime as needed for sleep.    Dispense:  30 tablet    Refill:  0  . lisinopril (PRINIVIL,ZESTRIL) 20 MG tablet    Sig: Take 1 tablet (20 mg total) by mouth daily.    Dispense:  90 tablet    Refill:  0    Follow-up: 1-2 weeks for BP follow up  Everlene OtherJayce Niva Murren DO [facility] [place]

## 2016-08-28 ENCOUNTER — Encounter: Payer: Self-pay | Admitting: Family Medicine

## 2016-08-28 ENCOUNTER — Ambulatory Visit (INDEPENDENT_AMBULATORY_CARE_PROVIDER_SITE_OTHER): Payer: Self-pay | Admitting: Family Medicine

## 2016-08-28 VITALS — BP 126/86 | HR 110 | Resp 14

## 2016-08-28 DIAGNOSIS — I10 Essential (primary) hypertension: Secondary | ICD-10-CM

## 2016-08-28 NOTE — Patient Instructions (Signed)
Continue your meds.   We will call tomorrow regarding the referral.  Take care  Dr. Adriana Simasook

## 2016-08-29 ENCOUNTER — Other Ambulatory Visit: Payer: Self-pay | Admitting: Family Medicine

## 2016-08-29 LAB — BASIC METABOLIC PANEL
BUN: 10 mg/dL (ref 6–23)
CO2: 29 meq/L (ref 19–32)
Calcium: 9.5 mg/dL (ref 8.4–10.5)
Chloride: 100 mEq/L (ref 96–112)
Creatinine, Ser: 0.88 mg/dL (ref 0.40–1.20)
GFR: 71.48 mL/min (ref >60.00)
GLUCOSE: 107 mg/dL — AB (ref 70–99)
POTASSIUM: 3.3 meq/L — AB (ref 3.5–5.1)
Sodium: 139 mEq/L (ref 135–145)

## 2016-08-29 MED ORDER — POTASSIUM CHLORIDE CRYS ER 20 MEQ PO TBCR: EXTENDED_RELEASE_TABLET | ORAL | 0 refills | Status: DC

## 2016-08-29 NOTE — Progress Notes (Signed)
   Subjective:  Patient ID: Crystal Roberson, female    DOB: [birth year]  Age: 50 y.o. MRN: [id number]  CC: Follow up BP  HPI:  50 year old female presents for follow-up regarding hypertension.  HTN  BP improving.  Patient is currently doing well on lisinopril and HCTZ.  Is in need of laboratory studies today given recent increase in lisinopril.  Social Hx   Social History   Social History  . Marital status: Divorced    Spouse name: N/A  . Number of children: N/A  . Years of education: N/A   Social History Main Topics  . Smoking status: Current Every Day Smoker    Packs/day: 0.75    Years: 30.00    Types: Cigarettes  . Smokeless tobacco: Never Used  . Alcohol use No  . Drug use: No  . Sexual activity: Not Currently   Other Topics Concern  . None   Social History Narrative  . None    Review of Systems  Constitutional: Positive for fatigue.  Musculoskeletal: Positive for back pain.   Objective:  BP 126/86 (BP Location: Left Arm, Patient Position: Sitting, Cuff Size: Normal)   Pulse (!) 110   Resp 14   LMP  (Within Years)   SpO2 98%   BP/Weight 08/28/2016 08/21/2016 08/18/2016  Systolic BP 126 124 176  Diastolic BP 86 92 111  Wt. (Lbs) - 139.25 -  BMI - 23.17 22.63   Physical Exam  Constitutional: She is oriented to person, place, and time.  Appears in pain.  Cardiovascular: Regular rhythm.  Tachycardia present.   Pulmonary/Chest: Effort normal and breath sounds normal.  Neurological: She is alert and oriented to person, place, and time.  Psychiatric:  Flat affect, depressed mood.  Vitals reviewed.  Lab Results  Component Value Date   WBC 9.1 08/17/2016   HGB 15.5 08/17/2016   HCT 45.4 08/17/2016   PLT 165 08/17/2016   GLUCOSE 107 (H) 08/28/2016   CHOL 245 (H) 11/05/2015   TRIG 140.0 11/05/2015   HDL 54.30 11/05/2015   LDLCALC 163 (H) 11/05/2015   ALT 12 11/09/2015   AST 16 11/09/2015   NA 139 08/28/2016   K 3.3 (L) 08/28/2016   CL 100  08/28/2016   CREATININE 0.88 08/28/2016   BUN 10 08/28/2016   CO2 29 08/28/2016   Assessment & Plan:   Problem List Items Addressed This Visit    Essential hypertension - Primary    Establish problem, improving. Continue current dosing lisinopril/HCTZ 20 mg/25 mg,  an additional lisinopril 20 mg.      Relevant Orders   Basic Metabolic Panel (BMET) (Completed)     Follow-up: As scheduled.   Everlene OtherJayce Steed Kanaan DO [facility] [place]

## 2016-08-29 NOTE — Assessment & Plan Note (Signed)
Establish problem, improving. Continue current dosing lisinopril/HCTZ 20 mg/25 mg,  an additional lisinopril 20 mg.

## 2016-08-30 ENCOUNTER — Telehealth: Payer: Self-pay | Admitting: Family Medicine

## 2016-08-30 NOTE — Telephone Encounter (Signed)
[facility] returned my call. They state that they will not be able to see Crystal Roberson because Dr. Fransico MeadowNevierra is not accepting any patients now.  I could send her to [facility] with Dr. Welton FlakesKhan. Please advise.

## 2016-08-30 NOTE — Telephone Encounter (Signed)
Ask patient her preference.

## 2016-08-31 ENCOUNTER — Other Ambulatory Visit: Payer: Self-pay | Admitting: Family Medicine

## 2016-08-31 DIAGNOSIS — G8929 Other chronic pain: Secondary | ICD-10-CM

## 2016-08-31 NOTE — Telephone Encounter (Signed)
Pt was called and stated shed be okay with [facility].

## 2016-09-12 ENCOUNTER — Encounter (HOSPITAL_COMMUNITY): Payer: Self-pay | Admitting: Emergency Medicine

## 2016-09-12 ENCOUNTER — Emergency Department (HOSPITAL_COMMUNITY): Payer: Self-pay

## 2016-09-12 ENCOUNTER — Inpatient Hospital Stay (HOSPITAL_COMMUNITY)
Admission: EM | Admit: 2016-09-12 | Discharge: 2016-09-14 | DRG: 312 | Disposition: A | Discharge status: Home / Self Care | Payer: Self-pay | Attending: Internal Medicine | Admitting: Internal Medicine

## 2016-09-12 DIAGNOSIS — Z79899 Other long term (current) drug therapy: Secondary | ICD-10-CM

## 2016-09-12 DIAGNOSIS — F17210 Nicotine dependence, cigarettes, uncomplicated: Secondary | ICD-10-CM | POA: Diagnosis present

## 2016-09-12 DIAGNOSIS — Z8249 Family history of ischemic heart disease and other diseases of the circulatory system: Secondary | ICD-10-CM

## 2016-09-12 DIAGNOSIS — G4709 Other insomnia: Secondary | ICD-10-CM

## 2016-09-12 DIAGNOSIS — Z888 Allergy status to other drugs, medicaments and biological substances status: Secondary | ICD-10-CM

## 2016-09-12 DIAGNOSIS — G8929 Other chronic pain: Secondary | ICD-10-CM | POA: Diagnosis present

## 2016-09-12 DIAGNOSIS — R55 Syncope and collapse: Secondary | ICD-10-CM | POA: Diagnosis present

## 2016-09-12 DIAGNOSIS — R45851 Suicidal ideations: Secondary | ICD-10-CM | POA: Diagnosis present

## 2016-09-12 DIAGNOSIS — R002 Palpitations: Secondary | ICD-10-CM

## 2016-09-12 DIAGNOSIS — Z9119 Patient's noncompliance with other medical treatment and regimen: Secondary | ICD-10-CM

## 2016-09-12 DIAGNOSIS — E876 Hypokalemia: Secondary | ICD-10-CM | POA: Diagnosis present

## 2016-09-12 DIAGNOSIS — I10 Essential (primary) hypertension: Secondary | ICD-10-CM | POA: Diagnosis present

## 2016-09-12 DIAGNOSIS — I951 Orthostatic hypotension: Principal | ICD-10-CM | POA: Diagnosis present

## 2016-09-12 DIAGNOSIS — G4700 Insomnia, unspecified: Secondary | ICD-10-CM | POA: Diagnosis present

## 2016-09-12 DIAGNOSIS — Z885 Allergy status to narcotic agent status: Secondary | ICD-10-CM

## 2016-09-12 DIAGNOSIS — Z91199 Patient's noncompliance with other medical treatment and regimen due to unspecified reason: Secondary | ICD-10-CM

## 2016-09-12 DIAGNOSIS — F329 Major depressive disorder, single episode, unspecified: Secondary | ICD-10-CM | POA: Diagnosis present

## 2016-09-12 DIAGNOSIS — M5416 Radiculopathy, lumbar region: Secondary | ICD-10-CM | POA: Diagnosis present

## 2016-09-12 DIAGNOSIS — K5903 Drug induced constipation: Secondary | ICD-10-CM | POA: Diagnosis present

## 2016-09-12 DIAGNOSIS — M545 Low back pain, unspecified: Secondary | ICD-10-CM | POA: Diagnosis present

## 2016-09-12 DIAGNOSIS — F410 Panic disorder [episodic paroxysmal anxiety] without agoraphobia: Secondary | ICD-10-CM | POA: Diagnosis present

## 2016-09-12 DIAGNOSIS — F32A Depression, unspecified: Secondary | ICD-10-CM | POA: Diagnosis present

## 2016-09-12 DIAGNOSIS — Z818 Family history of other mental and behavioral disorders: Secondary | ICD-10-CM

## 2016-09-12 HISTORY — DX: Syncope and collapse: R55

## 2016-09-12 HISTORY — DX: Other chronic pain: G89.29

## 2016-09-12 HISTORY — DX: Dorsalgia, unspecified: M54.9

## 2016-09-12 HISTORY — DX: Other intervertebral disc degeneration, lumbar region without mention of lumbar back pain or lower extremity pain: M51.369

## 2016-09-12 HISTORY — DX: Other intervertebral disc degeneration, lumbar region: M51.36

## 2016-09-12 LAB — CBC WITH DIFFERENTIAL/PLATELET
BASOS PCT: 1 %
Basophils Absolute: 0.1 10*3/uL (ref 0.0–0.1)
Eosinophils Absolute: 0.1 10*3/uL (ref 0.0–0.7)
Eosinophils Relative: 1 %
HEMATOCRIT: 43 % (ref 36.0–46.0)
HEMOGLOBIN: 15 g/dL (ref 12.0–15.0)
LYMPHS ABS: 2.6 10*3/uL (ref 0.7–4.0)
LYMPHS PCT: 28 %
MCH: 31.4 pg (ref 26.0–34.0)
MCHC: 34.9 g/dL (ref 30.0–36.0)
MCV: 90.1 fL (ref 78.0–100.0)
MONO ABS: 0.5 10*3/uL (ref 0.1–1.0)
MONOS PCT: 5 %
NEUTROS ABS: 6.2 10*3/uL (ref 1.7–7.7)
NEUTROS PCT: 65 %
Platelets: 197 10*3/uL (ref 150–400)
RBC: 4.77 MIL/uL (ref 3.87–5.11)
RDW: 12.2 % (ref 11.5–15.5)
WBC: 9.3 10*3/uL (ref 4.0–10.5)

## 2016-09-12 LAB — URINALYSIS, ROUTINE W REFLEX MICROSCOPIC
BILIRUBIN URINE: NEGATIVE
Glucose, UA: NEGATIVE mg/dL
Ketones, ur: NEGATIVE mg/dL
Nitrite: NEGATIVE
PROTEIN: NEGATIVE mg/dL
Specific Gravity, Urine: 1.009 (ref 1.005–1.030)
pH: 6 (ref 5.0–8.0)

## 2016-09-12 LAB — RAPID URINE DRUG SCREEN, HOSP PERFORMED
AMPHETAMINES: NOT DETECTED
BENZODIAZEPINES: NOT DETECTED
Barbiturates: NOT DETECTED
Cocaine: NOT DETECTED
Opiates: POSITIVE — AB
TETRAHYDROCANNABINOL: NOT DETECTED

## 2016-09-12 LAB — BASIC METABOLIC PANEL
Anion gap: 12 (ref 5–15)
BUN: 8 mg/dL (ref 6–20)
CHLORIDE: 103 mmol/L (ref 101–111)
CO2: 24 mmol/L (ref 22–32)
CREATININE: 0.89 mg/dL (ref 0.44–1.00)
Calcium: 9.4 mg/dL (ref 8.9–10.3)
GFR calc non Af Amer: >60 mL/min (ref >60)
GLUCOSE: 139 mg/dL — AB (ref 65–99)
Potassium: 2.9 mmol/L — ABNORMAL LOW (ref 3.5–5.1)
Sodium: 139 mmol/L (ref 135–145)

## 2016-09-12 LAB — ACETAMINOPHEN LEVEL: Acetaminophen (Tylenol), Serum: <10 ug/mL — ABNORMAL LOW (ref 10–30)

## 2016-09-12 LAB — I-STAT BETA HCG BLOOD, ED (MC, WL, AP ONLY)

## 2016-09-12 LAB — I-STAT TROPONIN, ED: Troponin i, poc: 0 ng/mL (ref 0.00–0.08)

## 2016-09-12 LAB — SALICYLATE LEVEL

## 2016-09-12 LAB — CBG MONITORING, ED: Glucose-Capillary: 159 mg/dL — ABNORMAL HIGH (ref 65–99)

## 2016-09-12 MED ORDER — ORPHENADRINE CITRATE ER 100 MG PO TB12: 100.0000 mg | ORAL_TABLET | Freq: Two times a day (BID) | ORAL | Status: DC | PRN

## 2016-09-12 MED ORDER — ONDANSETRON HCL 4 MG PO TABS: 4.0000 mg | ORAL_TABLET | Freq: Four times a day (QID) | ORAL | Status: DC | PRN

## 2016-09-12 MED ORDER — POTASSIUM CHLORIDE CRYS ER 20 MEQ PO TBCR
40.0000 meq | EXTENDED_RELEASE_TABLET | Freq: Two times a day (BID) | ORAL | Status: DC
  Administered 2016-09-12 – 2016-09-14 (×5): 40 meq via ORAL
  Filled 2016-09-12: qty 4
  Filled 2016-09-12 (×4): qty 2

## 2016-09-12 MED ORDER — ACETAMINOPHEN 325 MG PO TABS: 650.0000 mg | ORAL_TABLET | Freq: Four times a day (QID) | ORAL | Status: DC | PRN

## 2016-09-12 MED ORDER — ONDANSETRON 8 MG PO TBDP
8.0000 mg | ORAL_TABLET | Freq: Once | ORAL | Status: DC
  Filled 2016-09-12: qty 2

## 2016-09-12 MED ORDER — SODIUM CHLORIDE 0.9 % IV SOLN
30.0000 meq | Freq: Once | INTRAVENOUS | Status: AC
  Administered 2016-09-12: 30 meq via INTRAVENOUS
  Filled 2016-09-12: qty 15

## 2016-09-12 MED ORDER — FLUOXETINE HCL 20 MG PO CAPS
40.0000 mg | ORAL_CAPSULE | Freq: Every day | ORAL | Status: DC
  Administered 2016-09-12 – 2016-09-14 (×3): 40 mg via ORAL
  Filled 2016-09-12 (×3): qty 2

## 2016-09-12 MED ORDER — SODIUM CHLORIDE 0.9 % IV SOLN: 250.0000 mL | INTRAVENOUS | Status: DC | PRN

## 2016-09-12 MED ORDER — SODIUM CHLORIDE 0.9% FLUSH
3.0000 mL | Freq: Two times a day (BID) | INTRAVENOUS | Status: DC
  Administered 2016-09-13: 3 mL via INTRAVENOUS

## 2016-09-12 MED ORDER — LISINOPRIL 20 MG PO TABS
20.0000 mg | ORAL_TABLET | Freq: Every day | ORAL | Status: DC
  Administered 2016-09-13 – 2016-09-14 (×2): 20 mg via ORAL
  Filled 2016-09-12 (×2): qty 1

## 2016-09-12 MED ORDER — SODIUM CHLORIDE 0.9% FLUSH: 3.0000 mL | Freq: Two times a day (BID) | INTRAVENOUS | Status: DC

## 2016-09-12 MED ORDER — ENOXAPARIN SODIUM 40 MG/0.4ML ~~LOC~~ SOLN
40.0000 mg | SUBCUTANEOUS | Status: DC
Start: 2016-09-12 — End: 2016-09-12

## 2016-09-12 MED ORDER — KETOROLAC TROMETHAMINE 30 MG/ML IJ SOLN: 30.0000 mg | Freq: Four times a day (QID) | INTRAMUSCULAR | Status: DC | PRN

## 2016-09-12 MED ORDER — HYDRALAZINE HCL 20 MG/ML IJ SOLN: 5.0000 mg | INTRAMUSCULAR | Status: DC | PRN

## 2016-09-12 MED ORDER — ENOXAPARIN SODIUM 40 MG/0.4ML ~~LOC~~ SOLN
40.0000 mg | SUBCUTANEOUS | Status: DC
  Administered 2016-09-12 – 2016-09-13 (×2): 40 mg via SUBCUTANEOUS
  Filled 2016-09-12 (×2): qty 0.4

## 2016-09-12 MED ORDER — ZOLPIDEM TARTRATE 5 MG PO TABS: 10.0000 mg | ORAL_TABLET | Freq: Every evening | ORAL | Status: DC | PRN

## 2016-09-12 MED ORDER — TRAMADOL HCL 50 MG PO TABS
50.0000 mg | ORAL_TABLET | Freq: Four times a day (QID) | ORAL | Status: DC
  Administered 2016-09-12 – 2016-09-14 (×7): 50 mg via ORAL
  Filled 2016-09-12 (×7): qty 1

## 2016-09-12 MED ORDER — HYDROCODONE-ACETAMINOPHEN 5-325 MG PO TABS
1.0000 | ORAL_TABLET | Freq: Four times a day (QID) | ORAL | Status: DC | PRN
  Administered 2016-09-12 – 2016-09-14 (×4): 1 via ORAL
  Filled 2016-09-12 (×4): qty 1

## 2016-09-12 MED ORDER — ONDANSETRON HCL 4 MG/2ML IJ SOLN: 4.0000 mg | Freq: Four times a day (QID) | INTRAMUSCULAR | Status: DC | PRN

## 2016-09-12 MED ORDER — DIAZEPAM 5 MG PO TABS
5.0000 mg | ORAL_TABLET | Freq: Once | ORAL | Status: AC
  Administered 2016-09-12: 5 mg via ORAL
  Filled 2016-09-12: qty 1

## 2016-09-12 MED ORDER — SODIUM CHLORIDE 0.9% FLUSH: 3.0000 mL | INTRAVENOUS | Status: DC | PRN

## 2016-09-12 MED ORDER — ACETAMINOPHEN 650 MG RE SUPP: 650.0000 mg | Freq: Four times a day (QID) | RECTAL | Status: DC | PRN

## 2016-09-12 MED ORDER — TIZANIDINE HCL 4 MG PO TABS
4.0000 mg | ORAL_TABLET | Freq: Two times a day (BID) | ORAL | Status: DC
  Administered 2016-09-12 – 2016-09-14 (×4): 4 mg via ORAL
  Filled 2016-09-12 (×5): qty 1

## 2016-09-12 MED ORDER — POTASSIUM CHLORIDE CRYS ER 20 MEQ PO TBCR
40.0000 meq | EXTENDED_RELEASE_TABLET | Freq: Once | ORAL | Status: AC
  Administered 2016-09-12: 40 meq via ORAL
  Filled 2016-09-12: qty 2

## 2016-09-12 NOTE — ED Notes (Signed)
Sitter at bedside.

## 2016-09-12 NOTE — ED Notes (Signed)
Pt transported to x-ray w/ sitter.

## 2016-09-12 NOTE — ED Notes (Addendum)
Pt now c/o suidcidal thoughts for the last 2 years since having problems with pain management and getting procedures done to control her chronic back pain. Pt stated "I've thought everyone would be better off if I wasn't here."

## 2016-09-12 NOTE — ED Triage Notes (Signed)
Pt here with c/co HTN. While checking in today passed out at registration. Pt reports similar episode of syncope and seen here for same on Thanksgiving. Pt reports lightheaded sensation, heart racing  And SOB during syncopal events. Denies chest pain. Pt alert, oriented x4, NAD at present. CBG 159.

## 2016-09-12 NOTE — ED Provider Notes (Signed)
[facility]-EMERGENCY DEPT Provider Note   CSN: [id number] Arrival date & time: 09/12/16  1023     History   Chief Complaint Chief Complaint  Patient presents with  . Loss of Consciousness    HPI Crystal Roberson is a 50 y.o. female.  HPI Crystal Roberson is a 50 y.o. female with PMH significant for depression, HTN (on lisinopril/HCTZ and lisinopril), chronic back pain who presents with elevated blood pressure.  She states her SBP was over 200 yesterday at her pain clinic and they told her she needed to be admitted to the hospital.  Today, while at registration in the ED she became hot/flushed, had palpitations, and became short of breath and then syncopized.  No head injury or other injury.  She is back to baseline now.  She states she felt like she was having a panic attack.  No personal cardiac history.  She denies fever, chills, cough, CP, SOB, abdominal pain, N/V/D, or urinary symptoms.  No new changes in her chronic back pain.  No numbness or weakness.  She reports similar episode at Thanksgiving when she was straining to have a BM.  No new medications aside from the increase in lisinopril at Thanksgiving.  She states she has been having difficulty with her pain doctor and has not been able to have her usual nerve block to help with her pain.  She is very tearful and becomes visibly upset when discussing trying to manage her chronic pain.  She then states she "thinks everyone would be better off if I wasn't here".  No definitive plan.  No HI.  Past Medical History:  Diagnosis Date  . Allergy   . Chicken pox   . Depression   . Hypertension   . Migraines     Patient Active Problem List   Diagnosis Date Noted  . Syncope 09/12/2016  . 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 Anxiety 01/13/2016  . Chronic pain 11/07/2015  . Essential hypertension 11/07/2015  . Insomnia 11/07/2015  . Nicotine dependence 11/07/2015  . Depression 11/07/2015  . Facet syndrome, lumbar 03/11/2015  . Lumbar  radiculopathy 03/11/2015  . Bilateral occipital neuralgia 02/07/2015  . 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 DDD (degenerative disc disease), lumbosacral 01/28/2015  . 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 CERVICAL DISC SURGERY    . TUBAL LIGATION      OB History    No data available       Home Medications    Prior to Admission medications   Medication Sig Start Date End Date Taking? Authorizing Provider  FLUoxetine (PROZAC) 20 MG capsule Take 2 capsules (40 mg total) by mouth daily. 01/12/16  Yes Tommie Sams, DO  HYDROcodone-acetaminophen (NORCO/VICODIN) 5-325 MG tablet Limit 1-3 tablets po daily if tolerated for breakthrough pain while taking tramadol 05/24/16  Yes Ewing Schlein, MD  lidocaine (LIDODERM) 5 % Place 1 patch onto the skin daily. Apply 1-3 patches to skin  for 12 hours then Remove patches for 12 hours. Repeat process if tolerated 03/11/15  Yes Ewing Schlein, MD  lisinopril (PRINIVIL,ZESTRIL) 20 MG tablet Take 1 tablet (20 mg total) by mouth daily. 08/21/16  Yes Tommie Sams, DO  lisinopril-hydrochlorothiazide (PRINZIDE,ZESTORETIC) 20-25 MG tablet Take 1 tablet by mouth daily. 11/07/15  Yes Tommie Sams, DO  orphenadrine (NORFLEX) 100 MG tablet Limit  1 tab po / day or bid if tolerated 05/01/16  Yes Ewing Schlein, MD  potassium chloride  SA (K-DUR,KLOR-CON) 20 MEQ tablet 1 tablet twice daily for 2 days. Then 1 tablet daily. Patient taking differently: Take 20 mEq by mouth daily.  08/29/16  Yes Tommie SamsJayce G Cook, DO  tiZANidine (ZANAFLEX) 2 MG tablet Limit 1 tablet by mouth per day or 2 tablets by mouth per day if tolerated 05/01/16  Yes Ewing SchleinGregory Crisp, MD  traMADol Janean Sark(ULTRAM) 50 MG tablet Limit 2-4 tablets po daily if tolerated 05/24/16  Yes Ewing SchleinGregory Crisp, MD  zolpidem (AMBIEN) 10 MG tablet Take 1 tablet (10 mg total) by mouth at bedtime as needed for sleep. 08/21/16  Yes Jayce G Cook, DO  busPIRone (BUSPAR) 7.5 MG tablet Take 1  tablet (7.5 mg total) by mouth 2 (two) times daily. Patient not taking: Reported on 09/12/2016 01/12/16   Tommie SamsJayce G Cook, DO  lactulose (CHRONULAC) 10 GM/15ML solution Take 30 mLs (20 g total) by mouth daily as needed for mild constipation. Patient not taking: Reported on 09/12/2016 08/18/16   Irean HongJade J Sung, MD  meloxicam [facility](MOBIC) 7.5 MG tablet Limit one tablet by mouth per day if tolerated Patient not taking: Reported on 09/12/2016 05/01/16   Ewing SchleinGregory Crisp, MD    Family History Family History  Problem Relation Age of Onset  . Hypertension Mother   . Arthritis Mother   . Hyperlipidemia Mother   . Mental illness Mother   . Hypertension Maternal Grandmother   . Heart disease Maternal Grandmother     Social History Social History  Substance Use Topics  . Smoking status: Current Every Day Smoker    Packs/day: 0.75    Years: 30.00    Types: Cigarettes  . Smokeless tobacco: Never Used  . Alcohol use No     Allergies   Pregabalin and Codeine   Review of Systems Review of Systems All other systems negative unless otherwise stated in HPI   Physical Exam Updated Vital Signs BP 158/88 (BP Location: Right Arm)   Pulse 99   Temp 98.4 F (36.9 C) (Oral)   Resp 14   LMP  (Within Years)   SpO2 99%   Physical Exam  Constitutional: She is oriented to person, place, and time. She appears well-developed and well-nourished.  Non-toxic appearance. She does not have a sickly appearance. She does not appear ill.  HENT:  Head: Normocephalic and atraumatic.  Mouth/Throat: Oropharynx is clear and moist.  Eyes: Conjunctivae are normal. Pupils are equal, round, and reactive to light.  Neck: Normal range of motion. Neck supple.  Cardiovascular: Regular rhythm and normal heart sounds.  Tachycardia present.  Exam reveals no gallop and no friction rub.   No murmur heard. Pulmonary/Chest: Effort normal and breath sounds normal. No accessory muscle usage or stridor. No respiratory distress. She has  no wheezes. She has no rhonchi. She has no rales.  Abdominal: Soft. Bowel sounds are normal. She exhibits no distension. There is no tenderness.  Musculoskeletal: Normal range of motion.  Lymphadenopathy:    She has no cervical adenopathy.  Neurological: She is alert and oriented to person, place, and time.  Mental Status:   AOx3.  Speech clear without dysarthria. Cranial Nerves:  I-not tested  II-PERRLA  III, IV, VI-EOMs intact  V-temporal and masseter strength intact  VII-symmetrical facial movements intact, no facial droop  VIII-hearing grossly intact bilaterally  IX, X-gag intact  XI-strength of sternomastoid and trapezius muscles 5/5  XII-tongue midline Motor:   Good muscle bulk and tone  Strength 5/5 bilaterally in upper and lower extremities   Cerebellar--intact  RAMs, finger to nose intact bilaterally.    No pronator drift Sensory:  Intact in upper and lower extremities   Skin: Skin is warm and dry.  Psychiatric: She has a normal mood and affect. Her behavior is normal.     ED Treatments / Results  Labs (all labs ordered are listed, but only abnormal results are displayed) Labs Reviewed  BASIC METABOLIC PANEL - Abnormal; Notable for the following:       Result Value   Potassium 2.9 (*)    Glucose, Bld 139 (*)    All other components within normal limits  RAPID URINE DRUG SCREEN, HOSP PERFORMED - Abnormal; Notable for the following:    Opiates POSITIVE (*)    All other components within normal limits  ACETAMINOPHEN LEVEL - Abnormal; Notable for the following:    Acetaminophen (Tylenol), Serum <10 (*)    All other components within normal limits  URINALYSIS, ROUTINE W REFLEX MICROSCOPIC - Abnormal; Notable for the following:    APPearance HAZY (*)    Hgb urine dipstick SMALL (*)    Leukocytes, UA SMALL (*)    Bacteria, UA RARE (*)    Squamous Epithelial / LPF 6-30 (*)    All other components within normal limits  CBG MONITORING, ED - Abnormal; Notable for the  following:    Glucose-Capillary 159 (*)    All other components within normal limits  CBC WITH DIFFERENTIAL/PLATELET  SALICYLATE LEVEL  I-STAT TROPOININ, ED  I-STAT BETA HCG BLOOD, ED ([facility], [facility], [facility] ONLY)    EKG  EKG Interpretation  Date/Time:  Tuesday September 12 2016 10:36:05 EST Ventricular Rate:  107 PR Interval:    QRS Duration: 87 QT Interval:  347 QTC Calculation: 463 R Axis:   81 Text Interpretation:  Sinus tachycardia LAE, consider biatrial enlargement Repol abnrm suggests ischemia, diffuse leads Baseline wander in all  lead(s) Poor data quality in current ECG precludes serial comparison Confirmed by [facility] HospitalCARDAMA MD, PEDRO ([id number]) on 09/12/2016 11:04:49 AM       Radiology Dg Chest 2 View  Result Date: 09/12/2016 CLINICAL DATA:  Shortness of breath, hypertension. EXAM: CHEST  2 VIEW COMPARISON:  Radiographs of December 16, 2005. FINDINGS: The heart size and mediastinal contours are within normal limits. Both lungs are clear. No pneumothorax or pleural effusion is noted. The visualized skeletal structures are unremarkable. IMPRESSION: No active cardiopulmonary disease. Electronically Signed   By: Lupita RaiderJames  Green Jr, M.D.   On: 09/12/2016 11:50    Procedures Procedures (including critical care time)  Medications Ordered in ED Medications  potassium chloride 30 mEq in sodium chloride 0.9 % 265 mL (KCL MULTIRUN) IVPB (not administered)  diazepam (VALIUM) tablet 5 mg (5 mg Oral Given 09/12/16 1149)  potassium chloride SA (K-DUR,KLOR-CON) CR tablet 40 mEq (40 mEq Oral Given 09/12/16 1217)     Initial Impression / Assessment and Plan / ED Course  I have reviewed the triage vital signs and the nursing notes.  Pertinent labs & imaging results that were available during my care of the patient were reviewed by me and considered in my medical decision making (see chart for details).  Clinical Course    Patient presents with high risk syncope.  Patient initially came to ED for concern  about elevated BP, BP today is hypertensive, but no evidence of urgency/emergency.  When in triage, she syncopized.  She experienced palpitations, dyspnea, became hot/flushed and lost consciousness for a couple of seconds. No chest pain.  No cardiac history.  EKG showed U waves and tachycardia.  Potassium 2.9.  Remaining labs without acute abnormalities.  Patient given PO as well as IV potassium.  Patient will need admission for cardiac monitoring and psych evaluation due to suicidal ideations expressed by the patient.  Case has been discussed with Dr. Eudelia Bunch who agrees with the above plan for admission.    Final Clinical Impressions(s) / ED Diagnoses   Final diagnoses:  Syncope, unspecified syncope type  Hypokalemia  Other chronic pain  Essential hypertension  Palpitations  Passive suicidal ideations    New Prescriptions New Prescriptions   No medications on file     Cheri Fowler, Cordelia Poche 09/12/16 1312    Nira Conn, MD 09/12/16 1728

## 2016-09-12 NOTE — ED Notes (Signed)
Pt belongings at nurse station, pt has been wanded by security.

## 2016-09-12 NOTE — ED Notes (Signed)
Husband states he will take all of patients belongings home for her.

## 2016-09-12 NOTE — ED Notes (Signed)
Pt returned to room from x-ray w/ sitter.

## 2016-09-12 NOTE — H&P (Signed)
History and Physical    Ladell Piermy D Weissmann ZOX:[id number] DOB: September 21, 1966 DOA: 09/12/2016  PCP: Tommie SamsJayce G Cook, DO Patient coming from: home  Chief Complaint: back pain, HTN, and syncope  HPI: Crystal Roberson is a 50 y.o. female with medical history significant of severe depression, hypertension, migraines, chronic back pain. Patient presented to the emergency room on 09/12/2016 for treatment of her blood pressure. Patient reports that her blood pressure is very labile at baseline with systolic pressures ranging from the 90s to 150s. Patient was seen at her pain clinic one day ago where her blood pressure is noted to be above 200 systolic. Patient states that this is due to difficulty with her pain management doctor and getting proper treatment. Patient is felt very frustrated over the last day due to this problem. Patient did not come into the emergency room after her blood pressure reading was noted at the pain management clinic because she states that she was too upset to come to the emergency room. Patient reports having her lisinopril dose doubled over the Thanksgiving holiday. No other medication changes recently. Patient reports multiple syncopal episodes over the last month. Initial single episode happened after a very large and difficult to pass bowel movement. Second syncopal episode happened while ambulating through her kitchen. At time of admission patient states she was standing at emergency room triage counter when she began to feel flushed, hot and have palpitations. Began to feel the room was becoming darker around here. Patient passed out and fell to the floor. No head trauma. Mother's room staff state that patient lost all color in her face just prior to episode. Of note patient also endorses thoughts of life being better without her but denies any actual suicidal thoughts or endorsing any planned to kill herself or hurt anyone else. Patient denies any chest pain, shortness of breath, dyspnea on  exertion, neck stiffness, vertigo, abdominal pain, nausea, vomiting, diarrhea, back pain, rash, fever.  ED Course: Valium w/ improvement in anxiety and bp. Started on KCL for hypoK. Sitter ordered at bedside  Review of Systems: As per HPI otherwise 10 point review of systems negative.   Ambulatory Status:no restrictions  Past Medical History:  Diagnosis Date  . Allergy   . Chicken pox   . Chronic back pain   . Depression   . Hypertension   . Migraines   . Syncope 09/12/2016    Past Surgical History:  Procedure Laterality Date  . CERVICAL DISC SURGERY    . TUBAL LIGATION      Social History   Social History  . Marital status: Divorced    Spouse name: N/A  . Number of children: N/A  . Years of education: N/A   Occupational History  . Not on file.   Social History Main Topics  . Smoking status: Current Every Day Smoker    Packs/day: 0.75    Years: 30.00    Types: Cigarettes  . Smokeless tobacco: Never Used  . Alcohol use No  . Drug use: No  . Sexual activity: Not Currently   Other Topics Concern  . Not on file   Social History Narrative  . No narrative on file    Allergies  Allergen Reactions  . Pregabalin Shortness Of Breath and Swelling    Other reaction(s): Tachycardia (finding)  . Codeine     Other reaction(s): Unknown .    Family History  Problem Relation Age of Onset  . Hypertension Mother   . Arthritis Mother   .  Hyperlipidemia Mother   . Mental illness Mother   . Hypertension Maternal Grandmother   . Heart disease Maternal Grandmother     Prior to Admission medications   Medication Sig Start Date End Date Taking? Authorizing Provider  FLUoxetine (PROZAC) 20 MG capsule Take 2 capsules (40 mg total) by mouth daily. 01/12/16  Yes Tommie Sams, DO  HYDROcodone-acetaminophen (NORCO/VICODIN) 5-325 MG tablet Limit 1-3 tablets po daily if tolerated for breakthrough pain while taking tramadol 05/24/16  Yes Ewing Schlein, MD  lidocaine (LIDODERM) 5  % Place 1 patch onto the skin daily. Apply 1-3 patches to skin  for 12 hours then Remove patches for 12 hours. Repeat process if tolerated 03/11/15  Yes Ewing Schlein, MD  lisinopril (PRINIVIL,ZESTRIL) 20 MG tablet Take 1 tablet (20 mg total) by mouth daily. 08/21/16  Yes Tommie Sams, DO  lisinopril-hydrochlorothiazide (PRINZIDE,ZESTORETIC) 20-25 MG tablet Take 1 tablet by mouth daily. 11/07/15  Yes Tommie Sams, DO  orphenadrine (NORFLEX) 100 MG tablet Limit  1 tab po / day or bid if tolerated 05/01/16  Yes Ewing Schlein, MD  potassium chloride SA (K-DUR,KLOR-CON) 20 MEQ tablet 1 tablet twice daily for 2 days. Then 1 tablet daily. Patient taking differently: Take 20 mEq by mouth daily.  08/29/16  Yes Tommie Sams, DO  tiZANidine (ZANAFLEX) 2 MG tablet Limit 1 tablet by mouth per day or 2 tablets by mouth per day if tolerated 05/01/16  Yes Ewing Schlein, MD  traMADol Janean Sark) 50 MG tablet Limit 2-4 tablets po daily if tolerated 05/24/16  Yes Ewing Schlein, MD  zolpidem (AMBIEN) 10 MG tablet Take 1 tablet (10 mg total) by mouth at bedtime as needed for sleep. 08/21/16  Yes Jayce G Cook, DO  busPIRone (BUSPAR) 7.5 MG tablet Take 1 tablet (7.5 mg total) by mouth 2 (two) times daily. Patient not taking: Reported on 09/12/2016 01/12/16   Tommie Sams, DO  lactulose (CHRONULAC) 10 GM/15ML solution Take 30 mLs (20 g total) by mouth daily as needed for mild constipation. Patient not taking: Reported on 09/12/2016 08/18/16   Irean Hong, MD  meloxicam (MOBIC) 7.5 MG tablet Limit one tablet by mouth per day if tolerated Patient not taking: Reported on 09/12/2016 05/01/16   Ewing Schlein, MD    Physical Exam: Vitals:   09/12/16 1300 09/12/16 1315 09/12/16 1330 09/12/16 1440  BP: 142/79 151/90 140/91 (!) 156/87  Pulse: 85 83 81 73  Resp: 12 16 12 14   Temp:    98.5 F (36.9 C)  TempSrc:    Oral  SpO2: 95% 98% 96% 98%     General:  Appears calm and comfortable Eyes:  PERRL, EOMI, normal lids, iris ENT:   grossly normal hearing, lips & tongue, mmm Neck:  no LAD, masses or thyromegaly Cardiovascular:  RRR, no m/r/g. No LE edema.  Respiratory:  CTA bilaterally, no w/r/r. Normal respiratory effort. Abdomen:  soft, ntnd, NABS Skin:  no rash or induration seen on limited exam Musculoskeletal:  grossly normal tone BUE/BLE, good ROM, no bony abnormality Psychiatric:  Somewhat subdued affect, pleasant, answers questions appropriately  Neurologic:  CN 2-12 grossly intact, moves all extremities in coordinated fashion, sensation intact  Labs on Admission: I have personally reviewed following labs and imaging studies  CBC:  Recent Labs Lab 09/12/16 1117  WBC 9.3  NEUTROABS 6.2  HGB 15.0  HCT 43.0  MCV 90.1  PLT 197   Basic Metabolic Panel:  Recent Labs Lab 09/12/16 1117  NA  139  K 2.9*  CL 103  CO2 24  GLUCOSE 139*  BUN 8  CREATININE 0.89  CALCIUM 9.4   GFR: CrCl cannot be calculated (Unknown ideal weight.). Liver Function Tests: No results for input(s): AST, ALT, ALKPHOS, BILITOT, PROT, ALBUMIN in the last 168 hours. No results for input(s): LIPASE, AMYLASE in the last 168 hours. No results for input(s): AMMONIA in the last 168 hours. Coagulation Profile: No results for input(s): INR, PROTIME in the last 168 hours. Cardiac Enzymes: No results for input(s): CKTOTAL, CKMB, CKMBINDEX, TROPONINI in the last 168 hours. BNP (last 3 results) No results for input(s): PROBNP in the last 8760 hours. HbA1C: No results for input(s): HGBA1C in the last 72 hours. CBG:  Recent Labs Lab 09/12/16 1036  GLUCAP 159*   Lipid Profile: No results for input(s): CHOL, HDL, LDLCALC, TRIG, CHOLHDL, LDLDIRECT in the last 72 hours. Thyroid Function Tests: No results for input(s): TSH, T4TOTAL, FREET4, T3FREE, THYROIDAB in the last 72 hours. Anemia Panel: No results for input(s): VITAMINB12, FOLATE, FERRITIN, TIBC, IRON, RETICCTPCT in the last 72 hours. Urine analysis:    Component Value  Date/Time   COLORURINE YELLOW 09/12/2016 1150   APPEARANCEUR HAZY (A) 09/12/2016 1150   APPEARANCEUR Hazy 01/04/2012 1748   LABSPEC 1.009 09/12/2016 1150   LABSPEC 1.006 01/04/2012 1748   PHURINE 6.0 09/12/2016 1150   GLUCOSEU NEGATIVE 09/12/2016 1150   GLUCOSEU Negative 01/04/2012 1748   HGBUR SMALL (A) 09/12/2016 1150   BILIRUBINUR NEGATIVE 09/12/2016 1150   BILIRUBINUR Negative 01/04/2012 1748   KETONESUR NEGATIVE 09/12/2016 1150   PROTEINUR NEGATIVE 09/12/2016 1150   NITRITE NEGATIVE 09/12/2016 1150   LEUKOCYTESUR SMALL (A) 09/12/2016 1150   LEUKOCYTESUR Negative 01/04/2012 1748    Creatinine Clearance: CrCl cannot be calculated (Unknown ideal weight.).  Sepsis Labs: @LABRCNTIP (procalcitonin:4,lacticidven:4) )No results found for this or any previous visit (from the past 240 hour(s)).   Radiological Exams on Admission: Dg Chest 2 View  Result Date: 09/12/2016 CLINICAL DATA:  Shortness of breath, hypertension. EXAM: CHEST  2 VIEW COMPARISON:  Radiographs of December 16, 2005. FINDINGS: The heart size and mediastinal contours are within normal limits. Both lungs are clear. No pneumothorax or pleural effusion is noted. The visualized skeletal structures are unremarkable. IMPRESSION: No active cardiopulmonary disease. Electronically Signed   By: Lupita RaiderJames  Green Jr, M.D.   On: 09/12/2016 11:50    EKG: Independently reviewed. Sinus. No ACs. Unchanged from previous  Assessment/Plan Active Problems:   Lumbar radiculopathy   Chronic pain   Essential hypertension   Insomnia   Depression   Syncope   Hypokalemia   Sycnope: Cardiac vs anxiety/stress/psychosomatic vs hypotension/orthostasis. Pt endorsing palpitations just prior to episode. No h/o arrythmia. EKG sinus. CXR unremarkable. K 2.9 but doubt contributory given h/o similar metabolic state - Tele - orthostatic VS - See psych below - Echo  Psych: endorses casual thoughts of "life would be better without me." thoughts  worsened when in pain. Pt has not had access to typical pain mgt resulting in increased stress and depression. No recent medication changes. Denies any thoughts or plan on how to kill self. Denies HI. No current thoughts of suicide.  - Psych consult - Sitter if condition worsens - nursing staff aware. - continue Prozac  HTN: labile per pt regardless of daily vairables. SBP range from 90-150 at baseline - continue lisinopril - Hydralazine PRN SBP >160  HypoK: 2.9 on admission. 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 Frustrated lately over multiple location changes by pain mgt physician and lack of ability to perform back injections - continue nroco, Tramadol, Norflex inj, Zanaflex  DVT prophylaxis: Lovenox  Code Status: full  Family Communication: husband  Disposition Plan: pending improvement and workup for syncope  Consults called: psych  Admission status: obs - tele    Madisson Kulaga J MD [facility]  If 7PM-7AM, please contact night-coverage [email or web address] Password [email or web address]  09/12/2016, 2:53 PM

## 2016-09-13 ENCOUNTER — Observation Stay (HOSPITAL_BASED_OUTPATIENT_CLINIC_OR_DEPARTMENT_OTHER): Payer: Self-pay

## 2016-09-13 DIAGNOSIS — G8929 Other chronic pain: Secondary | ICD-10-CM

## 2016-09-13 DIAGNOSIS — R55 Syncope and collapse: Secondary | ICD-10-CM

## 2016-09-13 DIAGNOSIS — I951 Orthostatic hypotension: Principal | ICD-10-CM

## 2016-09-13 LAB — BASIC METABOLIC PANEL
Anion gap: 6 (ref 5–15)
BUN: 11 mg/dL (ref 6–20)
CALCIUM: 8.9 mg/dL (ref 8.9–10.3)
CO2: 25 mmol/L (ref 22–32)
CREATININE: 0.73 mg/dL (ref 0.44–1.00)
Chloride: 108 mmol/L (ref 101–111)
GFR calc Af Amer: >60 mL/min (ref >60)
GLUCOSE: 94 mg/dL (ref 65–99)
POTASSIUM: 4.4 mmol/L (ref 3.5–5.1)
SODIUM: 139 mmol/L (ref 135–145)

## 2016-09-13 LAB — TSH: TSH: 1.804 u[IU]/mL (ref 0.350–4.500)

## 2016-09-13 LAB — ECHOCARDIOGRAM COMPLETE: WEIGHTICAEL: 2200 [oz_av]

## 2016-09-13 LAB — CBC
HCT: 40 % (ref 36.0–46.0)
Hemoglobin: 13.4 g/dL (ref 12.0–15.0)
MCH: 30.7 pg (ref 26.0–34.0)
MCHC: 33.5 g/dL (ref 30.0–36.0)
MCV: 91.5 fL (ref 78.0–100.0)
PLATELETS: 178 10*3/uL (ref 150–400)
RBC: 4.37 MIL/uL (ref 3.87–5.11)
RDW: 12.3 % (ref 11.5–15.5)
WBC: 6.4 10*3/uL (ref 4.0–10.5)

## 2016-09-13 LAB — CORTISOL: Cortisol, Plasma: 1.9 ug/dL

## 2016-09-13 LAB — GLUCOSE, CAPILLARY: GLUCOSE-CAPILLARY: 83 mg/dL (ref 65–99)

## 2016-09-13 MED ORDER — BUPROPION HCL ER (XL) 150 MG PO TB24
150.0000 mg | ORAL_TABLET | Freq: Every day | ORAL | Status: DC
  Filled 2016-09-13 (×2): qty 1

## 2016-09-13 MED ORDER — SODIUM CHLORIDE 0.9 % IV SOLN
INTRAVENOUS | Status: DC
  Administered 2016-09-13 – 2016-09-14 (×2): via INTRAVENOUS

## 2016-09-13 NOTE — Progress Notes (Addendum)
PROGRESS NOTE    Crystal Roberson  [id number] DOB: 06/05/1966 DOA: 09/12/2016 PCP: Tommie SamsJayce G Cook, DO   Outpatient Specialists:     Brief Narrative:  Crystal Roberson is a 50 y.o. female with medical history significant of severe depression, hypertension, migraines, chronic back pain. Patient presented to the emergency room on 09/12/2016 for treatment of her blood pressure. Patient reports that her blood pressure is very labile at baseline with systolic pressures ranging from the 90s to 150s. Patient was seen at her pain clinic one day ago where her blood pressure is noted to be above 200 systolic. Patient states that this is due to difficulty with her pain management doctor and getting proper treatment. Patient is felt very frustrated over the last day due to this problem. Patient did not come into the emergency room after her blood pressure reading was noted at the pain management clinic because she states that she was too upset to come to the emergency room. Patient reports having her lisinopril dose doubled over the Thanksgiving holiday. No other medication changes recently. Patient reports multiple syncopal episodes over the last month. Initial single episode happened after a very large and difficult to pass bowel movement. Second syncopal episode happened while ambulating through her kitchen. At time of admission patient states she was standing at emergency room triage counter when she began to feel flushed, hot and have palpitations. Began to feel the room was becoming darker around here. Patient passed out and fell to the floor. No head trauma. Mother's room staff state that patient lost all color in her face just prior to episode. Of note patient also endorses thoughts of life being better without her but denies any actual suicidal thoughts or endorsing any planned to kill herself or hurt anyone else. Patient denies any chest pain, shortness of breath, dyspnea on exertion, neck stiffness, vertigo,  abdominal pain, nausea, vomiting, diarrhea, back pain, rash, fever.   Assessment & Plan:   Active Problems:   Lumbar radiculopathy   Chronic pain   Essential hypertension   Insomnia   Depression   Syncope   Hypokalemia   Palpitations   Passive suicidal ideations   Syncope  -orthostatics positive- IVF -story also consistent with orthostatics -echo done: Trivial mitral and tricuspid regurgitation, otherwise normal echocardiogram -TSH, cortisol Tele undreavealing  Psych: no active suicidal ideations-- patient says she was just frustrated with pain management situation - discussed with psych- check TSH, add wellbutrin 150 mg XL -given patient suicide hotline #: 1800 [phone] in case of worsening thoughts - continue Prozac  HTN: labile per pt regardless of daily vairables. SBP range from 90-150 at baseline - continue lisinopril  HypoK: 2.9 on admission. Typically runs low normal - Mag pending  Insomnia: - continue ambien  Constipation: due to chornic opioid use - continue   Chronic pain: seen in pain mgt clinic. Frustrated lately over multiple location changes by pain mgt physician and lack of ability to perform back injections - continue nroco, Tramadol, Norflex inj, Zanaflex   DVT prophylaxis:  Lovenox   Code Status: Full Code   Family Communication:   Disposition Plan:        Subjective: No suicidal ideations Upset with current pain management doctor  Objective: Vitals:   09/13/16 0411 09/13/16 0845 09/13/16 1037 09/13/16 1140  BP: (!) 148/75 (!) 142/79 (!) 159/78 (!) 168/92  Pulse: 77 73  73  Resp: 16 17  16   Temp: 97.5 F (36.4 C) 98.1 F (36.7 C)  98.2 F (36.8 C)  TempSrc: Oral Oral  Oral  SpO2: 99% 96%  99%  Weight: 62.4 kg (137 lb 8 oz)       Intake/Output Summary (Last 24 hours) at 09/13/16 1352 Last data filed at 09/13/16 1312  Gross per 24 hour  Intake             1020 ml  Output                0 ml  Net             1020  ml   Filed Weights   09/13/16 0411  Weight: 62.4 kg (137 lb 8 oz)    Examination:  General exam: Appears calm and comfortable  Respiratory system: Clear to auscultation. Respiratory effort normal. Cardiovascular system: S1 & S2 heard, RRR. No JVD, murmurs, rubs, gallops or clicks. No pedal edema. Gastrointestinal system: Abdomen is nondistended, soft and nontender. No organomegaly or masses felt. Normal bowel sounds heard. Central nervous system: Alert and oriented. No focal neurological deficits. .     Data Reviewed: I have personally reviewed following labs and imaging studies  CBC:  Recent Labs Lab 09/12/16 1117 09/13/16 0338  WBC 9.3 6.4  NEUTROABS 6.2  --   HGB 15.0 13.4  HCT 43.0 40.0  MCV 90.1 91.5  PLT 197 178   Basic Metabolic Panel:  Recent Labs Lab 09/12/16 1117 09/13/16 0338  NA 139 139  K 2.9* 4.4  CL 103 108  CO2 24 25  GLUCOSE 139* 94  BUN 8 11  CREATININE 0.89 0.73  CALCIUM 9.4 8.9   GFR: Estimated Creatinine Clearance: 74 mL/min (by C-G formula based on SCr of 0.73 mg/dL). Liver Function Tests: No results for input(s): AST, ALT, ALKPHOS, BILITOT, PROT, ALBUMIN in the last 168 hours. No results for input(s): LIPASE, AMYLASE in the last 168 hours. No results for input(s): AMMONIA in the last 168 hours. Coagulation Profile: No results for input(s): INR, PROTIME in the last 168 hours. Cardiac Enzymes: No results for input(s): CKTOTAL, CKMB, CKMBINDEX, TROPONINI in the last 168 hours. BNP (last 3 results) No results for input(s): PROBNP in the last 8760 hours. HbA1C: No results for input(s): HGBA1C in the last 72 hours. CBG:  Recent Labs Lab 09/12/16 1036 09/13/16 0748  GLUCAP 159* 83   Lipid Profile: No results for input(s): CHOL, HDL, LDLCALC, TRIG, CHOLHDL, LDLDIRECT in the last 72 hours. Thyroid Function Tests: No results for input(s): TSH, T4TOTAL, FREET4, T3FREE, THYROIDAB in the last 72 hours. Anemia Panel: No results for  input(s): VITAMINB12, FOLATE, FERRITIN, TIBC, IRON, RETICCTPCT in the last 72 hours. Urine analysis:    Component Value Date/Time   COLORURINE YELLOW 09/12/2016 1150   APPEARANCEUR HAZY (A) 09/12/2016 1150   APPEARANCEUR Hazy 01/04/2012 1748   LABSPEC 1.009 09/12/2016 1150   LABSPEC 1.006 01/04/2012 1748   PHURINE 6.0 09/12/2016 1150   GLUCOSEU NEGATIVE 09/12/2016 1150   GLUCOSEU Negative 01/04/2012 1748   HGBUR SMALL (A) 09/12/2016 1150   BILIRUBINUR NEGATIVE 09/12/2016 1150   BILIRUBINUR Negative 01/04/2012 1748   KETONESUR NEGATIVE 09/12/2016 1150   PROTEINUR NEGATIVE 09/12/2016 1150   NITRITE NEGATIVE 09/12/2016 1150   LEUKOCYTESUR SMALL (A) 09/12/2016 1150   LEUKOCYTESUR Negative 01/04/2012 1748     )No results found for this or any previous visit (from the past 240 hour(s)).    Anti-infectives    None       Radiology Studies: Dg Chest 2 View  Result Date: 09/12/2016 CLINICAL DATA:  Shortness of breath, hypertension. EXAM: CHEST  2 VIEW COMPARISON:  Radiographs of December 16, 2005. FINDINGS: The heart size and mediastinal contours are within normal limits. Both lungs are clear. No pneumothorax or pleural effusion is noted. The visualized skeletal structures are unremarkable. IMPRESSION: No active cardiopulmonary disease. Electronically Signed   By: Lupita RaiderJames  Green Jr, M.D.   On: 09/12/2016 11:50        Scheduled Meds: . enoxaparin (LOVENOX) injection  40 mg Subcutaneous Q24H  . FLUoxetine  40 mg Oral Daily  . lisinopril  20 mg Oral Daily  . ondansetron  8 mg Oral Once  . potassium chloride  40 mEq Oral BID  . sodium chloride flush  3 mL Intravenous Q12H  . sodium chloride flush  3 mL Intravenous Q12H  . sodium chloride flush  3 mL Intravenous Q12H  . tiZANidine  4 mg Oral BID  . traMADol  50 mg Oral Q6H   Continuous Infusions: . sodium chloride       LOS: 0 days    Time spent: 25 min    Noely Kuhnle U Floye Fesler, DO [facility] Pager [phone]  If  7PM-7AM, please contact night-coverage [email or web address] Password [email or web address] 09/13/2016, 1:52 PM

## 2016-09-13 NOTE — Progress Notes (Signed)
  Echocardiogram 2D Echocardiogram has been performed.  Crystal Roberson, Crystal Roberson 09/13/2016, 9:22 AM

## 2016-09-14 DIAGNOSIS — Z9119 Patient's noncompliance with other medical treatment and regimen: Secondary | ICD-10-CM

## 2016-09-14 DIAGNOSIS — Z91199 Patient's noncompliance with other medical treatment and regimen due to unspecified reason: Secondary | ICD-10-CM

## 2016-09-14 LAB — BASIC METABOLIC PANEL
Anion gap: 5 (ref 5–15)
BUN: 8 mg/dL (ref 6–20)
CHLORIDE: 107 mmol/L (ref 101–111)
CO2: 27 mmol/L (ref 22–32)
Calcium: 8.9 mg/dL (ref 8.9–10.3)
Creatinine, Ser: 0.69 mg/dL (ref 0.44–1.00)
GFR calc Af Amer: >60 mL/min (ref >60)
GLUCOSE: 91 mg/dL (ref 65–99)
POTASSIUM: 4.5 mmol/L (ref 3.5–5.1)
Sodium: 139 mmol/L (ref 135–145)

## 2016-09-14 LAB — CBC
HEMATOCRIT: 40.1 % (ref 36.0–46.0)
HEMOGLOBIN: 13.2 g/dL (ref 12.0–15.0)
MCH: 30.6 pg (ref 26.0–34.0)
MCHC: 32.9 g/dL (ref 30.0–36.0)
MCV: 92.8 fL (ref 78.0–100.0)
PLATELETS: 155 10*3/uL (ref 150–400)
RBC: 4.32 MIL/uL (ref 3.87–5.11)
RDW: 12.4 % (ref 11.5–15.5)
WBC: 5.8 10*3/uL (ref 4.0–10.5)

## 2016-09-14 LAB — URINALYSIS, ROUTINE W REFLEX MICROSCOPIC
Bacteria, UA: NONE SEEN
Bilirubin Urine: NEGATIVE
GLUCOSE, UA: NEGATIVE mg/dL
Ketones, ur: NEGATIVE mg/dL
Nitrite: NEGATIVE
PH: 6 (ref 5.0–8.0)
Protein, ur: NEGATIVE mg/dL
Specific Gravity, Urine: 1.008 (ref 1.005–1.030)

## 2016-09-14 LAB — GLUCOSE, CAPILLARY: GLUCOSE-CAPILLARY: 109 mg/dL — AB (ref 65–99)

## 2016-09-14 LAB — MAGNESIUM: MAGNESIUM: 1.9 mg/dL (ref 1.7–2.4)

## 2016-09-14 LAB — PHOSPHORUS: Phosphorus: 2.8 mg/dL (ref 2.5–4.6)

## 2016-09-14 LAB — CORTISOL-AM, BLOOD: CORTISOL - AM: 11 ug/dL (ref 6.7–22.6)

## 2016-09-14 MED ORDER — BUPROPION HCL ER (XL) 150 MG PO TB24: 150.0000 mg | ORAL_TABLET | Freq: Every day | ORAL | 0 refills | Status: DC

## 2016-09-14 NOTE — Discharge Instructions (Signed)
Hypertension Hypertension, commonly called high blood pressure, is when the force of blood pumping through your arteries is too strong. Your arteries are the blood vessels that carry blood from your heart throughout your body. A blood pressure reading consists of a higher number over a lower number, such as 110/72. The higher number (systolic) is the pressure inside your arteries when your heart pumps. The lower number (diastolic) is the pressure inside your arteries when your heart relaxes. Ideally you want your blood pressure below 120/80. Hypertension forces your heart to work harder to pump blood. Your arteries may become narrow or stiff. Having untreated or uncontrolled hypertension can cause heart attack, stroke, kidney disease, and other problems. What increases the risk? Some risk factors for high blood pressure are controllable. Others are not. Risk factors you cannot control include:  Race. You may be at higher risk if you are African American.  Age. Risk increases with age.  Gender. Men are at higher risk than women before age 45 years. After age 65, women are at higher risk than men. Risk factors you can control include:  Not getting enough exercise or physical activity.  Being overweight.  Getting too much fat, sugar, calories, or salt in your diet.  Drinking too much alcohol. What are the signs or symptoms? Hypertension does not usually cause signs or symptoms. Extremely high blood pressure (hypertensive crisis) may cause headache, anxiety, shortness of breath, and nosebleed. How is this diagnosed? To check if you have hypertension, your health care provider will measure your blood pressure while you are seated, with your arm held at the level of your heart. It should be measured at least twice using the same arm. Certain conditions can cause a difference in blood pressure between your right and left arms. A blood pressure reading that is higher than normal on one occasion does  not mean that you need treatment. If it is not clear whether you have high blood pressure, you may be asked to return on a different day to have your blood pressure checked again. Or, you may be asked to monitor your blood pressure at home for 1 or more weeks. How is this treated? Treating high blood pressure includes making lifestyle changes and possibly taking medicine. Living a healthy lifestyle can help lower high blood pressure. You may need to change some of your habits. Lifestyle changes may include:  Following the DASH diet. This diet is high in fruits, vegetables, and whole grains. It is low in salt, red meat, and added sugars.  Keep your sodium intake below 2,300 mg per day.  Getting at least 30-45 minutes of aerobic exercise at least 4 times per week.  Losing weight if necessary.  Not smoking.  Limiting alcoholic beverages.  Learning ways to reduce stress. Your health care provider may prescribe medicine if lifestyle changes are not enough to get your blood pressure under control, and if one of the following is true:  You are 18-59 years of age and your systolic blood pressure is above 140.  You are 60 years of age or older, and your systolic blood pressure is above 150.  Your diastolic blood pressure is above 90.  You have diabetes, and your systolic blood pressure is over 140 or your diastolic blood pressure is over 90.  You have kidney disease and your blood pressure is above 140/90.  You have heart disease and your blood pressure is above 140/90. Your personal target blood pressure may vary depending on your medical   conditions, your age, and other factors. Follow these instructions at home:  Have your blood pressure rechecked as directed by your health care provider.  Take medicines only as directed by your health care provider. Follow the directions carefully. Blood pressure medicines must be taken as prescribed. The medicine does not work as well when you skip  doses. Skipping doses also puts you at risk for problems.  Do not smoke.  Monitor your blood pressure at home as directed by your health care provider. Contact a health care provider if:  You think you are having a reaction to medicines taken.  You have recurrent headaches or feel dizzy.  You have swelling in your ankles.  You have trouble with your vision. Get help right away if:  You develop a severe headache or confusion.  You have unusual weakness, numbness, or feel faint.  You have severe chest or abdominal pain.  You vomit repeatedly.  You have trouble breathing. This information is not intended to replace advice given to you by your health care provider. Make sure you discuss any questions you have with your health care provider. Document Released: 09/11/2005 Document Revised: 02/17/2016 Document Reviewed: 07/04/2013 Elsevier Interactive Patient Education  2017 [employer]vier Inc.  

## 2016-09-14 NOTE — Progress Notes (Signed)
Discharge instructions reviewed with patient. Patient has no questions at this time. Prescription given to patient. Pt educated and stated she will try to schedule a follow up appointment with her PCP.

## 2016-09-14 NOTE — Discharge Summary (Signed)
Physician Discharge Summary  Crystal Roberson [id number] DOB: [birth year] DOA: 09/12/2016  PCP: Tommie SamsJayce G Cook, DO  Admit date: 09/12/2016 Discharge date: 09/14/2016  Admitted From: home Disposition:  Home  Recommendations for Outpatient Follow-up:  1. Follow up with Cardiology in 1-2 weeks 2. Please obtain BMP/CBC in one week. 3. We will have to discuss compliance issue.   Home Health:no Equipment/Devices:none  Discharge Condition:stable CODE STATUS:full Diet recommendation: Heart Healthy  Brief/Interim Summary: 50 year old with past medical history of severe depression essential hypertension chronic back pain presents to the emergency room 09/02/2016 for an elevated blood pressure, she relates her blood pressure has been labile at home her systolic ranging from 90s to 150s she was seen in the pain clinic 1 day prior to admission when her blood pressure was noted to be greater than 200. She was referred to the ED but did not comment that time, in the ED she had a syncopal episode. Orthostatics were checked which are positive.  Discharge Diagnoses:  Active Problems:   Lumbar radiculopathy   Chronic pain   Essential hypertension   Insomnia   Depression   Syncope   Hypokalemia   Palpitations   Passive suicidal ideations   Orthostatic hypotension   Noncompliance  Syncope likely due to orthostatic hypotension: Her steroids. Classical for orthostatic hypotension, orthostatics were checked on admission which were positive, she was given aggressive IV fluid hydration, orthostatics were rechecked which were resolved. She had been taking double the dose of her lisinopril, and this was DC'd. She will go home on lisinopril and hydrocodone thiazide. TSH was 1.8 the morning is 11.  Depression and anxiety: It was discussed with psychiatry they recommended to Wellbutrin XL and continue Prozac.  Noncompliance Essential hypertension: I am not sure how reliable she is taking her  medication, she probably has a component of noncompliance. She was just placed in the hospital lisinopril and her blood pressure highest it was 150. She will go home on lisinopril and hydrochlorothiazide.  Discharge Instructions  Discharge Instructions    Diet - low sodium heart healthy    Complete by:  As directed    Increase activity slowly    Complete by:  As directed      Allergies as of 09/14/2016      Reactions   Pregabalin Shortness Of Breath, Swelling   Other reaction(s): Tachycardia (finding)   Codeine    Other reaction(s): Unknown .      Medication List    STOP taking these medications   busPIRone 7.5 MG tablet Commonly known as:  BUSPAR   lactulose 10 GM/15ML solution Commonly known as:  CHRONULAC   lisinopril 20 MG tablet Commonly known as:  PRINIVIL,ZESTRIL   meloxicam 7.5 MG tablet Commonly known as:  MOBIC   orphenadrine 100 MG tablet Commonly known as:  NORFLEX     TAKE these medications   buPROPion 150 MG 24 hr tablet Commonly known as:  WELLBUTRIN XL Take 1 tablet (150 mg total) by mouth daily.   FLUoxetine 20 MG capsule Commonly known as:  PROZAC Take 2 capsules (40 mg total) by mouth daily.   HYDROcodone-acetaminophen 5-325 MG tablet Commonly known as:  NORCO/VICODIN Limit 1-3 tablets po daily if tolerated for breakthrough pain while taking tramadol   lidocaine 5 % Commonly known as:  LIDODERM Place 1 patch onto the skin daily. Apply 1-3 patches to skin  for 12 hours then Remove patches for 12 hours. Repeat process if tolerated   lisinopril-hydrochlorothiazide 20-25 MG  tablet Commonly known as:  PRINZIDE,ZESTORETIC Take 1 tablet by mouth daily.   potassium chloride SA 20 MEQ tablet Commonly known as:  K-DUR,KLOR-CON 1 tablet twice daily for 2 days. Then 1 tablet daily. What changed:  how much to take  how to take this  when to take this  additional instructions   tiZANidine 2 MG tablet Commonly known as:  ZANAFLEX Limit 1  tablet by mouth per day or 2 tablets by mouth per day if tolerated   traMADol 50 MG tablet Commonly known as:  ULTRAM Limit 2-4 tablets po daily if tolerated   zolpidem 10 MG tablet Commonly known as:  AMBIEN Take 1 tablet (10 mg total) by mouth at bedtime as needed for sleep.       Allergies  Allergen Reactions  . Pregabalin Shortness Of Breath and Swelling    Other reaction(s): Tachycardia (finding)  . Codeine     Other reaction(s): Unknown .    Consultations:  None   Procedures/Studies: Dg Chest 2 View  Result Date: 09/12/2016 CLINICAL DATA:  Shortness of breath, hypertension. EXAM: CHEST  2 VIEW COMPARISON:  Radiographs of December 16, 2005. FINDINGS: The heart size and mediastinal contours are within normal limits. Both lungs are clear. No pneumothorax or pleural effusion is noted. The visualized skeletal structures are unremarkable. IMPRESSION: No active cardiopulmonary disease. Electronically Signed   By: Lupita RaiderJames  Green Jr, M.D.   On: 09/12/2016 11:50      Subjective: No complains.  Discharge Exam: Vitals:   09/14/16 0500 09/14/16 0749  BP: (!) 152/97 (!) 169/96  Pulse: 66 68  Resp:  16  Temp: 98 F (36.7 C) 98.3 F (36.8 C)   Vitals:   09/13/16 2030 09/14/16 0103 09/14/16 0500 09/14/16 0749  BP: (!) 157/88 135/87 (!) 152/97 (!) 169/96  Pulse: 69 66 66 68  Resp:    16  Temp: 98 F (36.7 C) 98 F (36.7 C) 98 F (36.7 C) 98.3 F (36.8 C)  TempSrc: Oral Oral Oral Oral  SpO2: 97% 98% 100% 99%  Weight:   63 kg (138 lb 12.8 oz)     General: Pt is alert, awake, not in acute distress Cardiovascular: RRR, S1/S2 +, no rubs, no gallops Respiratory: CTA bilaterally, no wheezing, no rhonchi Abdominal: Soft, NT, ND, bowel sounds + Extremities: no edema, no cyanosis    The results of significant diagnostics from this hospitalization (including imaging, microbiology, ancillary and laboratory) are listed below for reference.     Microbiology: No results  found for this or any previous visit (from the past 240 hour(s)).   Labs: BNP (last 3 results) No results for input(s): BNP in the last 8760 hours. Basic Metabolic Panel:  Recent Labs Lab 09/12/16 1117 09/13/16 0338 09/14/16 0416  NA 139 139 139  K 2.9* 4.4 4.5  CL 103 108 107  CO2 24 25 27   GLUCOSE 139* 94 91  BUN 8 11 8   CREATININE 0.89 0.73 0.69  CALCIUM 9.4 8.9 8.9  MG  --   --  1.9  PHOS  --   --  2.8   Liver Function Tests: No results for input(s): AST, ALT, ALKPHOS, BILITOT, PROT, ALBUMIN in the last 168 hours. No results for input(s): LIPASE, AMYLASE in the last 168 hours. No results for input(s): AMMONIA in the last 168 hours. CBC:  Recent Labs Lab 09/12/16 1117 09/13/16 0338 09/14/16 0416  WBC 9.3 6.4 5.8  NEUTROABS 6.2  --   --  HGB 15.0 13.4 13.2  HCT 43.0 40.0 40.1  MCV 90.1 91.5 92.8  PLT 197 178 155   Cardiac Enzymes: No results for input(s): CKTOTAL, CKMB, CKMBINDEX, TROPONINI in the last 168 hours. BNP: Invalid input(s): POCBNP CBG:  Recent Labs Lab 09/12/16 1036 09/13/16 0748 09/14/16 0543  GLUCAP 159* 83 109*   D-Dimer No results for input(s): DDIMER in the last 72 hours. Hgb A1c No results for input(s): HGBA1C in the last 72 hours. Lipid Profile No results for input(s): CHOL, HDL, LDLCALC, TRIG, CHOLHDL, LDLDIRECT in the last 72 hours. Thyroid function studies  Recent Labs  09/13/16 1458  TSH 1.804   Anemia work up No results for input(s): VITAMINB12, FOLATE, FERRITIN, TIBC, IRON, RETICCTPCT in the last 72 hours. Urinalysis    Component Value Date/Time   COLORURINE YELLOW 09/12/2016 1150   APPEARANCEUR HAZY (A) 09/12/2016 1150   APPEARANCEUR Hazy 01/04/2012 1748   LABSPEC 1.009 09/12/2016 1150   LABSPEC 1.006 01/04/2012 1748   PHURINE 6.0 09/12/2016 1150   GLUCOSEU NEGATIVE 09/12/2016 1150   GLUCOSEU Negative 01/04/2012 1748   HGBUR SMALL (A) 09/12/2016 1150   BILIRUBINUR NEGATIVE 09/12/2016 1150   BILIRUBINUR  Negative 01/04/2012 1748   KETONESUR NEGATIVE 09/12/2016 1150   PROTEINUR NEGATIVE 09/12/2016 1150   NITRITE NEGATIVE 09/12/2016 1150   LEUKOCYTESUR SMALL (A) 09/12/2016 1150   LEUKOCYTESUR Negative 01/04/2012 1748   Sepsis Labs Invalid input(s): PROCALCITONIN,  WBC,  LACTICIDVEN Microbiology No results found for this or any previous visit (from the past 240 hour(s)).   Time coordinating discharge: Over 30 minutes  SIGNED:   Marinda Elk, MD  [facility] 09/14/2016, 9:16 AM Pager   If 7PM-7AM, please contact night-coverage [email or web address] Password [email or web address]

## 2016-09-14 NOTE — Care Management Note (Signed)
Case Management Note  Patient Details  Name: Crystal Roberson MRN: [id number] Date of Birth: 10-21-1965  Subjective/Objective:  Pt presented for Syncope. [place]. Family support at the bedside.                   Action/Plan: CM did speak with the pt in regards [facility]. Pt refused HH Services at this time. Per pt she was having issues with her pain management clinic in [place]. The Physician had moved to another practice. Pt to call the Previous Practice to see if any provider will be able to see her due to she had been established with MD [facility] for 10 years. No further needs from CM at this time.   Expected Discharge Date:                  Expected Discharge Plan:  Home/Self Care  In-House Referral:  NA  Discharge planning Services  CM Consult  Post Acute Care Choice:  NA Choice offered to:  NA  DME Arranged:  N/A DME Agency:  NA  HH Arranged:  NA HH Agency:  NA  Status of Service:  Completed, signed off  If discussed at Long Length of Stay Meetings, dates discussed:    Additional Comments:  Crystal Roberson, Crystal Fatica Kaye, RN 09/14/2016, 12:32 PM

## 2016-09-19 ENCOUNTER — Telehealth: Payer: Self-pay | Admitting: Family Medicine

## 2016-09-19 NOTE — Telephone Encounter (Signed)
HFU, Dx was High blood pressure. Pt is scheduled for 09/26/2016 @ 11:30am. Thank you!

## 2016-09-20 NOTE — Telephone Encounter (Signed)
Was not able to TCM patient was out side of 48 hour window upon notification.

## 2016-09-20 NOTE — Telephone Encounter (Signed)
This needs a TCM call, thanks 

## 2016-09-23 ENCOUNTER — Inpatient Hospital Stay (HOSPITAL_COMMUNITY): Payer: Self-pay

## 2016-09-23 ENCOUNTER — Encounter (HOSPITAL_COMMUNITY)
Admission: EM | Disposition: A | Discharge status: Home / Self Care | Payer: Self-pay | Source: Home / Self Care | Attending: Internal Medicine

## 2016-09-23 ENCOUNTER — Inpatient Hospital Stay (HOSPITAL_COMMUNITY)
Admission: EM | Admit: 2016-09-23 | Discharge: 2016-09-26 | DRG: 246 | Disposition: A | Discharge status: Home / Self Care | Payer: Self-pay | Attending: Internal Medicine | Admitting: Internal Medicine

## 2016-09-23 ENCOUNTER — Encounter (HOSPITAL_COMMUNITY): Payer: Self-pay | Admitting: *Deleted

## 2016-09-23 DIAGNOSIS — E876 Hypokalemia: Secondary | ICD-10-CM | POA: Diagnosis present

## 2016-09-23 DIAGNOSIS — I2102 ST elevation (STEMI) myocardial infarction involving left anterior descending coronary artery: Principal | ICD-10-CM | POA: Diagnosis present

## 2016-09-23 DIAGNOSIS — Z79899 Other long term (current) drug therapy: Secondary | ICD-10-CM

## 2016-09-23 DIAGNOSIS — I110 Hypertensive heart disease with heart failure: Secondary | ICD-10-CM | POA: Diagnosis present

## 2016-09-23 DIAGNOSIS — F17200 Nicotine dependence, unspecified, uncomplicated: Secondary | ICD-10-CM | POA: Diagnosis present

## 2016-09-23 DIAGNOSIS — I5041 Acute combined systolic (congestive) and diastolic (congestive) heart failure: Secondary | ICD-10-CM | POA: Diagnosis present

## 2016-09-23 DIAGNOSIS — F329 Major depressive disorder, single episode, unspecified: Secondary | ICD-10-CM | POA: Diagnosis present

## 2016-09-23 DIAGNOSIS — I951 Orthostatic hypotension: Secondary | ICD-10-CM | POA: Diagnosis present

## 2016-09-23 DIAGNOSIS — G8929 Other chronic pain: Secondary | ICD-10-CM | POA: Diagnosis present

## 2016-09-23 DIAGNOSIS — I213 ST elevation (STEMI) myocardial infarction of unspecified site: Secondary | ICD-10-CM

## 2016-09-23 DIAGNOSIS — F17210 Nicotine dependence, cigarettes, uncomplicated: Secondary | ICD-10-CM | POA: Diagnosis present

## 2016-09-23 DIAGNOSIS — I255 Ischemic cardiomyopathy: Secondary | ICD-10-CM | POA: Diagnosis present

## 2016-09-23 DIAGNOSIS — I2510 Atherosclerotic heart disease of native coronary artery without angina pectoris: Secondary | ICD-10-CM | POA: Diagnosis present

## 2016-09-23 DIAGNOSIS — M545 Low back pain, unspecified: Secondary | ICD-10-CM | POA: Diagnosis present

## 2016-09-23 HISTORY — PX: CARDIAC CATHETERIZATION: SHX172

## 2016-09-23 LAB — LIPID PANEL
CHOL/HDL RATIO: 4.5 ratio
CHOLESTEROL: 240 mg/dL — AB (ref 0–200)
HDL: 53 mg/dL (ref >40)
LDL Cholesterol: 156 mg/dL — ABNORMAL HIGH (ref 0–99)
TRIGLYCERIDES: 153 mg/dL — AB (ref <150)
VLDL: 31 mg/dL (ref 0–40)

## 2016-09-23 LAB — DIFFERENTIAL
BASOS ABS: 0.1 10*3/uL (ref 0.0–0.1)
BASOS PCT: 1 %
Eosinophils Absolute: 0.1 10*3/uL (ref 0.0–0.7)
Eosinophils Relative: 1 %
LYMPHS ABS: 4.5 10*3/uL — AB (ref 0.7–4.0)
LYMPHS PCT: 33 %
MONOS PCT: 8 %
Monocytes Absolute: 1 10*3/uL (ref 0.1–1.0)
NEUTROS ABS: 7.9 10*3/uL — AB (ref 1.7–7.7)
Neutrophils Relative %: 57 %

## 2016-09-23 LAB — BASIC METABOLIC PANEL
ANION GAP: 5 (ref 5–15)
BUN: 8 mg/dL (ref 6–20)
CHLORIDE: 105 mmol/L (ref 101–111)
CO2: 25 mmol/L (ref 22–32)
Calcium: 8.6 mg/dL — ABNORMAL LOW (ref 8.9–10.3)
Creatinine, Ser: 0.64 mg/dL (ref 0.44–1.00)
GFR calc Af Amer: >60 mL/min (ref >60)
Glucose, Bld: 133 mg/dL — ABNORMAL HIGH (ref 65–99)
POTASSIUM: 3.7 mmol/L (ref 3.5–5.1)
SODIUM: 135 mmol/L (ref 135–145)

## 2016-09-23 LAB — APTT: APTT: 31 s (ref 24–36)

## 2016-09-23 LAB — CBC
HEMATOCRIT: 41.8 % (ref 36.0–46.0)
HEMOGLOBIN: 14.6 g/dL (ref 12.0–15.0)
MCH: 31 pg (ref 26.0–34.0)
MCHC: 34.9 g/dL (ref 30.0–36.0)
MCV: 88.7 fL (ref 78.0–100.0)
Platelets: 230 10*3/uL (ref 150–400)
RBC: 4.71 MIL/uL (ref 3.87–5.11)
RDW: 12 % (ref 11.5–15.5)
WBC: 13.6 10*3/uL — AB (ref 4.0–10.5)

## 2016-09-23 LAB — COMPREHENSIVE METABOLIC PANEL
ALT: 17 U/L (ref 14–54)
AST: 26 U/L (ref 15–41)
Albumin: 3.6 g/dL (ref 3.5–5.0)
Alkaline Phosphatase: 118 U/L (ref 38–126)
Anion gap: 12 (ref 5–15)
BUN: 8 mg/dL (ref 6–20)
CHLORIDE: 99 mmol/L — AB (ref 101–111)
CO2: 24 mmol/L (ref 22–32)
CREATININE: 0.76 mg/dL (ref 0.44–1.00)
Calcium: 9.2 mg/dL (ref 8.9–10.3)
GFR calc non Af Amer: >60 mL/min (ref >60)
GLUCOSE: 164 mg/dL — AB (ref 65–99)
Potassium: 2.9 mmol/L — ABNORMAL LOW (ref 3.5–5.1)
SODIUM: 135 mmol/L (ref 135–145)
Total Bilirubin: 0.3 mg/dL (ref 0.3–1.2)
Total Protein: 6.8 g/dL (ref 6.5–8.1)

## 2016-09-23 LAB — TROPONIN I
Troponin I: 0.07 ng/mL (ref <0.03)
Troponin I: >65 ng/mL (ref <0.03)

## 2016-09-23 LAB — PROTIME-INR
INR: 0.97
Prothrombin Time: 12.9 seconds (ref 11.4–15.2)

## 2016-09-23 LAB — MRSA PCR SCREENING: MRSA by PCR: NEGATIVE

## 2016-09-23 SURGERY — LEFT HEART CATH AND CORONARY ANGIOGRAPHY
Anesthesia: LOCAL

## 2016-09-23 MED ORDER — IOPAMIDOL (ISOVUE-370) INJECTION 76%
INTRAVENOUS | Status: DC | PRN
  Administered 2016-09-23: 175 mL via INTRA_ARTERIAL

## 2016-09-23 MED ORDER — HYDROCODONE-ACETAMINOPHEN 5-325 MG PO TABS
1.0000 | ORAL_TABLET | ORAL | Status: DC | PRN
  Administered 2016-09-23: 1 via ORAL
  Administered 2016-09-23 – 2016-09-25 (×6): 2 via ORAL
  Administered 2016-09-25: 1 via ORAL
  Administered 2016-09-25 (×2): 2 via ORAL
  Administered 2016-09-26: 1 via ORAL
  Filled 2016-09-23 (×4): qty 2
  Filled 2016-09-23: qty 1
  Filled 2016-09-23 (×6): qty 2

## 2016-09-23 MED ORDER — TICAGRELOR 90 MG PO TABS
ORAL_TABLET | ORAL | Status: DC | PRN
  Administered 2016-09-23: 180 mg via ORAL

## 2016-09-23 MED ORDER — CARVEDILOL 3.125 MG PO TABS
3.1250 mg | ORAL_TABLET | Freq: Two times a day (BID) | ORAL | Status: DC
  Administered 2016-09-23: 3.125 mg via ORAL
  Filled 2016-09-23 (×2): qty 1

## 2016-09-23 MED ORDER — SODIUM CHLORIDE 0.9 % IV SOLN
INTRAVENOUS | Status: DC | PRN
  Administered 2016-09-23: 63 mL/h via INTRAVENOUS

## 2016-09-23 MED ORDER — TICAGRELOR 90 MG PO TABS
ORAL_TABLET | ORAL | Status: AC
  Filled 2016-09-23: qty 2

## 2016-09-23 MED ORDER — ZOLPIDEM TARTRATE 5 MG PO TABS
5.0000 mg | ORAL_TABLET | Freq: Every evening | ORAL | Status: DC | PRN
  Filled 2016-09-23: qty 1

## 2016-09-23 MED ORDER — MIDAZOLAM HCL 2 MG/2ML IJ SOLN
INTRAMUSCULAR | Status: DC | PRN
  Administered 2016-09-23: 1 mg via INTRAVENOUS

## 2016-09-23 MED ORDER — MIDAZOLAM HCL 2 MG/2ML IJ SOLN
INTRAMUSCULAR | Status: AC
  Filled 2016-09-23: qty 2

## 2016-09-23 MED ORDER — HEPARIN SODIUM (PORCINE) 5000 UNIT/ML IJ SOLN
60.0000 [IU]/kg | Freq: Once | INTRAMUSCULAR | Status: AC
  Administered 2016-09-23: 3800 [IU] via INTRAVENOUS

## 2016-09-23 MED ORDER — FENTANYL CITRATE (PF) 100 MCG/2ML IJ SOLN
INTRAMUSCULAR | Status: DC | PRN
  Administered 2016-09-23: 25 ug via INTRAVENOUS

## 2016-09-23 MED ORDER — FLUOXETINE HCL 20 MG PO CAPS
40.0000 mg | ORAL_CAPSULE | Freq: Every day | ORAL | Status: DC
  Administered 2016-09-23 – 2016-09-26 (×4): 40 mg via ORAL
  Filled 2016-09-23 (×5): qty 2

## 2016-09-23 MED ORDER — MORPHINE SULFATE (PF) 4 MG/ML IV SOLN
4.0000 mg | INTRAVENOUS | Status: DC | PRN
  Administered 2016-09-23 – 2016-09-24 (×5): 4 mg via INTRAVENOUS
  Filled 2016-09-23 (×6): qty 1

## 2016-09-23 MED ORDER — ATROPINE SULFATE 1 MG/10ML IJ SOSY
PREFILLED_SYRINGE | INTRAMUSCULAR | Status: AC
  Filled 2016-09-23: qty 10

## 2016-09-23 MED ORDER — LABETALOL HCL 5 MG/ML IV SOLN: 10.0000 mg | INTRAVENOUS | Status: AC | PRN

## 2016-09-23 MED ORDER — POTASSIUM CHLORIDE CRYS ER 20 MEQ PO TBCR
40.0000 meq | EXTENDED_RELEASE_TABLET | Freq: Once | ORAL | Status: AC
  Administered 2016-09-23: 40 meq via ORAL
  Filled 2016-09-23: qty 2

## 2016-09-23 MED ORDER — SODIUM CHLORIDE 0.9% FLUSH
3.0000 mL | Freq: Two times a day (BID) | INTRAVENOUS | Status: DC
  Administered 2016-09-24: 3 mL via INTRAVENOUS

## 2016-09-23 MED ORDER — SODIUM CHLORIDE 0.9 % IV SOLN: 10.0000 mL/h | INTRAVENOUS | Status: DC

## 2016-09-23 MED ORDER — LIDOCAINE 5 % EX PTCH
1.0000 | MEDICATED_PATCH | Freq: Every day | CUTANEOUS | Status: DC | PRN
  Administered 2016-09-25: 1 via TRANSDERMAL
  Filled 2016-09-23 (×3): qty 1

## 2016-09-23 MED ORDER — IOPAMIDOL (ISOVUE-370) INJECTION 76%
INTRAVENOUS | Status: AC
  Filled 2016-09-23: qty 50

## 2016-09-23 MED ORDER — LIDOCAINE HCL (PF) 1 % IJ SOLN
INTRAMUSCULAR | Status: DC | PRN
  Administered 2016-09-23: 15 mL via INTRADERMAL

## 2016-09-23 MED ORDER — ATORVASTATIN CALCIUM 80 MG PO TABS
80.0000 mg | ORAL_TABLET | Freq: Every day | ORAL | Status: DC
  Administered 2016-09-23 – 2016-09-25 (×3): 80 mg via ORAL
  Filled 2016-09-23 (×3): qty 1

## 2016-09-23 MED ORDER — HEPARIN (PORCINE) IN NACL 2-0.9 UNIT/ML-% IJ SOLN
INTRAMUSCULAR | Status: AC
  Filled 2016-09-23: qty 1000

## 2016-09-23 MED ORDER — SODIUM CHLORIDE 0.9 % IV SOLN: 250.0000 mL | INTRAVENOUS | Status: DC | PRN

## 2016-09-23 MED ORDER — ONDANSETRON HCL 4 MG/2ML IJ SOLN
INTRAMUSCULAR | Status: DC | PRN
  Administered 2016-09-23: 4 mg via INTRAVENOUS

## 2016-09-23 MED ORDER — HEPARIN SODIUM (PORCINE) 1000 UNIT/ML IJ SOLN
INTRAMUSCULAR | Status: DC | PRN
  Administered 2016-09-23: 3000 [IU] via INTRAVENOUS

## 2016-09-23 MED ORDER — ONDANSETRON HCL 4 MG/2ML IJ SOLN
4.0000 mg | Freq: Four times a day (QID) | INTRAMUSCULAR | Status: DC | PRN
  Administered 2016-09-23 – 2016-09-24 (×3): 4 mg via INTRAVENOUS
  Filled 2016-09-23 (×3): qty 2

## 2016-09-23 MED ORDER — ACETAMINOPHEN 325 MG PO TABS: 650.0000 mg | ORAL_TABLET | ORAL | Status: DC | PRN

## 2016-09-23 MED ORDER — IOPAMIDOL (ISOVUE-370) INJECTION 76%
INTRAVENOUS | Status: AC
  Filled 2016-09-23: qty 125

## 2016-09-23 MED ORDER — SODIUM CHLORIDE 0.9 % IV SOLN
30.0000 meq | Freq: Once | INTRAVENOUS | Status: AC
  Administered 2016-09-23: 30 meq via INTRAVENOUS
  Filled 2016-09-23: qty 15

## 2016-09-23 MED ORDER — SODIUM CHLORIDE 0.9% FLUSH: 3.0000 mL | INTRAVENOUS | Status: DC | PRN

## 2016-09-23 MED ORDER — NITROGLYCERIN 0.4 MG SL SUBL
0.4000 mg | SUBLINGUAL_TABLET | SUBLINGUAL | Status: DC | PRN
  Administered 2016-09-23: 0.4 mg via SUBLINGUAL
  Filled 2016-09-23: qty 1

## 2016-09-23 MED ORDER — NITROGLYCERIN 1 MG/10 ML FOR IR/CATH LAB
INTRA_ARTERIAL | Status: DC | PRN
  Administered 2016-09-23 (×2): 100 ug via INTRACORONARY

## 2016-09-23 MED ORDER — CANGRELOR TETRASODIUM 50 MG IV SOLR
INTRAVENOUS | Status: AC
  Filled 2016-09-23: qty 50

## 2016-09-23 MED ORDER — FENTANYL CITRATE (PF) 100 MCG/2ML IJ SOLN
INTRAMUSCULAR | Status: AC
  Filled 2016-09-23: qty 2

## 2016-09-23 MED ORDER — HEPARIN (PORCINE) IN NACL 2-0.9 UNIT/ML-% IJ SOLN
INTRAMUSCULAR | Status: DC | PRN
  Administered 2016-09-23: 1000 mL

## 2016-09-23 MED ORDER — ASPIRIN 81 MG PO CHEW: 324.0000 mg | CHEWABLE_TABLET | Freq: Once | ORAL | Status: DC

## 2016-09-23 MED ORDER — ENOXAPARIN SODIUM 40 MG/0.4ML ~~LOC~~ SOLN
40.0000 mg | SUBCUTANEOUS | Status: DC
  Administered 2016-09-24 – 2016-09-26 (×3): 40 mg via SUBCUTANEOUS
  Filled 2016-09-23 (×3): qty 0.4

## 2016-09-23 MED ORDER — NITROGLYCERIN 1 MG/10 ML FOR IR/CATH LAB
INTRA_ARTERIAL | Status: AC
  Filled 2016-09-23: qty 10

## 2016-09-23 MED ORDER — HEPARIN SODIUM (PORCINE) 5000 UNIT/ML IJ SOLN
60.0000 [IU]/kg | INTRAMUSCULAR | Status: AC
  Administered 2016-09-23: 3800 [IU] via INTRAVENOUS

## 2016-09-23 MED ORDER — SODIUM CHLORIDE 0.9 % IV SOLN
4.0000 ug/kg/min | INTRAVENOUS | Status: AC
  Filled 2016-09-23: qty 50

## 2016-09-23 MED ORDER — SODIUM CHLORIDE 0.9% FLUSH
3.0000 mL | Freq: Two times a day (BID) | INTRAVENOUS | Status: DC
  Administered 2016-09-23 – 2016-09-25 (×4): 3 mL via INTRAVENOUS

## 2016-09-23 MED ORDER — HEPARIN SODIUM (PORCINE) 1000 UNIT/ML IJ SOLN
INTRAMUSCULAR | Status: AC
  Filled 2016-09-23: qty 1

## 2016-09-23 MED ORDER — LIDOCAINE HCL (PF) 1 % IJ SOLN
INTRAMUSCULAR | Status: AC
  Filled 2016-09-23: qty 30

## 2016-09-23 MED ORDER — BUPROPION HCL ER (XL) 150 MG PO TB24
150.0000 mg | ORAL_TABLET | Freq: Every day | ORAL | Status: DC
  Administered 2016-09-24 – 2016-09-26 (×3): 150 mg via ORAL
  Filled 2016-09-23 (×4): qty 1

## 2016-09-23 MED ORDER — ASPIRIN 81 MG PO CHEW
81.0000 mg | CHEWABLE_TABLET | Freq: Every day | ORAL | Status: DC
  Administered 2016-09-24 – 2016-09-26 (×3): 81 mg via ORAL
  Filled 2016-09-23 (×3): qty 1

## 2016-09-23 MED ORDER — CANGRELOR TETRASODIUM 50 MG IV SOLR
INTRAVENOUS | Status: DC | PRN
  Administered 2016-09-23: 0.75 ug/kg/min via INTRAVENOUS

## 2016-09-23 MED ORDER — HYDRALAZINE HCL 20 MG/ML IJ SOLN: 5.0000 mg | INTRAMUSCULAR | Status: AC | PRN

## 2016-09-23 MED ORDER — MORPHINE SULFATE (PF) 4 MG/ML IV SOLN: 4.0000 mg | Freq: Once | INTRAVENOUS | Status: AC

## 2016-09-23 MED ORDER — TIZANIDINE HCL 2 MG PO TABS
2.0000 mg | ORAL_TABLET | Freq: Two times a day (BID) | ORAL | Status: DC | PRN
  Filled 2016-09-23: qty 1

## 2016-09-23 MED ORDER — CANGRELOR BOLUS VIA INFUSION
INTRAVENOUS | Status: DC | PRN
  Administered 2016-09-23: 1890 ug via INTRAVENOUS

## 2016-09-23 MED ORDER — SODIUM CHLORIDE 0.9 % IV SOLN: INTRAVENOUS | Status: DC | PRN

## 2016-09-23 MED ORDER — TICAGRELOR 90 MG PO TABS
90.0000 mg | ORAL_TABLET | Freq: Two times a day (BID) | ORAL | Status: DC
  Administered 2016-09-23 – 2016-09-26 (×6): 90 mg via ORAL
  Filled 2016-09-23 (×6): qty 1

## 2016-09-23 SURGICAL SUPPLY — 18 items
BALLN MOZEC 2.0X12 (BALLOONS) ×2
BALLN ~~LOC~~ MOZEC 2.5X15 (BALLOONS) ×2
BALLOON MOZEC 2.0X12 (BALLOONS) IMPLANT
BALLOON ~~LOC~~ MOZEC 2.5X15 (BALLOONS) IMPLANT
CATH INFINITI 5FR ANG PIGTAIL (CATHETERS) ×1 IMPLANT
CATH INFINITI JR4 5F (CATHETERS) ×1 IMPLANT
CATH VISTA GUIDE 6FR XB3.5 (CATHETERS) ×1 IMPLANT
KIT ENCORE 26 ADVANTAGE (KITS) ×1 IMPLANT
KIT HEART LEFT (KITS) ×2 IMPLANT
PACK CARDIAC CATHETERIZATION (CUSTOM PROCEDURE TRAY) ×2 IMPLANT
SET INTRODUCER MICROPUNCT 5F (INTRODUCER) ×1 IMPLANT
SHEATH PINNACLE 6F 10CM (SHEATH) ×1 IMPLANT
STENT PROMUS PREM MR 2.25X20 (Permanent Stent) ×1 IMPLANT
SYR MEDRAD MARK V 150ML (SYRINGE) ×2 IMPLANT
TRANSDUCER W/STOPCOCK (MISCELLANEOUS) ×2 IMPLANT
TUBING CIL FLEX 10 FLL-RA (TUBING) ×2 IMPLANT
WIRE EMERALD 3MM-J .035X150CM (WIRE) ×1 IMPLANT
WIRE RUNTHROUGH .014X180CM (WIRE) ×1 IMPLANT

## 2016-09-23 NOTE — ED Notes (Signed)
Heparin 4,000 units given.  

## 2016-09-23 NOTE — Progress Notes (Signed)
Cont to bleed after 30minutes. Pt. Uncooperative, moving and pushing on groing. Dr. Anne FuSkains notified. Morphine 4 mg IV. Femostop applied.

## 2016-09-23 NOTE — ED Triage Notes (Signed)
The pt  artived by [facility] c/o chest pain this am  With chest pain  n v sob  Chest tightness and down both arms.

## 2016-09-23 NOTE — ED Provider Notes (Signed)
[facility]-EMERGENCY DEPT Provider Note   CSN: [id number] Arrival date & time: 09/23/16  0559     History   Chief Complaint Chief Complaint  Patient presents with  . Code STEMI    HPI Crystal Roberson is a 50 y.o. female.  50 yo F with a chief complaint of chest pain. This radiates into her back and goes down her left arm. Associated with diaphoresis vomiting and diarrhea. Arrived via EMS and there is concern for ST changes made a code STEMI.    The history is provided by the patient.  Chest Pain   This is a new problem. The current episode started 3 to 5 hours ago. The problem occurs constantly. The problem has not changed since onset.The pain is associated with rest. The pain is present in the substernal region. The pain is at a severity of 10/10. The pain is severe. The quality of the pain is described as pressure-like. The pain radiates to the left arm. Duration of episode(s) is 2 hours. Associated symptoms include back pain, diaphoresis, nausea, shortness of breath and vomiting. Pertinent negatives include no dizziness, no fever, no headaches and no palpitations. She has tried nothing for the symptoms. The treatment provided no relief. There are no known risk factors.  Her past medical history is significant for hypertension.  Pertinent negatives for past medical history include no MI.    Past Medical History:  Diagnosis Date  . Allergy   . Chicken pox   . Chronic back pain   . DDD (degenerative disc disease), lumbar   . Depression   . Hypertension   . Migraines   . Syncope 09/12/2016    Patient Active Problem List   Diagnosis Date Noted  . Orthostatic hypotension 09/14/2016  . Noncompliance 09/14/2016  . Syncope 09/12/2016  . Hypokalemia 09/12/2016  . Palpitations   . Passive suicidal ideations   . 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 Anxiety 01/13/2016  . Chronic pain 11/07/2015  . Essential hypertension 11/07/2015  . Insomnia 11/07/2015  . Nicotine dependence  11/07/2015  . Depression 11/07/2015  . Facet syndrome, lumbar 03/11/2015  . Lumbar radiculopathy 03/11/2015  . Bilateral occipital neuralgia 02/07/2015  . 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 DDD (degenerative disc disease), lumbosacral 01/28/2015  . 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 CERVICAL DISC SURGERY    . TUBAL LIGATION      OB History    No data available       Home Medications    Prior to Admission medications   Medication Sig Start Date End Date Taking? Authorizing Provider  buPROPion (WELLBUTRIN XL) 150 MG 24 hr tablet Take 1 tablet (150 mg total) by mouth daily. 09/14/16   Marinda Elk, MD  FLUoxetine (PROZAC) 20 MG capsule Take 2 capsules (40 mg total) by mouth daily. 01/12/16   Tommie Sams, DO  HYDROcodone-acetaminophen (NORCO/VICODIN) 5-325 MG tablet Limit 1-3 tablets po daily if tolerated for breakthrough pain while taking tramadol 05/24/16   Ewing Schlein, MD  lidocaine (LIDODERM) 5 % Place 1 patch onto the skin daily. Apply 1-3 patches to skin  for 12 hours then Remove patches for 12 hours. Repeat process if tolerated 03/11/15   Ewing Schlein, MD  lisinopril-hydrochlorothiazide (PRINZIDE,ZESTORETIC) 20-25 MG tablet Take 1 tablet by mouth daily. 11/07/15   Tommie Sams, DO  potassium chloride SA (K-DUR,KLOR-CON) 20 MEQ tablet 1 tablet twice daily for 2 days. Then 1 tablet daily.  Patient taking differently: Take 20 mEq by mouth daily.  08/29/16   Tommie SamsJayce G Cook, DO  tiZANidine (ZANAFLEX) 2 MG tablet Limit 1 tablet by mouth per day or 2 tablets by mouth per day if tolerated 05/01/16   Ewing SchleinGregory Crisp, MD  traMADol Janean Sark(ULTRAM) 50 MG tablet Limit 2-4 tablets po daily if tolerated 05/24/16   Ewing SchleinGregory Crisp, MD  zolpidem (AMBIEN) 10 MG tablet Take 1 tablet (10 mg total) by mouth at bedtime as needed for sleep. 08/21/16   Tommie SamsJayce G Cook, DO    Family History Family History  Problem Relation Age of Onset  . Hypertension  Mother   . Arthritis Mother   . Hyperlipidemia Mother   . Mental illness Mother   . Hypertension Maternal Grandmother   . Heart disease Maternal Grandmother     Social History Social History  Substance Use Topics  . Smoking status: Current Every Day Smoker    Packs/day: 0.75    Years: 30.00    Types: Cigarettes  . Smokeless tobacco: Never Used  . Alcohol use No     Allergies   Pregabalin and Codeine   Review of Systems Review of Systems  Constitutional: Positive for diaphoresis. Negative for chills and fever.  HENT: Negative for congestion and rhinorrhea.   Eyes: Negative for redness and visual disturbance.  Respiratory: Positive for shortness of breath. Negative for wheezing.   Cardiovascular: Positive for chest pain. Negative for palpitations.  Gastrointestinal: Positive for nausea and vomiting.  Genitourinary: Negative for dysuria and urgency.  Musculoskeletal: Positive for back pain. Negative for arthralgias and myalgias.  Skin: Negative for pallor and wound.  Neurological: Negative for dizziness and headaches.     Physical Exam Updated Vital Signs BP (!) 157/108   Pulse 74   Temp 97.7 F (36.5 C) (Oral)   Resp 13   LMP  (Within Years)   SpO2 100%   Physical Exam  Constitutional: She is oriented to person, place, and time. She appears well-developed and well-nourished. She appears distressed.  HENT:  Head: Normocephalic and atraumatic.  Eyes: EOM are normal. Pupils are equal, round, and reactive to light.  Neck: Normal range of motion. Neck supple.  Cardiovascular: Normal rate and regular rhythm.  Exam reveals no gallop and no friction rub.   No murmur heard. Decreased L radial pulse compared to R  Pulmonary/Chest: Effort normal. She has no wheezes. She has no rales.  Holding levines sign  Abdominal: Soft. She exhibits no distension and no mass. There is no tenderness. There is no guarding.  Musculoskeletal: She exhibits no edema or tenderness.    Neurological: She is alert and oriented to person, place, and time.  Skin: Skin is warm. She is diaphoretic.  Psychiatric: She has a normal mood and affect. Her behavior is normal.  Nursing note and vitals reviewed.    ED Treatments / Results  Labs (all labs ordered are listed, but only abnormal results are displayed) Labs Reviewed  CBC  DIFFERENTIAL  PROTIME-INR  APTT  COMPREHENSIVE METABOLIC PANEL  TROPONIN I  LIPID PANEL    EKG  EKG Interpretation None       Radiology No results found.  Procedures Procedures (including critical care time)  Medications Ordered in ED Medications  0.9 %  sodium chloride infusion (not administered)  nitroGLYCERIN (NITROSTAT) SL tablet 0.4 mg (not administered)  heparin injection 3,800 Units (not administered)  heparin injection 3,800 Units (3,800 Units Intravenous Given 09/23/16 0614)     Initial Impression /  Assessment and Plan / ED Course  I have reviewed the triage vital signs and the nursing notes.  Pertinent labs & imaging results that were available during my care of the patient were reviewed by me and considered in my medical decision making (see chart for details).  Clinical Course     50 yo F With a chief complaint of chest pain. Made a code STEMI by EMS en route. Was given heparin aspirin nitroglycerin. Evaluated by cardiology at bedside taken urgently to the cath lab.   The patients results and plan were reviewed and discussed.   Any x-rays performed were independently reviewed by myself.   Differential diagnosis were considered with the presenting HPI.  CRITICAL CARE Performed by: Rae Roamaniel Patrick Mahek Schlesinger   Total critical care time: 35 minutes  Critical care time was exclusive of separately billable procedures and treating other patients.  Critical care was necessary to treat or prevent imminent or life-threatening deterioration.  Critical care was time spent personally by me on the following activities:  development of treatment plan with patient and/or surrogate as well as nursing, discussions with consultants, evaluation of patient's response to treatment, examination of patient, obtaining history from patient or surrogate, ordering and performing treatments and interventions, ordering and review of laboratory studies, ordering and review of radiographic studies, pulse oximetry and re-evaluation of patient's condition.   Medications  0.9 %  sodium chloride infusion (not administered)  nitroGLYCERIN (NITROSTAT) SL tablet 0.4 mg (not administered)  heparin injection 3,800 Units (not administered)  heparin injection 3,800 Units (3,800 Units Intravenous Given 09/23/16 0614)    Vitals:   09/23/16 0607 09/23/16 0609  BP: (!) 156/108 (!) 157/108  Pulse: 74 74  Resp:  13  Temp: 97.7 F (36.5 C)   TempSrc: Oral   SpO2: 100% 100%    Final diagnoses:  ST elevation myocardial infarction involving left anterior descending (LAD) coronary artery (HCC)      Final Clinical Impressions(s) / ED Diagnoses   Final diagnoses:  ST elevation myocardial infarction involving left anterior descending (LAD) coronary artery [facility](HCC)    New Prescriptions New Prescriptions   No medications on file     Melene Planan Jayne Peckenpaugh, DO 09/23/16 [id number]

## 2016-09-23 NOTE — Progress Notes (Signed)
    CATH: Conclusions: 1. Anterior STEMI with 99% mid LAD stenosis with TIMI-2 flow. 2. Mild to moderate, non-obstructive coronary artery disease involving the ostial and distal LAD, ostial and mid LCx, and mid/distal RCA. 3. Severely reduced left ventricular contraction with anterior, apical, and apical inferior akinesis.  LVEF 25-35%. 4. Moderately increased left ventricular filling pressure (LVEDP 27 mmHg).  Recommendations: 1. Dual antiplatelet therapy for at least 12 months. 2. Discontinue cangrelor 2 hours after ticagrelor 180 mg was given. 3. Aggressive secondary prevention, including high-intensity statin therapy and tobacco cessation. 4. Replete potassium and recheck BMP at 1500. 5. Consider gentle hydration once potassium has been adequately repleted. 6. Initiate carvedilol 3.125 mg BID; add lisinopril as blood pressure allows. 7. Remove right femoral artery sheath once ACT has fallen below 175 seconds.  Yvonne Kendallhristopher End, MD  Hematoma noted with sheath in place.  NTG dropped BP, now back up.  KCL IV running - burns she states. K 2.9 Will monitor.   Donato SchultzMark Skains, MD

## 2016-09-23 NOTE — ED Notes (Signed)
Nitro sl  X 1

## 2016-09-23 NOTE — H&P (Signed)
History and Physical  Primary Cardiologist: New PCP: Tommie SamsJayce G Cook, DO  Chief Complaint: Code STEMI  HPI:   50 year old female with history of chronic back pain, tobacco abuse, HTN, recent admission for syncopal event presenting as a code STEMI.    Patient acutely awoke early this AM ~5 AM with chest and back pain associated with nausea and vomiting.  EMS arrived and activated STEMI code.  EMS ECG with significant artifact (from tremor), repeat immediately upon arrival more reflective of anterior STEMI and patient immediately taken to the cath lab.    Chest pain 10/10 with N/V, chills.    Recent syncopal event thought likely due to orthostatic hypotension given + orthostatic vital signs.   EMS: 324 ASA given.   Heparin bolus given in the ED.    Prior Studies Echo 09/13/16 Study Conclusions  - Left ventricle: The cavity size was normal. Systolic function was   normal. The estimated ejection fraction was in the range of 60%   to 65%. Wall motion was normal; there were no regional wall   motion abnormalities. Left ventricular diastolic function   parameters were normal. There was no evidence of elevated   ventricular filling pressure by Doppler parameters. - Aortic valve: Trileaflet; normal thickness leaflets. There was no   regurgitation. - Aortic root: The aortic root was normal in size. - Mitral valve: Structurally normal valve. - Right ventricle: The cavity size was normal. Wall thickness was   normal. Systolic function was normal. - Tricuspid valve: There was trivial regurgitation. - Pulmonic valve: There was no regurgitation. - Pulmonary arteries: Systolic pressure was within the normal   range. - Inferior vena cava: The vessel was normal in size. - Pericardium, extracardiac: There was no pericardial effusion.  Impressions:  - Trivial mitral and tricuspid regurgitation, otherwise normal   echocardiogram.  Past Medical History:  Diagnosis Date  . Allergy     . Chicken pox   . Chronic back pain   . DDD (degenerative disc disease), lumbar   . Depression   . Hypertension   . Migraines   . Syncope 09/12/2016    Past Surgical History:  Procedure Laterality Date  . CERVICAL DISC SURGERY    . TUBAL LIGATION      Family History  Problem Relation Age of Onset  . Hypertension Mother   . Arthritis Mother   . Hyperlipidemia Mother   . Mental illness Mother   . Hypertension Maternal Grandmother   . Heart disease Maternal Grandmother    Social History:  reports that she has been smoking Cigarettes.  She has a 22.50 pack-year smoking history. She has never used smokeless tobacco. She reports that she does not drink alcohol or use drugs.  Allergies:  Allergies  Allergen Reactions  . Pregabalin Shortness Of Breath and Swelling    Other reaction(s): Tachycardia (finding)  . Codeine     Other reaction(s): Unknown .    No current facility-administered medications on file prior to encounter.    Current Outpatient Prescriptions on File Prior to Encounter  Medication Sig Dispense Refill  . buPROPion (WELLBUTRIN XL) 150 MG 24 hr tablet Take 1 tablet (150 mg total) by mouth daily. 30 tablet 0  . FLUoxetine (PROZAC) 20 MG capsule Take 2 capsules (40 mg total) by mouth daily. 180 capsule 1  . HYDROcodone-acetaminophen (NORCO/VICODIN) 5-325 MG tablet Limit 1-3 tablets po daily if tolerated for breakthrough pain while taking tramadol 90 tablet 0  . lidocaine (LIDODERM) 5 %  Place 1 patch onto the skin daily. Apply 1-3 patches to skin  for 12 hours then Remove patches for 12 hours. Repeat process if tolerated 90 patch 2  . lisinopril-hydrochlorothiazide (PRINZIDE,ZESTORETIC) 20-25 MG tablet Take 1 tablet by mouth daily. 90 tablet 3  . potassium chloride SA (K-DUR,KLOR-CON) 20 MEQ tablet 1 tablet twice daily for 2 days. Then 1 tablet daily. (Patient taking differently: Take 20 mEq by mouth daily. ) 90 tablet 0  . tiZANidine (ZANAFLEX) 2 MG tablet Limit  1 tablet by mouth per day or 2 tablets by mouth per day if tolerated 50 tablet 0  . traMADol (ULTRAM) 50 MG tablet Limit 2-4 tablets po daily if tolerated 120 tablet 0  . zolpidem (AMBIEN) 10 MG tablet Take 1 tablet (10 mg total) by mouth at bedtime as needed for sleep. 30 tablet 0    @medshecduled @ @medinfusions @  No results found for this or any previous visit (from the past 48 hour(s)). No results found.  ECG/Tele: NSR with anterior precordial ST elevation -- STEMI  ROS: As above. Otherwise, review of systems is negative unless per above HPI  Vitals:   09/23/16 0607 09/23/16 0609  BP: (!) 156/108 (!) 157/108  Pulse: 74 74  Resp:  13  Temp: 97.7 F (36.5 C)   TempSrc: Oral   SpO2: 100% 100%   Wt Readings from Last 10 Encounters:  09/14/16 63 kg (138 lb 12.8 oz)  08/21/16 63.2 kg (139 lb 4 oz)  08/17/16 61.7 kg (136 lb)  05/24/16 63.3 kg (139 lb 8 oz)  05/01/16 64.4 kg (142 lb)  04/04/16 62.1 kg (137 lb)  03/15/16 63.5 kg (140 lb)  03/02/16 62.6 kg (138 lb)  02/03/16 62.1 kg (137 lb)  01/12/16 62.4 kg (137 lb 8 oz)    PE:  General: Active distress, thin, pale, clammy HEENT: Atraumatic, EOMI, mucous membranes dry CV: Tachycardic, no murmurs/ gallops. No JVD at 45 degrees. No HJR. Respiratory: Clear, no crackles. Normal work of breathing ABD: Non-distended and non-tender. No palpable organomegaly.  Extremities: 2+ radial pulses bilaterally. No edema. Neuro/Psych: CN grossly intact, alert and oriented  Assessment/Plan STEMI HTN Depression Chronic Pain  50 year old female with history of chronic back pain, tobacco abuse, HTN, recent admission for syncopal event presenting as a code STEMI.    Plan - Emergent left heart catheterization; S/P 325 ASA en route - Further recommendations/orders pending results of the case  Seen with Dr. Shannan HarperEnd  Greg Means  MD 09/23/2016, 6:24 AM

## 2016-09-23 NOTE — Progress Notes (Signed)
R femoral sheath pull started at 1247 with manual pressure.  Prior to starting, pt had hematoma, MD aware and assessed.  Manual pressure held for 30 minutes then fem stop applied per MD order.  Palpable R pedal pulses throughout sheath pull.  VSS throughout sheath pull. Will continue to monitor pt's fem stop very closely.  Emotional support provided.

## 2016-09-23 NOTE — ED Notes (Signed)
Heparin 4.000 units iv emily rn

## 2016-09-23 NOTE — ED Notes (Signed)
Aspirin 324mg  given by ems

## 2016-09-23 NOTE — ED Notes (Signed)
Hepatin 4,000 units given bolus emily rn

## 2016-09-23 NOTE — Progress Notes (Signed)
1140. Pt. C/o of chest and back pain says it is a 10. Unable to distinguish between her usual back pain. Zofran, hydrocodone and morphine 4 mg given. ECG obtained.She is shaking and moaning. Poor ECG quality. Dr.Skains aware of of the above.

## 2016-09-24 LAB — CBC
HEMATOCRIT: 34.6 % — AB (ref 36.0–46.0)
Hemoglobin: 11.8 g/dL — ABNORMAL LOW (ref 12.0–15.0)
MCH: 30.6 pg (ref 26.0–34.0)
MCHC: 34.1 g/dL (ref 30.0–36.0)
MCV: 89.6 fL (ref 78.0–100.0)
PLATELETS: 208 10*3/uL (ref 150–400)
RBC: 3.86 MIL/uL — ABNORMAL LOW (ref 3.87–5.11)
RDW: 12.3 % (ref 11.5–15.5)
WBC: 11.2 10*3/uL — ABNORMAL HIGH (ref 4.0–10.5)

## 2016-09-24 LAB — BASIC METABOLIC PANEL
Anion gap: 5 (ref 5–15)
BUN: 7 mg/dL (ref 6–20)
CHLORIDE: 102 mmol/L (ref 101–111)
CO2: 28 mmol/L (ref 22–32)
CREATININE: 0.85 mg/dL (ref 0.44–1.00)
Calcium: 8.9 mg/dL (ref 8.9–10.3)
GFR calc Af Amer: >60 mL/min (ref >60)
GFR calc non Af Amer: >60 mL/min (ref >60)
GLUCOSE: 112 mg/dL — AB (ref 65–99)
POTASSIUM: 4.7 mmol/L (ref 3.5–5.1)
Sodium: 135 mmol/L (ref 135–145)

## 2016-09-24 LAB — HEMOGLOBIN A1C
Hgb A1c MFr Bld: 5.1 % (ref 4.8–5.6)
Mean Plasma Glucose: 100 mg/dL

## 2016-09-24 MED ORDER — ORAL CARE MOUTH RINSE
15.0000 mL | Freq: Two times a day (BID) | OROMUCOSAL | Status: DC
  Administered 2016-09-24: 15 mL via OROMUCOSAL

## 2016-09-24 MED ORDER — SODIUM CHLORIDE 0.9 % IV BOLUS (SEPSIS)
250.0000 mL | Freq: Once | INTRAVENOUS | Status: AC
  Administered 2016-09-24: 250 mL via INTRAVENOUS

## 2016-09-24 MED ORDER — CARVEDILOL 3.125 MG PO TABS
1.5625 mg | ORAL_TABLET | Freq: Two times a day (BID) | ORAL | Status: DC
  Administered 2016-09-25 – 2016-09-26 (×2): 1.5625 mg via ORAL
  Filled 2016-09-24 (×3): qty 1

## 2016-09-24 NOTE — Progress Notes (Signed)
Patient C/O 5/10 left-sided chest pain and nausea.  BP 90/71.  EKG obtained.  Dr. Anne FuSkains notified. [place], Mitzi HansenJessica Marie

## 2016-09-24 NOTE — Progress Notes (Signed)
Patient's BP 80/53 in right arm, asymptomatic. BP 96/64 in left arm.  Cards Fellow notified and PM dose of Coreg held.  All other VSS. Will continue to monitor. [place], Mitzi HansenJessica Marie

## 2016-09-24 NOTE — Progress Notes (Signed)
EKG CRITICAL VALUE     12 lead EKG performed.  Critical value noted.  Shanda BumpsJessica, RN notified.   Wandalee FerdinandKimberly Logan, CCT 09/24/2016 9:15 AM

## 2016-09-24 NOTE — Progress Notes (Signed)
Subjective: Dizzy earlier   No SOB  No CP   Objective: Vitals:   09/24/16 1000 09/24/16 1030 09/24/16 1100 09/24/16 1200  BP: (!) 77/56 97/67 (!) 88/65 95/79  Pulse: 75 74 76 77  Resp: (!) 24 13 (!) 9 (!) 9  Temp:      TempSrc:      SpO2: 100% 96% 96% 98%  Weight:      Height:       Weight change:   Intake/Output Summary (Last 24 hours) at 09/24/16 1311 Last data filed at 09/24/16 1200  Gross per 24 hour  Intake              773 ml  Output              500 ml  Net              273 ml    General: Alert, awake, oriented x3, in no acute distress Neck:  JVP is normal Heart: Regular rate and rhythm, without murmurs, rubs, gallops.  Lungs: Clear to auscultation.  No rales or wheezes. Exemities:  No edema.   Neuro: Grossly intact, nonfocal.  Tele   SR    Lab Results: Results for orders placed or performed during the hospital encounter of 09/23/16 (from the past 24 hour(s))  Troponin I (q 6hr x 3)     Status: Abnormal   Collection Time: 09/23/16  2:36 PM  Result Value Ref Range   Troponin I >65.00 (HH) <0.03 ng/mL  Basic metabolic panel     Status: Abnormal   Collection Time: 09/23/16  2:36 PM  Result Value Ref Range   Sodium 135 135 - 145 mmol/L   Potassium 3.7 3.5 - 5.1 mmol/L   Chloride 105 101 - 111 mmol/L   CO2 25 22 - 32 mmol/L   Glucose, Bld 133 (H) 65 - 99 mg/dL   BUN 8 6 - 20 mg/dL   Creatinine, Ser 1.610.64 0.44 - 1.00 mg/dL   Calcium 8.6 (L) 8.9 - 10.3 mg/dL   GFR calc non Af Amer >60 >60 mL/min   GFR calc Af Amer >60 >60 mL/min   Anion gap 5 5 - 15  Troponin I (q 6hr x 3)     Status: Abnormal   Collection Time: 09/23/16  8:39 PM  Result Value Ref Range   Troponin I >65.00 (HH) <0.03 ng/mL  Basic metabolic panel     Status: Abnormal   Collection Time: 09/24/16  2:28 AM  Result Value Ref Range   Sodium 135 135 - 145 mmol/L   Potassium 4.7 3.5 - 5.1 mmol/L   Chloride 102 101 - 111 mmol/L   CO2 28 22 - 32 mmol/L   Glucose, Bld 112 (H) 65 - 99 mg/dL   BUN 7 6 - 20 mg/dL   Creatinine, Ser 0.960.85 0.44 - 1.00 mg/dL   Calcium 8.9 8.9 - 04.510.3 mg/dL   GFR calc non Af Amer >60 >60 mL/min   GFR calc Af Amer >60 >60 mL/min   Anion gap 5 5 - 15  CBC     Status: Abnormal   Collection Time: 09/24/16  2:28 AM  Result Value Ref Range   WBC 11.2 (H) 4.0 - 10.5 K/uL   RBC 3.86 (L) 3.87 - 5.11 MIL/uL   Hemoglobin 11.8 (L) 12.0 - 15.0 g/dL   HCT 40.934.6 (L) 81.136.0 - 91.446.0 %   MCV 89.6 78.0 - 100.0 fL   MCH 30.6 26.0 -  34.0 pg   MCHC 34.1 30.0 - 36.0 g/dL   RDW 40.912.3 81.111.5 - 91.415.5 %   Platelets 208 150 - 400 K/uL    Studies/Results: No results found.  Medications: Reviewed     @PROBHOSP @  1  CAD  Anterior  MI   S/p cath  On 12/30 Conclusions: 1. Anterior STEMI with 99% mid LAD stenosis with TIMI-2 flow. 2. Mild to moderate, non-obstructive coronary artery disease involving the ostial and distal LAD, ostial and mid LCx, and mid/distal RCA. 3. Severely reduced left ventricular contraction with anterior, apical, and apical inferior akinesis.  LVEF 25-35%. 4. Moderately increased left ventricular filling pressure (LVEDP 27 mmHg).   Pt underwent PROMUS stenting ot mid LAD   REcomm DAPT for at least 12 months     Coreg initiated yesterday held this am due t olow BP  Will cut in 1/2   Did not initiate ACEI yet   Would get echo prior to d/c   2.  HL  High dose lipitor   3  Tob  Pt says she has quit    Keep here today  Slowly increase activity tomorrow  INitiate rehab.  Dietrich PatesPaula Sierrah Luevano    LOS: 1 day   Dietrich Patesaula Carmyn Hamm 09/24/2016, 1:11 PM

## 2016-09-24 NOTE — Progress Notes (Signed)
Patient's SBP 77-68. Patient is awake. Skin warm and dry. Denies chest pain, dizziness, or shortness of breath. Right femoral site soft and without changes. Dr. Vonzella NippleWosik (Cardiology fellow) notified and order received. Will monitor.

## 2016-09-25 NOTE — Progress Notes (Signed)
Cardiology Fellow notified for low SBP ranging 82-90.  Although asymptomatic, pt c/o unresolved back pain (chronic condition) after 10mg  hydrocodone and heat application.  Pressures unable to support PRN morphine.  Instructed to give addition dose of hydrocodone.  Frutoso ChaseKristen M Luci Bellucci, RN

## 2016-09-25 NOTE — Progress Notes (Signed)
Patient Name: Crystal Roberson Date of Encounter: 09/25/2016  Primary Cardiologist: End  Hospital Problem List     Principal Problem:   STEMI involving left anterior descending coronary artery [facility])     Subjective   Chronic complaints of back pain on narcotics.   Inpatient Medications    Scheduled Meds: . aspirin  81 mg Oral Daily  . atorvastatin  80 mg Oral q1800  . buPROPion  150 mg Oral Daily  . carvedilol  1.5625 mg Oral BID WC  . enoxaparin (LOVENOX) injection  40 mg Subcutaneous Q24H  . FLUoxetine  40 mg Oral Daily  . sodium chloride flush  3 mL Intravenous Q12H  . sodium chloride flush  3 mL Intravenous Q12H  . ticagrelor  90 mg Oral BID   Continuous Infusions: . sodium chloride     PRN Meds: sodium chloride, sodium chloride, acetaminophen, HYDROcodone-acetaminophen, lidocaine, morphine injection, nitroGLYCERIN, ondansetron (ZOFRAN) IV, sodium chloride flush, sodium chloride flush, tiZANidine, zolpidem   Vital Signs    Vitals:   09/25/16 0700 09/25/16 0759 09/25/16 0800 09/25/16 0900  BP: (!) 99/57  99/65 95/64  Pulse: 78  81 82  Resp: 14  (!) 25 14  Temp:  98.7 F (37.1 C)    TempSrc:  Oral    SpO2: 96%  99% 98%  Weight:      Height:        Intake/Output Summary (Last 24 hours) at 09/25/16 1012 Last data filed at 09/25/16 0900  Gross per 24 hour  Intake              590 ml  Output              750 ml  Net             -160 ml   Filed Weights   09/23/16 0800  Weight: 135 lb 9.3 oz (61.5 kg)    Physical Exam    GEN: Well nourished, well developed, in no acute distress.  HEENT: Grossly normal.  Neck: Supple, no JVD, carotid bruits, or masses. Cardiac: RRR, no murmurs, rubs, or gallops. No clubbing, cyanosis, edema.  Radials/DP/PT 2+ and equal bilaterally.  Respiratory:  Respirations regular and unlabored, clear to auscultation bilaterally. GI: Soft, nontender, nondistended, BS + x 4. MS: no deformity or atrophy. Skin: warm and dry, no  rash. Neuro:  Strength and sensation are intact. Psych: AAOx3.  Normal affect.  Labs    CBC  Recent Labs  09/23/16 0608 09/24/16 0228  WBC 13.6* 11.2*  NEUTROABS 7.9*  --   HGB 14.6 11.8*  HCT 41.8 34.6*  MCV 88.7 89.6  PLT 230 208   Basic Metabolic Panel  Recent Labs  09/23/16 1436 09/24/16 0228  NA 135 135  K 3.7 4.7  CL 105 102  CO2 25 28  GLUCOSE 133* 112*  BUN 8 7  CREATININE 0.64 0.85  CALCIUM 8.6* 8.9   Liver Function Tests  Recent Labs  09/23/16 0608  AST 26  ALT 17  ALKPHOS 118  BILITOT 0.3  PROT 6.8  ALBUMIN 3.6   No results for input(s): LIPASE, AMYLASE in the last 72 hours. Cardiac Enzymes  Recent Labs  09/23/16 0608 09/23/16 1436 09/23/16 2039  TROPONINI 0.07* >65.00* >65.00*   BNP Invalid input(s): POCBNP D-Dimer No results for input(s): DDIMER in the last 72 hours. Hemoglobin A1C  Recent Labs  09/23/16 0608  HGBA1C 5.1   Fasting Lipid Panel  Recent Labs  09/23/16  0609  CHOL 240*  HDL 53  LDLCALC 156*  TRIG 153*  CHOLHDL 4.5   Thyroid Function Tests No results for input(s): TSH, T4TOTAL, T3FREE, THYROIDAB in the last 72 hours.  Invalid input(s): FREET3  Telemetry    No adverse rhythms - Personally Reviewed  ECG    Previously anterior ST elevations. Evolution of biphasic T-wave anterior precordial leads. - Personally Reviewed  Radiology    Dg Chest Port 1 View  Result Date: 09/23/2016 CLINICAL DATA:  Chest pain, nausea, vomiting and shortness of breath. EXAM: PORTABLE CHEST 1 VIEW COMPARISON:  09/12/2016 FINDINGS: The heart size and mediastinal contours are within normal limits. There is no evidence of pulmonary edema, consolidation, pneumothorax, nodule or pleural fluid. The visualized skeletal structures are unremarkable. IMPRESSION: No active disease. Electronically Signed   By: Irish LackGlenn  Yamagata M.D.   On: 09/23/2016 10:47    Cardiac Studies   CATH: Conclusions: 1. Anterior STEMI with 99% mid LAD  stenosis with TIMI-2 flow. 2. Mild to moderate, non-obstructive coronary artery disease involving the ostial and distal LAD, ostial and mid LCx, and mid/distal RCA. 3. Severely reduced left ventricular contraction with anterior, apical, and apical inferior akinesis. LVEF 25-35%. 4. Moderately increased left ventricular filling pressure (LVEDP 27 mmHg).  Recommendations: 1. Dual antiplatelet therapy for at least 12 months. 2. Discontinue cangrelor 2 hours after ticagrelor 180 mg was given. 3. Aggressive secondary prevention, including high-intensity statin therapy and tobacco cessation. 4. Replete potassium and recheck BMP at 1500. 5. Consider gentle hydration once potassium has been adequately repleted. 6. Initiate carvedilol 3.125 mg BID; add lisinopril as blood pressure allows. 7. Remove right femoral artery sheath once ACT has fallen below 175 seconds.  Yvonne Kendallhristopher End, MD  Patient Profile     51 year old anterior STEMI, Promus DES to LAD  Assessment & Plan    STEMI  - DAPT 12 Months  - High-dose atorvastatin  - Limited echocardiogram ordered  - Low-dose beta blocker as tolerated. Extremely low-dose. Quite hypotensive at baseline. She has had dizziness at home, near syncope.  - Cardiac revealed dilatation  Acute systolic heart failure/ischemic cardiomyopathy  - EF 25-35%, EDP 27 mmHg on cath.  - Continue try to optimize heart failure regimen but troublesome given her hypotension.  - No evidence of hypoxia currently. Breathing comfortably laying flat.  Chronic back pain  - Morphine as needed, by mouth medication as well.  Tobacco use  - Cessation discussed  Disposition  - We should try to consider discharge tomorrow if she remains electrically stable and clinically stable.  Signed, Donato SchultzMark Surie Suchocki, MD  09/25/2016, 10:12 AM

## 2016-09-26 ENCOUNTER — Telehealth: Payer: Self-pay | Admitting: Family Medicine

## 2016-09-26 ENCOUNTER — Ambulatory Visit: Payer: Self-pay | Admitting: Family Medicine

## 2016-09-26 ENCOUNTER — Encounter (HOSPITAL_COMMUNITY): Payer: Self-pay | Admitting: Internal Medicine

## 2016-09-26 ENCOUNTER — Other Ambulatory Visit: Payer: Self-pay | Admitting: Physician Assistant

## 2016-09-26 DIAGNOSIS — Z720 Tobacco use: Secondary | ICD-10-CM

## 2016-09-26 DIAGNOSIS — I5021 Acute systolic (congestive) heart failure: Secondary | ICD-10-CM

## 2016-09-26 DIAGNOSIS — I5041 Acute combined systolic (congestive) and diastolic (congestive) heart failure: Secondary | ICD-10-CM

## 2016-09-26 DIAGNOSIS — I255 Ischemic cardiomyopathy: Secondary | ICD-10-CM

## 2016-09-26 LAB — POCT ACTIVATED CLOTTING TIME: Activated Clotting Time: 323 seconds

## 2016-09-26 MED ORDER — ASPIRIN 81 MG PO CHEW: 81.0000 mg | CHEWABLE_TABLET | Freq: Every day | ORAL | 11 refills | Status: DC

## 2016-09-26 MED ORDER — NITROGLYCERIN 0.4 MG SL SUBL: 0.4000 mg | SUBLINGUAL_TABLET | SUBLINGUAL | 12 refills | Status: DC | PRN

## 2016-09-26 MED ORDER — ATORVASTATIN CALCIUM 80 MG PO TABS: 80.0000 mg | ORAL_TABLET | Freq: Every day | ORAL | 6 refills | Status: DC

## 2016-09-26 MED ORDER — CARVEDILOL 3.125 MG PO TABS: 1.5625 mg | ORAL_TABLET | Freq: Two times a day (BID) | ORAL | 6 refills | Status: DC

## 2016-09-26 MED ORDER — TICAGRELOR 90 MG PO TABS: 90.0000 mg | ORAL_TABLET | Freq: Two times a day (BID) | ORAL | 11 refills | Status: DC

## 2016-09-26 NOTE — Telephone Encounter (Signed)
Are you able to see if patient was discharged from hospital?

## 2016-09-26 NOTE — Progress Notes (Signed)
CARDIAC REHAB PHASE I   PRE:  Rate/Rhythm: 76 SR    BP: sitting 119/84    SaO2:   MODE:  Ambulation: 350 ft   POST:  Rate/Rhythm: 102 ST    BP: sitting 122/101     SaO2:   Pt slow due to chronic pain. She did increase speed slightly toward end and did st she was slightly SOB.  Overall tolerated fairly well. Sts she sometimes has jittery feeling in her chest. BP elevated after walking. Ed completed including importance of Brilinta, smoking cessation, low sodium, daily wts. She plans to quit smoking, accepted resources and fake cigarette. Understands importance of Brilinta. Interested in CRPII if she can get financial assistance. Will send referral to [place].  [phone]  Harriet MassonRandi Kristan Etienne Mowers CES, ACSM 09/26/2016 10:45 AM

## 2016-09-26 NOTE — Telephone Encounter (Signed)
Patient is in hospital

## 2016-09-26 NOTE — Telephone Encounter (Signed)
FYI - Pt mom called and stated that pt had a heartache.

## 2016-09-26 NOTE — Discharge Summary (Signed)
Discharge Summary    Patient ID: Crystal Roberson,  MRN: [id number], DOB/AGE: 1966-06-23 51 y.o.  Admit date: 09/23/2016 Discharge date: 09/26/2016  Primary Care Provider: Tommie Sams Primary Cardiologist: New to Dr. Okey Dupre  Discharge Diagnoses    Principal Problem:   STEMI involving left anterior descending coronary artery [facility]) Active Problems:   Chronic pain   Nicotine dependence   Acute combined systolic and diastolic heart failure (HCC)   Allergies Allergies  Allergen Reactions  . Pregabalin Shortness Of Breath and Swelling    Other reaction(s): Tachycardia (finding)  . Codeine     Other reaction(s): Unknown .    Diagnostic Studies/Procedures    Cath 09/23/16 Conclusions: 1. Anterior STEMI with 99% mid LAD stenosis with TIMI-2 flow. 2. Mild to moderate, non-obstructive coronary artery disease involving the ostial and distal LAD, ostial and mid LCx, and mid/distal RCA. 3. Severely reduced left ventricular contraction with anterior, apical, and apical inferior akinesis.  LVEF 25-35%. 4. Moderately increased left ventricular filling pressure (LVEDP 27 mmHg).  Recommendations: 1. Dual antiplatelet therapy for at least 12 months. 2. Discontinue cangrelor 2 hours after ticagrelor 180 mg was given. 3. Aggressive secondary prevention, including high-intensity statin therapy and tobacco cessation. 4. Replete potassium and recheck BMP at 1500. 5. Consider gentle hydration once potassium has been adequately repleted. 6. Initiate carvedilol 3.125 mg BID; add lisinopril as blood pressure allows. 7. Remove right femoral artery sheath once ACT has fallen below 175 seconds.   History of Present Illness     51 year old female with history of chronic back pain, tobacco abuse, HTN, recent admission for syncopal event presenting as a code STEMI 09/23/16  Patient acutely awoke early this AM ~5 AM with chest and back pain associated with nausea and vomiting.  EMS arrived and  activated STEMI code.  EMS ECG with significant artifact (from tremor), repeat immediately upon arrival more reflective of anterior STEMI and patient immediately taken to the cath lab.    Chest pain 10/10 with N/V, chills.    Recent syncopal event thought likely due to orthostatic hypotension given + orthostatic vital signs. Echo showed normal EF of 60-65%, no wm abnormality.   EMS: 324 ASA given.   Heparin bolus given in the ED.    Hospital Course     Consultants: None  1. STEMI  - Pt was taken for emergent left heart catheterization that showed 99% mild LAD s/p DES. Mild to moderate, non-obstructive coronary artery disease involving the ostial and distal LAD, ostial and mid LCx, and mid/distal RCA. EF of 25-35% by cath. Continue DAPT.  Limited echocardiogram will be completed as outpatient.  Low-dose beta blocker as tolerated. Extremely low-dose. Quite hypotensive at baseline. She has had dizziness at home, near syncope. Cardiac revealed dilatation.  2. Acute systolic heart failure/ischemic cardiomyopathy  - EF 25-35%, EDP 27 mmHg on cath. Continue try to optimize heart failure regimen but troublesome given her hypotension. Breathing comfortably laying flat. Hold Lisinoprol/HCTZ during admission.  3. Hypokalemia - She was hypokalemia at presentation. Supplement given by IV. Resolved. Hold home Kdur held at discharge given holding of diuretics. Will need BMET during TCM.    4. Chronic back pain  - Morphine as needed, by mouth medication as well.  5. Tobacco use  - Cessation discussed   The patient has been seen by Dr. Anne Fu today and deemed ready for discharge home. All follow-up appointments have been scheduled. Discharge medications are listed below.  Discharge Vitals Blood pressure 116/83, pulse 81, temperature 98.4 F (36.9 C), temperature source Oral, resp. rate 18, height 5\' 6"  (1.676 m), weight 135 lb 9.3 oz (61.5 kg), SpO2 99 %.  Filed Weights   09/23/16 0800    Weight: 135 lb 9.3 oz (61.5 kg)    Labs & Radiologic Studies     CBC  Recent Labs  09/24/16 0228  WBC 11.2*  HGB 11.8*  HCT 34.6*  MCV 89.6  PLT 208   Basic Metabolic Panel  Recent Labs  09/23/16 1436 09/24/16 0228  NA 135 135  K 3.7 4.7  CL 105 102  CO2 25 28  GLUCOSE 133* 112*  BUN 8 7  CREATININE 0.64 0.85  CALCIUM 8.6* 8.9   Liver Function Tests No results for input(s): AST, ALT, ALKPHOS, BILITOT, PROT, ALBUMIN in the last 72 hours. No results for input(s): LIPASE, AMYLASE in the last 72 hours. Cardiac Enzymes  Recent Labs  09/23/16 1436 09/23/16 2039  TROPONINI >65.00* >65.00*   BNP Invalid input(s): POCBNP D-Dimer No results for input(s): DDIMER in the last 72 hours. Hemoglobin A1C No results for input(s): HGBA1C in the last 72 hours. Fasting Lipid Panel No results for input(s): CHOL, HDL, LDLCALC, TRIG, CHOLHDL, LDLDIRECT in the last 72 hours. Thyroid Function Tests No results for input(s): TSH, T4TOTAL, T3FREE, THYROIDAB in the last 72 hours.  Invalid input(s): FREET3  Dg Chest 2 View  Result Date: 09/12/2016 CLINICAL DATA:  Shortness of breath, hypertension. EXAM: CHEST  2 VIEW COMPARISON:  Radiographs of December 16, 2005. FINDINGS: The heart size and mediastinal contours are within normal limits. Both lungs are clear. No pneumothorax or pleural effusion is noted. The visualized skeletal structures are unremarkable. IMPRESSION: No active cardiopulmonary disease. Electronically Signed   By: Lupita Raider, M.D.   On: 09/12/2016 11:50   Dg Chest Port 1 View  Result Date: 09/23/2016 CLINICAL DATA:  Chest pain, nausea, vomiting and shortness of breath. EXAM: PORTABLE CHEST 1 VIEW COMPARISON:  09/12/2016 FINDINGS: The heart size and mediastinal contours are within normal limits. There is no evidence of pulmonary edema, consolidation, pneumothorax, nodule or pleural fluid. The visualized skeletal structures are unremarkable. IMPRESSION: No active  disease. Electronically Signed   By: Irish Lack M.D.   On: 09/23/2016 10:47    Disposition   Pt is being discharged home today in good condition.  Follow-up Plans & Appointments    Follow-up Information    Yvonne Kendall, MD. Go on 10/02/2016.   Specialty:  Cardiology Why:  @9am  for TCM Contact information: [address] [place] [address] [phone]        [facility] [employer]. Go on 10/10/2016.   Specialty:  Cardiology Why:  for limited echocardiogram @ 1pm Contact information: [address], [address] [place] [address] [phone]         Discharge Instructions    Diet - low sodium heart healthy    Complete by:  As directed    Discharge instructions    Complete by:  As directed    No driving for 1 week. No lifting over 10 lbs for 4 weeks. No sexual activity for 4 weeks. You may not return to work until cleared by your cardiologist after office visit 10/03/15. Keep procedure site clean & dry. If you notice increased pain, swelling, bleeding or pus, call/return!  You may shower, but no soaking baths/hot tubs/pools for 1 week.   Increase activity  slowly    Complete by:  As directed       Discharge Medications   Current Discharge Medication List    START taking these medications   Details  aspirin 81 MG chewable tablet Chew 1 tablet (81 mg total) by mouth daily. Qty: 30 tablet, Refills: 11    atorvastatin (LIPITOR) 80 MG tablet Take 1 tablet (80 mg total) by mouth daily at 6 PM. Qty: 30 tablet, Refills: 6    carvedilol (COREG) 3.125 MG tablet Take 0.5 tablets (1.5625 mg total) by mouth 2 (two) times daily with a meal. Qty: 30 tablet, Refills: 6    nitroGLYCERIN (NITROSTAT) 0.4 MG SL tablet Place 1 tablet (0.4 mg total) under the tongue every 5 (five) minutes as needed for chest pain (CP or SOB). Qty: 25 tablet, Refills: 12    ticagrelor (BRILINTA) 90 MG TABS tablet Take 1 tablet (90 mg total) by mouth 2  (two) times daily. Qty: 60 tablet, Refills: 11      CONTINUE these medications which have NOT CHANGED   Details  buPROPion (WELLBUTRIN XL) 150 MG 24 hr tablet Take 1 tablet (150 mg total) by mouth daily. Qty: 30 tablet, Refills: 0    FLUoxetine (PROZAC) 20 MG capsule Take 2 capsules (40 mg total) by mouth daily. Qty: 180 capsule, Refills: 1    HYDROcodone-acetaminophen (NORCO/VICODIN) 5-325 MG tablet Limit 1-3 tablets po daily if tolerated for breakthrough pain while taking tramadol Qty: 90 tablet, Refills: 0    lidocaine (LIDODERM) 5 % Place 1 patch onto the skin daily. Apply 1-3 patches to skin  for 12 hours then Remove patches for 12 hours. Repeat process if tolerated Qty: 90 patch, Refills: 2    tiZANidine (ZANAFLEX) 2 MG tablet Limit 1 tablet by mouth per day or 2 tablets by mouth per day if tolerated Qty: 50 tablet, Refills: 0    traMADol (ULTRAM) 50 MG tablet Limit 2-4 tablets po daily if tolerated Qty: 120 tablet, Refills: 0    zolpidem (AMBIEN) 10 MG tablet Take 1 tablet (10 mg total) by mouth at bedtime as needed for sleep. Qty: 30 tablet, Refills: 0      STOP taking these medications     lisinopril-hydrochlorothiazide (PRINZIDE,ZESTORETIC) 20-25 MG tablet      potassium chloride SA (K-DUR,KLOR-CON) 20 MEQ tablet          Aspirin prescribed at discharge?  Yes High Intensity Statin Prescribed? (Lipitor 40-80mg  or Crestor 20-40mg ): Yes Beta Blocker Prescribed? Yes For EF 45% or less, Was ACEI/ARB Prescribed? Hold due to hypotension ADP Receptor Inhibitor Prescribed? (i.e. Plavix etc.-Includes Medically Managed Patients): Yes For EF <40%, Aldosterone Inhibitor Prescribed? N/A Was EF assessed during THIS hospitalization? Pending as outpatient Was Cardiac Rehab II ordered? (Included Medically managed Patients): Yes   Outstanding Labs/Studies   BMET at TCM due to hypokalemia Consider OP f/u labs 6-8 weeks given statin initiation this admission.  Duration of  Discharge Encounter   Greater than 30 minutes including physician time.  Signed, Bhagat,Bhavinkumar PA-C 09/26/2016, 10:43 AM  Personally seen and examined. Agree with above. Primary Cardiologist: End  Hospital Problem List     Principal Problem:   STEMI involving left anterior descending coronary artery (HCC)     Subjective   Chronic complaints of back pain. Mild nausea this am. No SOB, ambulating.   Inpatient Medications    Scheduled Meds: . aspirin  81 mg Oral Daily  . atorvastatin  80 mg Oral q1800  . buPROPion  150  mg Oral Daily  . carvedilol  1.5625 mg Oral BID WC  . enoxaparin (LOVENOX) injection  40 mg Subcutaneous Q24H  . FLUoxetine  40 mg Oral Daily  . sodium chloride flush  3 mL Intravenous Q12H  . sodium chloride flush  3 mL Intravenous Q12H  . ticagrelor  90 mg Oral BID   Continuous Infusions: . sodium chloride     PRN Meds: sodium chloride, sodium chloride, acetaminophen, HYDROcodone-acetaminophen, lidocaine, morphine injection, nitroGLYCERIN, ondansetron (ZOFRAN) IV, sodium chloride flush, sodium chloride flush, tiZANidine, zolpidem   Vital Signs          Vitals:   09/26/16 0500 09/26/16 0600 09/26/16 0700 09/26/16 0750  BP: 93/67 (!) 87/64 112/68   Pulse: 79 72 84   Resp: 16 13 13    Temp:    98.4 F (36.9 C)  TempSrc:    Oral  SpO2: 97% 98% 96%   Weight:      Height:        Intake/Output Summary (Last 24 hours) at 09/26/16 0842 Last data filed at 09/25/16 2128  Gross per 24 hour  Intake              475 ml  Output                0 ml  Net              475 ml      Filed Weights   09/23/16 0800  Weight: 135 lb 9.3 oz (61.5 kg)    Physical Exam    GEN: Well nourished, well developed, in no acute distress.  HEENT: Grossly normal.  Neck: Supple, no JVD, carotid bruits, or masses. Cardiac: RRR, no murmurs, rubs, or gallops. No clubbing, cyanosis, edema.  Radials/DP/PT 2+ and equal bilaterally.    Respiratory:  Respirations regular and unlabored, clear to auscultation bilaterally. GI: Soft, nontender, nondistended, BS + x 4. MS: no deformity or atrophy. Skin: warm and dry, no rash. Cath site, resolving hematoma right groin.  Neuro:  Strength and sensation are intact. Psych: AAOx3.  Normal affect.  Labs    CBC  Recent Labs (last 2 labs)    Recent Labs  09/24/16 0228  WBC 11.2*  HGB 11.8*  HCT 34.6*  MCV 89.6  PLT 208     Basic Metabolic Panel  Recent Labs (last 2 labs)    Recent Labs  09/23/16 1436 09/24/16 0228  NA 135 135  K 3.7 4.7  CL 105 102  CO2 25 28  GLUCOSE 133* 112*  BUN 8 7  CREATININE 0.64 0.85  CALCIUM 8.6* 8.9     Liver Function Tests Recent Labs (last 2 labs)   No results for input(s): AST, ALT, ALKPHOS, BILITOT, PROT, ALBUMIN in the last 72 hours.   Recent Labs (last 2 labs)   No results for input(s): LIPASE, AMYLASE in the last 72 hours.   Cardiac Enzymes  Recent Labs (last 2 labs)    Recent Labs  09/23/16 1436 09/23/16 2039  TROPONINI >65.00* >65.00*     BNP Recent Labs (last 2 labs)   Invalid input(s): POCBNP   D-Dimer Recent Labs (last 2 labs)   No results for input(s): DDIMER in the last 72 hours.   Hemoglobin A1C Recent Labs (last 2 labs)   No results for input(s): HGBA1C in the last 72 hours.   Fasting Lipid Panel Recent Labs (last 2 labs)   No results for input(s): CHOL, HDL, LDLCALC, TRIG, CHOLHDL,  LDLDIRECT in the last 72 hours.   Thyroid Function Tests  Recent Labs (last 2 labs)   No results for input(s): TSH, T4TOTAL, T3FREE, THYROIDAB in the last 72 hours.  Invalid input(s): FREET3    Telemetry    No adverse rhythms - Personally Reviewed  ECG    Previously anterior ST elevations. Evolution of biphasic T-wave anterior precordial leads. - Personally Reviewed  Radiology    Imaging Results (Last 48 hours)  No results found.    Cardiac Studies    CATH: Conclusions: 1. Anterior STEMI with 99% mid LAD stenosis with TIMI-2 flow. 2. Mild to moderate, non-obstructive coronary artery disease involving the ostial and distal LAD, ostial and mid LCx, and mid/distal RCA. 3. Severely reduced left ventricular contraction with anterior, apical, and apical inferior akinesis. LVEF 25-35%. 4. Moderately increased left ventricular filling pressure (LVEDP 27 mmHg).  Recommendations: 1. Dual antiplatelet therapy for at least 12 months. 2. Discontinue cangrelor 2 hours after ticagrelor 180 mg was given. 3. Aggressive secondary prevention, including high-intensity statin therapy and tobacco cessation. 4. Replete potassium and recheck BMP at 1500. 5. Consider gentle hydration once potassium has been adequately repleted. 6. Initiate carvedilol 3.125 mg BID; add lisinopril as blood pressure allows. 7. Remove right femoral artery sheath once ACT has fallen below 175 seconds.  Yvonne Kendallhristopher End, MD  Patient Profile     51 year old anterior STEMI, Promus DES to LAD  Assessment & Plan    STEMI  - DAPT 12 Months  - High-dose atorvastatin  - Limited echocardiogram will be completed as outpatient.   - Low-dose beta blocker as tolerated. Extremely low-dose. Quite hypotensive at baseline. She has had dizziness at home, near syncope.  - Cardiac revealed dilatation  Acute systolic heart failure/ischemic cardiomyopathy  - EF 25-35%, EDP 27 mmHg on cath.  - Continue try to optimize heart failure regimen but troublesome given her hypotension.  - No evidence of hypoxia currently. Breathing comfortably laying flat.  Chronic back pain  - Morphine as needed, by mouth medication as well.  Tobacco use  - Cessation discussed  Disposition  - discharge home. Cardiac rehab. Meds reviewed.   Signed, Donato SchultzMark Skains, MD

## 2016-09-26 NOTE — Care Management Note (Signed)
Case Management Note  Patient Details  Name: Ladell Piermy D Grenda MRN: [id number] Date of Birth: December 01, 1965  Subjective/Objective:     Adm w mi               Action/Plan:lives w fam, pcp dr Adriana Simascook  Expected Discharge Date:                  Expected Discharge Plan:  Home/Self Care  In-House Referral:     Discharge planning Services  CM Consult, Medication Assistance  Post Acute Care Choice:    Choice offered to:     DME Arranged:    DME Agency:     HH Arranged:    HH Agency:     Status of Service:  Completed, signed off  If discussed at [employer] Length of Stay Meetings, dates discussed:    Additional Comments: gave pt 30day free brilinta card and enc pt to call 888 number to poss get few more months. Gave pt pt assist form for brilinta and she will take to cardiol office on next visit. No ins but does have pcp.  Hanley Haysowell, Raahil Ong T, RN 09/26/2016, 10:48 AM

## 2016-09-26 NOTE — Progress Notes (Signed)
Pt alert and oriented at time of discharge. Discharge instructions reviewed with patient and husband at bedside. All questions/concerns addressed and answered. PIV removed. Patient escorted to car in wheelchair.

## 2016-09-26 NOTE — Progress Notes (Signed)
Patient Name: Crystal Roberson Date of Encounter: 09/26/2016  Primary Cardiologist: End  Hospital Problem List     Principal Problem:   STEMI involving left anterior descending coronary artery [facility])     Subjective   Chronic complaints of back pain. Mild nausea this am. No SOB, ambulating.   Inpatient Medications    Scheduled Meds: . aspirin  81 mg Oral Daily  . atorvastatin  80 mg Oral q1800  . buPROPion  150 mg Oral Daily  . carvedilol  1.5625 mg Oral BID WC  . enoxaparin (LOVENOX) injection  40 mg Subcutaneous Q24H  . FLUoxetine  40 mg Oral Daily  . sodium chloride flush  3 mL Intravenous Q12H  . sodium chloride flush  3 mL Intravenous Q12H  . ticagrelor  90 mg Oral BID   Continuous Infusions: . sodium chloride     PRN Meds: sodium chloride, sodium chloride, acetaminophen, HYDROcodone-acetaminophen, lidocaine, morphine injection, nitroGLYCERIN, ondansetron (ZOFRAN) IV, sodium chloride flush, sodium chloride flush, tiZANidine, zolpidem   Vital Signs    Vitals:   09/26/16 0500 09/26/16 0600 09/26/16 0700 09/26/16 0750  BP: 93/67 (!) 87/64 112/68   Pulse: 79 72 84   Resp: 16 13 13    Temp:    98.4 F (36.9 C)  TempSrc:    Oral  SpO2: 97% 98% 96%   Weight:      Height:        Intake/Output Summary (Last 24 hours) at 09/26/16 0842 Last data filed at 09/25/16 2128  Gross per 24 hour  Intake              475 ml  Output                0 ml  Net              475 ml   Filed Weights   09/23/16 0800  Weight: 135 lb 9.3 oz (61.5 kg)    Physical Exam    GEN: Well nourished, well developed, in no acute distress.  HEENT: Grossly normal.  Neck: Supple, no JVD, carotid bruits, or masses. Cardiac: RRR, no murmurs, rubs, or gallops. No clubbing, cyanosis, edema.  Radials/DP/PT 2+ and equal bilaterally.  Respiratory:  Respirations regular and unlabored, clear to auscultation bilaterally. GI: Soft, nontender, nondistended, BS + x 4. MS: no deformity or atrophy. Skin:  warm and dry, no rash. Cath site, resolving hematoma right groin.  Neuro:  Strength and sensation are intact. Psych: AAOx3.  Normal affect.  Labs    CBC  Recent Labs  09/24/16 0228  WBC 11.2*  HGB 11.8*  HCT 34.6*  MCV 89.6  PLT 208   Basic Metabolic Panel  Recent Labs  09/23/16 1436 09/24/16 0228  NA 135 135  K 3.7 4.7  CL 105 102  CO2 25 28  GLUCOSE 133* 112*  BUN 8 7  CREATININE 0.64 0.85  CALCIUM 8.6* 8.9   Liver Function Tests No results for input(s): AST, ALT, ALKPHOS, BILITOT, PROT, ALBUMIN in the last 72 hours. No results for input(s): LIPASE, AMYLASE in the last 72 hours. Cardiac Enzymes  Recent Labs  09/23/16 1436 09/23/16 2039  TROPONINI >65.00* >65.00*   BNP Invalid input(s): POCBNP D-Dimer No results for input(s): DDIMER in the last 72 hours. Hemoglobin A1C No results for input(s): HGBA1C in the last 72 hours. Fasting Lipid Panel No results for input(s): CHOL, HDL, LDLCALC, TRIG, CHOLHDL, LDLDIRECT in the last 72 hours. Thyroid Function Tests No  results for input(s): TSH, T4TOTAL, T3FREE, THYROIDAB in the last 72 hours.  Invalid input(s): FREET3  Telemetry    No adverse rhythms - Personally Reviewed  ECG    Previously anterior ST elevations. Evolution of biphasic T-wave anterior precordial leads. - Personally Reviewed  Radiology    No results found.  Cardiac Studies   CATH: Conclusions: 1. Anterior STEMI with 99% mid LAD stenosis with TIMI-2 flow. 2. Mild to moderate, non-obstructive coronary artery disease involving the ostial and distal LAD, ostial and mid LCx, and mid/distal RCA. 3. Severely reduced left ventricular contraction with anterior, apical, and apical inferior akinesis. LVEF 25-35%. 4. Moderately increased left ventricular filling pressure (LVEDP 27 mmHg).  Recommendations: 1. Dual antiplatelet therapy for at least 12 months. 2. Discontinue cangrelor 2 hours after ticagrelor 180 mg was given. 3. Aggressive  secondary prevention, including high-intensity statin therapy and tobacco cessation. 4. Replete potassium and recheck BMP at 1500. 5. Consider gentle hydration once potassium has been adequately repleted. 6. Initiate carvedilol 3.125 mg BID; add lisinopril as blood pressure allows. 7. Remove right femoral artery sheath once ACT has fallen below 175 seconds.  Yvonne Kendallhristopher End, MD  Patient Profile     51 year old anterior STEMI, Promus DES to LAD  Assessment & Plan    STEMI  - DAPT 12 Months  - High-dose atorvastatin  - Limited echocardiogram will be completed as outpatient.   - Low-dose beta blocker as tolerated. Extremely low-dose. Quite hypotensive at baseline. She has had dizziness at home, near syncope.  - Cardiac revealed dilatation  Acute systolic heart failure/ischemic cardiomyopathy  - EF 25-35%, EDP 27 mmHg on cath.  - Continue try to optimize heart failure regimen but troublesome given her hypotension.  - No evidence of hypoxia currently. Breathing comfortably laying flat.  Chronic back pain  - Morphine as needed, by mouth medication as well.  Tobacco use  - Cessation discussed  Disposition  - discharge home. Cardiac rehab. Meds reviewed.   Signed, Donato SchultzMark Skains, MD  09/26/2016, 8:42 AM

## 2016-09-27 LAB — POCT I-STAT, CHEM 8
BUN: 8 mg/dL (ref 6–20)
CHLORIDE: 100 mmol/L — AB (ref 101–111)
Calcium, Ion: 1.11 mmol/L — ABNORMAL LOW (ref 1.15–1.40)
Creatinine, Ser: 0.6 mg/dL (ref 0.44–1.00)
Glucose, Bld: 173 mg/dL — ABNORMAL HIGH (ref 65–99)
HEMATOCRIT: 40 % (ref 36.0–46.0)
Hemoglobin: 13.6 g/dL (ref 12.0–15.0)
Potassium: 3.1 mmol/L — ABNORMAL LOW (ref 3.5–5.1)
SODIUM: 137 mmol/L (ref 135–145)
TCO2: 21 mmol/L (ref 0–100)

## 2016-10-02 ENCOUNTER — Encounter: Payer: Self-pay | Admitting: Internal Medicine

## 2016-10-02 ENCOUNTER — Ambulatory Visit (INDEPENDENT_AMBULATORY_CARE_PROVIDER_SITE_OTHER): Payer: Self-pay | Admitting: Internal Medicine

## 2016-10-02 ENCOUNTER — Encounter (INDEPENDENT_AMBULATORY_CARE_PROVIDER_SITE_OTHER): Payer: Self-pay

## 2016-10-02 VITALS — BP 118/80 | HR 84 | Ht 65.0 in | Wt 137.8 lb

## 2016-10-02 DIAGNOSIS — I10 Essential (primary) hypertension: Secondary | ICD-10-CM

## 2016-10-02 DIAGNOSIS — S301XXD Contusion of abdominal wall, subsequent encounter: Secondary | ICD-10-CM

## 2016-10-02 DIAGNOSIS — I2102 ST elevation (STEMI) myocardial infarction involving left anterior descending coronary artery: Secondary | ICD-10-CM

## 2016-10-02 DIAGNOSIS — I255 Ischemic cardiomyopathy: Secondary | ICD-10-CM

## 2016-10-02 DIAGNOSIS — E785 Hyperlipidemia, unspecified: Secondary | ICD-10-CM

## 2016-10-02 DIAGNOSIS — I2510 Atherosclerotic heart disease of native coronary artery without angina pectoris: Secondary | ICD-10-CM

## 2016-10-02 DIAGNOSIS — I5021 Acute systolic (congestive) heart failure: Secondary | ICD-10-CM

## 2016-10-02 MED ORDER — LISINOPRIL 2.5 MG PO TABS: 2.5000 mg | ORAL_TABLET | Freq: Every day | ORAL | 1 refills | Status: DC

## 2016-10-02 MED ORDER — CARVEDILOL 3.125 MG PO TABS: 3.1250 mg | ORAL_TABLET | Freq: Two times a day (BID) | ORAL | 1 refills | Status: DC

## 2016-10-02 NOTE — Progress Notes (Signed)
Follow-up Outpatient Visit Date: 10/02/2016  Chief Complaint: Shortness of breath following recent STEMI  HPI:  Crystal Roberson is a 51 y.o. year-old female with history of coronary artery disease status post anterior STEMI on 09/23/16, ischemic cardiomyopathy, hypertension, migraine headaches, degenerative disc disease with chronic back pain, and depression, who presents for follow-up of recent STEMI. The patient developed acute onset of chest pain on the morning of 09/23/16, with EKG demonstrating anterior ST segment elevation. She was found to have a 99% stenosis of the mid LAD with TIMI-2 flow, which was successfully treated with a single drug-eluting stent. LVEF was severely decreased at 25-35% with anterior, apical, and apical inferior akinesis. The patient's hospital course was complicated by bleeding from her right femoral arteriotomy site as well as low blood pressures limiting titration of evidence-based heart failure therapy. Since leaving the hospital, the patient has not had any further chest pain. She notes some exertional dyspnea but has tried to remain active. She has also experienced orthopnea from time to time, now sleeping on 2 pillows and on one occasion having to sleep in a recliner. She denies lower extremity edema. She also denies lightheadedness and PND. She notes that her heart seems to be rapidly when she is exerting herself, though she has not had palpitations otherwise. She has tenderness overlying the right groin but denies more distal lower extremity pain, as well as bleeding. She remains on her prescribed medications including dual antiplatelet therapy with aspirin and ticagrelor. She has not had any significant bleeding. Crystal Roberson has been checking her blood pressure at home and notes that it is typically in the 110-130/70-80 range. The patient has not smoked since leaving the  hospital.  --------------------------------------------------------------------------------------------------  Cardiovascular History & Procedures: Cardiovascular Problems:  Coronary artery disease status post anterior STEMI (09/23/16)  Ischemic cardiomyopathy with severe LV systolic dysfunction  Risk Factors:  Known coronary artery disease, hypertension, and tobacco use  Cath/PCI:  LHC/PCI (09/23/16): Anterior STEMI with 99% mid LAD stenosis with TIMI 2 flow. Mild to moderate, nonobstructive CAD involving the ostial and distal LAD, ostial and mid LCx, and mid/distal RCA. Severely reduced LV contraction (EF 25-35%) with anterior, apical, and apical inferior akinesis. Moderately elevated left ventricular filling pressure (LVEDP 27 mmHg).  CV Surgery:  None  EP Procedures and Devices:  None  Non-Invasive Evaluation(s):  Transthoracic echocardiogram (09/13/16): Normal LV size and contraction (EF 60-65%). Normal diastolic function. Trivial mitral and tricuspid regurgitation. Normal RV size and function.  Recent CV Pertinent Labs: Lab Results  Component Value Date   CHOL 240 (H) 09/23/2016   HDL 53 09/23/2016   LDLCALC 156 (H) 09/23/2016   TRIG 153 (H) 09/23/2016   CHOLHDL 4.5 09/23/2016   INR 0.97 09/23/2016   K 4.7 09/24/2016   K 3.4 (L) 01/04/2012   MG 1.9 09/14/2016   BUN 7 09/24/2016   BUN 8 01/04/2012   CREATININE 0.85 09/24/2016   CREATININE 0.75 01/04/2012    Past medical and surgical history were reviewed and updated in EPIC.   Outpatient Encounter Prescriptions as of 10/02/2016  Medication Sig  . aspirin 81 MG chewable tablet Chew 1 tablet (81 mg total) by mouth daily.  Marland Kitchen atorvastatin (LIPITOR) 80 MG tablet Take 1 tablet (80 mg total) by mouth daily at 6 PM.  . buPROPion (WELLBUTRIN XL) 150 MG 24 hr tablet Take 1 tablet (150 mg total) by mouth daily.  . carvedilol (COREG) 3.125 MG tablet Take 0.5 tablets (1.5625 mg total) by mouth 2 (  two) times daily with a  meal.  . FLUoxetine (PROZAC) 20 MG capsule Take 2 capsules (40 mg total) by mouth daily.  Marland Kitchen HYDROcodone-acetaminophen (NORCO/VICODIN) 5-325 MG tablet Limit 1-3 tablets po daily if tolerated for breakthrough pain while taking tramadol  . lidocaine (LIDODERM) 5 % Place 1 patch onto the skin daily. Apply 1-3 patches to skin  for 12 hours then Remove patches for 12 hours. Repeat process if tolerated  . nitroGLYCERIN (NITROSTAT) 0.4 MG SL tablet Place 1 tablet (0.4 mg total) under the tongue every 5 (five) minutes as needed for chest pain (CP or SOB).  . ticagrelor (BRILINTA) 90 MG TABS tablet Take 1 tablet (90 mg total) by mouth 2 (two) times daily.  Marland Kitchen tiZANidine (ZANAFLEX) 2 MG tablet Limit 1 tablet by mouth per day or 2 tablets by mouth per day if tolerated  . traMADol (ULTRAM) 50 MG tablet Limit 2-4 tablets po daily if tolerated  . zolpidem (AMBIEN) 10 MG tablet Take 1 tablet (10 mg total) by mouth at bedtime as needed for sleep.   No facility-administered encounter medications on file as of 10/02/2016.     Allergies: Pregabalin and Codeine  Social History   Social History  . Marital status: Divorced    Spouse name: N/A  . Number of children: N/A  . Years of education: N/A   Occupational History  . Not on file.   Social History Main Topics  . Smoking status: Former Smoker    Packs/day: 0.75    Years: 30.00    Types: Cigarettes    Quit date: 09/23/2016  . Smokeless tobacco: Never Used  . Alcohol use No  . Drug use: No  . Sexual activity: Not Currently   Other Topics Concern  . Not on file   Social History Narrative  . No narrative on file    Family History  Problem Relation Age of Onset  . Hypertension Mother   . Arthritis Mother   . Hyperlipidemia Mother   . Mental illness Mother   . Hypertension Maternal Grandmother   . Heart disease Maternal Grandmother     Review of Systems: A 12-system review of systems was performed and was negative except as noted in the  HPI.  --------------------------------------------------------------------------------------------------  Physical Exam: BP 118/80   Pulse 84   Ht 5\' 5"  (1.651 m)   Wt 137 lb 12.8 oz (62.5 kg)   LMP  (Within Years)   BMI 22.93 kg/m   General:  Well-developed, well-nourished woman seated comfortably in the exam room. She is accompanied by her husband. HEENT: No conjunctival pallor or scleral icterus.  Moist mucous membranes.  OP clear. Neck: Supple without lymphadenopathy, thyromegaly, JVD, or HJR. Lungs: Normal work of breathing.  Clear to auscultation bilaterally without wheezes or crackles. Heart: Regular rate and rhythm without murmurs, rubs, or gallops.  Non-displaced PMI. Abd: Bowel sounds present.  Soft, NT/ND without hepatosplenomegaly Ext: No lower extremity edema.  Radial, PT, and DP pulses are 2+ bilaterally. Significant right groin bruising remains. There is a questionable small hematoma just lateral to the right femoral artery. Right femoral artery pulses 2+. No bruit or thrill present. Skin: warm and dry without rash  EKG:  Normal sinus rhythm, anteroseptal Q-waves, and anterolateral T-wave inversions.  Anteroseptal ST elevation has resolved since EKG on 09/24/16 (I have personally reviewed both tracings).  Lab Results  Component Value Date   WBC 11.2 (H) 09/24/2016   HGB 11.8 (L) 09/24/2016   HCT 34.6 (L) 09/24/2016  MCV 89.6 09/24/2016   PLT 208 09/24/2016    Lab Results  Component Value Date   NA 135 09/24/2016   K 4.7 09/24/2016   CL 102 09/24/2016   CO2 28 09/24/2016   BUN 7 09/24/2016   CREATININE 0.85 09/24/2016   GLUCOSE 112 (H) 09/24/2016   ALT 17 09/23/2016    Lab Results  Component Value Date   CHOL 240 (H) 09/23/2016   HDL 53 09/23/2016   LDLCALC 156 (H) 09/23/2016   TRIG 153 (H) 09/23/2016   CHOLHDL 4.5 09/23/2016    --------------------------------------------------------------------------------------------------  ASSESSMENT AND  PLAN: Coronary artery disease status post recent anterior STEMI The patient has not had any further chest pain. She is tolerating dual antiplatelet and high intensity statin therapy well. I have completed paperwork for pharmacy assistance for ticagrelor. We will continue DAPT  for at least 12 months. If she develops side effects or is unable to afford ticagrelor, we could consider switching her to clopidogrel. We will continue with aggressive secondary prevention. We will readdress cardiac rehabilitation when the patient returns to see me, as I would like to reassess her LV function before then and also ensure that her right leg discomfort has resolved.  Ischemic cardiomyopathy with severe systolic left ventricular dysfunction The patient appears euvolemic with NYHA class II-III heart failure symptoms. She describes some orthopnea since leaving the hospital, though she is without edema or JVD today. Her lungs are also clear. We will not initiate diuretic therapy at this time. However, we will increase carvedilol to 3.125 mg twice a day and also start lisinopril 2.5 mg daily. I counseled the patient on continuing a low salt diet. Crystal Roberson is scheduled for limited echocardiogram on 10/10/16 to reassess LV function. We will also obtain a basic metabolic panel at that time to monitor her renal function and electrolytes.  Right groin hematoma Significant bruising is noted in the right groin. There is question of a small hematoma lateral to the femoral artery but no clinical evidence of a pseudoaneurysm. Pedal pulses are normal. I have reassured the patient that her discomfort and bruising should continue to improve. She was advised to contact us if she has experienced no improvement over the next week so that we can proceed with vascular ultrasound to exclude pseudoaneurysm.  Hypertension Blood pressure is normal today. We will therefore increase carvedilol and add back low-dose lisinopril for treatment of  her systolic heart failure.  Hyperlipidemia Goal LDL is less than 70. The patient is tolerating atorvastatin 80 mg daily well. We will continue with this and plan to recheck a lipid panel when the patient returns to see me.  Follow-up: Return to clinic in 2 months.  Yvonne Kendallhristopher Zenora Karpel, MD 10/02/2016 9:10 AM

## 2016-10-02 NOTE — Patient Instructions (Addendum)
Medication Instructions:   Increase coreg (carvedilol) to 3.125mg  two times a day.  Start lisinopril 2.5mg  daily  Labwork: Your physician recommends that you return for lab work on January 16,2018 when you have your echocardiogram done.   Testing/Procedures: Your physician has requested that you have an echocardiogram. Echocardiography is a painless test that uses sound waves to create images of your heart. It provides your doctor with information about the size and shape of your heart and how well your heart's chambers and valves are working. This procedure takes approximately one hour. There are no restrictions for this procedure.  This is scheduled for January 16,2018 at 1PM.   Follow-Up: Your physician recommends that you schedule a follow-up appointment in: 2 months with Dr End. This is scheduled for Thursday March 8,2018 at 8:40AM.         If you need a refill on your cardiac medications before your next appointment, please call your pharmacy.

## 2016-10-04 ENCOUNTER — Telehealth: Payer: Self-pay | Admitting: Internal Medicine

## 2016-10-04 NOTE — Telephone Encounter (Signed)
That sounds reasonable.  Based on where her pain is located yesterday, she may need vascular ultrasound to versus CT for further evaluation.  Thanks.

## 2016-10-04 NOTE — Telephone Encounter (Signed)
I spoke with pt. She reports cath site looks the same as when evaluated by Dr. Okey DupreEnd on 10/02/16.  Site was sore on Monday.  For the last 2 days she has been experiencing pain in middle lower abdomen.  She was not having this pain when she saw Dr. Okey DupreEnd in the office on 1/8. She states it is very painful and she cannot put underwear on. Will review with provider in office.

## 2016-10-04 NOTE — Telephone Encounter (Signed)
Follow Up:     Pt was seen on Monday by Dr End. She had a hard place where Cath was done.She had the Cath on 10-15-15,still having pain in the area where she had the Cath. She is wondering how long this should last and she still have a hard place in that area.

## 2016-10-04 NOTE — Telephone Encounter (Signed)
Reviewed with Dr. Excell Seltzerooper and pt should have office visit tomorrow. If she develops increasing pain or has  dizziness or change in symptoms prior to appt she should go to ED for evaluation.  I spoke with pt and gave her this information.  I scheduled her to see Cline CrockKathryn Thompson, PA tomorrow at 8:30

## 2016-10-05 ENCOUNTER — Ambulatory Visit (INDEPENDENT_AMBULATORY_CARE_PROVIDER_SITE_OTHER): Payer: Self-pay | Admitting: Physician Assistant

## 2016-10-05 ENCOUNTER — Encounter: Payer: Self-pay | Admitting: Physician Assistant

## 2016-10-05 VITALS — BP 108/62 | HR 82 | Ht 65.0 in | Wt 136.8 lb

## 2016-10-05 DIAGNOSIS — S301XXD Contusion of abdominal wall, subsequent encounter: Secondary | ICD-10-CM

## 2016-10-05 NOTE — Progress Notes (Addendum)
Cardiology Office Note    Date:  10/05/2016   ID:  Crystal Roberson, DOB January 02, 1966, MRN [id number]  PCP:  Crystal Sams, DO  Cardiologist:  Dr. Okey Dupre  CC: bruising at cath site  History of Present Illness:  Crystal Roberson is a 51 y.o. female with a history of CAD s/p STEMI (08/2016), ICM, HTN, migraine HAs, chronic LBP, tobacco abuse, and depression who presents to clinic for evaluation of cath site.   The patient developed acute onset of chest pain on the morning of 09/23/16, with EKG demonstrating anterior ST segment elevation. She was found to have a 99% stenosis of the mid LAD with TIMI-2 flow, which was successfully treated with a single drug-eluting stent. LVEF was severely decreased at 25-35% with anterior, apical, and apical inferior akinesis. The patient's hospital course was complicated by bleeding from her right femoral arteriotomy site as well as low blood pressures limiting titration of evidence-based heart failure therapy.  She was recently on 10/02/16 by Dr. Okey Dupre for post hospital follow up and was doing well. Plans were made for repeat 2D ECHO. He examined her groin site and felt there was no clinical evidence of a pseudoaneurysm. Pedal pulses were normal. He provided reassurance.   Today she presents to clinic for evaluation of cath site. The bruising has moved over her pubic area and causing some light pressure. Pain is not severe. Patient's daughter in law urged her to have it seen so she called in to make an appointment. No CP, SOB, weakness, dizziness or syncope. Otherwise feeling pretty good. She has been up riding in the car over the past couple days and thinks this may have aggravated it. She can't feel it at all when she lays flat.    Past Medical History:  Diagnosis Date  . Allergy   . Chicken pox   . Chronic back pain   . DDD (degenerative disc disease), lumbar   . Depression   . Heart attack   . Hypertension   . Migraines   . Syncope 09/12/2016    Past Surgical  History:  Procedure Laterality Date  . CARDIAC CATHETERIZATION N/A 09/23/2016   Procedure: Left Heart Cath and Coronary Angiography;  Surgeon: Yvonne Kendall, MD;  Location: [facility] INVASIVE CV LAB;  Service: Cardiovascular;  Laterality: N/A;  . CARDIAC CATHETERIZATION N/A 09/23/2016   Procedure: Coronary Stent Intervention;  Surgeon: Yvonne Kendall, MD;  Location: [facility] INVASIVE CV LAB;  Service: Cardiovascular;  Laterality: N/A;  . CERVICAL DISC SURGERY    . TUBAL LIGATION      Current Medications: Outpatient Medications Prior to Visit  Medication Sig Dispense Refill  . aspirin 81 MG chewable tablet Chew 1 tablet (81 mg total) by mouth daily. 30 tablet 11  . atorvastatin (LIPITOR) 80 MG tablet Take 1 tablet (80 mg total) by mouth daily at 6 PM. 30 tablet 6  . buPROPion (WELLBUTRIN XL) 150 MG 24 hr tablet Take 1 tablet (150 mg total) by mouth daily. 30 tablet 0  . carvedilol (COREG) 3.125 MG tablet Take 1 tablet (3.125 mg total) by mouth 2 (two) times daily. 180 tablet 1  . FLUoxetine (PROZAC) 20 MG capsule Take 2 capsules (40 mg total) by mouth daily. 180 capsule 1  . HYDROcodone-acetaminophen (NORCO/VICODIN) 5-325 MG tablet Limit 1-3 tablets po daily if tolerated for breakthrough pain while taking tramadol 90 tablet 0  . lidocaine (LIDODERM) 5 % Place 1 patch onto the skin daily. Apply 1-3 patches to skin  for 12 hours then Remove patches for 12 hours. Repeat process if tolerated 90 patch 2  . lisinopril (PRINIVIL,ZESTRIL) 2.5 MG tablet Take 1 tablet (2.5 mg total) by mouth daily. 90 tablet 1  . nitroGLYCERIN (NITROSTAT) 0.4 MG SL tablet Place 1 tablet (0.4 mg total) under the tongue every 5 (five) minutes as needed for chest pain (CP or SOB). 25 tablet 12  . ticagrelor (BRILINTA) 90 MG TABS tablet Take 1 tablet (90 mg total) by mouth 2 (two) times daily. 60 tablet 11  . tiZANidine (ZANAFLEX) 2 MG tablet Limit 1 tablet by mouth per day or 2 tablets by mouth per day if tolerated 50 tablet 0  .  traMADol (ULTRAM) 50 MG tablet Limit 2-4 tablets po daily if tolerated 120 tablet 0  . zolpidem (AMBIEN) 10 MG tablet Take 1 tablet (10 mg total) by mouth at bedtime as needed for sleep. 30 tablet 0   No facility-administered medications prior to visit.      Allergies:   Pregabalin and Codeine   Social History   Social History  . Marital status: Divorced    Spouse name: N/A  . Number of children: N/A  . Years of education: N/A   Social History Main Topics  . Smoking status: Former Smoker    Packs/day: 0.75    Years: 30.00    Types: Cigarettes    Quit date: 09/23/2016  . Smokeless tobacco: Never Used  . Alcohol use No  . Drug use: No  . Sexual activity: Not Currently   Other Topics Concern  . None   Social History Narrative  . None     Family History:  The patient's family history includes Arthritis in her mother; Heart disease in her maternal grandmother; Hyperlipidemia in her mother; Hypertension in her maternal grandmother and mother; Mental illness in her mother.      ROS:   Please see the history of present illness.    ROS All other systems reviewed and are negative.   PHYSICAL EXAM:   VS:  BP 108/62   Pulse 82   Ht 5\' 5"  (1.651 m)   Wt 136 lb 12.8 oz (62.1 kg)   LMP  (Within Years)   SpO2 98%   BMI 22.76 kg/m    GEN: Well nourished, well developed, in no acute distress  HEENT: normal  Neck: no JVD, carotid bruits, or masses Cardiac: RRR; no murmurs, rubs, or gallops,no edema  Respiratory:  clear to auscultation bilaterally, normal work of breathing GI: soft, nontender, nondistended, + BS MS: no deformity or atrophy  Skin: warm and dry, significant hematoma in right groin extending down her right thigh and over her pubic area. Soft to touch and no bruits. Normal pedal pulses. Neuro:  Alert and Oriented x 3, Strength and sensation are intact Psych: euthymic mood, full affect    Wt Readings from Last 3 Encounters:  10/05/16 136 lb 12.8 oz (62.1 kg)    10/02/16 137 lb 12.8 oz (62.5 kg)  09/23/16 135 lb 9.3 oz (61.5 kg)      Studies/Labs Reviewed:   EKG:  EKG is NOT ordered today.    Recent Labs: 09/13/2016: TSH 1.804 09/14/2016: Magnesium 1.9 09/23/2016: ALT 17 09/24/2016: BUN 7; Creatinine, Ser 0.85; Hemoglobin 11.8; Platelets 208; Potassium 4.7; Sodium 135   Lipid Panel    Component Value Date/Time   CHOL 240 (H) 09/23/2016 0609   TRIG 153 (H) 09/23/2016 0609   HDL 53 09/23/2016 0609   CHOLHDL 4.5  09/23/2016 0609   VLDL 31 09/23/2016 0609   LDLCALC 156 (H) 09/23/2016 0609    Additional studies/ records that were reviewed today include:  Cath/PCI:  LHC/PCI (09/23/16): Anterior STEMI with 99% mid LAD stenosis with TIMI 2 flow. Mild to moderate, nonobstructive CAD involving the ostial and distal LAD, ostial and mid LCx, and mid/distal RCA. Severely reduced LV contraction (EF 25-35%) with anterior, apical, and apical inferior akinesis. Moderately elevated left ventricular filling pressure (LVEDP 27 mmHg).   Non-Invasive Evaluation(s):  Transthoracic echocardiogram (09/13/16): Normal LV size and contraction (EF 60-65%). Normal diastolic function. Trivial mitral and tricuspid regurgitation. Normal RV size and function.    ASSESSMENT & PLAN:   Femoral groin site hematoma: patient's daughter in law urged her to come in and be rechecked. Hematoma has now spread over pubic area. She thinks sitting up all day yesterday aggravated it. Some discomfort with certain positions, but sometimes can't feel it at all. Has narcotics and tramadol for chronic LBP.   PE reveals a significant hematoma in right groin extending down her right thigh and over her pubic area. Soft to touch and no bruits. Normal pedal pulses. I offered to do a groin US to rule out pseudoaneurysm although I do not think she has this. She would like to hold off on this for now, which I think is reasonable. She will let us know if anything gets worse.      Medication Adjustments/Labs and Tests Ordered: Current medicines are reviewed at length with the patient today.  Concerns regarding medicines are outlined above.  Medication changes, Labs and Tests ordered today are listed in the Patient Instructions below. Patient Instructions  Medication Instructions:  Your physician recommends that you continue on your current medications as directed. Please refer to the Current Medication list given to you today.   Labwork: None ordered  Testing/Procedures: None ordered  Follow-Up: Your physician recommends that you schedule a follow-up appointment in: KEEP YOUR APPOINTMENTS AS PLANNED   Any Other Special Instructions Will Be Listed Below (If Applicable).     If you need a refill on your cardiac medications before your next appointment, please call your pharmacy.      Signed, Cline CrockKathryn Minami Arriaga, PA-C  10/05/2016 9:02 AM    [facility] [address], [place], [place]  [address] Phone: [phone]; Fax: [phone]

## 2016-10-05 NOTE — Patient Instructions (Signed)
Medication Instructions:  Your physician recommends that you continue on your current medications as directed. Please refer to the Current Medication list given to you today.   Labwork: None ordered  Testing/Procedures: None ordered  Follow-Up: Your physician recommends that you schedule a follow-up appointment in: KEEP YOUR APPOINTMENTS AS PLANNED  Any Other Special Instructions Will Be Listed Below (If Applicable).   If you need a refill on your cardiac medications before your next appointment, please call your pharmacy.   

## 2016-10-10 ENCOUNTER — Other Ambulatory Visit (HOSPITAL_COMMUNITY): Payer: Self-pay

## 2016-10-10 ENCOUNTER — Other Ambulatory Visit: Payer: Self-pay

## 2016-10-17 ENCOUNTER — Ambulatory Visit (INDEPENDENT_AMBULATORY_CARE_PROVIDER_SITE_OTHER): Payer: Self-pay | Admitting: Family Medicine

## 2016-10-17 ENCOUNTER — Encounter: Payer: Self-pay | Admitting: Family Medicine

## 2016-10-17 DIAGNOSIS — J209 Acute bronchitis, unspecified: Secondary | ICD-10-CM

## 2016-10-17 MED ORDER — HYDROCOD POLST-CPM POLST ER 10-8 MG/5ML PO SUER: 5.0000 mL | Freq: Two times a day (BID) | ORAL | 0 refills | Status: DC | PRN

## 2016-10-17 MED ORDER — DOXYCYCLINE HYCLATE 100 MG PO TABS: 100.0000 mg | ORAL_TABLET | Freq: Two times a day (BID) | ORAL | 0 refills | Status: DC

## 2016-10-17 NOTE — Assessment & Plan Note (Signed)
New acute problem. Given comorbidities and a length of illness, treating empirically with doxycycline. Does not appear to be experiencing CHF exacerbation. Appears euvolemic.

## 2016-10-17 NOTE — Patient Instructions (Signed)
Take the medication as prescribed.  Follow up in 3 months.  Take care  Dr. Elianah Karis 

## 2016-10-17 NOTE — Progress Notes (Signed)
Subjective:  Patient ID: Crystal Roberson, female    DOB: 1966-06-06  Age: 51 y.o. MRN: [id number]  CC: Cough, SOB, runny nose  HPI:  51 year old female with a complex past medical history including recent STEMI presents with the above complaints.  Patient states she's been sick for the past 2 weeks. She's had runny nose, cough, headache, and shortness of breath. She endorses some symptoms of orthopnea. She has endorses to her cardiologist as well. She has recently seen him and was euvolemic. No associated fevers or chills. No known exacerbating or relieving factors. She has had known sick contacts. Cough is quite troublesome. Nonproductive. No other complaints or concerns at this time.  Social Hx   Social History   Social History  . Marital status: Divorced    Spouse name: N/A  . Number of children: N/A  . Years of education: N/A   Social History Main Topics  . Smoking status: Former Smoker    Packs/day: 0.75    Years: 30.00    Types: Cigarettes    Quit date: 09/23/2016  . Smokeless tobacco: Never Used  . Alcohol use No  . Drug use: No  . Sexual activity: Not Currently   Other Topics Concern  . None   Social History Narrative  . None   Review of Systems  Constitutional: Negative for fever.  HENT: Positive for rhinorrhea.   Respiratory: Positive for cough and shortness of breath.    Objective:  BP 124/85   Pulse 84   Temp 98.3 F (36.8 C) (Oral)   Wt 137 lb 12.8 oz (62.5 kg)   SpO2 98%   BMI 22.93 kg/m   BP/Weight 10/17/2016 10/05/2016 10/02/2016  Systolic BP 124 108 118  Diastolic BP 85 62 80  Wt. (Lbs) 137.8 136.8 137.8  BMI 22.93 22.76 22.93   Physical Exam  Constitutional: She is oriented to person, place, and time. She appears well-developed. No distress.  HENT:  Head: Normocephalic and atraumatic.  Mouth/Throat: Oropharynx is clear and moist.  Cardiovascular: Normal rate and regular rhythm.   No LE edema.  Pulmonary/Chest: Effort normal and breath  sounds normal.  Neurological: She is alert and oriented to person, place, and time.  Vitals reviewed.  Lab Results  Component Value Date   WBC 11.2 (H) 09/24/2016   HGB 11.8 (L) 09/24/2016   HCT 34.6 (L) 09/24/2016   PLT 208 09/24/2016   GLUCOSE 112 (H) 09/24/2016   CHOL 240 (H) 09/23/2016   TRIG 153 (H) 09/23/2016   HDL 53 09/23/2016   LDLCALC 156 (H) 09/23/2016   ALT 17 09/23/2016   AST 26 09/23/2016   NA 135 09/24/2016   K 4.7 09/24/2016   CL 102 09/24/2016   CREATININE 0.85 09/24/2016   BUN 7 09/24/2016   CO2 28 09/24/2016   TSH 1.804 09/13/2016   INR 0.97 09/23/2016   HGBA1C 5.1 09/23/2016   Assessment & Plan:   Problem List Items Addressed This Visit    Acute bronchitis    New acute problem. Given comorbidities and a length of illness, treating empirically with doxycycline. Does not appear to be experiencing CHF exacerbation. Appears euvolemic.         Meds ordered this encounter  Medications  . doxycycline (VIBRA-TABS) 100 MG tablet    Sig: Take 1 tablet (100 mg total) by mouth 2 (two) times daily.    Dispense:  14 tablet    Refill:  0  . chlorpheniramine-HYDROcodone (TUSSIONEX PENNKINETIC ER) 10-8 MG/5ML  SUER    Sig: Take 5 mLs by mouth every 12 (twelve) hours as needed.    Dispense:  115 mL    Refill:  0    Follow-up: 3 months  Icela Glymph Adriana Simas DO [facility]

## 2016-10-17 NOTE — Progress Notes (Signed)
Pre visit review using our clinic review tool, if applicable. No additional management support is needed unless otherwise documented below in the visit note. 

## 2016-10-25 ENCOUNTER — Other Ambulatory Visit (HOSPITAL_COMMUNITY): Payer: Self-pay

## 2016-10-25 ENCOUNTER — Other Ambulatory Visit: Payer: Self-pay

## 2016-10-26 ENCOUNTER — Other Ambulatory Visit: Payer: Self-pay | Admitting: Family Medicine

## 2016-10-27 NOTE — Telephone Encounter (Signed)
Refilled 09/14/16 by hospital. Pt has not seen you for this. Pt was stabel on prozac.  Please advise?

## 2016-11-06 ENCOUNTER — Other Ambulatory Visit: Payer: Self-pay | Admitting: *Deleted

## 2016-11-06 ENCOUNTER — Other Ambulatory Visit: Payer: Self-pay | Admitting: Physician Assistant

## 2016-11-06 ENCOUNTER — Ambulatory Visit (HOSPITAL_COMMUNITY): Payer: Self-pay | Attending: Internal Medicine

## 2016-11-06 DIAGNOSIS — I255 Ischemic cardiomyopathy: Secondary | ICD-10-CM

## 2016-11-06 DIAGNOSIS — I313 Pericardial effusion (noninflammatory): Secondary | ICD-10-CM | POA: Insufficient documentation

## 2016-11-06 DIAGNOSIS — I5021 Acute systolic (congestive) heart failure: Secondary | ICD-10-CM

## 2016-11-06 DIAGNOSIS — I510 Cardiac septal defect, acquired: Secondary | ICD-10-CM | POA: Insufficient documentation

## 2016-11-06 DIAGNOSIS — I2102 ST elevation (STEMI) myocardial infarction involving left anterior descending coronary artery: Secondary | ICD-10-CM

## 2016-11-06 DIAGNOSIS — I340 Nonrheumatic mitral (valve) insufficiency: Secondary | ICD-10-CM | POA: Insufficient documentation

## 2016-11-06 MED ORDER — PERFLUTREN LIPID MICROSPHERE
1.0000 mL | INTRAVENOUS | Status: AC | PRN
  Administered 2016-11-06: 2 mL via INTRAVENOUS

## 2016-11-07 LAB — BASIC METABOLIC PANEL
BUN / CREAT RATIO: 13 (ref 9–23)
BUN: 12 mg/dL (ref 6–24)
CALCIUM: 9 mg/dL (ref 8.7–10.2)
CHLORIDE: 106 mmol/L (ref 96–106)
CO2: 24 mmol/L (ref 18–29)
Creatinine, Ser: 0.92 mg/dL (ref 0.57–1.00)
GFR calc Af Amer: 82 mL/min/{1.73_m2} (ref >59)
GFR calc non Af Amer: 71 mL/min/{1.73_m2} (ref >59)
GLUCOSE: 93 mg/dL (ref 65–99)
Potassium: 3.8 mmol/L (ref 3.5–5.2)
Sodium: 145 mmol/L — ABNORMAL HIGH (ref 134–144)

## 2016-11-08 ENCOUNTER — Telehealth: Payer: Self-pay | Admitting: Internal Medicine

## 2016-11-08 DIAGNOSIS — I2102 ST elevation (STEMI) myocardial infarction involving left anterior descending coronary artery: Secondary | ICD-10-CM

## 2016-11-08 DIAGNOSIS — I5021 Acute systolic (congestive) heart failure: Secondary | ICD-10-CM

## 2016-11-08 NOTE — Telephone Encounter (Signed)
I attempted to reach the patient by phone to discuss the results of her echo, demonstrated continued severe LV dysfunction. BMP shows stable renal function and potassium.  Will try to reach her again later today or tomorrow to review results and discuss increasing carvedilol and lisinopril.

## 2016-11-09 ENCOUNTER — Telehealth: Payer: Self-pay | Admitting: Internal Medicine

## 2016-11-09 MED ORDER — LISINOPRIL 2.5 MG PO TABS: 5.0000 mg | ORAL_TABLET | Freq: Every day | ORAL | 1 refills | Status: DC

## 2016-11-09 NOTE — Telephone Encounter (Signed)
I will forward to Dr End for review.  

## 2016-11-09 NOTE — Telephone Encounter (Signed)
This should be ok. However, if she develops lightheadedness or worsening fatigue, she should contact us an her pain doctor.  Yvonne Kendallhristopher Seraj Dunnam, MD [facility] Pager: [phone]

## 2016-11-09 NOTE — Telephone Encounter (Signed)
New Message    Pt states her pain management doctor switched her from hydrocodone to morphine sulfate er 15 mg 2x day, is this okay for her to take with the medication she is on ?   Pt has not started taking this medication until you call her

## 2016-11-09 NOTE — Telephone Encounter (Signed)
Discussed with patient

## 2016-11-09 NOTE — Telephone Encounter (Signed)
I spoke with the patient regarding the results of echo, which shows continued severe LV dysfunction. She notes stable orthopnea and exertional dyspnea, but is gradually feeling better. Groin pain has resolved.  We have agreed to increase lisinopril to 5 mg daily and follow-up as planned next month.  Yvonne Kendallhristopher Ceanna Wareing, MD [facility] Pager: [phone]

## 2016-11-23 ENCOUNTER — Encounter: Payer: Self-pay | Admitting: Internal Medicine

## 2016-11-23 ENCOUNTER — Telehealth: Payer: Self-pay | Admitting: Internal Medicine

## 2016-11-23 MED ORDER — CARVEDILOL 6.25 MG PO TABS: 6.2500 mg | ORAL_TABLET | Freq: Two times a day (BID) | ORAL | 3 refills | Status: DC

## 2016-11-23 NOTE — Telephone Encounter (Signed)
Follow Up:   Dr End had left a message for the pt to call back and talk to a nurse. This is  in regards of how pt is tolerating her Lisinopril.

## 2016-11-23 NOTE — Telephone Encounter (Signed)
Thank you :)

## 2016-11-23 NOTE — Telephone Encounter (Signed)
Left message for [facility] to [facility] in order to discuss how she has been tolerating increased dose of lisinopril (increased to 5 mg daily two weeks ago). If she feels well without lightheadedness, she should double carvedilol to 6.25 mg BID. We will follow-up in the office as planned on 11/30/16.  Yvonne Kendallhristopher End, MD [facility]   Pt has been feeling well on increased dose of Lisinopril.  Denies dizziness or lightheadedness.  States BP is usually normal, around 120/60 or slightly higher.  Advised pt of recommendations to increase Carvedilol to 6.25mg  BID.  Pt in agreement.  Also reminded pt to keep appt on 11/30/16.  Pt appreciative for call. Will route to Dr. Okey DupreEnd to make him aware.

## 2016-11-23 NOTE — Telephone Encounter (Signed)
Left message for patient to call back [facility] in order to discuss how she has been tolerating increased dose of lisinopril (increased to 5 mg daily two weeks ago). If she feels well without lightheadedness, she should double carvedilol to 6.25 mg BID. We will follow-up in the office as planned on 11/30/16.  Yvonne Kendallhristopher Reshma Hoey, MD [facility] Pager: [phone]

## 2016-11-25 ENCOUNTER — Other Ambulatory Visit: Payer: Self-pay | Admitting: Physician Assistant

## 2016-11-25 MED ORDER — POTASSIUM CHLORIDE ER 10 MEQ PO TBCR: 20.0000 meq | EXTENDED_RELEASE_TABLET | Freq: Every day | ORAL | 1 refills | Status: DC

## 2016-11-25 MED ORDER — FUROSEMIDE 40 MG PO TABS: 40.0000 mg | ORAL_TABLET | Freq: Every day | ORAL | 1 refills | Status: DC

## 2016-11-25 NOTE — Progress Notes (Signed)
    Patient called on call provider to report worsening SOB and orthopnea. She has EF of 30-35% and not on any diuretic. We discussed her coming into the ER vs trying to manage as an outpatient and she would like to try outpatient. I have called in lasix 40mg  daily and Kdur 20mEq daily to her pharmacy. She has been told to take 80mg  Lasix today followed by 40mg  daily and 40mEq Kdur followed by 20MEq daily. We discussed ER precautions. She will call me tomorrow to report how she is feeling or come into the ER if she significantly worsens. SHe has an appointment with Dr. Okey DupreEnd this Thursday. If she feels better, I will bring her in for a BMET on Monday.   Cline CrockKathryn Katrina Brosh PA-C  [facility]

## 2016-11-26 ENCOUNTER — Encounter (HOSPITAL_COMMUNITY): Payer: Self-pay | Admitting: Emergency Medicine

## 2016-11-26 ENCOUNTER — Emergency Department (HOSPITAL_COMMUNITY): Payer: Self-pay

## 2016-11-26 ENCOUNTER — Other Ambulatory Visit: Payer: Self-pay | Admitting: Physician Assistant

## 2016-11-26 ENCOUNTER — Inpatient Hospital Stay (HOSPITAL_COMMUNITY)
Admission: EM | Admit: 2016-11-26 | Discharge: 2016-11-29 | DRG: 312 | Disposition: A | Discharge status: Home / Self Care | Payer: Self-pay | Attending: Internal Medicine | Admitting: Internal Medicine

## 2016-11-26 DIAGNOSIS — T501X5A Adverse effect of loop [high-ceiling] diuretics, initial encounter: Secondary | ICD-10-CM | POA: Diagnosis present

## 2016-11-26 DIAGNOSIS — Z79899 Other long term (current) drug therapy: Secondary | ICD-10-CM

## 2016-11-26 DIAGNOSIS — I5042 Chronic combined systolic (congestive) and diastolic (congestive) heart failure: Secondary | ICD-10-CM | POA: Diagnosis present

## 2016-11-26 DIAGNOSIS — I2102 ST elevation (STEMI) myocardial infarction involving left anterior descending coronary artery: Secondary | ICD-10-CM | POA: Diagnosis present

## 2016-11-26 DIAGNOSIS — I5022 Chronic systolic (congestive) heart failure: Secondary | ICD-10-CM

## 2016-11-26 DIAGNOSIS — I340 Nonrheumatic mitral (valve) insufficiency: Secondary | ICD-10-CM | POA: Diagnosis present

## 2016-11-26 DIAGNOSIS — Z8261 Family history of arthritis: Secondary | ICD-10-CM

## 2016-11-26 DIAGNOSIS — G43909 Migraine, unspecified, not intractable, without status migrainosus: Secondary | ICD-10-CM | POA: Diagnosis present

## 2016-11-26 DIAGNOSIS — F329 Major depressive disorder, single episode, unspecified: Secondary | ICD-10-CM | POA: Diagnosis present

## 2016-11-26 DIAGNOSIS — E876 Hypokalemia: Secondary | ICD-10-CM | POA: Diagnosis present

## 2016-11-26 DIAGNOSIS — G8929 Other chronic pain: Secondary | ICD-10-CM | POA: Diagnosis present

## 2016-11-26 DIAGNOSIS — Z955 Presence of coronary angioplasty implant and graft: Secondary | ICD-10-CM

## 2016-11-26 DIAGNOSIS — I255 Ischemic cardiomyopathy: Secondary | ICD-10-CM | POA: Diagnosis present

## 2016-11-26 DIAGNOSIS — E860 Dehydration: Secondary | ICD-10-CM | POA: Diagnosis present

## 2016-11-26 DIAGNOSIS — Z7901 Long term (current) use of anticoagulants: Secondary | ICD-10-CM

## 2016-11-26 DIAGNOSIS — I252 Old myocardial infarction: Secondary | ICD-10-CM

## 2016-11-26 DIAGNOSIS — I959 Hypotension, unspecified: Secondary | ICD-10-CM

## 2016-11-26 DIAGNOSIS — M549 Dorsalgia, unspecified: Secondary | ICD-10-CM | POA: Diagnosis present

## 2016-11-26 DIAGNOSIS — Z79891 Long term (current) use of opiate analgesic: Secondary | ICD-10-CM

## 2016-11-26 DIAGNOSIS — Z87891 Personal history of nicotine dependence: Secondary | ICD-10-CM

## 2016-11-26 DIAGNOSIS — F419 Anxiety disorder, unspecified: Secondary | ICD-10-CM | POA: Diagnosis present

## 2016-11-26 DIAGNOSIS — I10 Essential (primary) hypertension: Secondary | ICD-10-CM | POA: Diagnosis present

## 2016-11-26 DIAGNOSIS — Z7982 Long term (current) use of aspirin: Secondary | ICD-10-CM

## 2016-11-26 DIAGNOSIS — Z818 Family history of other mental and behavioral disorders: Secondary | ICD-10-CM

## 2016-11-26 DIAGNOSIS — I2510 Atherosclerotic heart disease of native coronary artery without angina pectoris: Secondary | ICD-10-CM | POA: Diagnosis present

## 2016-11-26 DIAGNOSIS — I951 Orthostatic hypotension: Principal | ICD-10-CM | POA: Diagnosis present

## 2016-11-26 DIAGNOSIS — M5137 Other intervertebral disc degeneration, lumbosacral region: Secondary | ICD-10-CM | POA: Diagnosis present

## 2016-11-26 DIAGNOSIS — I110 Hypertensive heart disease with heart failure: Secondary | ICD-10-CM | POA: Diagnosis present

## 2016-11-26 DIAGNOSIS — R0600 Dyspnea, unspecified: Secondary | ICD-10-CM

## 2016-11-26 DIAGNOSIS — F32A Depression, unspecified: Secondary | ICD-10-CM | POA: Diagnosis present

## 2016-11-26 DIAGNOSIS — Z8249 Family history of ischemic heart disease and other diseases of the circulatory system: Secondary | ICD-10-CM

## 2016-11-26 DIAGNOSIS — R079 Chest pain, unspecified: Secondary | ICD-10-CM

## 2016-11-26 LAB — CBC WITH DIFFERENTIAL/PLATELET
Basophils Absolute: 0.1 10*3/uL (ref 0.0–0.1)
Basophils Relative: 1 %
EOS ABS: 0.1 10*3/uL (ref 0.0–0.7)
EOS PCT: 1 %
HCT: 37 % (ref 36.0–46.0)
HEMOGLOBIN: 12.4 g/dL (ref 12.0–15.0)
Lymphocytes Relative: 30 %
Lymphs Abs: 2.3 10*3/uL (ref 0.7–4.0)
MCH: 30.4 pg (ref 26.0–34.0)
MCHC: 33.5 g/dL (ref 30.0–36.0)
MCV: 90.7 fL (ref 78.0–100.0)
MONOS PCT: 9 %
Monocytes Absolute: 0.7 10*3/uL (ref 0.1–1.0)
NEUTROS PCT: 59 %
Neutro Abs: 4.6 10*3/uL (ref 1.7–7.7)
PLATELETS: 194 10*3/uL (ref 150–400)
RBC: 4.08 MIL/uL (ref 3.87–5.11)
RDW: 12.5 % (ref 11.5–15.5)
WBC: 7.7 10*3/uL (ref 4.0–10.5)

## 2016-11-26 LAB — COMPREHENSIVE METABOLIC PANEL
ALT: 36 U/L (ref 14–54)
ANION GAP: 11 (ref 5–15)
AST: 21 U/L (ref 15–41)
Albumin: 3.5 g/dL (ref 3.5–5.0)
Alkaline Phosphatase: 242 U/L — ABNORMAL HIGH (ref 38–126)
BILIRUBIN TOTAL: 0.8 mg/dL (ref 0.3–1.2)
BUN: 7 mg/dL (ref 6–20)
CALCIUM: 9.1 mg/dL (ref 8.9–10.3)
CO2: 25 mmol/L (ref 22–32)
Chloride: 101 mmol/L (ref 101–111)
Creatinine, Ser: 1.11 mg/dL — ABNORMAL HIGH (ref 0.44–1.00)
GFR calc Af Amer: >60 mL/min (ref >60)
GFR, EST NON AFRICAN AMERICAN: 56 mL/min — AB (ref >60)
Glucose, Bld: 99 mg/dL (ref 65–99)
POTASSIUM: 2.9 mmol/L — AB (ref 3.5–5.1)
Sodium: 137 mmol/L (ref 135–145)
TOTAL PROTEIN: 6.5 g/dL (ref 6.5–8.1)

## 2016-11-26 LAB — CBC
HCT: 37.8 % (ref 36.0–46.0)
Hemoglobin: 12.4 g/dL (ref 12.0–15.0)
MCH: 30.2 pg (ref 26.0–34.0)
MCHC: 32.8 g/dL (ref 30.0–36.0)
MCV: 92.2 fL (ref 78.0–100.0)
PLATELETS: 199 10*3/uL (ref 150–400)
RBC: 4.1 MIL/uL (ref 3.87–5.11)
RDW: 12.5 % (ref 11.5–15.5)
WBC: 8.6 10*3/uL (ref 4.0–10.5)

## 2016-11-26 LAB — TROPONIN I: TROPONIN I: 0.03 ng/mL — AB (ref <0.03)

## 2016-11-26 LAB — LIPASE, BLOOD: Lipase: 15 U/L (ref 11–51)

## 2016-11-26 LAB — CREATININE, SERUM
CREATININE: 1.01 mg/dL — AB (ref 0.44–1.00)
GFR calc Af Amer: >60 mL/min (ref >60)
GFR calc non Af Amer: >60 mL/min (ref >60)

## 2016-11-26 LAB — BRAIN NATRIURETIC PEPTIDE: B Natriuretic Peptide: 160.4 pg/mL — ABNORMAL HIGH (ref 0.0–100.0)

## 2016-11-26 LAB — I-STAT TROPONIN, ED: TROPONIN I, POC: 0.02 ng/mL (ref 0.00–0.08)

## 2016-11-26 MED ORDER — SODIUM CHLORIDE 0.9% FLUSH
3.0000 mL | Freq: Two times a day (BID) | INTRAVENOUS | Status: DC
  Administered 2016-11-27 – 2016-11-28 (×4): 3 mL via INTRAVENOUS

## 2016-11-26 MED ORDER — ASPIRIN 81 MG PO CHEW
243.0000 mg | CHEWABLE_TABLET | Freq: Once | ORAL | Status: AC
  Administered 2016-11-26: 243 mg via ORAL
  Filled 2016-11-26: qty 3

## 2016-11-26 MED ORDER — FLUOXETINE HCL 20 MG PO CAPS
40.0000 mg | ORAL_CAPSULE | Freq: Every day | ORAL | Status: DC
  Administered 2016-11-27 – 2016-11-29 (×3): 40 mg via ORAL
  Filled 2016-11-26 (×3): qty 2

## 2016-11-26 MED ORDER — MORPHINE SULFATE ER 15 MG PO TBCR
15.0000 mg | EXTENDED_RELEASE_TABLET | Freq: Two times a day (BID) | ORAL | Status: DC
  Administered 2016-11-26 – 2016-11-29 (×6): 15 mg via ORAL
  Filled 2016-11-26 (×6): qty 1

## 2016-11-26 MED ORDER — BUPROPION HCL ER (XL) 150 MG PO TB24
150.0000 mg | ORAL_TABLET | Freq: Every day | ORAL | Status: DC
  Administered 2016-11-27 – 2016-11-29 (×3): 150 mg via ORAL
  Filled 2016-11-26 (×3): qty 1

## 2016-11-26 MED ORDER — ZOLPIDEM TARTRATE 5 MG PO TABS: 5.0000 mg | ORAL_TABLET | Freq: Every evening | ORAL | Status: DC | PRN

## 2016-11-26 MED ORDER — SODIUM CHLORIDE 0.9 % IV SOLN
INTRAVENOUS | Status: DC
  Administered 2016-11-26 – 2016-11-27 (×2): via INTRAVENOUS

## 2016-11-26 MED ORDER — IOPAMIDOL (ISOVUE-370) INJECTION 76%
INTRAVENOUS | Status: AC
  Administered 2016-11-26: 100 mL
  Filled 2016-11-26: qty 100

## 2016-11-26 MED ORDER — POTASSIUM CHLORIDE CRYS ER 10 MEQ PO TBCR
20.0000 meq | EXTENDED_RELEASE_TABLET | Freq: Every day | ORAL | Status: DC
  Administered 2016-11-27 – 2016-11-29 (×3): 20 meq via ORAL
  Filled 2016-11-26 (×4): qty 2

## 2016-11-26 MED ORDER — HEPARIN SODIUM (PORCINE) 5000 UNIT/ML IJ SOLN
5000.0000 [IU] | Freq: Three times a day (TID) | INTRAMUSCULAR | Status: DC
  Administered 2016-11-26 – 2016-11-29 (×8): 5000 [IU] via SUBCUTANEOUS
  Filled 2016-11-26 (×9): qty 1

## 2016-11-26 MED ORDER — SODIUM CHLORIDE 0.9 % IV BOLUS (SEPSIS)
500.0000 mL | Freq: Once | INTRAVENOUS | Status: AC
  Administered 2016-11-26: 500 mL via INTRAVENOUS

## 2016-11-26 MED ORDER — ATORVASTATIN CALCIUM 80 MG PO TABS
80.0000 mg | ORAL_TABLET | Freq: Every day | ORAL | Status: DC
  Administered 2016-11-27 – 2016-11-28 (×2): 80 mg via ORAL
  Filled 2016-11-26 (×2): qty 1

## 2016-11-26 MED ORDER — NITROGLYCERIN 0.4 MG SL SUBL: 0.4000 mg | SUBLINGUAL_TABLET | SUBLINGUAL | Status: DC | PRN

## 2016-11-26 MED ORDER — ASPIRIN 81 MG PO CHEW
81.0000 mg | CHEWABLE_TABLET | Freq: Every day | ORAL | Status: DC
  Administered 2016-11-27 – 2016-11-29 (×3): 81 mg via ORAL
  Filled 2016-11-26 (×3): qty 1

## 2016-11-26 MED ORDER — SODIUM CHLORIDE 0.9 % IV SOLN
30.0000 meq | Freq: Once | INTRAVENOUS | Status: AC
  Administered 2016-11-26: 30 meq via INTRAVENOUS
  Filled 2016-11-26: qty 15

## 2016-11-26 MED ORDER — POTASSIUM CHLORIDE CRYS ER 20 MEQ PO TBCR
30.0000 meq | EXTENDED_RELEASE_TABLET | Freq: Once | ORAL | Status: AC
  Administered 2016-11-27: 30 meq via ORAL
  Filled 2016-11-26: qty 1

## 2016-11-26 MED ORDER — TICAGRELOR 90 MG PO TABS
90.0000 mg | ORAL_TABLET | Freq: Two times a day (BID) | ORAL | Status: AC
  Administered 2016-11-26 – 2016-11-27 (×3): 90 mg via ORAL
  Filled 2016-11-26 (×3): qty 1

## 2016-11-26 MED ORDER — TIZANIDINE HCL 2 MG PO TABS
2.0000 mg | ORAL_TABLET | Freq: Every day | ORAL | Status: DC
  Administered 2016-11-26 – 2016-11-28 (×3): 2 mg via ORAL
  Filled 2016-11-26 (×3): qty 1

## 2016-11-26 NOTE — ED Notes (Signed)
Pt to xray

## 2016-11-26 NOTE — Progress Notes (Signed)
Patient arrived to 2w01 via wheelchair. Tele box placed, CCMD notified x2. VSS. Pt c/o chronic low back pain, scheduled pain medication given. Pt also having mild dyspnea, provided with 2L O2 via [place] for comfort. Updated pt on plan of care, oriented to room and call bell. Will continue to monitor. Crystal LinerStephanie M Leyana Whidden, RN

## 2016-11-26 NOTE — ED Notes (Signed)
C/o tingling to bilateral hands.  No neuro deficits noted.

## 2016-11-26 NOTE — ED Notes (Signed)
Pt to CT

## 2016-11-26 NOTE — ED Provider Notes (Signed)
Emergency Department Provider Note   I have reviewed the triage vital signs and the nursing notes.   HISTORY  Chief Complaint Shortness of Breath; Hypotension; and Chest Pain   HPI Crystal Roberson is a 51 y.o. female with PMH of AMI, HTN, and CHF (EF 30-35%) presents to the ED for evaluation of chest tightness, dyspnea, and hypotension with near-syncope. She reports dyspnea worsening over the last 36 hours that is worst when laying flat. She subsequently developed chest tightness that is constant, moderate, with no modifying factors. Patient states that she called her primary care physician who recommended that she take a double dose of her Lasix for the next 24 hours which she did. Today she was going to take a nap because she hasn't been sleeping well and had a near syncope event when moving some items from her recliner. She describes her vision going black and warm sensation over her entire body. She was near her recliner was able to sit herself down without falling. She took her blood pressure at home with SBP in the 60s. No fever, chills, productive cough, vomiting, or diarrhea. The patient reports a major heart attack in December 2017 and recent ECHO. Patient has also increased the dose of her Lisinopril and Carvedilol recently.    Past Medical History:  Diagnosis Date  . Allergy   . Chicken pox   . Chronic back pain   . DDD (degenerative disc disease), lumbar   . Depression   . Heart attack   . Hypertension   . Migraines   . Syncope 09/12/2016    Patient Active Problem List   Diagnosis Date Noted  . Postural dizziness with near syncope 11/26/2016  . Chronic combined systolic and diastolic CHF, NYHA class 2 (HCC) 11/26/2016  . Chest pain 11/26/2016  . Acute bronchitis 10/17/2016  . Acute combined systolic and diastolic heart failure (HCC) 09/26/2016  . STEMI involving left anterior descending coronary artery (HCC) 09/23/2016  . Hypotension 09/14/2016  . Passive suicidal  ideations   . 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 Anxiety 01/13/2016  . Chronic pain 11/07/2015  . Essential hypertension 11/07/2015  . Insomnia 11/07/2015  . Depression 11/07/2015  . Facet syndrome, lumbar 03/11/2015  . Lumbar radiculopathy 03/11/2015  . Bilateral occipital neuralgia 02/07/2015  . 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 DDD (degenerative disc disease), lumbosacral 01/28/2015  . 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 CARDIAC CATHETERIZATION N/A 09/23/2016   Procedure: Left Heart Cath and Coronary Angiography;  Surgeon: Yvonne Kendall, MD;  Location: [facility] INVASIVE CV LAB;  Service: Cardiovascular;  Laterality: N/A;  . CARDIAC CATHETERIZATION N/A 09/23/2016   Procedure: Coronary Stent Intervention;  Surgeon: Yvonne Kendall, MD;  Location: [facility] INVASIVE CV LAB;  Service: Cardiovascular;  Laterality: N/A;  . CERVICAL DISC SURGERY    . TUBAL LIGATION        Allergies Pregabalin and Codeine  Family History  Problem Relation Age of Onset  . Hypertension Mother   . Arthritis Mother   . Hyperlipidemia Mother   . Mental illness Mother   . Hypertension Maternal Grandmother   . Heart disease Maternal Grandmother     Social History Social History  Substance Use Topics  . Smoking status: Former Smoker    Packs/day: 0.75    Years: 30.00    Types: Cigarettes    Quit date: 09/23/2016  . Smokeless tobacco: Never Used  . Alcohol use No  Review of Systems  Constitutional: No fever/chills Eyes: No visual changes. ENT: No sore throat. Cardiovascular: Positive chest pain and near-syncope.  Respiratory: Positive shortness of breath. Gastrointestinal: No abdominal pain.  No nausea, no vomiting.  No diarrhea.  No constipation. Genitourinary: Negative for dysuria. Musculoskeletal: Negative for back pain. Skin: Negative for rash. Neurological: Negative for headaches, focal weakness or numbness.  10-point ROS  otherwise negative.  ____________________________________________   PHYSICAL EXAM:  VITAL SIGNS: ED Triage Vitals  Enc Vitals Group     BP 11/26/16 1547 (!) 74/55     Pulse Rate 11/26/16 1547 74     Resp 11/26/16 1547 24     Temp 11/26/16 1551 97.7 F (36.5 C)     Temp Source 11/26/16 1551 Oral     SpO2 11/26/16 1547 100 %     Pain Score 11/26/16 1557 10   Constitutional: Alert and oriented. Well appearing and in no acute distress. Eyes: Conjunctivae are normal.  Head: Atraumatic. Nose: No congestion/rhinnorhea. Mouth/Throat: Mucous membranes are very dry. Oropharynx non-erythematous. Neck: No stridor.  Cardiovascular: Normal rate, regular rhythm. Good peripheral circulation. Grossly normal heart sounds.   Respiratory: Normal respiratory effort.  No retractions. Lungs with faint crackles at the bases.  Gastrointestinal: Soft and nontender. No distention.  Musculoskeletal: No lower extremity tenderness nor edema. No gross deformities of extremities. Neurologic:  Normal speech and language. No gross focal neurologic deficits are appreciated.  Skin:  Skin is warm, dry and intact. No rash noted. Psychiatric: Mood and affect are normal. Speech and behavior are normal.  ____________________________________________   LABS (all labs ordered are listed, but only abnormal results are displayed)  Labs Reviewed  COMPREHENSIVE METABOLIC PANEL - Abnormal; Notable for the following:       Result Value   Potassium 2.9 (*)    Creatinine, Ser 1.11 (*)    Alkaline Phosphatase 242 (*)    GFR calc non Af Amer 56 (*)    All other components within normal limits  BRAIN NATRIURETIC PEPTIDE - Abnormal; Notable for the following:    B Natriuretic Peptide 160.4 (*)    All other components within normal limits  LIPASE, BLOOD  CBC WITH DIFFERENTIAL/PLATELET  HIV ANTIBODY (ROUTINE TESTING)  CBC  CREATININE, SERUM  TROPONIN I  TROPONIN I  TROPONIN I  BASIC METABOLIC PANEL  MAGNESIUM    I-STAT TROPOININ, ED   ____________________________________________  EKG   EKG Interpretation  Date/Time:  Sunday November 26 2016 15:49:16 EST Ventricular Rate:  71 PR Interval:  126 QRS Duration: 60 QT Interval:  448 QTC Calculation: 486 R Axis:   84 Text Interpretation:  Normal sinus rhythm Possible Left atrial enlargement Anteroseptal infarct , age undetermined ST & T wave abnormality, consider lateral ischemia Abnormal ECG st elevation on prior ECG not noted on current ECG Confirmed by KNAPP  MD-J, JON ([id number]) on 11/26/2016 3:57:10 PM       ____________________________________________  RADIOLOGY  Dg Chest 2 View  Result Date: 11/26/2016 CLINICAL DATA:  Shortness of breath. EXAM: CHEST  2 VIEW COMPARISON:  September 23, 2016 FINDINGS: The heart size and mediastinal contours are within normal limits. Both lungs are clear. The visualized skeletal structures are unremarkable. IMPRESSION: No active cardiopulmonary disease. Electronically Signed   By: Gerome Sam III M.D   On: 11/26/2016 16:47   Ct Angio Chest Pe W And/or Wo Contrast  Result Date: 11/26/2016 CLINICAL DATA:  Shortness of breath.  Syncope. EXAM: CT ANGIOGRAPHY CHEST WITH CONTRAST TECHNIQUE: Multidetector  CT imaging of the chest was performed using the standard protocol during bolus administration of intravenous contrast. Multiplanar CT image reconstructions and MIPs were obtained to evaluate the vascular anatomy. CONTRAST:  100 mL of Isovue 370 COMPARISON:  Chest x-ray from earlier today FINDINGS: Cardiovascular: A left coronary stent is identified. Minimal atherosclerotic change in the thoracic aorta with no aneurysm or dissection. No pulmonary emboli identified. The heart size is normal. Mediastinum/Nodes: No enlarged mediastinal, hilar, or axillary lymph nodes. Thyroid gland, trachea, and esophagus demonstrate no significant findings. Lungs/Pleura: The central airways are within normal limits. No pneumothorax. Mild  emphysematous changes in the apices. Mild dependent atelectasis. No evidence of pneumonia. No mass or suspicious nodule identified. Upper Abdomen: No acute abnormality. Musculoskeletal: No chest wall abnormality. No acute or significant osseous findings. Review of the MIP images confirms the above findings. IMPRESSION: 1. No pulmonary emboli or acute abnormality. Electronically Signed   By: Gerome Samavid  Williams III M.D   On: 11/26/2016 19:04    ____________________________________________   PROCEDURES  Procedure(s) performed:   Procedures  CRITICAL CARE Performed by: Maia PlanJoshua G Rhea Thrun Total critical care time: 40 minutes Critical care time was exclusive of separately billable procedures and treating other patients. Critical care was necessary to treat or prevent imminent or life-threatening deterioration. Critical care was time spent personally by me on the following activities: development of treatment plan with patient and/or surrogate as well as nursing, discussions with consultants, evaluation of patient's response to treatment, examination of patient, obtaining history from patient or surrogate, ordering and performing treatments and interventions, ordering and review of laboratory studies, ordering and review of radiographic studies, pulse oximetry and re-evaluation of patient's condition.  Alona BeneJoshua Scot Shiraishi, MD Emergency Medicine  ____________________________________________   INITIAL IMPRESSION / ASSESSMENT AND PLAN / ED COURSE  Pertinent labs & imaging results that were available during my care of the patient were reviewed by me and considered in my medical decision making (see chart for details).  Patient resents to the emergency department for evaluation of dyspnea, chest tightness, near-syncope in the setting of recent heart attack and resulting systolic heart failure. Patient is also increase the dose of her BP meds. She is hypotensive on arrival to the emergency department. During my  evaluation the patient has an SBP in the low 100s. No acute distress but does have some dyspnea and dry mucous membranes. Low suspicion for PE in the setting of recent AMI and new CHF diagnosis.   Patient with normal CXR and labs. CTA ordered with continued CP and dyspnea but was negative for PE or other acute process. Suspect polypharmacy is to blame for hypotension. Discussed case with hospitalist who will admit for further monitoring of BP.   Reviewed all labs, imaging, vital signs, and nursing notes.  ____________________________________________  FINAL CLINICAL IMPRESSION(S) / ED DIAGNOSES  Final diagnoses:  Dyspnea, unspecified type  Hypotension, unspecified hypotension type  Chest pain, unspecified type     MEDICATIONS GIVEN DURING THIS VISIT:  Medications  FLUoxetine (PROZAC) capsule 40 mg (not administered)  tiZANidine (ZANAFLEX) tablet 2 mg (not administered)  zolpidem (AMBIEN) tablet 10 mg (not administered)  aspirin chewable tablet 81 mg (not administered)  atorvastatin (LIPITOR) tablet 80 mg (not administered)  nitroGLYCERIN (NITROSTAT) SL tablet 0.4 mg (not administered)  ticagrelor (BRILINTA) tablet 90 mg (not administered)  buPROPion (WELLBUTRIN XL) 24 hr tablet 150 mg (not administered)  potassium chloride (K-DUR) CR tablet 20 mEq (not administered)  morphine (MS CONTIN) 12 hr tablet 15 mg (not  administered)  heparin injection 5,000 Units (not administered)  sodium chloride flush (NS) 0.9 % injection 3 mL (not administered)  0.9 %  sodium chloride infusion (not administered)  potassium chloride 30 mEq in sodium chloride 0.9 % 265 mL (KCL MULTIRUN) IVPB (30 mEq Intravenous Given 11/26/16 2147)  potassium chloride (K-DUR,KLOR-CON) CR tablet 30 mEq (not administered)  aspirin chewable tablet 243 mg (243 mg Oral Given 11/26/16 1733)  sodium chloride 0.9 % bolus 500 mL (0 mLs Intravenous Stopped 11/26/16 2100)  iopamidol (ISOVUE-370) 76 % injection (100 mLs  Contrast Given  11/26/16 1826)     NEW OUTPATIENT MEDICATIONS STARTED DURING THIS VISIT:  None   Note:  This document was prepared using Dragon voice recognition software and may include unintentional dictation errors.  Alona Bene, MD Emergency Medicine   Maia Plan, MD 11/26/16 2232

## 2016-11-26 NOTE — ED Notes (Signed)
Patient is stable and ready to be transport to the floor at this time.  Report was called to 2W RN.  Belongings taken with the patient to the floor.   

## 2016-11-26 NOTE — ED Notes (Signed)
Pt returned from CT.  Now reports slight tingling to both sides of mouth and bilateral hands.  No neuro deficits.  EDP notified of same.

## 2016-11-26 NOTE — H&P (Signed)
History and Physical    Crystal [facility]:[id number] DOB: [birth year] DOA: 11/26/2016  PCP: Crystal Sams, DO  Patient coming from: Home  Chief Complaint: Near syncopal event  HPI: Crystal [facility] is a 51 y.o. female with medical history significant of ischemic cardiomyopathy with ejection fraction of 30%, hypertension, chronic back pain comes to the ED with complaints of near syncope this afternoon. Patient states she had been feeling short of breath for past 3 or 4 days which progressively worsened to a point where she could not even lay flat therefore she ended up calling her PCP yesterday who prescribed her Lasix and potassium. She tookher Lasix and potassium which made her urinate several times and felt better. She was able to sleep last night but then this morning she felt weak and early in the afternoon while she was there and when he sat down in a recliner she became lightheaded and almost passed out on her chair. She checked her blood pressure at the time which was 60s over 30s therefore ended up calling her PCP Crystal [facility] come to the ER for evaluation. No other complaints  Patient was admitted here at the [facility] December 2017 with STEMI showing 99% mid LAD stenosis status post DES. She was started on dual antiplatelet therapy and was plan to maximize her medical therapy. 2 weeks ago her lisinopril dose was increased to 5 mg which she seemed to have tolerated well and her Coreg dose was increased to 6.25 mg earlier this past week. She has been following with Dr. Okey Dupre from cardiology and has a follow-up appointment this week again for possible evaluation of LifeVest.  ED Course: In the ER initially she was noted to have systolic blood pressure of 70s therefore she was given 500 mL of normal saline bolus which improved her blood pressure to systolic of 90s. Most of her labs and EKG was unremarkable except hypokalemia. CTA of the chest was done as she reported a mild shortness of breath despite of normal  physical exam and clear chest x-ray but it was negative for pulmonary embolism.  Review of Systems: As per HPI otherwise 10 point review of systems negative.    Past Medical History:  Diagnosis Date  . Allergy   . Chicken pox   . Chronic back pain   . DDD (degenerative disc disease), lumbar   . Depression   . Heart attack   . Hypertension   . Migraines   . Syncope 09/12/2016    Past Surgical History:  Procedure Laterality Date  . CARDIAC CATHETERIZATION N/A 09/23/2016   Procedure: Left Heart Cath and Coronary Angiography;  Surgeon: Crystal Kendall, MD;  Location: [facility] INVASIVE CV LAB;  Service: Cardiovascular;  Laterality: N/A;  . CARDIAC CATHETERIZATION N/A 09/23/2016   Procedure: Coronary Stent Intervention;  Surgeon: Crystal Kendall, MD;  Location: [facility] INVASIVE CV LAB;  Service: Cardiovascular;  Laterality: N/A;  . CERVICAL DISC SURGERY    . TUBAL LIGATION       reports that she quit smoking about 2 months ago. Her smoking use included Cigarettes. She has a 22.50 pack-year smoking history. She has never used smokeless tobacco. She reports that she does not drink alcohol or use drugs.  Allergies  Allergen Reactions  . Pregabalin Shortness Of Breath, Swelling, Palpitations and Other (See Comments)    Other reaction(s): Tachycardia (finding)  . Codeine Nausea And Vomiting    Family History  Problem Relation Age of Onset  . Hypertension Mother   .  Arthritis Mother   . Hyperlipidemia Mother   . Mental illness Mother   . Hypertension Maternal Grandmother   . Heart disease Maternal Grandmother      Prior to Admission medications   Medication Sig Start Date [facility] Date Taking? Authorizing Provider  aspirin 81 MG chewable tablet Chew 1 tablet (81 mg total) by mouth daily. 09/27/16  Yes Crystal Bhagat, PA  atorvastatin (LIPITOR) 80 MG tablet Take 1 tablet (80 mg total) by mouth daily at 6 PM. 09/26/16  Yes Crystal Bhagat, PA  buPROPion (WELLBUTRIN XL) 150 MG 24 hr tablet  TAKE 1 TABLET BY MOUTH ONCE DAILY 10/27/16  Yes Crystal G Cook, DO  carvedilol (COREG) 6.25 MG tablet Take 1 tablet (6.25 mg total) by mouth 2 (two) times daily. 11/23/16  Yes Crystal End, MD  FLUoxetine (PROZAC) 20 MG capsule Take 2 capsules (40 mg total) by mouth daily. 01/12/16  Yes Crystal Sams, DO  furosemide (LASIX) 40 MG tablet Take 1 tablet (40 mg total) by mouth daily. 11/25/16  Yes Crystal Hora, PA-C  lidocaine (LIDODERM) 5 % Place 1 patch onto the skin daily. Apply 1-3 patches to skin  for 12 hours then Remove patches for 12 hours. Repeat process if tolerated Patient taking differently: Place 1 patch onto the skin daily as needed (pain). Apply 1-3 patches to skin  for 12 hours then Remove patches for 12 hours. Repeat process if tolerated 03/11/15  Yes Crystal Schlein, MD  lisinopril (PRINIVIL,ZESTRIL) 2.5 MG tablet Take 2 tablets (5 mg total) by mouth daily. Patient taking differently: Take 5 mg by mouth 2 (two) times daily.  11/09/16 02/07/17 Yes Crystal End, MD  morphine (MS CONTIN) 15 MG 12 hr tablet Take 15 mg by mouth every 12 (twelve) hours.   Yes Historical Provider, MD  nitroGLYCERIN (NITROSTAT) 0.4 MG SL tablet Place 1 tablet (0.4 mg total) under the tongue every 5 (five) minutes as needed for chest pain (CP or SOB). 09/26/16  Yes Crystal Bhagat, PA  potassium chloride (K-DUR) 10 MEQ tablet Take 2 tablets (20 mEq total) by mouth daily. 11/25/16  Yes Crystal Hora, PA-C  ticagrelor (BRILINTA) 90 MG TABS tablet Take 1 tablet (90 mg total) by mouth 2 (two) times daily. 09/26/16  Yes Crystal Bhagat, PA  tiZANidine (ZANAFLEX) 2 MG tablet Limit 1 tablet by mouth per day or 2 tablets by mouth per day if tolerated 05/01/16  Yes Crystal Schlein, MD  zolpidem (AMBIEN) 10 MG tablet Take 1 tablet (10 mg total) by mouth at bedtime as needed for sleep. 08/21/16  Yes Crystal Sams, DO    Physical Exam: Vitals:   11/26/16 1745 11/26/16 1800 11/26/16 1815 11/26/16 1901  BP: 98/63 102/68  107/66 112/69  Pulse: 63 (!) 58 (!) 59 67  Resp: 14 15 16 19   Temp:      TempSrc:      SpO2: 100% 100% 99% 94%      Constitutional: NAD, calm, comfortable Vitals:   11/26/16 1745 11/26/16 1800 11/26/16 1815 11/26/16 1901  BP: 98/63 102/68 107/66 112/69  Pulse: 63 (!) 58 (!) 59 67  Resp: 14 15 16 19   Temp:      TempSrc:      SpO2: 100% 100% 99% 94%   Eyes: PERRL, lids and conjunctivae normal ENMT: Mucous membranes are moist. Posterior pharynx clear of any exudate or lesions.Normal dentition.  Neck: normal, supple, no masses, no thyromegaly Respiratory: clear to auscultation bilaterally, no wheezing, no crackles. Normal  respiratory effort. No accessory muscle use.  Cardiovascular: Regular rate and rhythm, no murmurs / rubs / gallops. No extremity edema. 2+ pedal pulses. No carotid bruits.  Abdomen: no tenderness, no masses palpated. No hepatosplenomegaly. Bowel sounds positive.  Musculoskeletal: no clubbing / cyanosis. No joint deformity upper and lower extremities. Good ROM, no contractures. Normal muscle tone.  Skin: no rashes, lesions, ulcers. No induration Neurologic: CN 2-12 grossly intact. Sensation intact, DTR normal. Strength 5/5 in all 4.  Psychiatric: Normal judgment and insight. Alert and oriented x 3. Normal mood.   Labs on Admission: I have personally reviewed following labs and imaging studies  CBC:  Recent Labs Lab 11/26/16 1630  WBC 7.7  NEUTROABS 4.6  HGB 12.4  HCT 37.0  MCV 90.7  PLT 194   Basic Metabolic Panel:  Recent Labs Lab 11/26/16 1630  NA 137  K 2.9*  CL 101  CO2 25  GLUCOSE 99  BUN 7  CREATININE 1.11*  CALCIUM 9.1   GFR: CrCl cannot be calculated (Unknown ideal weight.). Liver Function Tests:  Recent Labs Lab 11/26/16 1630  AST 21  ALT 36  ALKPHOS 242*  BILITOT 0.8  PROT 6.5  ALBUMIN 3.5    Recent Labs Lab 11/26/16 1630  LIPASE 15   No results for input(s): AMMONIA in the last 168 hours. Coagulation Profile: No  results for input(s): INR, PROTIME in the last 168 hours. Cardiac Enzymes: No results for input(s): CKTOTAL, CKMB, CKMBINDEX, TROPONINI in the last 168 hours. BNP (last 3 results) No results for input(s): PROBNP in the last 8760 hours. HbA1C: No results for input(s): HGBA1C in the last 72 hours. CBG: No results for input(s): GLUCAP in the last 168 hours. Lipid Profile: No results for input(s): CHOL, HDL, LDLCALC, TRIG, CHOLHDL, LDLDIRECT in the last 72 hours. Thyroid Function Tests: No results for input(s): TSH, T4TOTAL, FREET4, T3FREE, THYROIDAB in the last 72 hours. Anemia Panel: No results for input(s): VITAMINB12, FOLATE, FERRITIN, TIBC, IRON, RETICCTPCT in the last 72 hours. Urine analysis:    Component Value Date/Time   COLORURINE STRAW (A) 09/14/2016 1108   APPEARANCEUR CLEAR 09/14/2016 1108   APPEARANCEUR Hazy 01/04/2012 1748   LABSPEC 1.008 09/14/2016 1108   LABSPEC 1.006 01/04/2012 1748   PHURINE 6.0 09/14/2016 1108   GLUCOSEU NEGATIVE 09/14/2016 1108   GLUCOSEU Negative 01/04/2012 1748   HGBUR SMALL (A) 09/14/2016 1108   BILIRUBINUR NEGATIVE 09/14/2016 1108   BILIRUBINUR Negative 01/04/2012 1748   KETONESUR NEGATIVE 09/14/2016 1108   PROTEINUR NEGATIVE 09/14/2016 1108   NITRITE NEGATIVE 09/14/2016 1108   LEUKOCYTESUR SMALL (A) 09/14/2016 1108   LEUKOCYTESUR Negative 01/04/2012 1748   Sepsis Labs: !!!!!!!!!!!!!!!!!!!!!!!!!!!!!!!!!!!!!!!!!!!! @LABRCNTIP (procalcitonin:4,lacticidven:4) )No results found for this or any previous visit (from the past 240 hour(s)).   Radiological Exams on Admission: Dg Chest 2 View  Result Date: 11/26/2016 CLINICAL DATA:  Shortness of breath. EXAM: CHEST  2 VIEW COMPARISON:  September 23, 2016 FINDINGS: The heart size and mediastinal contours are within normal limits. Both lungs are clear. The visualized skeletal structures are unremarkable. IMPRESSION: No active cardiopulmonary disease. Electronically Signed   By: Gerome Samavid  Williams III M.D    On: 11/26/2016 16:47   Ct Angio Chest Pe W And/or Wo Contrast  Result Date: 11/26/2016 CLINICAL DATA:  Shortness of breath.  Syncope. EXAM: CT ANGIOGRAPHY CHEST WITH CONTRAST TECHNIQUE: Multidetector CT imaging of the chest was performed using the standard protocol during bolus administration of intravenous contrast. Multiplanar CT image reconstructions and MIPs were obtained to  evaluate the vascular anatomy. CONTRAST:  100 mL of Isovue 370 COMPARISON:  Chest x-ray from earlier today FINDINGS: Cardiovascular: A left coronary stent is identified. Minimal atherosclerotic change in the thoracic aorta with no aneurysm or dissection. No pulmonary emboli identified. The heart size is normal. Mediastinum/Nodes: No enlarged mediastinal, hilar, or axillary lymph nodes. Thyroid gland, trachea, and esophagus demonstrate no significant findings. Lungs/Pleura: The central airways are within normal limits. No pneumothorax. Mild emphysematous changes in the apices. Mild dependent atelectasis. No evidence of pneumonia. No mass or suspicious nodule identified. Upper Abdomen: No acute abnormality. Musculoskeletal: No chest wall abnormality. No acute or significant osseous findings. Review of the MIP images confirms the above findings. IMPRESSION: 1. No pulmonary emboli or acute abnormality. Electronically Signed   By: Gerome Sam III M.D   On: 11/26/2016 19:04    EKG: Independently reviewed. No acute ischemic changes  Assessment/Plan Principal Problem:   Postural dizziness with near syncope Active Problems:   DDD (degenerative disc disease), lumbosacral   Essential hypertension   Depression   Anxiety   Hypotension   STEMI involving left anterior descending coronary artery (HCC)   Chronic combined systolic and diastolic CHF, NYHA class 2 (HCC)   Chest pain   Postural dizziness with near syncope-improved -Admit for further care -Likely secondary to dehydration from Lasix and increase the dose of lisinopril  and Coreg. -Gentle IV fluid hydration overnight -Hold antihypertensives for tonight. Restart when appropriate -Trend cardiac enzymes -CTA of the chest done which was negative for pulmonary embolism in the ED -No need for echo, had one done on 11/06/2016.  Hypo-kalemia -Likely secondary to Lasix use -Replete potassium -Check magnesium and repeat potassium again tomorrow morning  Chronic combined systolic and diastolic cardiomyopathy with ejection fraction of 30% secondary to recent MI -Recent echocardiogram shows ejection fraction of 3035% -Continue to follow with cardiology, maximize medical therapy at this time. Lisinopril and Coreg on hold for tonight while her blood pressure improves -She needs to be evaluated for LifeVest/ICD. Has an outpatient appointment with cardiology this week.  Essential hypertension -Holding antihypertensives at this time  Anxiety/depression -Continue home medicine  Degenerative disc disease -Pain control  DVT prophylaxis: Subcutaneous heparin Code Status: Full Family Communication: Patient comprehends well Disposition Plan: To be determined Consults called: None required at this time Admission status: Observation telemetry   Montravious Weigelt Joline Maxcy MD [facility] Pager [phone]  If 7PM-7AM, please contact night-coverage [email or web address] Password [facility]  11/26/2016, 8:36 PM

## 2016-11-26 NOTE — ED Notes (Signed)
[phone] Carolynn CommentJerry - Sig other. Call with any concerns or changes.

## 2016-11-26 NOTE — Progress Notes (Signed)
     Patient called on call service yesterday because she was having SOB and orthopnea. She has an EF of 30-35% and was not on diuretics. She really wanted to avoid coming into the ER so I called in lasix 40mg  daily and instructed her to call me back the following day. Of note, I thought I wrote a phone note but cannot seem to find it in the system.    Today she called to report that she was breathing a little better but then went to stand up and blacked out. Her husband took her BP and SBP was in 60s. She continues to feel unwell. I have asked her to come in to the ER for further evaluation and work up. She agreed.   Cline CrockKathryn Kanisha Duba PA-C  [facility]

## 2016-11-26 NOTE — ED Triage Notes (Signed)
Pt c/o shortness of breath started 2 days ago-- c/o worse now-- has felt dizzy and "almost passed out today--" BP was 60/40 at home per pt.

## 2016-11-27 ENCOUNTER — Telehealth: Payer: Self-pay | Admitting: Physician Assistant

## 2016-11-27 DIAGNOSIS — R42 Dizziness and giddiness: Secondary | ICD-10-CM

## 2016-11-27 DIAGNOSIS — I5042 Chronic combined systolic (congestive) and diastolic (congestive) heart failure: Secondary | ICD-10-CM

## 2016-11-27 DIAGNOSIS — I252 Old myocardial infarction: Secondary | ICD-10-CM

## 2016-11-27 DIAGNOSIS — R55 Syncope and collapse: Secondary | ICD-10-CM

## 2016-11-27 DIAGNOSIS — F419 Anxiety disorder, unspecified: Secondary | ICD-10-CM

## 2016-11-27 DIAGNOSIS — R0602 Shortness of breath: Secondary | ICD-10-CM

## 2016-11-27 LAB — BASIC METABOLIC PANEL
ANION GAP: 4 — AB (ref 5–15)
BUN: 7 mg/dL (ref 6–20)
CALCIUM: 8.2 mg/dL — AB (ref 8.9–10.3)
CHLORIDE: 111 mmol/L (ref 101–111)
CO2: 26 mmol/L (ref 22–32)
Creatinine, Ser: 0.93 mg/dL (ref 0.44–1.00)
GFR calc Af Amer: >60 mL/min (ref >60)
GFR calc non Af Amer: >60 mL/min (ref >60)
Glucose, Bld: 93 mg/dL (ref 65–99)
Potassium: 3.6 mmol/L (ref 3.5–5.1)
Sodium: 141 mmol/L (ref 135–145)

## 2016-11-27 LAB — MAGNESIUM: Magnesium: 1.7 mg/dL (ref 1.7–2.4)

## 2016-11-27 LAB — TROPONIN I: Troponin I: <0.03 ng/mL (ref <0.03)

## 2016-11-27 LAB — HIV ANTIBODY (ROUTINE TESTING W REFLEX): HIV SCREEN 4TH GENERATION: NONREACTIVE

## 2016-11-27 MED ORDER — CARVEDILOL 3.125 MG PO TABS
3.1250 mg | ORAL_TABLET | Freq: Two times a day (BID) | ORAL | Status: DC
  Administered 2016-11-27 – 2016-11-29 (×4): 3.125 mg via ORAL
  Filled 2016-11-27 (×4): qty 1

## 2016-11-27 MED ORDER — CLOPIDOGREL BISULFATE 75 MG PO TABS
75.0000 mg | ORAL_TABLET | Freq: Every day | ORAL | Status: DC
  Administered 2016-11-29: 75 mg via ORAL
  Filled 2016-11-27: qty 1

## 2016-11-27 MED ORDER — CLOPIDOGREL BISULFATE 75 MG PO TABS
300.0000 mg | ORAL_TABLET | Freq: Once | ORAL | Status: AC
  Administered 2016-11-28: 300 mg via ORAL
  Filled 2016-11-27: qty 4

## 2016-11-27 NOTE — Consult Note (Signed)
Cardiology Consult    Patient ID: DESERAE JENNINGS MRN: [id number], DOB/AGE: 1966/04/07   Admit date: 11/26/2016 Date of Consult: 11/27/2016  Primary Physician: Coral Spikes, DO Primary Cardiologist: Dr. Saunders Revel Requesting Provider: Dr. Eliseo Squires  Reason for Consult: Pre-syncope vs syncope  Patient Profile    Ms Villatoro is a 51 yo female with a PMH significant for ischemic cardiomyopathy with EF 30-35% secondary to anterior STEMI (08/2016) s/p DES to LAD, HTN, and anxiety/depression.  She is known to our service and was last seen in clinic on 09/26/16. She called the on-call provider over the weekend (11/25/16 - 11/26/16) for help managing her worsening SOB and orthopnea. She was prescribed lasix which seemed to improve her breathing; however, she had a possible syncopal episode at home on 11/26/16 and she was asked to come to the ED. Cardiology was consulted to further manage her possible syncope.  Past Medical History   Past Medical History:  Diagnosis Date  . Allergy   . Chicken pox   . Chronic back pain   . DDD (degenerative disc disease), lumbar   . Depression   . Heart attack   . Hypertension   . Migraines   . Syncope 09/12/2016    Past Surgical History:  Procedure Laterality Date  . CARDIAC CATHETERIZATION N/A 09/23/2016   Procedure: Left Heart Cath and Coronary Angiography;  Surgeon: Nelva Bush, MD;  Location: [facility];  Service: Cardiovascular;  Laterality: N/A;  . CARDIAC CATHETERIZATION N/A 09/23/2016   Procedure: Coronary Stent Intervention;  Surgeon: Nelva Bush, MD;  Location: [facility];  Service: Cardiovascular;  Laterality: N/A;  . CERVICAL DISC SURGERY    . TUBAL LIGATION       Allergies  Allergies  Allergen Reactions  . Pregabalin Shortness Of Breath, Swelling, Palpitations and Other (See Comments)    Other reaction(s): Tachycardia (finding)  . Codeine Nausea And Vomiting    History of Present Illness    Ms Couse is a 51 yo female with a PMH  significant for ischemic cardiomyopathy with LVEF 30-35%, anterior STEMI (08/2016) s/p DES to mid-LAD, HTN, migraines, and anxiety/depression. Following the STEMI episode, she was found to have systolic heart failure with LVEF 25-35%.  She is known to our service and was last seen in clinic on 10/05/16. At that time, cath site was examined. Repeat echo to assess function completed on 11/06/16 and showed LVEF of 30-35% and moderate to severe mitral regurgitation. Our office contacted her and increased her lisinopril (11/09/16) and coreg on (11/23/16).   She called the on-call provider over the weekend (11/25/16 - 11/26/16) for help managing worsening SOB and orthopnea. She has a low EF and was not on a diuretic regimen. Weekend provider prescribed lasix/K which seemed to improve her breathing; however, she had a possible syncopal episode at home on 11/26/16 and she was asked to come to the ED. Cardiology was consulted to further manage her possible syncope.  On my interview, the pt states that at approximately noon on 11/26/16, she got up from recliner to adjust a floor fan, walked three feet, and states "everything went black" and she fell back into the recliner. She does not know if she lost consciousness or not. She checked her pressure and states her sBP was in the 60s so she reported to the ED. She says she did have some chest tightness at the time of the pre-syncopal episode, but no other associated symptoms. On arrival in the ED, CXR  without pleural effusion or edema. CTA without PE.  She currently denies chest tightness or discomfort and palpitations. She states she has had SOB since her STEMI, but it has recently worsened. On exam, she appears near euvolemic. She denies chest pain.   Inpatient Medications    . aspirin  81 mg Oral Daily  . atorvastatin  80 mg Oral q1800  . buPROPion  150 mg Oral Daily  . FLUoxetine  40 mg Oral Daily  . heparin  5,000 Units Subcutaneous Q8H  . morphine  15 mg Oral Q12H  .  potassium chloride  20 mEq Oral Daily  . sodium chloride flush  3 mL Intravenous Q12H  . ticagrelor  90 mg Oral BID  . tiZANidine  2 mg Oral QHS     Outpatient Medications    Prior to Admission medications   Medication Sig Start Date End Date Taking? Authorizing Provider  aspirin 81 MG chewable tablet Chew 1 tablet (81 mg total) by mouth daily. 09/27/16  Yes Bhavinkumar Bhagat, PA  atorvastatin (LIPITOR) 80 MG tablet Take 1 tablet (80 mg total) by mouth daily at 6 PM. 09/26/16  Yes Bhavinkumar Bhagat, PA  buPROPion (WELLBUTRIN XL) 150 MG 24 hr tablet TAKE 1 TABLET BY MOUTH ONCE DAILY 10/27/16  Yes Jayce G Cook, DO  carvedilol (COREG) 6.25 MG tablet Take 1 tablet (6.25 mg total) by mouth 2 (two) times daily. 11/23/16  Yes Christopher End, MD  FLUoxetine (PROZAC) 20 MG capsule Take 2 capsules (40 mg total) by mouth daily. 01/12/16  Yes Coral Spikes, DO  furosemide (LASIX) 40 MG tablet Take 1 tablet (40 mg total) by mouth daily. 11/25/16  Yes Eileen Stanford, PA-C  lidocaine (LIDODERM) 5 % Place 1 patch onto the skin daily. Apply 1-3 patches to skin  for 12 hours then Remove patches for 12 hours. Repeat process if tolerated Patient taking differently: Place 1 patch onto the skin daily as needed (pain). Apply 1-3 patches to skin  for 12 hours then Remove patches for 12 hours. Repeat process if tolerated 03/11/15  Yes Mohammed Kindle, MD  lisinopril (PRINIVIL,ZESTRIL) 2.5 MG tablet Take 2 tablets (5 mg total) by mouth daily. Patient taking differently: Take 5 mg by mouth 2 (two) times daily.  11/09/16 02/07/17 Yes Christopher End, MD  morphine (MS CONTIN) 15 MG 12 hr tablet Take 15 mg by mouth every 12 (twelve) hours.   Yes Historical Provider, MD  nitroGLYCERIN (NITROSTAT) 0.4 MG SL tablet Place 1 tablet (0.4 mg total) under the tongue every 5 (five) minutes as needed for chest pain (CP or SOB). 09/26/16  Yes Bhavinkumar Bhagat, PA  potassium chloride (K-DUR) 10 MEQ tablet Take 2 tablets (20 mEq total) by  mouth daily. 11/25/16  Yes Eileen Stanford, PA-C  ticagrelor (BRILINTA) 90 MG TABS tablet Take 1 tablet (90 mg total) by mouth 2 (two) times daily. 09/26/16  Yes Bhavinkumar Bhagat, PA  tiZANidine (ZANAFLEX) 2 MG tablet Limit 1 tablet by mouth per day or 2 tablets by mouth per day if tolerated 05/01/16  Yes Mohammed Kindle, MD  zolpidem (AMBIEN) 10 MG tablet Take 1 tablet (10 mg total) by mouth at bedtime as needed for sleep. 08/21/16  Yes Coral Spikes, DO     Family History    Family History  Problem Relation Age of Onset  . Hypertension Mother   . Arthritis Mother   . Hyperlipidemia Mother   . Mental illness Mother   . Hypertension Maternal Grandmother   .  Heart disease Maternal Grandmother     Social History    Social History   Social History  . Marital status: Divorced    Spouse name: N/A  . Number of children: N/A  . Years of education: N/A   Occupational History  . Not on file.   Social History Main Topics  . Smoking status: Former Smoker    Packs/day: 0.75    Years: 30.00    Types: Cigarettes    Quit date: 09/23/2016  . Smokeless tobacco: Never Used  . Alcohol use No  . Drug use: No  . Sexual activity: Not Currently   Other Topics Concern  . Not on file   Social History Narrative  . No narrative on file     Review of Systems    General:  No chills, fever, night sweats or weight changes.  Cardiovascular:  No chest pain, dema, palpitations, Positive for dyspnea on exertion and paroxysmal nocturnal dyspnea. Dermatological: No rash, lesions/masses Respiratory: No cough, Positive dyspnea and orthopnea Urologic: No hematuria, dysuria Abdominal:   No nausea, vomiting, diarrhea, bright red blood per rectum, melena, or hematemesis Neurologic:  No visual changes, wkns, changes in mental status. All other systems reviewed and are otherwise negative except as noted above.  Physical Exam    Blood pressure (!) 96/57, pulse 61, temperature 98.4 F (36.9 C),  temperature source Oral, resp. rate 14, height 5' (1.524 m), weight 134 lb 14.4 oz (61.2 kg), SpO2 100 %.  General: Pleasant, NAD Psych: Normal affect. Neuro: Alert and oriented X 3. Moves all extremities spontaneously. HEENT: Normal  Neck: Supple without bruits or JVD. Lungs:  Resp regular and unlabored, CTA. Heart: RRR no s3, s4, or murmurs. Abdomen: Soft, non-tender, non-distended, BS + x 4.  Extremities: No clubbing, cyanosis, Trace edema. DP/PT/Radials 2+ and equal bilaterally.  Labs    Troponin [facility] Test)  Recent Labs  11/26/16 1632  TROPIPOC 0.02    Recent Labs  11/26/16 2231 11/27/16 0402  TROPONINI 0.03* <0.03   Lab Results  Component Value Date   WBC 8.6 11/26/2016   HGB 12.4 11/26/2016   HCT 37.8 11/26/2016   MCV 92.2 11/26/2016   PLT 199 11/26/2016    Recent Labs Lab 11/26/16 1630  11/27/16 0402  NA 137  --  141  K 2.9*  --  3.6  CL 101  --  111  CO2 25  --  26  BUN 7  --  7  CREATININE 1.11*  < > 0.93  CALCIUM 9.1  --  8.2*  PROT 6.5  --   --   BILITOT 0.8  --   --   ALKPHOS 242*  --   --   ALT 36  --   --   AST 21  --   --   GLUCOSE 99  --  93  < > = values in this interval not displayed. Lab Results  Component Value Date   CHOL 240 (H) 09/23/2016   HDL 53 09/23/2016   LDLCALC 156 (H) 09/23/2016   TRIG 153 (H) 09/23/2016   No results found for: [facility]   Radiology Studies    Dg Chest 2 View  Result Date: 11/26/2016 CLINICAL DATA:  Shortness of breath. EXAM: CHEST  2 VIEW COMPARISON:  September 23, 2016 FINDINGS: The heart size and mediastinal contours are within normal limits. Both lungs are clear. The visualized skeletal structures are unremarkable. IMPRESSION: No active cardiopulmonary disease. Electronically Signed   By:  Dorise Bullion III M.D   On: 11/26/2016 16:47   Ct Angio Chest Pe W And/or Wo Contrast  Result Date: 11/26/2016 CLINICAL DATA:  Shortness of breath.  Syncope. EXAM: CT ANGIOGRAPHY CHEST WITH CONTRAST  TECHNIQUE: Multidetector CT imaging of the chest was performed using the standard protocol during bolus administration of intravenous contrast. Multiplanar CT image reconstructions and MIPs were obtained to evaluate the vascular anatomy. CONTRAST:  100 mL of Isovue 370 COMPARISON:  Chest x-ray from earlier today FINDINGS: Cardiovascular: A left coronary stent is identified. Minimal atherosclerotic change in the thoracic aorta with no aneurysm or dissection. No pulmonary emboli identified. The heart size is normal. Mediastinum/Nodes: No enlarged mediastinal, hilar, or axillary lymph nodes. Thyroid gland, trachea, and esophagus demonstrate no significant findings. Lungs/Pleura: The central airways are within normal limits. No pneumothorax. Mild emphysematous changes in the apices. Mild dependent atelectasis. No evidence of pneumonia. No mass or suspicious nodule identified. Upper Abdomen: No acute abnormality. Musculoskeletal: No chest wall abnormality. No acute or significant osseous findings. Review of the MIP images confirms the above findings. IMPRESSION: 1. No pulmonary emboli or acute abnormality. Electronically Signed   By: Dorise Bullion III M.D   On: 11/26/2016 19:04    ECG & Cardiac Imaging    EKG 11/27/16: NSR with diffuse T wave inversion  Echocardiogram 11/06/16: Study Conclusions - Procedure narrative: Transthoracic echocardiography. Image   quality was adequate. Intravenous contrast (Definity) was   administered. - Left ventricle: LVEF is approximately 30 to 35% with severe   hypokinesis of the mid/distal septal, the distal lateral, distal   anterior and apical walls. THis is significantly depressed from   echo of December 2017. - Mitral valve: There was moderate to severe regurgitation. - Left atrium: The atrium was mildly dilated. - Pulmonary arteries: PA peak pressure: 52 mm Hg (S). - Pericardium, extracardiac: A trivial pericardial effusion was   identified.   Left Heart  Catheterization 09/23/16: Conclusions: 1. Anterior STEMI with 99% mid LAD stenosis with TIMI-2 flow. 2. Mild to moderate, non-obstructive coronary artery disease involving the ostial and distal LAD, ostial and mid LCx, and mid/distal RCA. 3. Severely reduced left ventricular contraction with anterior, apical, and apical inferior akinesis.  LVEF 25-35%. 4. Moderately increased left ventricular filling pressure (LVEDP 27 mmHg).  Recommendations: 1. Dual antiplatelet therapy for at least 12 months. 2. Discontinue cangrelor 2 hours after ticagrelor 180 mg was given. 3. Aggressive secondary prevention, including high-intensity statin therapy and tobacco cessation. 4. Replete potassium and recheck BMP at 1500. 5. Consider gentle hydration once potassium has been adequately repleted. 6. Initiate carvedilol 3.125 mg BID; add lisinopril as blood pressure allows. 7. Remove right femoral artery sheath once ACT has fallen below 175 seconds.   Assessment & Plan    1. Near syncope vs syncope - pt states she "blacked out" after standing up yesterday at home, but is unsure if she lost consciousness. She denies hitting her head and states she fell back into a recliner and yelled for help - this episode may be due to orthostatic hypotension given her recent increase in lisinopril and coreg and the addition of lasix for worsening SOB and orthopnea - Orthostatics performed here without lasix, ACEI, or coreg onboard.  - she is also on chronic opioid therapy (MS contin), which can also contribute to hypotension   2. Chronic Systolic heart failure/ischemic cardiomyopathy - LVEF 30-35%; BNP on arrival was 160 - home meds include ASA, Brilinta, lipitor, lisinopril 5 mg daily, coreg  6.25 mg BID - coreg and lisinopril have not been restarted this admission - orthostatics performed, no significant drop in BP with standing.  - BP 96/57 - 139/77 - on exam, she does not appear volume overloaded, although she is  not on lasix and is net positive 700cc. She has trace edema. She sats 99% on room air, but states she must wear supplemental O2 to feel comfortable and not SOB.  - recommend adding one agent back at at time. Given her CHF, she would benefit from lisinopril, coreg, and lasix. Will likely need lower doses of ACEI and coreg in order to add on a low dose of lasix.  - recommend started 3.125 coreg BID today 11/27/16; if this is tolerated, add 2.5 mg lisinopril tomorrow 11/28/16 - eventually, will need to add 20 mg lasix PRN with a 2-3 lb weight gain in one day, with supplemental K, depending on fluid status   3. Shortness of breath - recommend incentive spirometer - CXR and exam not consistent with pleural effusion or edema; she appears near euvolemic - add a low dose of lasix PRN - could be secondary to mitral regurgitation, which is likely due to her low EF - she is not anemic, CTA without PE, CXR without effusion - SOB may be related to a side effect of Brilinta. Given that she is greater than one month past DES; will recommend transitioning to plavix:   give dose of Brilinta tonight  Tomorrow 11/28/16, hold all Brilinta and given 300 mg Plavix tomorrow morning  Starting Wed 11/29/16, begin 75 mg Plavix daily, continue to hold Brilinta     Signed, Ledora Bottcher, PA-C 11/27/2016, 10:28 AM   Personally seen and examined. Agree with above.  51 year old female whom I met during her anterior STEMI in December with ischemic cardiomyopathy EF 35%, functional mitral regurgitation with very soft murmur on exam today, here with shortness of breath, chronic systolic heart failure which has been quite persistent since her myocardial infarction requiring her to sleep in recliner at times and brief episode of near syncope likely secondary to orthostatic hypotension.  During her anterior STEMI hospitalization, her blood pressure was quite low and we had trouble increasing any of her beta blocker or ACE  inhibitor medications. Her left ventricular end-diastolic pressure was 27 at that time. Her echocardiogram repeated in February also showed continued reduction in ejection fraction of 35% and moderate to severe mitral regurgitation was noted with elevated pulmonary pressures suggestive of continued increased left ventricular end-diastolic pressures. She was administered Lasix right after increase in beta blocker and ACE inhibitor at home and blood pressures decreased.  Today on exam, I do not see any significant JVD, lungs are fairly clear, x-ray was also fairly clear with minimally increased BNP. I would like to exclude the possibility of Brilinta as cause for ongoing shortness of breath and transition her over to Plavix tomorrow with Plavix load as noted above.  If this does not help, given her chronic systolic heart failure, she may require gentle diuresis and ultimately may require right heart catheterization to help determine her cardiac output and wedge pressure to help guide therapy.   I also think that there is some anxiety component as well.  We will continue to follow closely.  Candee Furbish, MD

## 2016-11-27 NOTE — Progress Notes (Signed)
PROGRESS NOTE    Crystal Roberson  ZOX:[id number] DOB: 1966-08-21 DOA: 11/26/2016 PCP: Tommie SamsJayce G Cook, DO   [facility]     Brief Narrative:  Crystal Roberson is a 51 y.o. female with medical history significant of ischemic cardiomyopathy with ejection fraction of 30%, hypertension, chronic back pain comes to the ED with complaints of near syncope this afternoon. Patient states she had been feeling short of breath for past 3 or 4 days which progressively worsened to a point where she could not even lay flat therefore she ended up calling her PCP yesterday who prescribed her Lasix and potassium. She tookher Lasix and potassium which made her urinate several times and felt better. She was able to sleep last night but then this morning she felt weak and early in the afternoon while she was there and when he sat down in a recliner she became lightheaded and almost passed out on her chair. She checked her blood pressure at the time which was 60s over 30s therefore ended up calling her PCP Jacquenette ShoneLaster come to the ER for evaluation. No other complaints  Patient was admitted here at the end of December 2017 with STEMI showing 99% mid LAD stenosis status post DES. She was started on dual antiplatelet therapy and was plan to maximize her medical therapy. 2 weeks ago her lisinopril dose was increased to 5 mg which she seemed to have tolerated well and her Coreg dose was increased to 6.25 mg earlier this past week. She has been following with Dr. Okey DupreEnd from cardiology and has a follow-up appointment this week again for possible evaluation of LifeVest.  ED Course: In the ER initially she was noted to have systolic blood pressure of 70s therefore she was given 500 mL of normal saline bolus which improved her blood pressure to systolic of 90s. Most of her labs and EKG was unremarkable except hypokalemia. CTA of the chest was done as she reported a mild shortness of breath despite of normal physical exam and clear chest  x-ray but it was negative for pulmonary embolism.   Assessment & Plan:   Principal Problem:   Postural dizziness with near syncope Active Problems:   DDD (degenerative disc disease), lumbosacral   Essential hypertension   Depression   Anxiety   Hypotension   STEMI involving left anterior descending coronary artery (HCC)   Chronic combined systolic and diastolic CHF, NYHA class 2 (HCC)   Chest pain  SOB -started after cath -suspect from brilinta -cards consult to change to another agent if appropriate  Postural dizziness with near syncope-improved -Likely secondary to dehydration from Lasix  -Gentle IV fluid hydration overnight -Hold antihypertensives for tonight. Restart when appropriate -CTA of the chest done which was negative for pulmonary embolism in the ED -No need for echo, had one done on 11/06/2016. -orthos this AM ordered  Hypo-kalemia -Likely secondary to Lasix use -Replete potassium -Check magnesium and repeat potassium again tomorrow morning  Chronic combined systolic and diastolic cardiomyopathy with ejection fraction of 30% secondary to recent MI -Recent echocardiogram shows ejection fraction of 30-35% -Continue to follow with cardiology, maximize medical therapy at this time. Lisinopril and Coreg on hold for tonight while her blood pressure improves -She needs to be evaluated for LifeVest/ICD. Has an outpatient appointment with cardiology this week.  Essential hypertension -Holding antihypertensives at this time  Anxiety/depression -Continue home medicine  Degenerative disc disease -Pain control   DVT prophylaxis:  SQ Heparin  Code Status: Full Code  Family Communication: patient  Disposition Plan:     Consultants:   cards   Subjective: Has been SOB since cath  Objective: Vitals:   11/26/16 2100 11/26/16 2130 11/26/16 2232 11/27/16 0440  BP: 111/68 118/76 (!) 122/91 (!) 96/57  Pulse: 66 62 70 61  Resp: 12 (!) 8 14 14     Temp:   97.9 F (36.6 C) 98.4 F (36.9 C)  TempSrc:   Oral Oral  SpO2: 94% 93% 100% 100%  Weight:   61.2 kg (134 lb 14.4 oz)   Height:   5' (1.524 m)     Intake/Output Summary (Last 24 hours) at 11/27/16 1013 Last data filed at 11/27/16 0300  Gross per 24 hour  Intake            772.5 ml  Output                0 ml  Net            772.5 ml   Filed Weights   11/26/16 2232  Weight: 61.2 kg (134 lb 14.4 oz)    Examination:  General exam: Appears calm and comfortable  Respiratory system: Clear to auscultation. Respiratory effort normal. Cardiovascular system: S1 & S2 heard, RRR. No JVD, murmurs, rubs, gallops or clicks. No pedal edema. Gastrointestinal system: Abdomen is nondistended, soft and nontender. No organomegaly or masses felt. Normal bowel sounds heard. Central nervous system: Alert and oriented. No focal neurological deficits.    Data Reviewed: I have personally reviewed following labs and imaging studies  CBC:  Recent Labs Lab 11/26/16 1630 11/26/16 2231  WBC 7.7 8.6  NEUTROABS 4.6  --   HGB 12.4 12.4  HCT 37.0 37.8  MCV 90.7 92.2  PLT 194 199   Basic Metabolic Panel:  Recent Labs Lab 11/26/16 1630 11/26/16 2231 11/27/16 0402  NA 137  --  141  K 2.9*  --  3.6  CL 101  --  111  CO2 25  --  26  GLUCOSE 99  --  93  BUN 7  --  7  CREATININE 1.11* 1.01* 0.93  CALCIUM 9.1  --  8.2*  MG  --   --  1.7   GFR: Estimated Creatinine Clearance: 57.2 mL/min (by C-G formula based on SCr of 0.93 mg/dL). Liver Function Tests:  Recent Labs Lab 11/26/16 1630  AST 21  ALT 36  ALKPHOS 242*  BILITOT 0.8  PROT 6.5  ALBUMIN 3.5    Recent Labs Lab 11/26/16 1630  LIPASE 15   No results for input(s): AMMONIA in the last 168 hours. Coagulation Profile: No results for input(s): INR, PROTIME in the last 168 hours. Cardiac Enzymes:  Recent Labs Lab 11/26/16 2231 11/27/16 0402  TROPONINI 0.03* <0.03   BNP (last 3 results) No results for  input(s): PROBNP in the last 8760 hours. HbA1C: No results for input(s): HGBA1C in the last 72 hours. CBG: No results for input(s): GLUCAP in the last 168 hours. Lipid Profile: No results for input(s): CHOL, HDL, LDLCALC, TRIG, CHOLHDL, LDLDIRECT in the last 72 hours. Thyroid Function Tests: No results for input(s): TSH, T4TOTAL, FREET4, T3FREE, THYROIDAB in the last 72 hours. Anemia Panel: No results for input(s): VITAMINB12, FOLATE, FERRITIN, TIBC, IRON, RETICCTPCT in the last 72 hours. Urine analysis:    Component Value Date/Time   COLORURINE STRAW (A) 09/14/2016 1108   APPEARANCEUR CLEAR 09/14/2016 1108   APPEARANCEUR Hazy 01/04/2012 1748   LABSPEC 1.008 09/14/2016 1108   LABSPEC  1.006 01/04/2012 1748   PHURINE 6.0 09/14/2016 1108   GLUCOSEU NEGATIVE 09/14/2016 1108   GLUCOSEU Negative 01/04/2012 1748   HGBUR SMALL (A) 09/14/2016 1108   BILIRUBINUR NEGATIVE 09/14/2016 1108   BILIRUBINUR Negative 01/04/2012 1748   KETONESUR NEGATIVE 09/14/2016 1108   PROTEINUR NEGATIVE 09/14/2016 1108   NITRITE NEGATIVE 09/14/2016 1108   LEUKOCYTESUR SMALL (A) 09/14/2016 1108   LEUKOCYTESUR Negative 01/04/2012 1748     )No results found for this or any previous visit (from the past 240 hour(s)).    Anti-infectives    None       Radiology Studies: Dg Chest 2 View  Result Date: 11/26/2016 CLINICAL DATA:  Shortness of breath. EXAM: CHEST  2 VIEW COMPARISON:  September 23, 2016 FINDINGS: The heart size and mediastinal contours are within normal limits. Both lungs are clear. The visualized skeletal structures are unremarkable. IMPRESSION: No active cardiopulmonary disease. Electronically Signed   By: Gerome Sam III M.D   On: 11/26/2016 16:47   Ct Angio Chest Pe W And/or Wo Contrast  Result Date: 11/26/2016 CLINICAL DATA:  Shortness of breath.  Syncope. EXAM: CT ANGIOGRAPHY CHEST WITH CONTRAST TECHNIQUE: Multidetector CT imaging of the chest was performed using the standard protocol  during bolus administration of intravenous contrast. Multiplanar CT image reconstructions and MIPs were obtained to evaluate the vascular anatomy. CONTRAST:  100 mL of Isovue 370 COMPARISON:  Chest x-ray from earlier today FINDINGS: Cardiovascular: A left coronary stent is identified. Minimal atherosclerotic change in the thoracic aorta with no aneurysm or dissection. No pulmonary emboli identified. The heart size is normal. Mediastinum/Nodes: No enlarged mediastinal, hilar, or axillary lymph nodes. Thyroid gland, trachea, and esophagus demonstrate no significant findings. Lungs/Pleura: The central airways are within normal limits. No pneumothorax. Mild emphysematous changes in the apices. Mild dependent atelectasis. No evidence of pneumonia. No mass or suspicious nodule identified. Upper Abdomen: No acute abnormality. Musculoskeletal: No chest wall abnormality. No acute or significant osseous findings. Review of the MIP images confirms the above findings. IMPRESSION: 1. No pulmonary emboli or acute abnormality. Electronically Signed   By: Gerome Sam III M.D   On: 11/26/2016 19:04        Scheduled Meds: . aspirin  81 mg Oral Daily  . atorvastatin  80 mg Oral q1800  . buPROPion  150 mg Oral Daily  . FLUoxetine  40 mg Oral Daily  . heparin  5,000 Units Subcutaneous Q8H  . morphine  15 mg Oral Q12H  . potassium chloride  20 mEq Oral Daily  . sodium chloride flush  3 mL Intravenous Q12H  . ticagrelor  90 mg Oral BID  . tiZANidine  2 mg Oral QHS   Continuous Infusions: . sodium chloride 75 mL/hr at 11/27/16 0607     LOS: 0 days    Time spent: 25 min    JESSICA U VANN, DO [facility] Pager [phone]  If 7PM-7AM, please contact night-coverage [email or web address] Password [email or web address] 11/27/2016, 10:13 AM

## 2016-11-27 NOTE — Telephone Encounter (Signed)
     Somehow my phone notes were put into an orders only tab. I have copied and pasted the correspondences from 11/25/16 and 11/26/16    11/25/16 Patient called on call provider to report worsening SOB and orthopnea. She has EF of 30-35% and not on any diuretic. We discussed her coming into the ER vs trying to manage as an outpatient and she would like to try outpatient. I have called in lasix 40mg  daily and Kdur 20mEq daily to her pharmacy. She has been told to take 80mg  Lasix today followed by 40mg  daily and 40mEq Kdur followed by 20MEq daily. We discussed ER precautions. She will call me tomorrow to report how she is feeling or come into the ER if she significantly worsens. SHe has an appointment with Dr. Okey DupreEnd this Thursday. If she feels better, I will bring her in for a BMET on Monday.   11/26/16  Patient called on call service yesterday because she was having SOB and orthopnea. She has an EF of 30-35% and was not on diuretics. She really wanted to avoid coming into the ER so I called in lasix 40mg  daily and instructed her to call me back the following day.    Today she called to report that she was breathing a little better but then went to stand up and blacked out. Her husband took her BP and SBP was in 60s. She continues to feel unwell. I have asked her to come in to the ER for further evaluation and work up. She agreed.   Cline CrockKathryn Thos Matsumoto PA-C  [facility]

## 2016-11-28 MED ORDER — MAGNESIUM OXIDE 400 (241.3 MG) MG PO TABS
400.0000 mg | ORAL_TABLET | Freq: Two times a day (BID) | ORAL | Status: DC
  Administered 2016-11-28 – 2016-11-29 (×3): 400 mg via ORAL
  Filled 2016-11-28 (×3): qty 1

## 2016-11-28 MED ORDER — POLYETHYLENE GLYCOL 3350 17 G PO PACK: 17.0000 g | PACK | Freq: Every day | ORAL | Status: DC | PRN

## 2016-11-28 MED ORDER — SENNOSIDES-DOCUSATE SODIUM 8.6-50 MG PO TABS
2.0000 | ORAL_TABLET | Freq: Every day | ORAL | Status: DC
  Administered 2016-11-28: 2 via ORAL
  Filled 2016-11-28: qty 2

## 2016-11-28 MED ORDER — LISINOPRIL 2.5 MG PO TABS
2.5000 mg | ORAL_TABLET | Freq: Every day | ORAL | Status: DC
  Administered 2016-11-28 – 2016-11-29 (×2): 2.5 mg via ORAL
  Filled 2016-11-28 (×2): qty 1

## 2016-11-28 NOTE — Progress Notes (Signed)
Progress Note  Patient Name: Crystal Roberson Date of Encounter: 11/28/2016  Primary Cardiologist: Dr. Okey Dupre  Subjective   Patient is feeling well; denies chest pain, SOB, and palpitations. She also denies dizziness, lightheadedness, and pre-syncopal episodes.  Inpatient Medications    Scheduled Meds: . aspirin  81 mg Oral Daily  . atorvastatin  80 mg Oral q1800  . buPROPion  150 mg Oral Daily  . carvedilol  3.125 mg Oral BID WC  . [START ON 11/29/2016] clopidogrel  75 mg Oral Daily  . FLUoxetine  40 mg Oral Daily  . heparin  5,000 Units Subcutaneous Q8H  . morphine  15 mg Oral Q12H  . potassium chloride  20 mEq Oral Daily  . sodium chloride flush  3 mL Intravenous Q12H  . tiZANidine  2 mg Oral QHS   Continuous Infusions:  PRN Meds: nitroGLYCERIN, zolpidem   Vital Signs    Vitals:   11/27/16 1315 11/27/16 1720 11/27/16 1924 11/28/16 0404  BP: 139/77 (!) 141/69 131/70 121/72  Pulse: 75 68 66 62  Resp: 14  18 18   Temp: 99.2 F (37.3 C)  98 F (36.7 C) 98.4 F (36.9 C)  TempSrc: Oral  Oral Oral  SpO2: 99%  99% 100%  Weight:      Height:       No intake or output data in the 24 hours ending 11/28/16 4098 Filed Weights   11/26/16 2232  Weight: 134 lb 14.4 oz (61.2 kg)     Physical Exam   General: Well developed, well nourished, female appearing in no acute distress. Head: Normocephalic, atraumatic.  Neck: Supple without bruits, JVD Lungs:  Resp regular and unlabored, CTA. Heart: RRR, S1, S2, no S3, S4, or murmur; no rub. Abdomen: Soft, non-tender, non-distended with normoactive bowel sounds. No hepatomegaly. No rebound/guarding. No obvious abdominal masses. Extremities: No clubbing, cyanosis, Trac edema. Distal pedal pulses are 1+ bilaterally. Neuro: Alert and oriented X 3. Moves all extremities spontaneously. Psych: Normal affect.  Labs    Chemistry Recent Labs Lab 11/26/16 1630 11/26/16 2231 11/27/16 0402  NA 137  --  141  K 2.9*  --  3.6  CL 101  --   111  CO2 25  --  26  GLUCOSE 99  --  93  BUN 7  --  7  CREATININE 1.11* 1.01* 0.93  CALCIUM 9.1  --  8.2*  PROT 6.5  --   --   ALBUMIN 3.5  --   --   AST 21  --   --   ALT 36  --   --   ALKPHOS 242*  --   --   BILITOT 0.8  --   --   GFRNONAA 56* >60 >60  GFRAA >60 >60 >60  ANIONGAP 11  --  4*     Hematology Recent Labs Lab 11/26/16 1630 11/26/16 2231  WBC 7.7 8.6  RBC 4.08 4.10  HGB 12.4 12.4  HCT 37.0 37.8  MCV 90.7 92.2  MCH 30.4 30.2  MCHC 33.5 32.8  RDW 12.5 12.5  PLT 194 199    Cardiac Enzymes Recent Labs Lab 11/26/16 2231 11/27/16 0402 11/27/16 1014  TROPONINI 0.03* <0.03 <0.03    Recent Labs Lab 11/26/16 1632  TROPIPOC 0.02     BNP Recent Labs Lab 11/26/16 1630  BNP 160.4*     DDimer No results for input(s): DDIMER in the last 168 hours.   Radiology    Dg Chest 2 View  Result Date: 11/26/2016 CLINICAL DATA:  Shortness of breath. EXAM: CHEST  2 VIEW COMPARISON:  September 23, 2016 FINDINGS: The heart size and mediastinal contours are within normal limits. Both lungs are clear. The visualized skeletal structures are unremarkable. IMPRESSION: No active cardiopulmonary disease. Electronically Signed   By: Gerome Samavid  Williams III M.D   On: 11/26/2016 16:47   Ct Angio Chest Pe W And/or Wo Contrast  Result Date: 11/26/2016 CLINICAL DATA:  Shortness of breath.  Syncope. EXAM: CT ANGIOGRAPHY CHEST WITH CONTRAST TECHNIQUE: Multidetector CT imaging of the chest was performed using the standard protocol during bolus administration of intravenous contrast. Multiplanar CT image reconstructions and MIPs were obtained to evaluate the vascular anatomy. CONTRAST:  100 mL of Isovue 370 COMPARISON:  Chest x-ray from earlier today FINDINGS: Cardiovascular: A left coronary stent is identified. Minimal atherosclerotic change in the thoracic aorta with no aneurysm or dissection. No pulmonary emboli identified. The heart size is normal. Mediastinum/Nodes: No enlarged  mediastinal, hilar, or axillary lymph nodes. Thyroid gland, trachea, and esophagus demonstrate no significant findings. Lungs/Pleura: The central airways are within normal limits. No pneumothorax. Mild emphysematous changes in the apices. Mild dependent atelectasis. No evidence of pneumonia. No mass or suspicious nodule identified. Upper Abdomen: No acute abnormality. Musculoskeletal: No chest wall abnormality. No acute or significant osseous findings. Review of the MIP images confirms the above findings. IMPRESSION: 1. No pulmonary emboli or acute abnormality. Electronically Signed   By: Gerome Samavid  Williams III M.D   On: 11/26/2016 19:04     Telemetry    NSR in the 70s - Personally Reviewed  ECG    No new tracings - Personally Reviewed   Cardiac Studies   Echocardiogram 11/06/16: Study Conclusions - Procedure narrative: Transthoracic echocardiography. Image quality was adequate. Intravenous contrast (Definity) was administered. - Left ventricle: LVEF is approximately 30 to 35% with severe hypokinesis of the mid/distal septal, the distal lateral, distal anterior and apical walls. THis is significantly depressed from echo of December 2017. - Mitral valve: There was moderate to severe regurgitation. - Left atrium: The atrium was mildly dilated. - Pulmonary arteries: PA peak pressure: 52 mm Hg (S). - Pericardium, extracardiac: A trivial pericardial effusion was identified.   Left Heart Catheterization 09/23/16: Conclusions: 1. Anterior STEMI with 99% mid LAD stenosis with TIMI-2 flow. 2. Mild to moderate, non-obstructive coronary artery disease involving the ostial and distal LAD, ostial and mid LCx, and mid/distal RCA. 3. Severely reduced left ventricular contraction with anterior, apical, and apical inferior akinesis. LVEF 25-35%. 4. Moderately increased left ventricular filling pressure (LVEDP 27 mmHg).  Recommendations: 1. Dual antiplatelet therapy for at least 12  months. 2. Discontinue cangrelor 2 hours after ticagrelor 180 mg was given. 3. Aggressive secondary prevention, including high-intensity statin therapy and tobacco cessation. 4. Replete potassium and recheck BMP at 1500. 5. Consider gentle hydration once potassium has been adequately repleted. 6. Initiate carvedilol 3.125 mg BID; add lisinopril as blood pressure allows. 7. Remove right femoral artery sheath once ACT has fallen below 175 seconds.   Patient Profile     51 y.o. female  with a PMH significant for ischemic cardiomyopathy with EF 30-35% secondary to anterior STEMI (08/2016) s/p DES to LAD, HTN, and anxiety/depression.  She is known to our service and was last seen in clinic on 09/26/16. She called the on-call provider over the weekend (11/25/16 - 11/26/16) for help managing her worsening SOB and orthopnea. She was prescribed lasix which seemed to improve her breathing; however, she had  a possible syncopal episode at home on 11/26/16 and she was asked to come to the ED. Cardiology was consulted to further manage her possible syncope.  Assessment & Plan    1. Near syncope vs syncope - pt states she "blacked out" after standing up yesterday at home, but is unsure if she lost consciousness. She denies hitting her head and states she fell back into a recliner and yelled for help - this episode may be due to orthostatic hypotension given her recent increase in lisinopril and coreg and the addition of lasix for worsening SOB and orthopnea - Orthostatics performed here without lasix, ACEI, or coreg onboard were normal - she is also on chronic opioid therapy (MS contin), which can also contribute to hypotension - will repeat orthostatics today after starting low dose lisinopril and with low dose coreg onboard - troponin x 3 negative - Mg 1.7, added MagOx - pt denies further dizziness, lightheadedness, or pre-syncopal episodes   2. Chronic Systolic heart failure/ischemic cardiomyopathy - LVEF  30-35%; BNP on arrival was 160 - home meds include ASA, Brilinta, lipitor, lisinopril 5 mg daily, coreg 6.25 mg BID - orthostatics performed yesterday, no significant drop in BP with standing. Repeating.  - BP 96/57 - 139/77 - recommend adding one agent back at at time. Given her CHF, she would benefit from lisinopril, coreg, and lasix. Will likely need lower doses of ACEI and coreg in order to add on a low dose of lasix.  - started 3.125 mg BID coreg  - she is tolerating this well, will add 2.5 mg lisinopril daily today - will collect another set of orthostatics this afternoon - eventually, will need to add 20 mg lasix PRN with a 2-3 lb weight gain in one day, with supplemental K, depending on fluid status   3. Shortness of breath - recommend incentive spirometer - CXR yesterday and exam today not consistent with pleural effusion or edema; she appears near euvolemic - add a low dose of lasix PRN - could be secondary to mitral regurgitation, which is likely due to her low EF - she is not anemic, CTA without PE, CXR without effusion - SOB may be related to a side effect of Brilinta. Given that she is greater than one month past DES; will recommend transitioning to plavix:   Last dose of Brilinta last evening (11/27/16)  Gave 300 mg OTO dose of Plavix this morning (11/28/16)  Start 75 mg Plavix daily tomorrow (11/29/16)  - pt states she continues to have SOB, but satting 99-100% on room air - will continue to monitor through this transition from brilinta to plavix    Signed, Marcelino Duster , PA-C 9:52 AM 11/28/2016 Pager: [phone]  Personally seen and examined. Agree with above. Still with some dyspnea. Transitioned today to Plavix. ? If brilinta playing a role.  Perhaps her LVEDP remains elevated.  Murmur quite soft - question severity of MR Hypotension post MI EF 35% No significant JVD.  ? Low output? May ultimately need RHC to help guide [place].   Donato Schultz, MD

## 2016-11-28 NOTE — Progress Notes (Signed)
PROGRESS NOTE    Crystal Roberson  RUE:[id number] DOB: October 21, 1965 DOA: 11/26/2016 PCP: Tommie SamsJayce G Cook, DO   [facility]     Brief Narrative:  Crystal Roberson is a 51 y.o. female with medical history significant of ischemic cardiomyopathy with ejection fraction of 30%, hypertension, chronic back pain comes to the ED with complaints of near syncope this afternoon. Patient states she had been feeling short of breath for past 3 or 4 days which progressively worsened to a point where she could not even lay flat therefore she ended up calling her PCP yesterday who prescribed her Lasix and potassium. She tookher Lasix and potassium which made her urinate several times and felt better. She was able to sleep last night but then this morning she felt weak and early in the afternoon while she was there and when he sat down in a recliner she became lightheaded and almost passed out on her chair. She checked her blood pressure at the time which was 60s over 30s therefore ended up calling her PCP Jacquenette ShoneLaster come to the ER for evaluation. No other complaints  Patient was admitted here at the end of December 2017 with STEMI showing 99% mid LAD stenosis status post DES. She was started on dual antiplatelet therapy and was plan to maximize her medical therapy. 2 weeks ago her lisinopril dose was increased to 5 mg which she seemed to have tolerated well and her Coreg dose was increased to 6.25 mg earlier this past week. She has been following with Dr. Okey DupreEnd from cardiology and has a follow-up appointment this week again for possible evaluation of LifeVest.  ED Course: In the ER initially she was noted to have systolic blood pressure of 70s therefore she was given 500 mL of normal saline bolus which improved her blood pressure to systolic of 90s. Most of her labs and EKG was unremarkable except hypokalemia. CTA of the chest was done as she reported a mild shortness of breath despite of normal physical exam and clear chest  x-ray but it was negative for pulmonary embolism.   Assessment & Plan:   Principal Problem:   Postural dizziness with near syncope Active Problems:   DDD (degenerative disc disease), lumbosacral   Essential hypertension   Depression   Anxiety   Hypotension   STEMI involving left anterior descending coronary artery (HCC)   Chronic combined systolic and diastolic CHF, NYHA class 2 (HCC)   Chest pain  SOB -started after cath -brilinta changed to plavix -? Anxiety- 100% on RA but still c/o SOB  Postural dizziness with near syncope-improved -Likely secondary to dehydration from Lasix  -CTA of the chest done which was negative for pulmonary embolism in the ED -No need for echo, had one done on 11/06/2016.  Hypo-kalemia -Likely secondary to Lasix use -Repleted  Chronic combined systolic and diastolic cardiomyopathy with ejection fraction of 30% secondary to recent MI -Recent echocardiogram shows ejection fraction of 30-35% -resume ACE -She needs to be evaluated for LifeVest/ICD. Has an outpatient appointment with cardiology this week.  Essential hypertension -Holding antihypertensives at this time  Anxiety/depression -Continue home medicine  Degenerative disc disease -Pain control   DVT prophylaxis:  SQ Heparin  Code Status: Full Code   Family Communication: patient  Disposition Plan:     Consultants:   cards   Subjective: No dizziness but still feels SOB  Objective: Vitals:   11/27/16 1315 11/27/16 1720 11/27/16 1924 11/28/16 0404  BP: 139/77 (!) 141/69 131/70 121/72  Pulse: 75 68 66 62  Resp: 14  18 18   Temp: 99.2 F (37.3 C)  98 F (36.7 C) 98.4 F (36.9 C)  TempSrc: Oral  Oral Oral  SpO2: 99%  99% 100%  Weight:      Height:        Intake/Output Summary (Last 24 hours) at 11/28/16 1129 Last data filed at 11/28/16 0800  Gross per 24 hour  Intake              240 ml  Output                0 ml  Net              240 ml   Filed  Weights   11/26/16 2232  Weight: 61.2 kg (134 lb 14.4 oz)    Examination:  General exam: Appears calm and comfortable  Respiratory system: diminished, no wheezing Cardiovascular system: S1 & S2 heard, RRR. No JVD, murmurs, rubs, gallops or clicks. No pedal edema. Gastrointestinal system: Abdomen is nondistended, soft and nontender. No organomegaly or masses felt. Normal bowel sounds heard. Central nervous system: Alert and oriented. No focal neurological deficits.    Data Reviewed: I have personally reviewed following labs and imaging studies  CBC:  Recent Labs Lab 11/26/16 1630 11/26/16 2231  WBC 7.7 8.6  NEUTROABS 4.6  --   HGB 12.4 12.4  HCT 37.0 37.8  MCV 90.7 92.2  PLT 194 199   Basic Metabolic Panel:  Recent Labs Lab 11/26/16 1630 11/26/16 2231 11/27/16 0402  NA 137  --  141  K 2.9*  --  3.6  CL 101  --  111  CO2 25  --  26  GLUCOSE 99  --  93  BUN 7  --  7  CREATININE 1.11* 1.01* 0.93  CALCIUM 9.1  --  8.2*  MG  --   --  1.7   GFR: Estimated Creatinine Clearance: 57.2 mL/min (by C-G formula based on SCr of 0.93 mg/dL). Liver Function Tests:  Recent Labs Lab 11/26/16 1630  AST 21  ALT 36  ALKPHOS 242*  BILITOT 0.8  PROT 6.5  ALBUMIN 3.5    Recent Labs Lab 11/26/16 1630  LIPASE 15   No results for input(s): AMMONIA in the last 168 hours. Coagulation Profile: No results for input(s): INR, PROTIME in the last 168 hours. Cardiac Enzymes:  Recent Labs Lab 11/26/16 2231 11/27/16 0402 11/27/16 1014  TROPONINI 0.03* <0.03 <0.03   BNP (last 3 results) No results for input(s): PROBNP in the last 8760 hours. HbA1C: No results for input(s): HGBA1C in the last 72 hours. CBG: No results for input(s): GLUCAP in the last 168 hours. Lipid Profile: No results for input(s): CHOL, HDL, LDLCALC, TRIG, CHOLHDL, LDLDIRECT in the last 72 hours. Thyroid Function Tests: No results for input(s): TSH, T4TOTAL, FREET4, T3FREE, THYROIDAB in the last 72  hours. Anemia Panel: No results for input(s): VITAMINB12, FOLATE, FERRITIN, TIBC, IRON, RETICCTPCT in the last 72 hours. Urine analysis:    Component Value Date/Time   COLORURINE STRAW (A) 09/14/2016 1108   APPEARANCEUR CLEAR 09/14/2016 1108   APPEARANCEUR Hazy 01/04/2012 1748   LABSPEC 1.008 09/14/2016 1108   LABSPEC 1.006 01/04/2012 1748   PHURINE 6.0 09/14/2016 1108   GLUCOSEU NEGATIVE 09/14/2016 1108   GLUCOSEU Negative 01/04/2012 1748   HGBUR SMALL (A) 09/14/2016 1108   BILIRUBINUR NEGATIVE 09/14/2016 1108   BILIRUBINUR Negative 01/04/2012 1748   KETONESUR NEGATIVE 09/14/2016 1108  PROTEINUR NEGATIVE 09/14/2016 1108   NITRITE NEGATIVE 09/14/2016 1108   LEUKOCYTESUR SMALL (A) 09/14/2016 1108   LEUKOCYTESUR Negative 01/04/2012 1748     )No results found for this or any previous visit (from the past 240 hour(s)).    Anti-infectives    None       Radiology Studies: Dg Chest 2 View  Result Date: 11/26/2016 CLINICAL DATA:  Shortness of breath. EXAM: CHEST  2 VIEW COMPARISON:  September 23, 2016 FINDINGS: The heart size and mediastinal contours are within normal limits. Both lungs are clear. The visualized skeletal structures are unremarkable. IMPRESSION: No active cardiopulmonary disease. Electronically Signed   By: Gerome Sam III M.D   On: 11/26/2016 16:47   Ct Angio Chest Pe W And/or Wo Contrast  Result Date: 11/26/2016 CLINICAL DATA:  Shortness of breath.  Syncope. EXAM: CT ANGIOGRAPHY CHEST WITH CONTRAST TECHNIQUE: Multidetector CT imaging of the chest was performed using the standard protocol during bolus administration of intravenous contrast. Multiplanar CT image reconstructions and MIPs were obtained to evaluate the vascular anatomy. CONTRAST:  100 mL of Isovue 370 COMPARISON:  Chest x-ray from earlier today FINDINGS: Cardiovascular: A left coronary stent is identified. Minimal atherosclerotic change in the thoracic aorta with no aneurysm or dissection. No pulmonary  emboli identified. The heart size is normal. Mediastinum/Nodes: No enlarged mediastinal, hilar, or axillary lymph nodes. Thyroid gland, trachea, and esophagus demonstrate no significant findings. Lungs/Pleura: The central airways are within normal limits. No pneumothorax. Mild emphysematous changes in the apices. Mild dependent atelectasis. No evidence of pneumonia. No mass or suspicious nodule identified. Upper Abdomen: No acute abnormality. Musculoskeletal: No chest wall abnormality. No acute or significant osseous findings. Review of the MIP images confirms the above findings. IMPRESSION: 1. No pulmonary emboli or acute abnormality. Electronically Signed   By: Gerome Sam III M.D   On: 11/26/2016 19:04        Scheduled Meds: . aspirin  81 mg Oral Daily  . atorvastatin  80 mg Oral q1800  . buPROPion  150 mg Oral Daily  . carvedilol  3.125 mg Oral BID WC  . [START ON 11/29/2016] clopidogrel  75 mg Oral Daily  . FLUoxetine  40 mg Oral Daily  . heparin  5,000 Units Subcutaneous Q8H  . lisinopril  2.5 mg Oral Daily  . magnesium oxide  400 mg Oral BID  . morphine  15 mg Oral Q12H  . potassium chloride  20 mEq Oral Daily  . senna-docusate  2 tablet Oral QHS  . sodium chloride flush  3 mL Intravenous Q12H  . tiZANidine  2 mg Oral QHS   Continuous Infusions:    LOS: 1 day    Time spent: 25 min    JESSICA U VANN, DO [facility] Pager [phone]  If 7PM-7AM, please contact night-coverage [email or web address] Password [email or web address] 11/28/2016, 11:29 AM

## 2016-11-28 NOTE — Care Management Note (Addendum)
Case Management Note  Patient Details  Name: Crystal Roberson MRN: [id number] Date of Birth: [birth year]  Subjective/Objective:                 Patient from home with boyfriend. Patient had difficulty affording Brilinta prior to admission. However this has been changed to Plavix.   [facility]   Action/Plan:  CM reviewed GoodRx app with patient. Described use, patient states she has ability to use at home and intends to. Offered to print out coupon for plavix, she declined and stated that she would rather download and use app. Anticipate DC today   Expected Discharge Date:  11/28/16               Expected Discharge Plan:  Home/Self Care  In-House Referral:     Discharge planning Services  CM Consult  Post Acute Care Choice:    Choice offered to:     DME Arranged:    DME Agency:     HH Arranged:    HH Agency:     Status of Service:  In process, will continue to follow  If discussed at Long Length of Stay Meetings, dates discussed:    Additional Comments:  Lawerance SabalDebbie Addis Tuohy, RN 11/28/2016, 12:52 PM

## 2016-11-29 DIAGNOSIS — I10 Essential (primary) hypertension: Secondary | ICD-10-CM

## 2016-11-29 DIAGNOSIS — E876 Hypokalemia: Secondary | ICD-10-CM

## 2016-11-29 LAB — CBC
HEMATOCRIT: 35.5 % — AB (ref 36.0–46.0)
Hemoglobin: 11.3 g/dL — ABNORMAL LOW (ref 12.0–15.0)
MCH: 29.8 pg (ref 26.0–34.0)
MCHC: 31.8 g/dL (ref 30.0–36.0)
MCV: 93.7 fL (ref 78.0–100.0)
Platelets: 177 10*3/uL (ref 150–400)
RBC: 3.79 MIL/uL — AB (ref 3.87–5.11)
RDW: 12.4 % (ref 11.5–15.5)
WBC: 5.6 10*3/uL (ref 4.0–10.5)

## 2016-11-29 LAB — BASIC METABOLIC PANEL
ANION GAP: 7 (ref 5–15)
BUN: 9 mg/dL (ref 6–20)
CHLORIDE: 107 mmol/L (ref 101–111)
CO2: 24 mmol/L (ref 22–32)
Calcium: 9.1 mg/dL (ref 8.9–10.3)
Creatinine, Ser: 0.77 mg/dL (ref 0.44–1.00)
GFR calc Af Amer: >60 mL/min (ref >60)
GFR calc non Af Amer: >60 mL/min (ref >60)
GLUCOSE: 95 mg/dL (ref 65–99)
POTASSIUM: 4 mmol/L (ref 3.5–5.1)
Sodium: 138 mmol/L (ref 135–145)

## 2016-11-29 MED ORDER — CARVEDILOL 3.125 MG PO TABS: 3.1250 mg | ORAL_TABLET | Freq: Two times a day (BID) | ORAL | 0 refills | Status: DC

## 2016-11-29 MED ORDER — LISINOPRIL 2.5 MG PO TABS: 2.5000 mg | ORAL_TABLET | Freq: Every day | ORAL | 0 refills | Status: DC

## 2016-11-29 MED ORDER — POTASSIUM CHLORIDE CRYS ER 20 MEQ PO TBCR: 20.0000 meq | EXTENDED_RELEASE_TABLET | Freq: Every day | ORAL | 0 refills | Status: DC

## 2016-11-29 MED ORDER — CLOPIDOGREL BISULFATE 75 MG PO TABS: 75.0000 mg | ORAL_TABLET | Freq: Every day | ORAL | 0 refills | Status: DC

## 2016-11-29 MED ORDER — MAGNESIUM OXIDE 400 (241.3 MG) MG PO TABS: 400.0000 mg | ORAL_TABLET | Freq: Two times a day (BID) | ORAL | 0 refills | Status: DC

## 2016-11-29 MED ORDER — FUROSEMIDE 20 MG PO TABS: ORAL_TABLET | ORAL | 0 refills | Status: DC

## 2016-11-29 NOTE — Discharge Summary (Addendum)
Physician Discharge Summary  Crystal Roberson ZOX:[id number] DOB: 05-Apr-1966 DOA: 11/26/2016  PCP: Tommie SamsJayce G Cook, DO  Admit date: 11/26/2016 Discharge date: 11/29/2016   Recommendations for Outpatient Follow-Up:   1. Lasix PRN 2. Close outpatient cardiology follow up 3. BMP 1 week re K   Discharge Diagnosis:   Principal Problem:   Postural dizziness with near syncope Active Problems:   DDD (degenerative disc disease), lumbosacral   Essential hypertension   Depression   Anxiety   Hypotension   STEMI involving left anterior descending coronary artery (HCC)   Chronic combined systolic and diastolic CHF, NYHA class 2 (HCC)   Chest pain   Discharge disposition:  Home  Discharge Condition: Improved.  Diet recommendation: Low sodium, heart healthy.   Wound care: None.   History of Present Illness:   Crystal Roberson is a 51 y.o. female with medical history significant of ischemic cardiomyopathy with ejection fraction of 30%, hypertension, chronic back pain comes to the ED with complaints of near syncope this afternoon. Patient states she had been feeling short of breath for past 3 or 4 days which progressively worsened to a point where she could not even lay flat therefore she ended up calling her PCP yesterday who prescribed her Lasix and potassium. She tookher Lasix and potassium which made her urinate several times and felt better. She was able to sleep last night but then this morning she felt weak and early in the afternoon while she was there and when he sat down in a recliner she became lightheaded and almost passed out on her chair. She checked her blood pressure at the time which was 60s over 30s therefore ended up calling her PCP Crystal Roberson come to the ER for evaluation. No other complaints  Patient was admitted here at the end of December 2017 with STEMI showing 99% mid LAD stenosis status post DES. She was started on dual antiplatelet therapy and was plan to maximize her medical  therapy. 2 weeks ago her lisinopril dose was increased to 5 mg which she seemed to have tolerated well and her Coreg dose was increased to 6.25 mg earlier this past week. She has been following with Dr. Okey DupreEnd from cardiology and has a follow-up appointment this week again for possible evaluation of LifeVest.   Hospital Course by Problem:   SOB -started after cath -brilinta changed to plavix -improved today  Postural dizziness with near syncope-improved with change of meds/hydration -Likely secondary to dehydration from Lasix  -CTA of the chest done which was negative for pulmonary embolism in the ED -No need for echo, had one done on 11/06/2016.  Hypo-kalemia -Likely secondary to Lasix use -Repleted  Chronic combined systolic and diastolic cardiomyopathy with ejection fraction of 30% secondary to recent MI -Recent echocardiogram shows ejection fraction of 30-35% -resume ACE/coreg at lower dose  Essential hypertension -resume home meds  Anxiety/depression -Continue home medicine  Degenerative disc disease -Pain control    Medical Consultants:    cards   Discharge Exam:   Vitals:   11/29/16 0350 11/29/16 0926  BP: (!) 93/54 106/68  Pulse: (!) 58 63  Resp: 18   Temp: 98.2 F (36.8 C)    Vitals:   11/28/16 1400 11/28/16 2211 11/29/16 0350 11/29/16 0926  BP: 133/79 126/68 (!) 93/54 106/68  Pulse: 70 64 (!) 58 63  Resp: 18 18 18    Temp: 98.2 F (36.8 C) 98.1 F (36.7 C) 98.2 F (36.8 C)   TempSrc: Oral Oral Oral  SpO2: 99% 95% 98%   Weight:      Height:        Gen:  NAD    The results of significant diagnostics from this hospitalization (including imaging, microbiology, ancillary and laboratory) are listed below for reference.     Procedures and Diagnostic Studies:   Dg Chest 2 View  Result Date: 11/26/2016 CLINICAL DATA:  Shortness of breath. EXAM: CHEST  2 VIEW COMPARISON:  September 23, 2016 FINDINGS: The heart size and mediastinal contours  are within normal limits. Both lungs are clear. The visualized skeletal structures are unremarkable. IMPRESSION: No active cardiopulmonary disease. Electronically Signed   By: Gerome Sam III M.D   On: 11/26/2016 16:47   Ct Angio Chest Pe W And/or Wo Contrast  Result Date: 11/26/2016 CLINICAL DATA:  Shortness of breath.  Syncope. EXAM: CT ANGIOGRAPHY CHEST WITH CONTRAST TECHNIQUE: Multidetector CT imaging of the chest was performed using the standard protocol during bolus administration of intravenous contrast. Multiplanar CT image reconstructions and MIPs were obtained to evaluate the vascular anatomy. CONTRAST:  100 mL of Isovue 370 COMPARISON:  Chest x-ray from earlier today FINDINGS: Cardiovascular: A left coronary stent is identified. Minimal atherosclerotic change in the thoracic aorta with no aneurysm or dissection. No pulmonary emboli identified. The heart size is normal. Mediastinum/Nodes: No enlarged mediastinal, hilar, or axillary lymph nodes. Thyroid gland, trachea, and esophagus demonstrate no significant findings. Lungs/Pleura: The central airways are within normal limits. No pneumothorax. Mild emphysematous changes in the apices. Mild dependent atelectasis. No evidence of pneumonia. No mass or suspicious nodule identified. Upper Abdomen: No acute abnormality. Musculoskeletal: No chest wall abnormality. No acute or significant osseous findings. Review of the MIP images confirms the above findings. IMPRESSION: 1. No pulmonary emboli or acute abnormality. Electronically Signed   By: Gerome Sam III M.D   On: 11/26/2016 19:04     Labs:   Basic Metabolic Panel:  Recent Labs Lab 11/26/16 1630 11/26/16 2231 11/27/16 0402 11/29/16 0351  NA 137  --  141 138  K 2.9*  --  3.6 4.0  CL 101  --  111 107  CO2 25  --  26 24  GLUCOSE 99  --  93 95  BUN 7  --  7 9  CREATININE 1.11* 1.01* 0.93 0.77  CALCIUM 9.1  --  8.2* 9.1  MG  --   --  1.7  --    GFR Estimated Creatinine Clearance:  66.5 mL/min (by C-G formula based on SCr of 0.77 mg/dL). Liver Function Tests:  Recent Labs Lab 11/26/16 1630  AST 21  ALT 36  ALKPHOS 242*  BILITOT 0.8  PROT 6.5  ALBUMIN 3.5    Recent Labs Lab 11/26/16 1630  LIPASE 15   No results for input(s): AMMONIA in the last 168 hours. Coagulation profile No results for input(s): INR, PROTIME in the last 168 hours.  CBC:  Recent Labs Lab 11/26/16 1630 11/26/16 2231 11/29/16 0351  WBC 7.7 8.6 5.6  NEUTROABS 4.6  --   --   HGB 12.4 12.4 11.3*  HCT 37.0 37.8 35.5*  MCV 90.7 92.2 93.7  PLT 194 199 177   Cardiac Enzymes:  Recent Labs Lab 11/26/16 2231 11/27/16 0402 11/27/16 1014  TROPONINI 0.03* <0.03 <0.03   BNP: Invalid input(s): POCBNP CBG: No results for input(s): GLUCAP in the last 168 hours. D-Dimer No results for input(s): DDIMER in the last 72 hours. Hgb A1c No results for input(s): HGBA1C in the last 72  hours. Lipid Profile No results for input(s): CHOL, HDL, LDLCALC, TRIG, CHOLHDL, LDLDIRECT in the last 72 hours. Thyroid function studies No results for input(s): TSH, T4TOTAL, T3FREE, THYROIDAB in the last 72 hours.  Invalid input(s): FREET3 Anemia work up No results for input(s): VITAMINB12, FOLATE, FERRITIN, TIBC, IRON, RETICCTPCT in the last 72 hours. Microbiology No results found for this or any previous visit (from the past 240 hour(s)).   Discharge Instructions:   Discharge Instructions    (HEART FAILURE PATIENTS) Call MD:  Anytime you have any of the following symptoms: 1) 3 pound weight gain in 24 hours or 5 pounds in 1 week 2) shortness of breath, with or without a dry hacking cough 3) swelling in the hands, feet or stomach 4) if you have to sleep on extra pillows at night in order to breathe.    Complete by:  As directed    Diet - low sodium heart healthy    Complete by:  As directed    Diet Carb Modified    Complete by:  As directed    Discharge instructions    Complete by:  As  directed    Take lasix PRN with a 2-3 lb weight gain in one day, with supplemental K   Increase activity slowly    Complete by:  As directed      Allergies as of 11/29/2016      Reactions   Pregabalin Shortness Of Breath, Swelling, Palpitations, Other (See Comments)   Other reaction(s): Tachycardia (finding)   Codeine Nausea And Vomiting      Medication List    STOP taking these medications   potassium chloride 10 MEQ tablet Commonly known as:  K-DUR Replaced by:  potassium chloride SA 20 MEQ tablet   ticagrelor 90 MG Tabs tablet Commonly known as:  BRILINTA     TAKE these medications   aspirin 81 MG chewable tablet Chew 1 tablet (81 mg total) by mouth daily.   atorvastatin 80 MG tablet Commonly known as:  LIPITOR Take 1 tablet (80 mg total) by mouth daily at 6 PM.   buPROPion 150 MG 24 hr tablet Commonly known as:  WELLBUTRIN XL TAKE 1 TABLET BY MOUTH ONCE DAILY   carvedilol 3.125 MG tablet Commonly known as:  COREG Take 1 tablet (3.125 mg total) by mouth 2 (two) times daily with a meal. What changed:  medication strength  how much to take  when to take this   clopidogrel 75 MG tablet Commonly known as:  PLAVIX Take 1 tablet (75 mg total) by mouth daily. Start taking on:  11/30/2016   FLUoxetine 20 MG capsule Commonly known as:  PROZAC Take 2 capsules (40 mg total) by mouth daily.   furosemide 20 MG tablet Commonly known as:  LASIX 20 mg daily PRN with a 2-3 lb weight gain in one day, with supplemental K What changed:  medication strength  how much to take  how to take this  when to take this  additional instructions   lidocaine 5 % Commonly known as:  LIDODERM Place 1 patch onto the skin daily. Apply 1-3 patches to skin  for 12 hours then Remove patches for 12 hours. Repeat process if tolerated What changed:  when to take this  reasons to take this  additional instructions   lisinopril 2.5 MG tablet Commonly known as:   PRINIVIL,ZESTRIL Take 1 tablet (2.5 mg total) by mouth daily. Start taking on:  11/30/2016 What changed:  how much to take  magnesium oxide 400 (241.3 Mg) MG tablet Commonly known as:  MAG-OX Take 1 tablet (400 mg total) by mouth 2 (two) times daily.   morphine 15 MG 12 hr tablet Commonly known as:  MS CONTIN Take 15 mg by mouth every 12 (twelve) hours.   nitroGLYCERIN 0.4 MG SL tablet Commonly known as:  NITROSTAT Place 1 tablet (0.4 mg total) under the tongue every 5 (five) minutes as needed for chest pain (CP or SOB).   potassium chloride SA 20 MEQ tablet Commonly known as:  K-DUR,KLOR-CON Take 1 tablet (20 mEq total) by mouth daily. If takes lasix, take extra dose Start taking on:  11/30/2016 Replaces:  potassium chloride 10 MEQ tablet   tiZANidine 2 MG tablet Commonly known as:  ZANAFLEX Limit 1 tablet by mouth per day or 2 tablets by mouth per day if tolerated   zolpidem 10 MG tablet Commonly known as:  AMBIEN Take 1 tablet (10 mg total) by mouth at bedtime as needed for sleep.      Follow-up Information    Tommie Sams, DO Follow up in 1 week(s).   Specialty:  Family Medicine Contact information: [address] [place] [place] [address] [phone]            Time coordinating discharge: 35 min  Signed:  Aubriee Szeto Juanetta Gosling   [facility] 11/29/2016, 11:18 AM

## 2016-11-29 NOTE — Progress Notes (Signed)
Progress Note  Patient Name: Crystal Roberson Date of Encounter: 11/29/2016  Primary Cardiologist: Dr. Okey DupreEnd   Subjective   Breathing is a little bit improved after stopping Brilinta. No chest pain. She felt a little dizzy earlier when she got up to move around her room but no syncope/ near syncope. Nothing like what she felt at home prior to admission.  Inpatient Medications    Scheduled Meds: . aspirin  81 mg Oral Daily  . atorvastatin  80 mg Oral q1800  . buPROPion  150 mg Oral Daily  . carvedilol  3.125 mg Oral BID WC  . clopidogrel  75 mg Oral Daily  . FLUoxetine  40 mg Oral Daily  . heparin  5,000 Units Subcutaneous Q8H  . lisinopril  2.5 mg Oral Daily  . magnesium oxide  400 mg Oral BID  . morphine  15 mg Oral Q12H  . potassium chloride  20 mEq Oral Daily  . senna-docusate  2 tablet Oral QHS  . sodium chloride flush  3 mL Intravenous Q12H  . tiZANidine  2 mg Oral QHS   Continuous Infusions:  PRN Meds: nitroGLYCERIN, polyethylene glycol, zolpidem   Vital Signs    Vitals:   11/28/16 1140 11/28/16 1400 11/28/16 2211 11/29/16 0350  BP: 122/70 133/79 126/68 (!) 93/54  Pulse:  70 64 (!) 58  Resp:  18 18 18   Temp:  98.2 F (36.8 C) 98.1 F (36.7 C) 98.2 F (36.8 C)  TempSrc:  Oral Oral Oral  SpO2:  99% 95% 98%  Weight:      Height:        Intake/Output Summary (Last 24 hours) at 11/29/16 0906 Last data filed at 11/28/16 1700  Gross per 24 hour  Intake              720 ml  Output                0 ml  Net              720 ml   Filed Weights   11/26/16 2232  Weight: 134 lb 14.4 oz (61.2 kg)    Telemetry    NSR - Personally Reviewed    Physical Exam   GEN: No acute distress.   Neck: No JVD Cardiac: RRR, no murmurs, rubs, or gallops.  Respiratory: Clear to auscultation bilaterally. GI: Soft, nontender, non-distended  MS: No edema; No deformity. Neuro:  Nonfocal  Psych: Normal affect   Labs    Chemistry Recent Labs Lab 11/26/16 1630  11/26/16 2231 11/27/16 0402 11/29/16 0351  NA 137  --  141 138  K 2.9*  --  3.6 4.0  CL 101  --  111 107  CO2 25  --  26 24  GLUCOSE 99  --  93 95  BUN 7  --  7 9  CREATININE 1.11* 1.01* 0.93 0.77  CALCIUM 9.1  --  8.2* 9.1  PROT 6.5  --   --   --   ALBUMIN 3.5  --   --   --   AST 21  --   --   --   ALT 36  --   --   --   ALKPHOS 242*  --   --   --   BILITOT 0.8  --   --   --   GFRNONAA 56* >60 >60 >60  GFRAA >60 >60 >60 >60  ANIONGAP 11  --  4* 7  Hematology Recent Labs Lab 11/26/16 1630 11/26/16 2231 11/29/16 0351  WBC 7.7 8.6 5.6  RBC 4.08 4.10 3.79*  HGB 12.4 12.4 11.3*  HCT 37.0 37.8 35.5*  MCV 90.7 92.2 93.7  MCH 30.4 30.2 29.8  MCHC 33.5 32.8 31.8  RDW 12.5 12.5 12.4  PLT 194 199 177    Cardiac Enzymes Recent Labs Lab 11/26/16 2231 11/27/16 0402 11/27/16 1014  TROPONINI 0.03* <0.03 <0.03    Recent Labs Lab 11/26/16 1632  TROPIPOC 0.02     BNP Recent Labs Lab 11/26/16 1630  BNP 160.4*     DDimer No results for input(s): DDIMER in the last 168 hours.   Radiology    No results found.  Cardiac Studies   Orthostatic VS for the past 24 hrs (Last 3 readings):  BP- Lying Pulse- Lying BP- Sitting Pulse- Sitting BP- Standing at 0 minutes Pulse- Standing at 0 minutes BP- Standing at 3 minutes Pulse- Standing at 3 minutes  11/28/16 1400 137/67 63 129/72 69 144/68 72 150/74 77     Patient Profile     51 y.o. female with a PMH significant for ischemic cardiomyopathy with EF 30-35% secondary to anterior STEMI (08/2016) s/p DES to LAD, HTN,and anxiety/depression. She is known to our service and was last seen in clinic on 09/26/16. She called the on-call provider over the weekend (11/25/16 - 11/26/16) for help managing her worsening SOB and orthopnea. She was prescribed lasix which seemed to improve her breathing; however, she had a possible syncopal episode at home on 11/26/16 and she was asked to come to the ED. Cardiology was consulted to further  manage her possible syncope.  Assessment & Plan    1. Near syncope vs syncope (no further recurrence since admission) - pt states she "blacked out" after standing up yesterday at home, but is unsure if she lost consciousness. She denies hitting her head and states she fell back into a recliner and yelled for help - this episode may be due to orthostatic hypotension given her recent increase in lisinopril and coreg and the addition of lasix for worsening SOB and orthopnea - Orthostatics performed here have been normal - she is also on chronic opioid therapy (MS contin), which can also contribute to hypotension - troponin x 3 negative - low dose lisinopril and Coreg restarted and she is tolerating well, lasix PRN - pt denies further significant dizziness, lightheadedness, or pre-syncopal episodes   2. Chronic Systolic heart failure/ischemic cardiomyopathy - LVEF 30-35%; BNP on arrival was 160 - home meds include ASA, Brilinta, lipitor, lisinopril 5 mg daily, coreg 6.25 mg BID - orthostatics performed, no significant drop in BP with standing. Repeating.  - f/u orthostatics post resumption of low dose ACE + BB normal - continue Lasix 2.5 mg and coreg 3.125 mg BID for now. Gradually increase doses as BP allows. - Lasix remains on hold. - eventually, will need to add 20 mg lasix PRN with a 2-3 lb weight gain in one day, with supplemental K, depending on fluid status   3. Shortness of breath - Improved some since stopping Brilinta - CXR yesterday and exam not consistent with pleural effusion or edema; she appears near euvolemic - add a low dose of lasix PRN - could be secondary to mitral regurgitation, which is likely due to her low EF - she is not anemic, CTA without PE, CXR without effusion - SOB may be related to a side effect of Brilinta. This has been discontinued and she has  been transitioned to Plavix.            - pt states she continues to have SOB, but satting 99-100% on room  air - Can consider RHC to further evaluate in the future. ? If also related to her MR  4. CAD:  - s/p anterior STEMI (08/2016) s/p DES to LAD - stable w/o angina - continue medical therapy: ASA, Plavix, metoprolol, Lipitor and lisinopril   Signed, Robbie Lis, PA-C  11/29/2016, 9:06 AM    Personally seen and examined. Agree with above.  51 year old with anterior myocardial infarction, STEMI in December 2017 with EF of 30-35%, persistent with general hypotension, unable to further increase her beta blocker and ACE inhibitor admitted with possible near syncope, shortness of breath.  Overall she has improved since discontinuing Brilinta and starting Plavix. She is tolerating Plavix well. Perhaps there was some component of shortness of breath triggered by Brilinta pharmicotherapy. She also has other reasons for shortness of breath, i.e. her chronic systolic heart failure, ischemic cardiomyopathy. Her persistent hypotension also may be suggestive of reduced cardiac output. Nonetheless, we have restarted her low-dose carvedilol and low-dose lisinopril as above. I agree with using Lasix on a when necessary basis if weight increases 2 pounds. She seems to be ready to go home. We will have close follow-up in clinic. If symptoms continue to deteriorate, we will have low threshold for right heart catheterization and possible referral to advanced heart failure clinic. We have not discussed ICD as of yet but she has been revascularized post MI and continues to have reduced ejection fraction less than 35%.  Will set up close follow up.   Donato Schultz, MD

## 2016-11-30 ENCOUNTER — Ambulatory Visit: Payer: Self-pay | Admitting: Internal Medicine

## 2016-12-12 ENCOUNTER — Telehealth: Payer: Self-pay | Admitting: Family Medicine

## 2016-12-12 ENCOUNTER — Ambulatory Visit (INDEPENDENT_AMBULATORY_CARE_PROVIDER_SITE_OTHER): Payer: Self-pay | Admitting: Family Medicine

## 2016-12-12 ENCOUNTER — Encounter: Payer: Self-pay | Admitting: Family Medicine

## 2016-12-12 VITALS — BP 158/98 | HR 85 | Temp 98.0°F | Wt 133.1 lb

## 2016-12-12 DIAGNOSIS — N3001 Acute cystitis with hematuria: Secondary | ICD-10-CM

## 2016-12-12 DIAGNOSIS — R300 Dysuria: Secondary | ICD-10-CM

## 2016-12-12 LAB — POCT URINALYSIS DIPSTICK
BILIRUBIN UA: NEGATIVE
GLUCOSE UA: NEGATIVE
KETONES UA: NEGATIVE
Nitrite, UA: NEGATIVE
SPEC GRAV UA: 1.03 (ref 1.030–1.035)
pH, UA: 6 (ref 5.0–8.0)

## 2016-12-12 MED ORDER — PHENAZOPYRIDINE HCL 200 MG PO TABS: 200.0000 mg | ORAL_TABLET | Freq: Three times a day (TID) | ORAL | 0 refills | Status: DC | PRN

## 2016-12-12 MED ORDER — NITROFURANTOIN MONOHYD MACRO 100 MG PO CAPS: 100.0000 mg | ORAL_CAPSULE | Freq: Two times a day (BID) | ORAL | 0 refills | Status: DC

## 2016-12-12 NOTE — Telephone Encounter (Signed)
Appointment today 400 pm Dr Adriana [place]asook.

## 2016-12-12 NOTE — Assessment & Plan Note (Addendum)
New acute problem. History and urinalysis consistent with UTI. Treating with Macrobid and Pyridium. Sending culture.

## 2016-12-12 NOTE — Telephone Encounter (Signed)
Pt called about having a possible kidney infection. Pt states she sees blood in her urine and is having pain. Pt was given an appt. Pt was transferred to the nurse line. Thank you!

## 2016-12-12 NOTE — Progress Notes (Signed)
Pre visit review using our clinic review tool, if applicable. No additional management support is needed unless otherwise documented below in the visit note. 

## 2016-12-12 NOTE — Telephone Encounter (Signed)
fyi

## 2016-12-12 NOTE — Patient Instructions (Signed)
Antibiotic as prescribed.  Take care  Dr. Andrea Colglazier  

## 2016-12-12 NOTE — Progress Notes (Signed)
Subjective:  Patient ID: Crystal Roberson, female    DOB: [birth year]  Age: 51 y.o. MRN: [id number]  CC: UTI  HPI:  51 year old female presents with concerns for UTI.  Reports that she developed frequency and urgency and some associated suprapubic pain on Sunday. Symptoms are severe. She has noticed some blood with wiping. No fever. No nausea or vomiting. No chills. No flank pain. No known exacerbating or relieving factors. No other associated symptoms. No other complaints or concerns at this time.  Social Hx   Social History   Social History  . Marital status: Divorced    Spouse name: N/A  . Number of children: N/A  . Years of education: N/A   Social History Main Topics  . Smoking status: Former Smoker    Packs/day: 0.75    Years: 30.00    Types: Cigarettes    Quit date: 09/23/2016  . Smokeless tobacco: Never Used  . Alcohol use No  . Drug use: No  . Sexual activity: Not Currently   Other Topics Concern  . None   Social History Narrative  . None    Review of Systems  Constitutional: Negative.   Genitourinary: Positive for frequency, hematuria and urgency. Negative for flank pain.   Objective:  BP (!) 158/98   Pulse 85   Temp 98 F (36.7 C) (Oral)   Wt 133 lb 2 oz (60.4 kg)   SpO2 95%   BMI 26.00 kg/m   BP/Weight 12/12/2016 11/29/2016 11/26/2016  Systolic BP 158 106 -  Diastolic BP 98 68 -  Wt. (Lbs) 133.13 - 134.9  BMI 26 - 26.35   Physical Exam  Constitutional: She is oriented to person, place, and time. She appears well-developed. No distress.  Cardiovascular: Normal rate and regular rhythm.   Pulmonary/Chest: Effort normal and breath sounds normal.  Abdominal: Soft.  Suprapubic tenderness to palpation.  Neurological: She is alert and oriented to person, place, and time.  Vitals reviewed.  Results for orders placed or performed in visit on 12/12/16 (from the past 24 hour(s))  POCT Urinalysis Dipstick     Status: Abnormal   Collection Time: 12/12/16   4:06 PM  Result Value Ref Range   Color, UA light brown    Clarity, UA cloudy    Glucose, UA neg    Bilirubin, UA neg    Ketones, UA neg    Spec Grav, UA 1.030 1.030 - 1.035   Blood, UA 3+    pH, UA 6.0 5.0 - 8.0   Protein, UA 2+    Urobilinogen, UA  Negative - 2.0   Nitrite, UA neg    Leukocytes, UA moderate (2+) (A) Negative   Assessment & Plan:   Problem List Items Addressed This Visit    Acute cystitis with hematuria - Primary    New acute problem. History and urinalysis consistent with UTI. Treating with Macrobid and Pyridium. Sending culture.      Relevant Orders   POCT Urinalysis Dipstick (Completed)   Urine Culture      Meds ordered this encounter  Medications  . nitrofurantoin, macrocrystal-monohydrate, (MACROBID) 100 MG capsule    Sig: Take 1 capsule (100 mg total) by mouth 2 (two) times daily.    Dispense:  14 capsule    Refill:  0  . phenazopyridine (PYRIDIUM) 200 MG tablet    Sig: Take 1 tablet (200 mg total) by mouth 3 (three) times daily as needed for pain.    Dispense:  10 tablet  Refill:  0    Follow-up: PRN  Hasty

## 2016-12-12 NOTE — Telephone Encounter (Signed)
Your 4 p.m. Today.

## 2016-12-12 NOTE — Telephone Encounter (Signed)
[facility] - [place] TELEPHONE ADVICE RECORD [facility]  Patient Name: Crystal Roberson  DOB: 1966-08-14    Initial Comment Caller states she thinks she may have a kidney infection, pain when she urinates, and blood in the urine.   Nurse Assessment  Nurse: Laural BenesJohnson, RN, Dondra SpryGail Date/Time Lamount Cohen(Eastern Time): 12/12/2016 9:34:03 AM  Confirm and document reason for call. If symptomatic, describe symptoms. ---[name] started having pain with urination and blood in urine onset Sunday and got worse yesterday urgency and frequency present  Does the patient have any new or worsening symptoms? ---Yes  Will a triage be completed? ---Yes  Related visit to physician within the last 2 weeks? ---No  Does the PT have any chronic conditions? (i.e. diabetes, asthma, etc.) ---No  Is the patient pregnant or possibly pregnant? (Ask all females between the ages of 9512-55) ---No  Is this a behavioral health or substance abuse call? ---No     Guidelines    Guideline Title Affirmed Question Affirmed Notes  Urination Pain - Female [1] SEVERE pain with urination (e.g., excruciating) AND [2] not improved after 2 hours of pain medicine and Sitz bath    Final Disposition User   See Physician within 4 Hours (or PCP triage) Laural BenesJohnson, RN, Dondra SpryGail    Comments  NOTE [name] has scheduled an appt for today at 4pm Nurse did not schedule her one.   Referrals  REFERRED TO PCP OFFICE   Disagree/Comply: Comply

## 2016-12-13 LAB — URINE CULTURE

## 2016-12-14 ENCOUNTER — Telehealth: Payer: Self-pay | Admitting: Family Medicine

## 2016-12-14 NOTE — Telephone Encounter (Signed)
Pt advised of labs.

## 2016-12-14 NOTE — Telephone Encounter (Signed)
Pt called back returning your call. Thank you!  Call pt @ [phone]

## 2017-01-04 ENCOUNTER — Other Ambulatory Visit: Payer: Self-pay | Admitting: *Deleted

## 2017-01-04 MED ORDER — CLOPIDOGREL BISULFATE 75 MG PO TABS: 75.0000 mg | ORAL_TABLET | Freq: Every day | ORAL | 6 refills | Status: DC

## 2017-01-04 MED ORDER — CLOPIDOGREL BISULFATE 75 MG PO TABS: 75.0000 mg | ORAL_TABLET | Freq: Every day | ORAL | 11 refills | Status: DC

## 2017-01-04 MED ORDER — MAGNESIUM OXIDE 400 (241.3 MG) MG PO TABS: 400.0000 mg | ORAL_TABLET | Freq: Two times a day (BID) | ORAL | 6 refills | Status: DC

## 2017-01-04 MED ORDER — POTASSIUM CHLORIDE CRYS ER 20 MEQ PO TBCR: 20.0000 meq | EXTENDED_RELEASE_TABLET | Freq: Every day | ORAL | 11 refills | Status: DC

## 2017-01-04 MED ORDER — MAGNESIUM OXIDE 400 (241.3 MG) MG PO TABS
400.0000 mg | ORAL_TABLET | Freq: Two times a day (BID) | ORAL | 11 refills | Status: DC
Start: 2017-01-04 — End: 2017-01-04

## 2017-01-04 NOTE — Addendum Note (Signed)
Addended by: Jacqlyn Krauss on: 01/04/2017 02:58 PM   Modules accepted: Orders

## 2017-01-04 NOTE — Telephone Encounter (Signed)
It is fine to refill these prescriptions and follow-up as planned on 4/20.

## 2017-01-04 NOTE — Telephone Encounter (Signed)
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 Brilinta was changed to Plavix because of shortness of breath. She is taking KCL 20 mEq daily, she said her PCP told her to do this sometime before hospitalization in March 2018,  she takes extra KCL if she takes lasix 20 mg for 2-3 pound weight gain in 24 hours. She said she had not had to take lasix since she was discharged from the hospital in March 2018. She said she has not had lab since March 2018 hospitalization.  I have scheduled her to see you 01/12/17. She is requesting refills for Plavix  daily, KCL 20 mEq daily, and Magnesium Oxide 400 mg bid.  Is it okay for me to refill these?

## 2017-01-04 NOTE — Telephone Encounter (Signed)
Discharge Summaries Date of Service: 11/29/2016 11:18 AM Joseph Art, DO  Internal Medicine  Expand All Collapse All   [facility] copied text   Physician Discharge Summary  Crystal Roberson:[id number] DOB: 06-21-1966 DOA: 11/26/2016  PCP: Tommie Sams, DO  Admit date: 11/26/2016 Discharge date: 11/29/2016      History of Present Illness:   Crystal D Bowersis a 51 y.o.femalewith medical history significant ofischemic cardiomyopathy with ejection fraction of 30%, hypertension, chronic back pain comes to the ED with complaints of near syncope this afternoon. Patient states she had been feeling short of breath for past 3 or 4 days which progressively worsened to a point where she could not even lay flat therefore she ended up calling her PCP yesterday who prescribed her Lasix and potassium. She tookher Lasix and potassium which made her urinate several times and felt better. She was able to sleep last night but then this morning she felt weak and early in the afternoon while she was there and when he sat down in a recliner she became lightheaded and almost passed out on her chair. She checked her blood pressure at the time which was 60s over 30s therefore ended up calling her PCP Crystal Roberson come to the ER for evaluation. No other complaints  Patient was admitted here at the end of December 2017 with STEMI showing 99% mid LAD stenosis status post DES. She was started on dual antiplatelet therapy and was plan to maximize her medical therapy. 2 weeks ago her lisinopril dose was increased to 5 mg which she seemed to have tolerated well and her Coreg dose was increased to 6.25 mg earlier this past week. She has been following with Dr. Okey Dupre from cardiology and has a follow-up appointment this weekagain for possible evaluation of LifeVest.   Hospital Course by Problem:   SOB -started after cath -brilinta changed to plavix -improved today

## 2017-01-04 NOTE — Telephone Encounter (Signed)
Pt states she does not need KCL at this time

## 2017-01-04 NOTE — Telephone Encounter (Signed)
Opened in error

## 2017-01-12 ENCOUNTER — Ambulatory Visit: Payer: Self-pay | Admitting: Internal Medicine

## 2017-01-12 ENCOUNTER — Ambulatory Visit (INDEPENDENT_AMBULATORY_CARE_PROVIDER_SITE_OTHER): Payer: Self-pay | Admitting: Internal Medicine

## 2017-01-12 ENCOUNTER — Telehealth: Payer: Self-pay | Admitting: Internal Medicine

## 2017-01-12 ENCOUNTER — Encounter (INDEPENDENT_AMBULATORY_CARE_PROVIDER_SITE_OTHER): Payer: Self-pay

## 2017-01-12 ENCOUNTER — Encounter: Payer: Self-pay | Admitting: Internal Medicine

## 2017-01-12 VITALS — BP 130/82 | HR 76 | Ht 60.0 in | Wt 136.8 lb

## 2017-01-12 DIAGNOSIS — I2510 Atherosclerotic heart disease of native coronary artery without angina pectoris: Secondary | ICD-10-CM

## 2017-01-12 DIAGNOSIS — R42 Dizziness and giddiness: Secondary | ICD-10-CM

## 2017-01-12 DIAGNOSIS — I5042 Chronic combined systolic (congestive) and diastolic (congestive) heart failure: Secondary | ICD-10-CM

## 2017-01-12 DIAGNOSIS — I10 Essential (primary) hypertension: Secondary | ICD-10-CM

## 2017-01-12 DIAGNOSIS — I252 Old myocardial infarction: Secondary | ICD-10-CM

## 2017-01-12 DIAGNOSIS — E785 Hyperlipidemia, unspecified: Secondary | ICD-10-CM

## 2017-01-12 NOTE — Patient Instructions (Signed)
Medication Instructions:  Your physician recommends that you continue on your current medications as directed. Please refer to the Current Medication list given to you today.   Labwork: None ordered  Testing/Procedures: Your physician has recommended that you wear a holter monitor for 14 days.  Holter monitors are medical devices that record the heart's electrical activity. Doctors most often use these monitors to diagnose arrhythmias. Arrhythmias are problems with the speed or rhythm of the heartbeat. The monitor is a small, portable device. You can wear one while you do your normal daily activities. This is usually used to diagnose what is causing palpitations/syncope (passing out).  Your physician has requested that you have an echocardiogram. Echocardiography is a painless test that uses sound waves to create images of your heart. It provides your doctor with information about the size and shape of your heart and how well your heart's chambers and valves are working. This procedure takes approximately one hour. There are no restrictions for this procedure.   Follow-Up: Your physician recommends that you schedule a follow-up appointment in: 1 MONTH WITH DR. END   Any Other Special Instructions Will Be Listed Below (If Applicable).    Holter Monitoring A Holter monitor is a small device that is used to detect abnormal heart rhythms. It clips to your clothing and is connected by wires to flat, sticky disks (electrodes) that attach to your chest. It is worn continuously for 24-48 hours. Follow these instructions at home:  Wear your Holter monitor at all times, even while exercising and sleeping, for as long as directed by your health care provider.  Make sure that the Holter monitor is safely clipped to your clothing or close to your body as recommended by your health care provider.  Do not get the monitor or wires wet.  Do not put body lotion or moisturizer on your chest.  Keep your  skin clean.  Keep a diary of your daily activities, such as walking and doing chores. If you feel that your heartbeat is abnormal or that your heart is fluttering or skipping a beat:  Record what you are doing when it happens.  Record what time of day the symptoms occur.  Return your Holter monitor as directed by your health care provider.  Keep all follow-up visits as directed by your health care provider. This is important. Get help right away if:  You feel lightheaded or you faint.  You have trouble breathing.  You feel pain in your chest, upper arm, or jaw.  You feel sick to your stomach and your skin is pale, cool, or damp.  You heartbeat feels unusual or abnormal. This information is not intended to replace advice given to you by your health care provider. Make sure you discuss any questions you have with your health care provider. Document Released: 06/09/2004 Document Revised: 02/17/2016 Document Reviewed: 04/20/2014 Elsevier Interactive Patient Education  2017 [employer]    Echocardiogram An echocardiogram, or echocardiography, uses sound waves (ultrasound) to produce an image of your heart. The echocardiogram is simple, painless, obtained within a short period of time, and offers valuable information to your health care provider. The images from an echocardiogram can provide information such as:  Evidence of coronary artery disease (CAD).  Heart size.  Heart muscle function.  Heart valve function.  Aneurysm detection.  Evidence of a past heart attack.  Fluid buildup around the heart.  Heart muscle thickening.  Assess heart valve function. Tell a health care provider about:  Any  allergies you have.  All medicines you are taking, including vitamins, herbs, eye drops, creams, and over-the-counter medicines.  Any problems you or family members have had with anesthetic medicines.  Any blood disorders you have.  Any surgeries you have had.  Any  medical conditions you have.  Whether you are pregnant or may be pregnant. What happens before the procedure? No special preparation is needed. Eat and drink normally. What happens during the procedure?  In order to produce an image of your heart, gel will be applied to your chest and a wand-like tool (transducer) will be moved over your chest. The gel will help transmit the sound waves from the transducer. The sound waves will harmlessly bounce off your heart to allow the heart images to be captured in real-time motion. These images will then be recorded.  You may need an IV to receive a medicine that improves the quality of the pictures. What happens after the procedure? You may return to your normal schedule including diet, activities, and medicines, unless your health care provider tells you otherwise. This information is not intended to replace advice given to you by your health care provider. Make sure you discuss any questions you have with your health care provider. Document Released: 09/08/2000 Document Revised: 04/29/2016 Document Reviewed: 05/19/2013 Elsevier Interactive Patient Education  2017 [employer].  If you need a refill on your cardiac medications before your next appointment, please call your pharmacy.

## 2017-01-12 NOTE — Progress Notes (Signed)
Follow-up Outpatient Visit Date: 01/12/2017  Chief Complaint: Lightheadedness and coronary artery disease  HPI:  Crystal Roberson is a 51 y.o. year-old female with history of coronary artery disease status post anterior STEMI on 09/23/16, ischemic cardiomyopathy, hypertension, migraine headaches, degenerative disc disease with chronic back pain, and depression, who presents for follow-up of CAD and recent near-syncopal event. I last saw her on 10/02/16. Since that time, she was admitted to [facility] on 11/26/16 due to near-syncope. This had been preceeded by several days of orthopnea that prompted her PCP to start furosemide and potassium. With these medications, her breathing improved. However, on the day of admission she felt very weak and lightheaded, almost passing out. She checked her blood pressure, which was reportedly 60's/30's, prompting her to come to the ED. Evaluation there was unrevealing without orthostatic hypotension (though home BP medications and diuretic had been held). She was also transitioned from ticagrelor to clopidogrel due to shortness of breath.  Today, Crystal Roberson reports that her shortness of breath has improved. She continues to feel weak and occasionally notes lightheadedness with accompanying blood pressure drops as low as 64/30. She has not passed out and does not have accompanying symptoms like chest pain, palpitations, or dyspnea. The episodes happen about 3 times a week and last for a few seconds to minutes. She remains compliant with medications. She has not had any bleeding. She does not sleep well at night but often gets very sleepy in the afternoon. Her husband has noted some snoring and occasional brief apneic episodes. Crystal Roberson has never been evaluated for sleep apnea. She has not needed to use furosemide since leaving the hospital.  --------------------------------------------------------------------------------------------------  Cardiovascular History &  Procedures: Cardiovascular Problems:  Coronary artery disease status post anterior STEMI (09/23/16)  Ischemic cardiomyopathy with severe LV systolic dysfunction  Risk Factors:  Known coronary artery disease, hypertension, and tobacco use  Cath/PCI:  LHC/PCI (09/23/16): Anterior STEMI with 99% mid LAD stenosis with TIMI 2 flow. Mild to moderate, nonobstructive CAD involving the ostial and distal LAD, ostial and mid LCx, and mid/distal RCA. Severely reduced LV contraction (EF 25-35%) with anterior, apical, and apical inferior akinesis. Moderately elevated left ventricular filling pressure (LVEDP 27 mmHg).  CV Surgery:  None  EP Procedures and Devices:  None  Non-Invasive Evaluation(s):  Transthoracic echocardiogram (11/06/16): LVEF 30-35% with severe hypokinesis of mid/apical septal, anterior, and lateral walls, as well as the apex. Moderate to severe MR. Mild left atrial enlargment. Moderate pulmonary hypertension.  Transthoracic echocardiogram (09/13/16): Normal LV size and contraction (EF 60-65%). Normal diastolic function. Trivial mitral and tricuspid regurgitation. Normal RV size and function.  Recent CV Pertinent Labs: Lab Results  Component Value Date   CHOL 240 (H) 09/23/2016   HDL 53 09/23/2016   LDLCALC 156 (H) 09/23/2016   TRIG 153 (H) 09/23/2016   CHOLHDL 4.5 09/23/2016   INR 0.97 09/23/2016   BNP 160.4 (H) 11/26/2016   K 4.0 11/29/2016   K 3.4 (L) 01/04/2012   MG 1.7 11/27/2016   BUN 9 11/29/2016   BUN 12 11/06/2016   BUN 8 01/04/2012   CREATININE 0.77 11/29/2016   CREATININE 0.75 01/04/2012    Past medical and surgical history were reviewed and updated in EPIC.   Outpatient Encounter Prescriptions as of 01/12/2017  Medication Sig  . aspirin 81 MG chewable tablet Chew 1 tablet (81 mg total) by mouth daily.  Marland Kitchen atorvastatin (LIPITOR) 80 MG tablet Take 1 tablet (80 mg total) by mouth daily at  6 PM.  . buPROPion (WELLBUTRIN XL) 150 MG 24 hr tablet TAKE 1  TABLET BY MOUTH ONCE DAILY  . carvedilol (COREG) 3.125 MG tablet Take 1 tablet (3.125 mg total) by mouth 2 (two) times daily with a meal.  . clopidogrel (PLAVIX) 75 MG tablet Take 1 tablet (75 mg total) by mouth daily.  Marland Kitchen FLUoxetine (PROZAC) 20 MG capsule Take 2 capsules (40 mg total) by mouth daily.  . furosemide (LASIX) 20 MG tablet 20 mg daily PRN with a 2-3 lb weight gain in one day, with supplemental K  . lidocaine (LIDODERM) 5 % Place 3 patches onto the skin every 12 (twelve) hours. Remove & Discard patch within 12 hours or as directed by MD  . lisinopril (PRINIVIL,ZESTRIL) 2.5 MG tablet Take 1 tablet (2.5 mg total) by mouth daily.  . magnesium oxide (MAG-OX) 400 (241.3 Mg) MG tablet Take 1 tablet (400 mg total) by mouth 2 (two) times daily.  Marland Kitchen morphine (MS CONTIN) 15 MG 12 hr tablet Take 15 mg by mouth every 12 (twelve) hours.  . nitroGLYCERIN (NITROSTAT) 0.4 MG SL tablet Place 1 tablet (0.4 mg total) under the tongue every 5 (five) minutes as needed for chest pain (CP or SOB).  . potassium chloride SA (K-DUR,KLOR-CON) 20 MEQ tablet Take 1 tablet (20 mEq total) by mouth daily. If takes lasix, take extra dose  . tiZANidine (ZANAFLEX) 2 MG tablet Limit 1 tablet by mouth per day or 2 tablets by mouth per day if tolerated  . zolpidem (AMBIEN) 10 MG tablet Take 1 tablet (10 mg total) by mouth at bedtime as needed for sleep.  . [DISCONTINUED] lidocaine (LIDODERM) 5 % Place 1 patch onto the skin daily. Apply 1-3 patches to skin  for 12 hours then Remove patches for 12 hours. Repeat process if tolerated (Patient taking differently: Place 1 patch onto the skin daily as needed (pain). Apply 1-3 patches to skin  for 12 hours then Remove patches for 12 hours. Repeat process if tolerated)  . [DISCONTINUED] nitrofurantoin, macrocrystal-monohydrate, (MACROBID) 100 MG capsule Take 1 capsule (100 mg total) by mouth 2 (two) times daily.  . [DISCONTINUED] phenazopyridine (PYRIDIUM) 200 MG tablet Take 1 tablet  (200 mg total) by mouth 3 (three) times daily as needed for pain.   No facility-administered encounter medications on file as of 01/12/2017.     Allergies: Pregabalin and Codeine  Social History   Social History  . Marital status: Divorced    Spouse name: N/A  . Number of children: N/A  . Years of education: N/A   Occupational History  . Not on file.   Social History Main Topics  . Smoking status: Former Smoker    Packs/day: 0.75    Years: 30.00    Types: Cigarettes    Quit date: 09/23/2016  . Smokeless tobacco: Never Used  . Alcohol use No  . Drug use: No  . Sexual activity: Not Currently   Other Topics Concern  . Not on file   Social History Narrative  . No narrative on file    Family History  Problem Relation Age of Onset  . Hypertension Mother   . Arthritis Mother   . Hyperlipidemia Mother   . Mental illness Mother   . Hypertension Maternal Grandmother   . Heart disease Maternal Grandmother     Review of Systems: A 12-system review of systems was performed and was negative except as noted in the HPI.  --------------------------------------------------------------------------------------------------  Physical Exam: BP 130/82  Pulse 76   Ht 5' (1.524 m)   Wt 136 lb 12.8 oz (62.1 kg)   BMI 26.72 kg/m   General:  Well-developed, well-nourished woman seated comfortably in the exam room. She is accompanied by her husband. HEENT: No conjunctival pallor or scleral icterus.  Moist mucous membranes.  OP clear. Neck: Supple without lymphadenopathy, thyromegaly, JVD, or HJR. Lungs: Normal work of breathing.  Clear to auscultation bilaterally without wheezes or crackles. Heart: Regular rate and rhythm without murmurs, rubs, or gallops.  Non-displaced PMI. Abd: Bowel sounds present.  Soft, NT/ND without hepatosplenomegaly Ext: No lower extremity edema.  Radial, PT, and DP pulses are 2+ bilaterally. Skin: warm and dry without rash  EKG:  NSR with anteroseptal  Q-waves. Anterolateral T-wave inversions are no longer present compared with prior tracing from 11/26/16 (I have personally reviewed both tracings).  Lab Results  Component Value Date   WBC 5.6 11/29/2016   HGB 11.3 (L) 11/29/2016   HCT 35.5 (L) 11/29/2016   MCV 93.7 11/29/2016   PLT 177 11/29/2016    Lab Results  Component Value Date   NA 138 11/29/2016   K 4.0 11/29/2016   CL 107 11/29/2016   CO2 24 11/29/2016   BUN 9 11/29/2016   CREATININE 0.77 11/29/2016   GLUCOSE 95 11/29/2016   ALT 36 11/26/2016    Lab Results  Component Value Date   CHOL 240 (H) 09/23/2016   HDL 53 09/23/2016   LDLCALC 156 (H) 09/23/2016   TRIG 153 (H) 09/23/2016   CHOLHDL 4.5 09/23/2016    --------------------------------------------------------------------------------------------------  ASSESSMENT AND PLAN: Coronary artery disease status post recent anterior STEMI No recurrent chest pain. Breathing has improved with switching from to category 2 clopidogrel. 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 She notes considerable fatigue and sleepiness, particularly towards the [place] of the day. This is likely multifactorial, including underlying cardiomyopathy, medication side effects, and possible undiagnosed sleep apnea. We have agreed to repeat a limited echocardiogram to reevaluate her LV function and mitral regurgitation. If LVEF remains less than 35%, we will need to consider referral to PT for ICD placement. Though her blood pressure is upper normal today, her episodic lightheadedness with hypotension makes be hesitant to escalate her current evidence based heart failure therapy. We will continue with PRN furosemide and readdress sleep study at our follow-up visit.  Lightheadedness The patient has  not passed out but notes recurrent lightheadedness with low blood pressures a few days a week since her recent hospitalization. Workup at that time was unrevealing with the exception of hypokalemia and evidence of dehydration. We will continue her current medications and repeat a limited echo, as above. I also think it would be prudent to obtain a 14 day event monitor to exclude malignant arrhythmia, given the patient's history of anterior MI and reduced EF.  Hypertension Blood pressure is upper normal today. Given lightheadedness and hypotension, we will not escalate medications at this time.  Hyperlipidemia Crystal Roberson is tolerating high intensity statin therapy well. We will plan for the panel with her next blood draw.  Follow-up: Return to clinic in 1 month.  Yvonne Kendall, MD 01/12/2017 12:26 PM

## 2017-01-12 NOTE — Telephone Encounter (Signed)
Thanks for the update and for your assistance.  Thayer Ohm

## 2017-01-12 NOTE — Telephone Encounter (Signed)
Dr End, Patient is a self pay patient. The [employer] monitor company requires pt to apy [phone] up front to them for pt to get the monitor so I gave the patient the contact info.. I also gave patient the Assistance form to apply for reduced price or free monitor she just has to turn her Tax returns in with the form for proof of income. I expressed to the patient how important it was to get [place] the paper work back for approval asap so that her appts could stay on track she understood took the paper work and was going to call [employer] at [phone].   Lamar Laundry

## 2017-01-16 ENCOUNTER — Encounter: Payer: Self-pay | Admitting: *Deleted

## 2017-01-16 NOTE — Progress Notes (Signed)
Patient ID: Crystal Roberson, female   DOB: Sep 13, 1966, 51 y.o.   MRN: [id number]  Patient enrolled for [employer] to mail a wireless 14 day cardiac event monitor to her home.  She is self pay and given [employer] contact information to arrange for their discounted self pay rate.

## 2017-01-19 ENCOUNTER — Telehealth: Payer: Self-pay | Admitting: Internal Medicine

## 2017-01-19 NOTE — Telephone Encounter (Signed)
New message       Talking to the nurse regarding getting an order for pt to convert from a MCT ([employer] cardiac telemetry) to a CEM for billing purposes.

## 2017-01-23 ENCOUNTER — Other Ambulatory Visit: Payer: Self-pay

## 2017-01-23 ENCOUNTER — Ambulatory Visit (HOSPITAL_COMMUNITY): Payer: Self-pay | Attending: Cardiovascular Disease

## 2017-01-23 DIAGNOSIS — I517 Cardiomegaly: Secondary | ICD-10-CM | POA: Insufficient documentation

## 2017-01-23 DIAGNOSIS — I5042 Chronic combined systolic (congestive) and diastolic (congestive) heart failure: Secondary | ICD-10-CM

## 2017-01-23 DIAGNOSIS — R42 Dizziness and giddiness: Secondary | ICD-10-CM

## 2017-01-24 ENCOUNTER — Ambulatory Visit (INDEPENDENT_AMBULATORY_CARE_PROVIDER_SITE_OTHER): Payer: Self-pay

## 2017-01-24 ENCOUNTER — Telehealth: Payer: Self-pay | Admitting: *Deleted

## 2017-01-24 ENCOUNTER — Encounter: Payer: Self-pay | Admitting: Interventional Cardiology

## 2017-01-24 DIAGNOSIS — I5042 Chronic combined systolic (congestive) and diastolic (congestive) heart failure: Secondary | ICD-10-CM

## 2017-01-24 DIAGNOSIS — R42 Dizziness and giddiness: Secondary | ICD-10-CM

## 2017-01-24 NOTE — Telephone Encounter (Signed)
Message received from Smitty Cords at [employer] patient called to make self pay arrangements of $225.00 for cardiac event monitor.  She was told by Shelby Dubin the price would be $700.00 because she was enrolled as a MCT.  I called our [employer] representative, Annie Paras, and clarified.  All patients are enrolled as MCT, and as self pay, patient was to be given the option of being switched to CEM.  Marthann Schiller, will make appropriate change.  Will call patient to notify.

## 2017-01-30 ENCOUNTER — Encounter: Payer: Self-pay | Admitting: Internal Medicine

## 2017-02-03 NOTE — Telephone Encounter (Signed)
Patient is wearing cardiac event monitor for episodes of syncope. Nicorette from, monitoring agency called to inform that the patient pressed the button, stating that she was passing out.   Tech states that they reviewed her telemetry at the time of the event and she was found to be in NSR. They called her and she did not answer. They left a message.

## 2017-02-05 ENCOUNTER — Telehealth: Payer: Self-pay

## 2017-02-05 NOTE — Addendum Note (Signed)
Addended by: Jacqlyn KraussLANKFORD, Kinslea Frances M on: 02/05/2017 04:01 PM   Modules accepted: Orders

## 2017-02-05 NOTE — Telephone Encounter (Addendum)
Attempted to call patient on home phone and her significant other, Mickeal NeedyJerry Foster (on DPR). Unable to leave voicemail on either phone. Calling patient to see if she is okay. Per [employer] report for her monitor, it says she passed out. The [employer] tech attempted to reach patient twice on 02/03/2017.  Pt was in NSR on the monitor tracing report from 02/03/2017. Will present to DOD, Dr. Katrinka BlazingSmith for further review and recommendation. Will attempt to call patient back.   Called patient back at 1:30 pm. Patient said she must have pressed the button by accident and denied passing out on 02/03/17. She says she had episodes several times this week. She stated during her episodes, she gets a dry mouth, extremely dizzy, everything goes black "like a black cloud is coming over her". She says she has to lie down on the floor when she is having episodes. After the episode, she says she is extremely exhausted and her arms feel really heavy and weak. She gets extremely thirsty and drinks a lot of water. She took her BP after one episode she had earlier this week and her BP was 65/35. She gets extremely dizzy during her episodes. Aside from her episode, she experiences chest tightness at times but she said that is not during her episodes. Advised patient to call back with any questions or concerns. She thanked me for my call.

## 2017-02-05 NOTE — Telephone Encounter (Signed)
Pt advised to stop lisinopril per Dr Okey DupreEnd, she is aware an appointment has been scheduled for her with Dr End 02/09/17 at 10 AM.

## 2017-02-05 NOTE — Telephone Encounter (Signed)
Reviewed Pts monitor tracing with Doctor of the Day (DOD), Dr. Katrinka BlazingSmith who made no further recommendations. Forwarded phone note about pt's episodes to Dr. Okey DupreEnd and his nurse.

## 2017-02-05 NOTE — Telephone Encounter (Signed)
In light of her episodic hypotension, please have Crystal Roberson stop her lisinopril and add her on to my schedule this Friday, if possible.

## 2017-02-09 ENCOUNTER — Encounter: Payer: Self-pay | Admitting: Internal Medicine

## 2017-02-09 ENCOUNTER — Ambulatory Visit (INDEPENDENT_AMBULATORY_CARE_PROVIDER_SITE_OTHER): Payer: Self-pay | Admitting: Internal Medicine

## 2017-02-09 ENCOUNTER — Other Ambulatory Visit: Payer: Self-pay | Admitting: *Deleted

## 2017-02-09 VITALS — BP 150/99 | HR 66 | Ht 65.0 in | Wt 135.0 lb

## 2017-02-09 DIAGNOSIS — I2510 Atherosclerotic heart disease of native coronary artery without angina pectoris: Secondary | ICD-10-CM

## 2017-02-09 DIAGNOSIS — I5042 Chronic combined systolic (congestive) and diastolic (congestive) heart failure: Secondary | ICD-10-CM

## 2017-02-09 DIAGNOSIS — R42 Dizziness and giddiness: Secondary | ICD-10-CM

## 2017-02-09 MED ORDER — POTASSIUM CHLORIDE CRYS ER 20 MEQ PO TBCR: 20.0000 meq | EXTENDED_RELEASE_TABLET | Freq: Every day | ORAL | 11 refills | Status: DC

## 2017-02-09 MED ORDER — LISINOPRIL 2.5 MG PO TABS: 2.5000 mg | ORAL_TABLET | Freq: Every day | ORAL | 1 refills | Status: DC

## 2017-02-09 NOTE — Progress Notes (Signed)
Follow-up Outpatient Visit Date: 02/09/2017  Primary Care Provider: Shona Simpson [address] Laurell Josephs [address] [place] [address]  Chief Complaint: Dizziness  HPI:  Ms. Picking is a 51 y.o. year-old female with history of coronary artery disease status post anterior STEMI and 08/2016, ischemic cardiomyopathy, hypertension, migraine headaches, degenerative disc disease with chronic back pain, and depression, who presents for follow-up of dizziness. I last saw her on 01/12/17, which time she complained of episodic lightheadedness and fatigue with accompanying hypotension. We agreed to place an event monitor to evaluate for an arrhythmic cause of her symptoms. This did not demonstrate any significant arrhythmias. However, Ms. Miranda continues to have these episodes 2-5 times a week. She initially develops a "fuzzy" sensation and then becomes lightheaded. Her vision becomes dark, though she has yet to pass out. She will typically sit down for a minute or two until the episode has passed. She notes profound fatigue as well as thirst at the [place] of the episode. She has checked her blood pressure during some of the episodes, noting that it has been as low as 65/35. There are no clear precipitants, including medications and activities, or time of day. She reports taking her medications as prescribed. She has not had any palpitations.  When she does not have one of these episodes, his [place] notes that her blood pressures typically normal with systolic readings around 120-140. She noted one episode of hypertension with a pressure of 181/101. She obtained this pressure a few minutes after one of the aforementioned dizzy spells. About 2 months ago, she noted increased shortness of breath with two-pillow orthopnea. This seems to be getting gradually better. She took furosemide yesterday because she had gained 3 pounds in 24 hours. She is back to her dry weight of 135 pounds today. She recently noted mild leg  swelling. Of note, we held her lisinopril earlier this week pending clinic evaluation due to the aforementioned dizziness with hypotension.  --------------------------------------------------------------------------------------------------  Cardiovascular History & Procedures: Cardiovascular Problems:  Coronary artery disease status post anterior STEMI (09/23/16)  Ischemic cardiomyopathy with severe LV systolic dysfunction  Risk Factors:  Known coronary artery disease, hypertension, and tobacco use  Cath/PCI:  LHC/PCI (09/23/16): Anterior STEMI with 99% mid LAD stenosis with TIMI 2 flow. Mild to moderate, nonobstructive CAD involving the ostial and distal LAD, ostial and mid LCx, and mid/distal RCA. Severely reduced LV contraction (EF 25-35%) with anterior, apical, and apical inferior akinesis. Moderately elevated left ventricular filling pressure (LVEDP 27 mmHg).  CV Surgery:  None  EP Procedures and Devices:  Event monitor (01/2017): Final report pending preliminary review today demonstrates normal sinus rhythm with no arrhythmias during triggered events.  Non-Invasive Evaluation(s):  Limited TTE (01/23/17): Dilated left ventricle with normal wall thickness. LVEF 35-40% with apical anterior, apical septal, and apical inferior hypokinesis. Mild to moderate MR. Mild left atrial enlargement. Normal RV size and function.  Transthoracic echocardiogram (11/06/16): LVEF 30-35% with severe hypokinesis of mid/apical septal, anterior, and lateral walls, as well as the apex. Moderate to severe MR. Mild left atrial enlargment. Moderate pulmonary hypertension.  Transthoracic echocardiogram (09/13/16): Normal LV size and contraction (EF 60-65%). Normal diastolic function. Trivial mitral and tricuspid regurgitation. Normal RV size and function.  Recent CV Pertinent Labs: Lab Results  Component Value Date   CHOL 240 (H) 09/23/2016   HDL 53 09/23/2016   LDLCALC 156 (H) 09/23/2016   TRIG  153 (H) 09/23/2016   CHOLHDL 4.5 09/23/2016   INR 0.97 09/23/2016  BNP 160.4 (H) 11/26/2016   K 4.0 11/29/2016   K 3.4 (L) 01/04/2012   MG 1.7 11/27/2016   BUN 9 11/29/2016   BUN 12 11/06/2016   BUN 8 01/04/2012   CREATININE 0.77 11/29/2016   CREATININE 0.75 01/04/2012    Past medical and surgical history were reviewed and updated in EPIC.  Outpatient Encounter Prescriptions as of 02/09/2017  Medication Sig  . aspirin 81 MG chewable tablet Chew 1 tablet (81 mg total) by mouth daily.  Marland Kitchen atorvastatin (LIPITOR) 80 MG tablet Take 1 tablet (80 mg total) by mouth daily at 6 PM.  . buPROPion (WELLBUTRIN XL) 150 MG 24 hr tablet TAKE 1 TABLET BY MOUTH ONCE DAILY  . carvedilol (COREG) 3.125 MG tablet Take 1 tablet (3.125 mg total) by mouth 2 (two) times daily with a meal.  . clopidogrel (PLAVIX) 75 MG tablet Take 1 tablet (75 mg total) by mouth daily.  Marland Kitchen FLUoxetine (PROZAC) 20 MG capsule Take 2 capsules (40 mg total) by mouth daily.  . furosemide (LASIX) 20 MG tablet Take 20 mg by mouth daily as needed (for a 2-3 pound weight gain in 1 day).  Marland Kitchen lidocaine (LIDODERM) 5 % Place 3 patches onto the skin every 12 (twelve) hours. Remove & Discard patch within 12 hours or as directed by MD  . magnesium oxide (MAG-OX) 400 (241.3 Mg) MG tablet Take 1 tablet (400 mg total) by mouth 2 (two) times daily.  Marland Kitchen morphine (MS CONTIN) 15 MG 12 hr tablet Take 15 mg by mouth every 12 (twelve) hours.  . nitroGLYCERIN (NITROSTAT) 0.4 MG SL tablet Place 1 tablet (0.4 mg total) under the tongue every 5 (five) minutes as needed for chest pain (CP or SOB).  . potassium chloride SA (K-DUR,KLOR-CON) 20 MEQ tablet Take 1 tablet (20 mEq total) by mouth daily. If takes lasix, take extra dose  . tiZANidine (ZANAFLEX) 2 MG tablet Take 2-4 mg by mouth as directed.  . zolpidem (AMBIEN) 10 MG tablet Take 1 tablet (10 mg total) by mouth at bedtime as needed for sleep.  . [DISCONTINUED] furosemide (LASIX) 20 MG tablet 20 mg daily PRN  with a 2-3 lb weight gain in one day, with supplemental K  . [DISCONTINUED] tiZANidine (ZANAFLEX) 2 MG tablet Limit 1 tablet by mouth per day or 2 tablets by mouth per day if tolerated   No facility-administered encounter medications on file as of 02/09/2017.     Allergies: Pregabalin and Codeine  Social History   Social History  . Marital status: Divorced    Spouse name: N/A  . Number of children: N/A  . Years of education: N/A   Occupational History  . Not on file.   Social History Main Topics  . Smoking status: Former Smoker    Packs/day: 0.75    Years: 30.00    Types: Cigarettes    Quit date: 09/23/2016  . Smokeless tobacco: Never Used  . Alcohol use No  . Drug use: No  . Sexual activity: Not Currently   Other Topics Concern  . Not on file   Social History Narrative  . No narrative on file    Family History  Problem Relation Age of Onset  . Hypertension Mother   . Arthritis Mother   . Hyperlipidemia Mother   . Mental illness Mother   . Hypertension Maternal Grandmother   . Heart disease Maternal Grandmother     Review of Systems: Patient notes occasional neck pain. She has chronic back pain.  Otherwise, a 12-system review of systems was performed and was negative except as noted in the HPI.  --------------------------------------------------------------------------------------------------  Physical Exam: BP 120/80   Pulse 70   Ht 5\' 5"  (1.651 m)   Wt 135 lb (61.2 kg)   BMI 22.47 kg/m  Position Blood pressure (mmHg) Heart rate (bpm)  Lying 145/100, repeat 150/99  65, repeat 66   Sitting 109/72, repeat 134/86  76, repeat 66   Standing 123/89  76   Standing (3 minutes) 149/96  77    General:  Well-developed, well-nourished woman, lying comfortably on the exam table. She is accompanied by her husband. HEENT: No conjunctival pallor or scleral icterus.  Moist mucous membranes.  OP clear. Neck: Supple without lymphadenopathy, thyromegaly, JVD, or HJR.  No  carotid bruit. Lungs: Normal work of breathing.  Clear to auscultation bilaterally without wheezes or crackles. Heart: Regular rate and rhythm with 2/6 holosystolic murmur loudest at the left lower sternal border. No rubs or gallops.  Non-displaced PMI. Abd: Bowel sounds present.  Soft, NT/ND without hepatosplenomegaly Ext: No lower extremity edema.  Radial, PT, and DP pulses are 2+ bilaterally. Skin: warm and dry without rash  EKG:  Normal sinus rhythm with septal Q waves.  Lab Results  Component Value Date   WBC 5.6 11/29/2016   HGB 11.3 (L) 11/29/2016   HCT 35.5 (L) 11/29/2016   MCV 93.7 11/29/2016   PLT 177 11/29/2016    Lab Results  Component Value Date   NA 138 11/29/2016   K 4.0 11/29/2016   CL 107 11/29/2016   CO2 24 11/29/2016   BUN 9 11/29/2016   CREATININE 0.77 11/29/2016   GLUCOSE 95 11/29/2016   ALT 36 11/26/2016    Lab Results  Component Value Date   CHOL 240 (H) 09/23/2016   HDL 53 09/23/2016   LDLCALC 156 (H) 09/23/2016   TRIG 153 (H) 09/23/2016   CHOLHDL 4.5 09/23/2016   --------------------------------------------------------------------------------------------------  ASSESSMENT AND PLAN: Lightheadedness [name] continues to have episodes several days a week. She has not passed out but notes significant lightheadedness with accompanying hypotension. Her blood pressure today is normal to mildly elevated. Orthostatic vital signs demonstrate variable response with initial drop in blood pressure from lying to sitting but otherwise no significant change in heart rate or blood pressure. Preliminary review of event monitor shows no arrhythmias to explain these episodes. It is possible she may have some degree of neurocardiogenic hypotension/presyncope. I have encouraged her to wear compression stockings and to stay well-hydrated while continuing to monitor her weight and swelling closely. Given her heart failure, may be challenging to manage her symptoms  without precipitating decompensated heart failure. We have agreed to obtain carotid Dopplers to exclude cerebrovascular disease contributing to her symptoms. It is possible that some of her pain medication and muscle relaxants may be contributing to her symptoms. I encouraged her to discuss this with her pain specialist. If she continues to have these episodes without clear etiology, we may need to consider renal artery Doppler, urine catecholamines and metanephrines, and even repeat cardiac catheterization.  Coronary artery disease without angina No chest pain since her last visit. Shortness of breath is also improving. We will continue current medication regimen, including dual antiplatelet therapy and high intensity statin therapy. We will plan to repeat a lipid panel to monitor response prior to our next visit.  Chronic systolic heart failure secondary to ischemic cardiomyopathy The patient appears euvolemic, though she noted 3 pounds of weight gain yesterday  prompting her to take a dose of furosemide. Recent echo showed slight improvement in LV contraction, with LVEF now 35-40%. Moderate mitral regurgitation was also noted. We will add back lisinopril 2.5 mg daily today. I will not increase carvedilol at this time. We could consider adding spironolactone in the future.  Follow-up: Return to clinic in 2 months.  Yvonne Kendall, MD 02/10/2017 12:37 PM

## 2017-02-09 NOTE — Patient Instructions (Addendum)
Medication Instructions:  Start lisinopril 2.5mg  daily  Labwork: none  Testing/Procedures: Your physician has requested that you have a carotid duplex. This test is an ultrasound of the carotid arteries in your neck. It looks at blood flow through these arteries that supply the brain with blood. Allow one hour for this exam. There are no restrictions or special instructions.    Follow-Up: Your physician recommends that you schedule a follow-up appointment in: 2 months with Dr End.    Any Other Special Instructions Will Be Listed Below (If Applicable).  Use knee high compression stockings -put them on in the morning and take them off when you go to bed at night.   If you need a refill on your cardiac medications before your next appointment, please call your pharmacy.

## 2017-02-10 DIAGNOSIS — I2510 Atherosclerotic heart disease of native coronary artery without angina pectoris: Secondary | ICD-10-CM | POA: Insufficient documentation

## 2017-02-12 ENCOUNTER — Telehealth: Payer: Self-pay | Admitting: *Deleted

## 2017-02-12 DIAGNOSIS — I5042 Chronic combined systolic (congestive) and diastolic (congestive) heart failure: Secondary | ICD-10-CM

## 2017-02-12 DIAGNOSIS — R42 Dizziness and giddiness: Secondary | ICD-10-CM

## 2017-02-12 NOTE — Telephone Encounter (Signed)
-----   Message from Yvonne Kendallhristopher End, MD sent at 02/11/2017 12:21 PM EDT ----- Please let Crystal Roberson know that I have reviewed the final report for her heart monitor. There were no arrhythmias to explain her episodes of dizziness and fatigue. She should continue with the plan that we discussed on Friday and let us know if her symptoms worsen or if new concerns arise. Thanks.

## 2017-02-12 NOTE — Telephone Encounter (Signed)
Please have Ms. Crystal Roberson hold her carvedilol and lisinopril for a few days. She should let us know if this improves her episodes at all. Thanks.

## 2017-02-12 NOTE — Telephone Encounter (Signed)
I called pt to discuss monitor report. Pt states she had episode about 45 minutes ago,  feeling tired, dry mouth, dizzy, blackness. She sat down at table, checked her BP, it was 70s/40s, does not know her heart rate.  Pt states BP about 10 minutes ago 83/60,  now 94/60, does not know heart rate. Pt states she restarted lisinopril on Saturday, BP yesterday 120/70.  Pt advised I will forward to Dr End for review.

## 2017-02-13 ENCOUNTER — Other Ambulatory Visit: Payer: Self-pay | Admitting: Internal Medicine

## 2017-02-13 DIAGNOSIS — R42 Dizziness and giddiness: Secondary | ICD-10-CM

## 2017-02-14 NOTE — Telephone Encounter (Signed)
Discussed with patient, patient agreed to hold carvedilol and lisinopril for now, will call the first of next week, or before if necessary, to let me know if her episodes are improved off these medications.

## 2017-02-14 NOTE — Telephone Encounter (Signed)
LMTCB

## 2017-02-15 ENCOUNTER — Ambulatory Visit: Payer: Self-pay | Admitting: Internal Medicine

## 2017-02-16 ENCOUNTER — Telehealth: Payer: Self-pay | Admitting: Internal Medicine

## 2017-02-16 DIAGNOSIS — R42 Dizziness and giddiness: Secondary | ICD-10-CM

## 2017-02-16 DIAGNOSIS — I5042 Chronic combined systolic (congestive) and diastolic (congestive) heart failure: Secondary | ICD-10-CM

## 2017-02-16 MED ORDER — LISINOPRIL 2.5 MG PO TABS: 2.5000 mg | ORAL_TABLET | Freq: Every day | ORAL | 3 refills | Status: DC

## 2017-02-16 MED ORDER — CARVEDILOL 3.125 MG PO TABS: 3.1250 mg | ORAL_TABLET | Freq: Two times a day (BID) | ORAL | 3 refills | Status: DC

## 2017-02-16 NOTE — Telephone Encounter (Signed)
Attempted to call the patient two more times and no answer.

## 2017-02-16 NOTE — Telephone Encounter (Signed)
Called patient and left voicemail (on DPR)  to home phone since there was no answer and told her to restart the Carvedilol 3.125 mg twice a day and the Lisinopril 2.5 mg once a day per Dr. Serita KyleEnd's order since her BP has  been elevated. Sent Rx to [facility] (Patient's preferred pharmacy) on file. Will attempt to reach patient again.

## 2017-02-16 NOTE — Telephone Encounter (Signed)
Talked to patient who states her BP has been increased lately. BP 148/122, 160/100, 138/112 are the most recent BP's. She stopped the Carvedilol and Lisinopril on Feb 14, 2017 per Dr. Serita KyleEnd's recommendation due to episodes of dizziness, fatigue, and seeing blackness. Pt still c/o of having episodes of light headedness, feeling like she is going to pass out, and her head feeling "loopy" despite being off the beta blocker and the ACE inhibitor for the past couple days. She is wondering if she should take her BP medications since her BP is elevated or if she should continue being off of them until Tuesday. Advised pt I would forward to Dr. Okey DupreEnd for further review and recommendation. Told her I would call her back once I hear from Dr. Okey DupreEnd. She thanked me for my call.

## 2017-02-16 NOTE — Addendum Note (Signed)
Addended by: Jama FlavorsMINICHELLO, MALLORY L on: 02/16/2017 01:46 PM   Modules accepted: Orders

## 2017-02-16 NOTE — Telephone Encounter (Signed)
Given rising BP, I recommend that she restart carvedilol 3.125 mg BID and lisinopril 2.5 mg daily. She should contact her PCP and/or pain specialist, as her non-cardiac medications could also be contributing to her symptoms. Thanks.

## 2017-02-16 NOTE — Telephone Encounter (Signed)
Tried to get in touch with Pt's significant other, Mickeal NeedyJerry Foster [facility] to make pt aware to restart her Lisinopril and Carvedilol and there was no answer.

## 2017-02-16 NOTE — Telephone Encounter (Signed)
New message    Pt is calling stating she stopped taking lisinorpil and coreg.   Pt c/o BP issue: STAT if pt c/o blurred vision, one-sided weakness or slurred speech  1. What are your last 5 BP readings? 148/122, 160/100, 138/112  2. Are you having any other symptoms (ex. Dizziness, headache, blurred vision, passed out)? lightheadedness  3. What is your BP issue? Pt states she stopped her medication like instructed and bp is high. She is asking if she should take it or keep waiting until Tuesday?

## 2017-02-20 NOTE — Telephone Encounter (Signed)
I spoke with patient, she has restarted coreg 3.125 mg bid and lisinopril 2.5 mg daily. She is aware to follow-up with PCP and/ or pain specialist about non- cardiac medications contributing to her symptoms.

## 2017-02-20 NOTE — Telephone Encounter (Signed)
LMTCB

## 2017-02-26 ENCOUNTER — Ambulatory Visit (HOSPITAL_COMMUNITY)
Admission: RE | Admit: 2017-02-26 | Discharge: 2017-02-26 | Disposition: A | Discharge status: Home / Self Care | Payer: Self-pay | Source: Ambulatory Visit | Attending: Cardiovascular Disease | Admitting: Cardiovascular Disease

## 2017-02-26 DIAGNOSIS — R42 Dizziness and giddiness: Secondary | ICD-10-CM

## 2017-02-26 DIAGNOSIS — I6523 Occlusion and stenosis of bilateral carotid arteries: Secondary | ICD-10-CM | POA: Insufficient documentation

## 2017-03-30 ENCOUNTER — Encounter: Payer: Self-pay | Admitting: Family Medicine

## 2017-03-30 ENCOUNTER — Ambulatory Visit (INDEPENDENT_AMBULATORY_CARE_PROVIDER_SITE_OTHER): Payer: Self-pay | Admitting: Family Medicine

## 2017-03-30 VITALS — BP 156/84 | HR 69 | Temp 98.5°F | Resp 20 | Wt 144.1 lb

## 2017-03-30 DIAGNOSIS — R55 Syncope and collapse: Secondary | ICD-10-CM | POA: Insufficient documentation

## 2017-03-30 DIAGNOSIS — I5042 Chronic combined systolic (congestive) and diastolic (congestive) heart failure: Secondary | ICD-10-CM

## 2017-03-30 DIAGNOSIS — I10 Essential (primary) hypertension: Secondary | ICD-10-CM

## 2017-03-30 DIAGNOSIS — E785 Hyperlipidemia, unspecified: Secondary | ICD-10-CM

## 2017-03-30 DIAGNOSIS — D649 Anemia, unspecified: Secondary | ICD-10-CM

## 2017-03-30 LAB — LIPID PANEL
CHOL/HDL RATIO: 2
Cholesterol: 102 mg/dL (ref 0–200)
HDL: 46.2 mg/dL (ref >39.00)
LDL CALC: 41 mg/dL (ref 0–99)
NONHDL: 56.02
Triglycerides: 73 mg/dL (ref 0.0–149.0)
VLDL: 14.6 mg/dL (ref 0.0–40.0)

## 2017-03-30 LAB — CBC
HEMATOCRIT: 34.3 % — AB (ref 36.0–46.0)
Hemoglobin: 11.7 g/dL — ABNORMAL LOW (ref 12.0–15.0)
MCHC: 34.2 g/dL (ref 30.0–36.0)
MCV: 86.1 fl (ref 78.0–100.0)
Platelets: 154 10*3/uL (ref 150.0–400.0)
RBC: 3.99 Mil/uL (ref 3.87–5.11)
RDW: 12.8 % (ref 11.5–15.5)
WBC: 6.1 10*3/uL (ref 4.0–10.5)

## 2017-03-30 LAB — COMPREHENSIVE METABOLIC PANEL
ALK PHOS: 202 U/L — AB (ref 39–117)
ALT: 43 U/L — ABNORMAL HIGH (ref 0–35)
AST: 36 U/L (ref 0–37)
Albumin: 3.5 g/dL (ref 3.5–5.2)
BUN: 13 mg/dL (ref 6–23)
CHLORIDE: 107 meq/L (ref 96–112)
CO2: 30 meq/L (ref 19–32)
Calcium: 8.7 mg/dL (ref 8.4–10.5)
Creatinine, Ser: 0.82 mg/dL (ref 0.40–1.20)
GFR: 77.38 mL/min (ref >60.00)
Glucose, Bld: 133 mg/dL — ABNORMAL HIGH (ref 70–99)
POTASSIUM: 3.6 meq/L (ref 3.5–5.1)
SODIUM: 141 meq/L (ref 135–145)
TOTAL PROTEIN: 6 g/dL (ref 6.0–8.3)
Total Bilirubin: 0.9 mg/dL (ref 0.2–1.2)

## 2017-03-30 NOTE — Assessment & Plan Note (Signed)
BP elevated today. I discussed with cardiology. Recommended Lasix every other day. Patient to continue her other medications. She has follow-up with cardiology later this month.

## 2017-03-30 NOTE — Progress Notes (Signed)
Subjective:  Patient ID: Crystal Roberson, female    DOB: 1965-09-29  Age: 51 y.o. MRN: [id number]  CC: Follow up  HPI:  51 year old female With CAD status post STEMI and PCI to the LAD, chronic combined CHF class II, chronic pain, hypertension, hyperlipidemia presents for follow-up.  Hypertension  BP elevated today.  Her blood pressures are quite labile.  She has is of time where she has presyncopal episodes.  She has been seen by cardiology with negative cardiac monitoring.  She is currently on lisinopril, and carvedilol.  She is taking Lasix infrequently.  Presyncope  Patient has ongoing issues with transient episodes of presyncope.  She has had negative cardiac monitoring.  Her weight is up.  She has labile blood pressures.  Will discuss with cardiology today.  Chronic combined CHF  Weight is up. Per cardiology her dry weight is around 133-135.  Suggesting her medications is difficult in the setting of lightheadedness/presyncope.  Will discuss with cardiology today.  Lower extremity edema  Patient has ongoing, increasing lower extremity edema.  This is worsening her pain. She has chronic pain as well. Will discuss today.  Social Hx   Social History   Social History  . Marital status: Divorced    Spouse name: N/A  . Number of children: N/A  . Years of education: N/A   Social History Main Topics  . Smoking status: Former Smoker    Packs/day: 0.75    Years: 30.00    Types: Cigarettes    Quit date: 09/23/2016  . Smokeless tobacco: Never Used  . Alcohol use No  . Drug use: No  . Sexual activity: Not Currently   Other Topics Concern  . None   Social History Narrative  . None    Review of Systems  Respiratory: Positive for shortness of breath.   Cardiovascular: Positive for leg swelling. Negative for chest pain.  Musculoskeletal: Positive for arthralgias, back pain and myalgias.  Neurological: Positive for light-headedness.       Presyncope.    Objective:  BP (!) 156/84 (BP Location: Left Arm, Patient Position: Sitting, Cuff Size: Normal)   Pulse 69   Temp 98.5 F (36.9 C) (Oral)   Resp 20   Wt 144 lb 2 oz (65.4 kg)   SpO2 99%   BMI 23.98 kg/m   BP/Weight 03/30/2017 02/09/2017 01/12/2017  Systolic BP 156 150 130  Diastolic BP 84 99 82  Wt. (Lbs) 144.13 135 136.8  BMI 23.98 22.47 26.72   Physical Exam  Constitutional: She is oriented to person, place, and time. She appears well-developed. No distress.  Cardiovascular: Normal rate and regular rhythm.   Pulmonary/Chest: Effort normal. She has no wheezes. She has no rales.  Neurological: She is alert and oriented to person, place, and time.  Psychiatric:  Flat affect.  Vitals reviewed.  Lab Results  Component Value Date   WBC 5.6 11/29/2016   HGB 11.3 (L) 11/29/2016   HCT 35.5 (L) 11/29/2016   PLT 177 11/29/2016   GLUCOSE 95 11/29/2016   CHOL 240 (H) 09/23/2016   TRIG 153 (H) 09/23/2016   HDL 53 09/23/2016   LDLCALC 156 (H) 09/23/2016   ALT 36 11/26/2016   AST 21 11/26/2016   NA 138 11/29/2016   K 4.0 11/29/2016   CL 107 11/29/2016   CREATININE 0.77 11/29/2016   BUN 9 11/29/2016   CO2 24 11/29/2016   TSH 1.804 09/13/2016   INR 0.97 09/23/2016   HGBA1C 5.1 09/23/2016  Assessment & Plan:   Problem List Items Addressed This Visit      Cardiovascular and Mediastinum   Chronic combined systolic and diastolic CHF, NYHA class 2 (HCC)    Weight is up. Patient having dyspnea as well as lower extremity edema. Lasix every other day. Labs today.      Essential hypertension - Primary    BP elevated today. I discussed with cardiology. Recommended Lasix every other day. Patient to continue her other medications. She has follow-up with cardiology later this month.      Relevant Orders   Comprehensive metabolic panel   Pre-syncope    Patient has ongoing issues with episodes of presyncope, where she feels like she's got a pass out. BPs have been labile and  low at times. I discussed her case with cardiology. He and I are both not quite sure what to do with her. We do however agreed that given the fact that her weight  up we should proceed with diuretic. Lasix every other day. If continues to persist, I agree with Dr. Okey DupreEnd - we should pursue with cardiac catheterization and likely referral to EP.        Other Visit Diagnoses    Anemia, unspecified type       Relevant Orders   CBC   Hyperlipidemia, unspecified hyperlipidemia type       Relevant Orders   Lipid panel     Follow-up: Return in about 3 months (around 06/30/2017).  Everlene OtherJayce Elic Vencill DO [facility]

## 2017-03-30 NOTE — Assessment & Plan Note (Signed)
Weight is up. Patient having dyspnea as well as lower extremity edema. Lasix every other day. Labs today.

## 2017-03-30 NOTE — Assessment & Plan Note (Signed)
Patient has ongoing issues with episodes of presyncope, where she feels like she's got a pass out. BPs have been labile and low at times. I discussed her case with cardiology. He and I are both not quite sure what to do with her. We do however agreed that given the fact that her weight  up we should proceed with diuretic. Lasix every other day. If continues to persist, I agree with Dr. Okey DupreEnd - we should pursue with cardiac catheterization and likely referral to EP.

## 2017-03-30 NOTE — Patient Instructions (Signed)
Lasix every other day.  Weigh yourself daily. Goal weight is 135.  Follow up with Dr. Okey DupreEnd on the 20th.  Continue your other meds.  Take care  Dr. Adriana Simasook

## 2017-04-03 NOTE — Telephone Encounter (Signed)
NA

## 2017-04-05 ENCOUNTER — Telehealth: Payer: Self-pay | Admitting: Radiology

## 2017-04-05 NOTE — Telephone Encounter (Signed)
Pt coming in for labs Monday, please place future orders. Thank you 

## 2017-04-06 ENCOUNTER — Other Ambulatory Visit: Payer: Self-pay | Admitting: Family Medicine

## 2017-04-06 DIAGNOSIS — R748 Abnormal levels of other serum enzymes: Secondary | ICD-10-CM

## 2017-04-06 DIAGNOSIS — D649 Anemia, unspecified: Secondary | ICD-10-CM

## 2017-04-09 ENCOUNTER — Other Ambulatory Visit (INDEPENDENT_AMBULATORY_CARE_PROVIDER_SITE_OTHER): Payer: Self-pay

## 2017-04-09 DIAGNOSIS — R748 Abnormal levels of other serum enzymes: Secondary | ICD-10-CM

## 2017-04-09 DIAGNOSIS — D649 Anemia, unspecified: Secondary | ICD-10-CM

## 2017-04-09 LAB — CBC
HCT: 36.8 % (ref 36.0–46.0)
Hemoglobin: 12.5 g/dL (ref 12.0–15.0)
MCHC: 33.8 g/dL (ref 30.0–36.0)
MCV: 86.7 fl (ref 78.0–100.0)
Platelets: 160 10*3/uL (ref 150.0–400.0)
RBC: 4.24 Mil/uL (ref 3.87–5.11)
RDW: 13.3 % (ref 11.5–15.5)
WBC: 5.8 10*3/uL (ref 4.0–10.5)

## 2017-04-09 LAB — COMPREHENSIVE METABOLIC PANEL
ALBUMIN: 3.6 g/dL (ref 3.5–5.2)
ALT: 30 U/L (ref 0–35)
AST: 32 U/L (ref 0–37)
Alkaline Phosphatase: 167 U/L — ABNORMAL HIGH (ref 39–117)
BUN: 14 mg/dL (ref 6–23)
CO2: 27 meq/L (ref 19–32)
CREATININE: 0.73 mg/dL (ref 0.40–1.20)
Calcium: 9.2 mg/dL (ref 8.4–10.5)
Chloride: 107 mEq/L (ref 96–112)
GFR: 88.48 mL/min (ref >60.00)
Glucose, Bld: 146 mg/dL — ABNORMAL HIGH (ref 70–99)
POTASSIUM: 4.2 meq/L (ref 3.5–5.1)
SODIUM: 140 meq/L (ref 135–145)
Total Bilirubin: 0.7 mg/dL (ref 0.2–1.2)
Total Protein: 6.3 g/dL (ref 6.0–8.3)

## 2017-04-09 LAB — VITAMIN B12: VITAMIN B 12: 258 pg/mL (ref 211–911)

## 2017-04-09 LAB — FOLATE: FOLATE: 8 ng/mL (ref >5.9)

## 2017-04-09 LAB — GAMMA GT: GGT: 70 U/L — ABNORMAL HIGH (ref 7–51)

## 2017-04-10 LAB — IRON,TIBC AND FERRITIN PANEL
%SAT: 28 % (ref 11–50)
Ferritin: 29 ng/mL (ref 10–232)
Iron: 79 ug/dL (ref 45–160)
TIBC: 279 ug/dL (ref 250–450)

## 2017-04-11 ENCOUNTER — Telehealth: Payer: Self-pay | Admitting: Radiology

## 2017-04-11 ENCOUNTER — Other Ambulatory Visit: Payer: Self-pay | Admitting: Family Medicine

## 2017-04-11 ENCOUNTER — Other Ambulatory Visit: Payer: Self-pay | Admitting: Radiology

## 2017-04-11 DIAGNOSIS — R7309 Other abnormal glucose: Secondary | ICD-10-CM

## 2017-04-11 NOTE — Telephone Encounter (Signed)
Pt coming in for A1C labs next week, please place future orders. Thank you.

## 2017-04-11 NOTE — Telephone Encounter (Signed)
Ordered future. Thank you.

## 2017-04-11 NOTE — Telephone Encounter (Signed)
Can't order POCT in future. Needs POCT A1C, Diagnosis elevated blood glucose.

## 2017-04-13 ENCOUNTER — Encounter: Payer: Self-pay | Admitting: *Deleted

## 2017-04-13 ENCOUNTER — Encounter: Payer: Self-pay | Admitting: Internal Medicine

## 2017-04-13 ENCOUNTER — Ambulatory Visit (INDEPENDENT_AMBULATORY_CARE_PROVIDER_SITE_OTHER): Payer: Self-pay | Admitting: Internal Medicine

## 2017-04-13 VITALS — BP 110/72 | HR 69 | Ht 65.0 in | Wt 142.8 lb

## 2017-04-13 DIAGNOSIS — I255 Ischemic cardiomyopathy: Secondary | ICD-10-CM

## 2017-04-13 DIAGNOSIS — I2510 Atherosclerotic heart disease of native coronary artery without angina pectoris: Secondary | ICD-10-CM

## 2017-04-13 DIAGNOSIS — R0602 Shortness of breath: Secondary | ICD-10-CM

## 2017-04-13 DIAGNOSIS — I10 Essential (primary) hypertension: Secondary | ICD-10-CM

## 2017-04-13 DIAGNOSIS — E785 Hyperlipidemia, unspecified: Secondary | ICD-10-CM

## 2017-04-13 DIAGNOSIS — I5022 Chronic systolic (congestive) heart failure: Secondary | ICD-10-CM

## 2017-04-13 NOTE — Patient Instructions (Signed)
Medication Instructions:  Stop atorvastatin  Labwork: BMET/CBCd/PT/INR --to be done at your primary care doctor on Monday 04/16/17  Testing/Procedures: Your physician has requested that you have a cardiac catheterization. Cardiac catheterization is used to diagnose and/or treat various heart conditions. Doctors may recommend this procedure for a number of different reasons. The most common reason is to evaluate chest pain. Chest pain can be a symptom of coronary artery disease (CAD), and cardiac catheterization can show whether plaque is narrowing or blocking your heart's arteries. This procedure is also used to evaluate the valves, as well as measure the blood flow and oxygen levels in different parts of your heart. For further information please visit [email or web address] Please follow instruction sheet, as given.  Thursday August 2,2018  Follow-Up: Appointment to be determined after cardiac catheterization on August 2,2018        If you need a refill on your cardiac medications before your next appointment, please call your pharmacy.

## 2017-04-13 NOTE — Progress Notes (Signed)
Follow-up Outpatient Visit Date: 04/13/2017  Primary Care Provider: Shona Simpson [address] Laurell Josephs [address] [place] [address]  Chief Complaint: Dizziness  HPI:  Ms. Pyle is a 51 y.o. year-old female with history of coronary artery disease status post anterior STEMI in 08/2016, ischemic cardiomyopathy (LVEF 35-40% by most recent echo), hypertension, migraine headaches, degenerative disc disease with chronic back pain, and depression, who presents for follow-up of dizziness with intermittent hypotension. I last saw Ms. Lindh on 02/09/17, at which time she continued to have multiple episodes of transient lightheadedness per week. She had not passed out and notes that the episodes would pass sitting down the course of 1 to 2 minutes. Home blood pressure checks during these episodes were notable for readings as low as 65/35. She had noted some intermittent weight gain and was using furosemide intermittently. She had not had any chest pain and also noted that her dyspnea was improving. She was seen by Dr. Adriana Simas earlier this month, at which time her blood pressure and weight were both noted to be elevated. He advised her to take furosemide every other day.  Today, Ms Fiero reports feeling about the same. She continues to have several episodes of lightheadedness and low blood pressure every week. She also notes considerable pain in her legs and arms. There is no clear precipitant but often seems worsen when she first starts to move or gets up in the morning. She has continues to have chronic back pain as well and remains on morphine. She has considerable shortness of breath with mild activity, such as walking across the parking lot. She reports intermittent leg edema, which is not clearly dependent. She has been using furosemide every other day, as directed by Dr. Adriana Simas, but has not had much improvement in her edema or weight loss. She only wears compression stockings sporadically. She denies chest  pain and palpitations. She has 1-2 pillow orthopnea, which is stable.  --------------------------------------------------------------------------------------------------  Cardiovascular History & Procedures: Cardiovascular Problems:  Coronary artery disease status post anterior STEMI (09/23/16)  Ischemic cardiomyopathy with severe LV systolic dysfunction  Risk Factors:  Known coronary artery disease, hypertension, and tobacco use  Cath/PCI:  LHC/PCI (09/23/16): Anterior STEMI with 99% mid LAD stenosis with TIMI 2 flow. Mild to moderate, nonobstructive CAD involving the ostial and distal LAD, ostial and mid LCx, and mid/distal RCA. Severely reduced LV contraction (EF 25-35%) with anterior, apical, and apical inferior akinesis. Moderately elevated left ventricular filling pressure (LVEDP 27 mmHg).  CV Surgery:  None  EP Procedures and Devices:  Event monitor (01/2017): Final report pending preliminary review today demonstrates normal sinus rhythm with no arrhythmias during triggered events.  Non-Invasive Evaluation(s):  Limited TTE (01/23/17): Dilated left ventricle with normal wall thickness. LVEF 35-40% with apical anterior, apical septal, and apical inferior hypokinesis. Mild to moderate MR. Mild left atrial enlargement. Normal RV size and function.  Transthoracic echocardiogram (11/06/16): LVEF 30-35% with severe hypokinesis of mid/apical septal, anterior, and lateral walls, as well as the apex. Moderate to severe MR. Mild left atrial enlargment. Moderate pulmonary hypertension.  Transthoracic echocardiogram (09/13/16): Normal LV size and contraction (EF 60-65%). Normal diastolic function. Trivial mitral and tricuspid regurgitation. Normal RV size and function.  Recent CV Pertinent Labs: Lab Results  Component Value Date   CHOL 102 03/30/2017   HDL 46.20 03/30/2017   LDLCALC 41 03/30/2017   TRIG 73.0 03/30/2017   CHOLHDL 2 03/30/2017   INR 0.97 09/23/2016   BNP 160.4  (H) 11/26/2016  K 4.2 04/09/2017   K 3.4 (L) 01/04/2012   MG 1.7 11/27/2016   BUN 14 04/09/2017   BUN 12 11/06/2016   BUN 8 01/04/2012   CREATININE 0.73 04/09/2017   CREATININE 0.75 01/04/2012    Past medical and surgical history were reviewed and updated in EPIC.  Outpatient Encounter Prescriptions as of 04/13/2017  Medication Sig  . aspirin 81 MG chewable tablet Chew 1 tablet (81 mg total) by mouth daily.  Marland Kitchen. atorvastatin (LIPITOR) 80 MG tablet Take 1 tablet (80 mg total) by mouth daily at 6 PM.  . buPROPion (WELLBUTRIN XL) 150 MG 24 hr tablet TAKE 1 TABLET BY MOUTH ONCE DAILY  . carvedilol (COREG) 3.125 MG tablet Take 1 tablet (3.125 mg total) by mouth 2 (two) times daily.  . clopidogrel (PLAVIX) 75 MG tablet Take 1 tablet (75 mg total) by mouth daily.  Marland Kitchen. FLUoxetine (PROZAC) 20 MG capsule Take 2 capsules (40 mg total) by mouth daily.  . furosemide (LASIX) 20 MG tablet Take 20 mg by mouth daily as needed (for a 2-3 pound weight gain in 1 day).  Marland Kitchen. lidocaine (LIDODERM) 5 % Place 3 patches onto the skin every 12 (twelve) hours. Remove & Discard patch within 12 hours or as directed by MD  . lisinopril (PRINIVIL,ZESTRIL) 2.5 MG tablet Take 1 tablet (2.5 mg total) by mouth daily.  . magnesium oxide (MAG-OX) 400 (241.3 Mg) MG tablet Take 1 tablet (400 mg total) by mouth 2 (two) times daily.  Marland Kitchen. morphine (MS CONTIN) 15 MG 12 hr tablet Take 15 mg by mouth every 12 (twelve) hours.  . nitroGLYCERIN (NITROSTAT) 0.4 MG SL tablet Place 1 tablet (0.4 mg total) under the tongue every 5 (five) minutes as needed for chest pain (CP or SOB).  . potassium chloride SA (K-DUR,KLOR-CON) 20 MEQ tablet Take 1 tablet (20 mEq total) by mouth daily. If takes lasix, take extra dose  . tiZANidine (ZANAFLEX) 2 MG tablet Take 2-4 mg by mouth as directed.  . zolpidem (AMBIEN) 10 MG tablet Take 1 tablet (10 mg total) by mouth at bedtime as needed for sleep.   No facility-administered encounter medications on file as of  04/13/2017.     Allergies: Pregabalin and Codeine  Social History   Social History  . Marital status: Divorced    Spouse name: N/A  . Number of children: N/A  . Years of education: N/A   Occupational History  . Not on file.   Social History Main Topics  . Smoking status: Former Smoker    Packs/day: 0.75    Years: 30.00    Types: Cigarettes    Quit date: 09/23/2016  . Smokeless tobacco: Never Used  . Alcohol use No  . Drug use: No  . Sexual activity: Not Currently   Other Topics Concern  . Not on file   Social History Narrative  . No narrative on file    Family History  Problem Relation Age of Onset  . Hypertension Mother   . Arthritis Mother   . Hyperlipidemia Mother   . Mental illness Mother   . Hypertension Maternal Grandmother   . Heart disease Maternal Grandmother     Review of Systems: A 12-system ROS was performed and was negative, except as noted in the HPI.  --------------------------------------------------------------------------------------------------  Physical Exam: BP 110/72   Pulse 69   Ht 5\' 5"  (1.651 m)   Wt 142 lb 12.8 oz (64.8 kg)   SpO2 99%   BMI 23.76 kg/m  General:  Well-developed woman, seated comfortably in the exam room. She is accompanied by her daughter-in-law. HEENT: No conjunctival pallor or scleral icterus.  Moist mucous membranes.  OP clear. Neck: Supple without lymphadenopathy, thyromegaly, JVD, or HJR.  No carotid bruit. Lungs: Normal work of breathing.  Clear to auscultation bilaterally without wheezes or crackles. Heart: Regular rate and rhythm without murmurs, rubs, or gallops.  Non-displaced PMI. Abd: Bowel sounds present.  Soft, NT/ND without hepatosplenomegaly Ext: No lower extremity edema.  Radial, PT, and DP pulses are 2+ bilaterally. Skin: Warm and dry without rash.  EKG: Normal sinus rhythm with possible right atrial enlargement, septal Q-waves, and non-specific T-wave changes.  Lab Results  Component  Value Date   WBC 5.8 04/09/2017   HGB 12.5 04/09/2017   HCT 36.8 04/09/2017   MCV 86.7 04/09/2017   PLT 160.0 04/09/2017    Lab Results  Component Value Date   NA 140 04/09/2017   K 4.2 04/09/2017   CL 107 04/09/2017   CO2 27 04/09/2017   BUN 14 04/09/2017   CREATININE 0.73 04/09/2017   GLUCOSE 146 (H) 04/09/2017   ALT 30 04/09/2017    Lab Results  Component Value Date   CHOL 102 03/30/2017   HDL 46.20 03/30/2017   LDLCALC 41 03/30/2017   TRIG 73.0 03/30/2017   CHOLHDL 2 03/30/2017   --------------------------------------------------------------------------------------------------  ASSESSMENT AND PLAN: Chronic systolic heart failure secondary to ischemic cardiomyopathy with shortness of breath [name] appears euvolemic but is quite functionally limited with NYHA class III symptoms. Her weight is down slightly from her prior visit with Dr. Adriana Simasook, though she continues to report orthopnea and intermittent leg edema. Wew ill continue her current medication regimen; I am hesitant to escalate furosemide and her evidence-based HF medications due to her intermittent hypotension. We have agreed to perform left and right heart catheterization to better understand her hemodynamics as well as to reassess her CAD. I have reviewed the risks, indications, and alternatives to cardiac catheterization, possible angioplasty, and stenting with the patient. Risks include but are not limited to bleeding, infection, vascular injury, stroke, myocardial infection, arrhythmia, kidney injury, radiation-related injury in the case of prolonged fluoroscopy use, emergency cardiac surgery, and death. The patient understands the risks of serious complication is 1-2 in 1000 with diagnostic cardiac cath and 1-2% or less with angioplasty/stenting. [name] reports being scheduled for labs with Dr. Adriana Simasook next week; I will see if he can check a CBC and INR at that time.  Coronary artery disease without angina No  chest pain. Continue current medication regimen, including aspirin and ticagrelor. Due to diffuse myalgias, we will proceed with statin holiday to see if her symptoms improve.  Hyperlipidemia LDL was at goal earlier this month. However, given severe myalgias, we will hold atorvastatin for a month to see if her pain improves. If so, we will consider switching to low-dose rosuvastatin with careful uptitration.  Hypertension Blood pressure is normal today. No medication changes at this time.  Follow-up: To be determined based on cath results.  Yvonne Kendallhristopher Jamesia Linnen, MD 04/13/2017 7:56 AM

## 2017-04-14 DIAGNOSIS — E785 Hyperlipidemia, unspecified: Secondary | ICD-10-CM | POA: Insufficient documentation

## 2017-04-14 DIAGNOSIS — E782 Mixed hyperlipidemia: Secondary | ICD-10-CM | POA: Insufficient documentation

## 2017-04-14 DIAGNOSIS — I255 Ischemic cardiomyopathy: Secondary | ICD-10-CM | POA: Insufficient documentation

## 2017-04-16 ENCOUNTER — Telehealth: Payer: Self-pay | Admitting: Internal Medicine

## 2017-04-16 ENCOUNTER — Telehealth: Payer: Self-pay | Admitting: *Deleted

## 2017-04-16 ENCOUNTER — Other Ambulatory Visit (INDEPENDENT_AMBULATORY_CARE_PROVIDER_SITE_OTHER): Payer: Self-pay

## 2017-04-16 DIAGNOSIS — I2510 Atherosclerotic heart disease of native coronary artery without angina pectoris: Secondary | ICD-10-CM

## 2017-04-16 DIAGNOSIS — R0602 Shortness of breath: Secondary | ICD-10-CM

## 2017-04-16 DIAGNOSIS — R7309 Other abnormal glucose: Secondary | ICD-10-CM

## 2017-04-16 LAB — CBC WITH DIFFERENTIAL/PLATELET
BASOS PCT: 1 %
Basophils Absolute: 59 cells/uL (ref 0–200)
EOS PCT: 1 %
Eosinophils Absolute: 59 cells/uL (ref 15–500)
HCT: 37.3 % (ref 35.0–45.0)
HEMOGLOBIN: 12.2 g/dL (ref 11.7–15.5)
LYMPHS ABS: 1357 {cells}/uL (ref 850–3900)
Lymphocytes Relative: 23 %
MCH: 28.5 pg (ref 27.0–33.0)
MCHC: 32.7 g/dL (ref 32.0–36.0)
MCV: 87.1 fL (ref 80.0–100.0)
MONOS PCT: 6 %
MPV: 10.7 fL (ref 7.5–12.5)
Monocytes Absolute: 354 cells/uL (ref 200–950)
NEUTROS ABS: 4071 {cells}/uL (ref 1500–7800)
Neutrophils Relative %: 69 %
PLATELETS: 186 10*3/uL (ref 140–400)
RBC: 4.28 MIL/uL (ref 3.80–5.10)
RDW: 13.5 % (ref 11.0–15.0)
WBC: 5.9 10*3/uL (ref 3.8–10.8)

## 2017-04-16 LAB — BASIC METABOLIC PANEL
BUN: 14 mg/dL (ref 7–25)
CALCIUM: 8.7 mg/dL (ref 8.6–10.4)
CO2: 27 mmol/L (ref 20–31)
CREATININE: 0.74 mg/dL (ref 0.50–1.05)
Chloride: 108 mmol/L (ref 98–110)
Glucose, Bld: 82 mg/dL (ref 65–99)
Potassium: 4 mmol/L (ref 3.5–5.3)
SODIUM: 141 mmol/L (ref 135–146)

## 2017-04-16 LAB — POCT GLYCOSYLATED HEMOGLOBIN (HGB A1C): Hemoglobin A1C: 5.4

## 2017-04-16 NOTE — Telephone Encounter (Signed)
I spoke with Shanda BumpsJessica at Dr Patsey Bertholdook's office, orders for CBCd/BMET/PT/INR placed in Epic to be done today at their office.

## 2017-04-16 NOTE — Addendum Note (Signed)
Addended by: Penne LashWIGGINS, Brionne Mertz N on: 04/16/2017 10:54 AM   Modules accepted: Orders

## 2017-04-16 NOTE — Telephone Encounter (Signed)
New message   Pt verbalized that she was told that she was to have a cath and she needed blood   work done and she went to her PCP today and she needs orders sent there so it can be taken care of   Dr.Jayce Glendell [place]

## 2017-04-16 NOTE — Telephone Encounter (Signed)
FYI [facility] has placed orders to be drawn in this office today .

## 2017-04-17 LAB — PROTIME-INR
INR: 1
Prothrombin Time: 10.6 s (ref 9.0–11.5)

## 2017-04-18 ENCOUNTER — Other Ambulatory Visit: Payer: Self-pay

## 2017-04-19 IMAGING — CR DG CHEST 1V PORT
2 series · 2 of 2 positions shown · non-contrast
Comparison: 09/12/2016

CLINICAL DATA: Chest pain, nausea, vomiting and shortness of
breath.

EXAM:
PORTABLE CHEST 1 VIEW

[AP (1 of 2)]
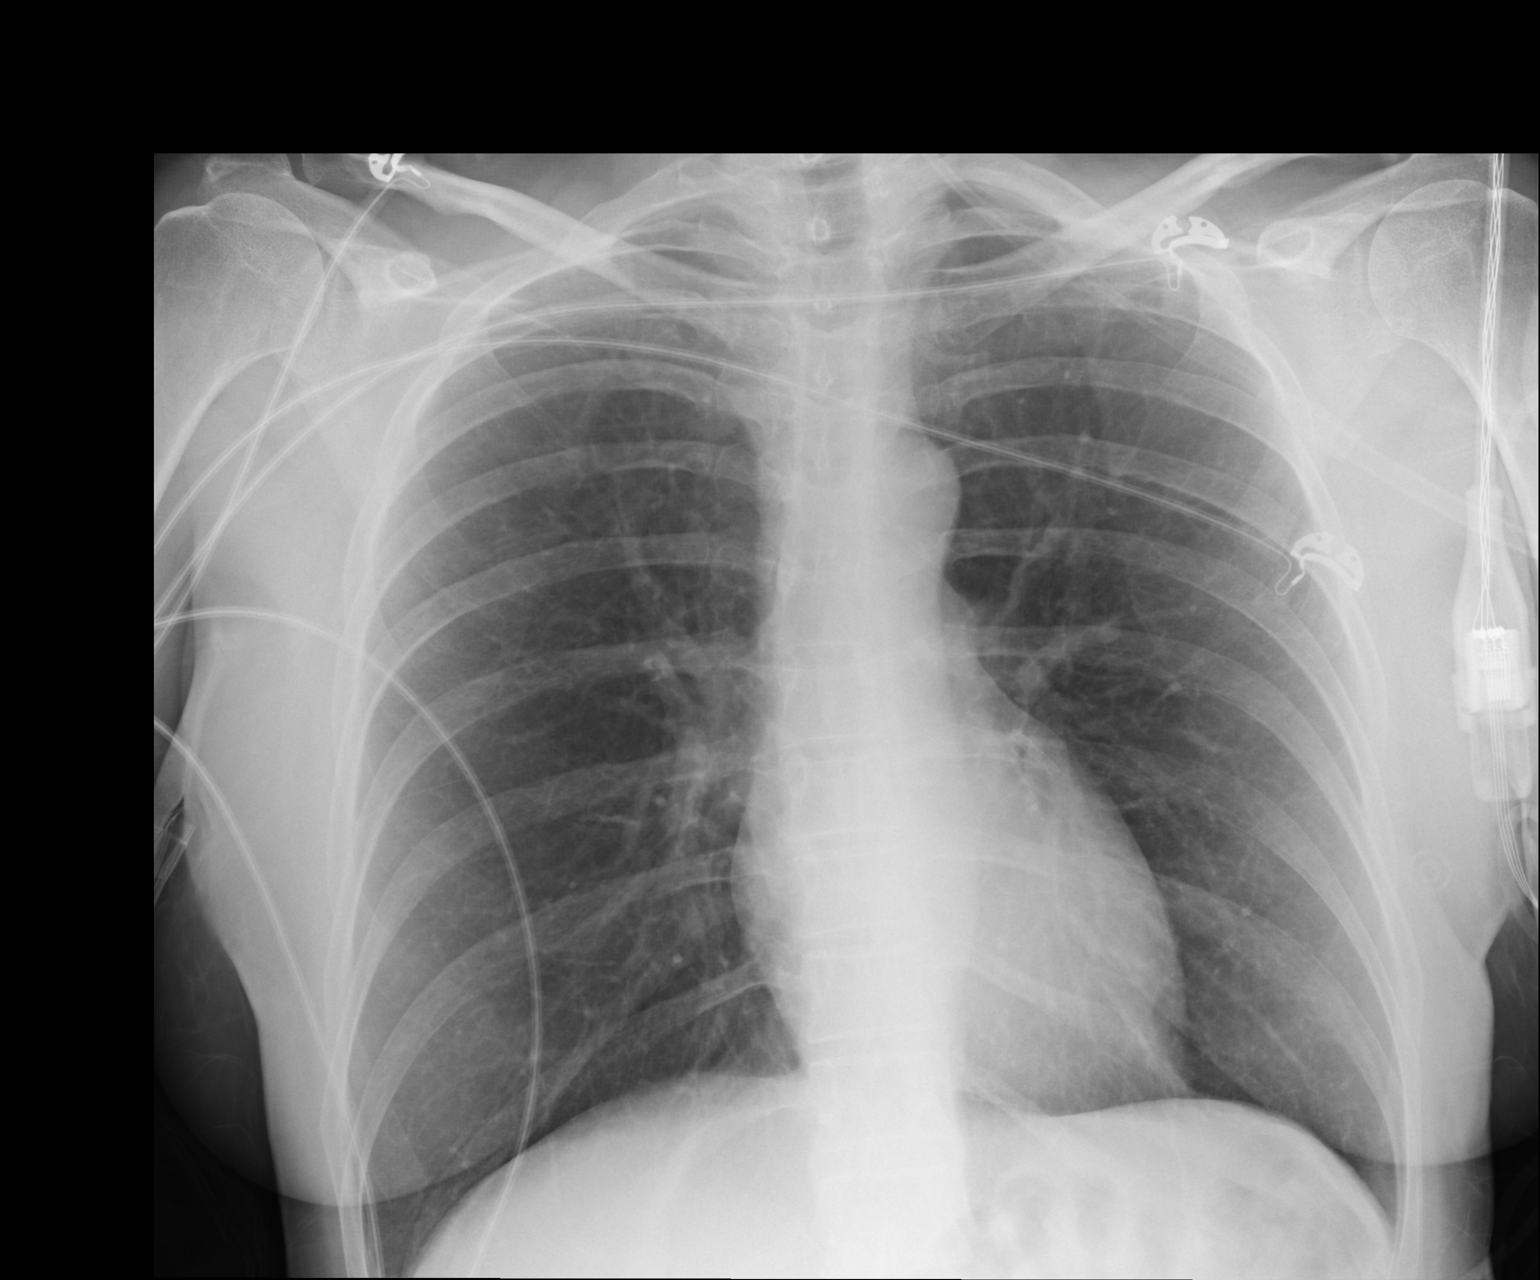

[AP (2 of 2)]
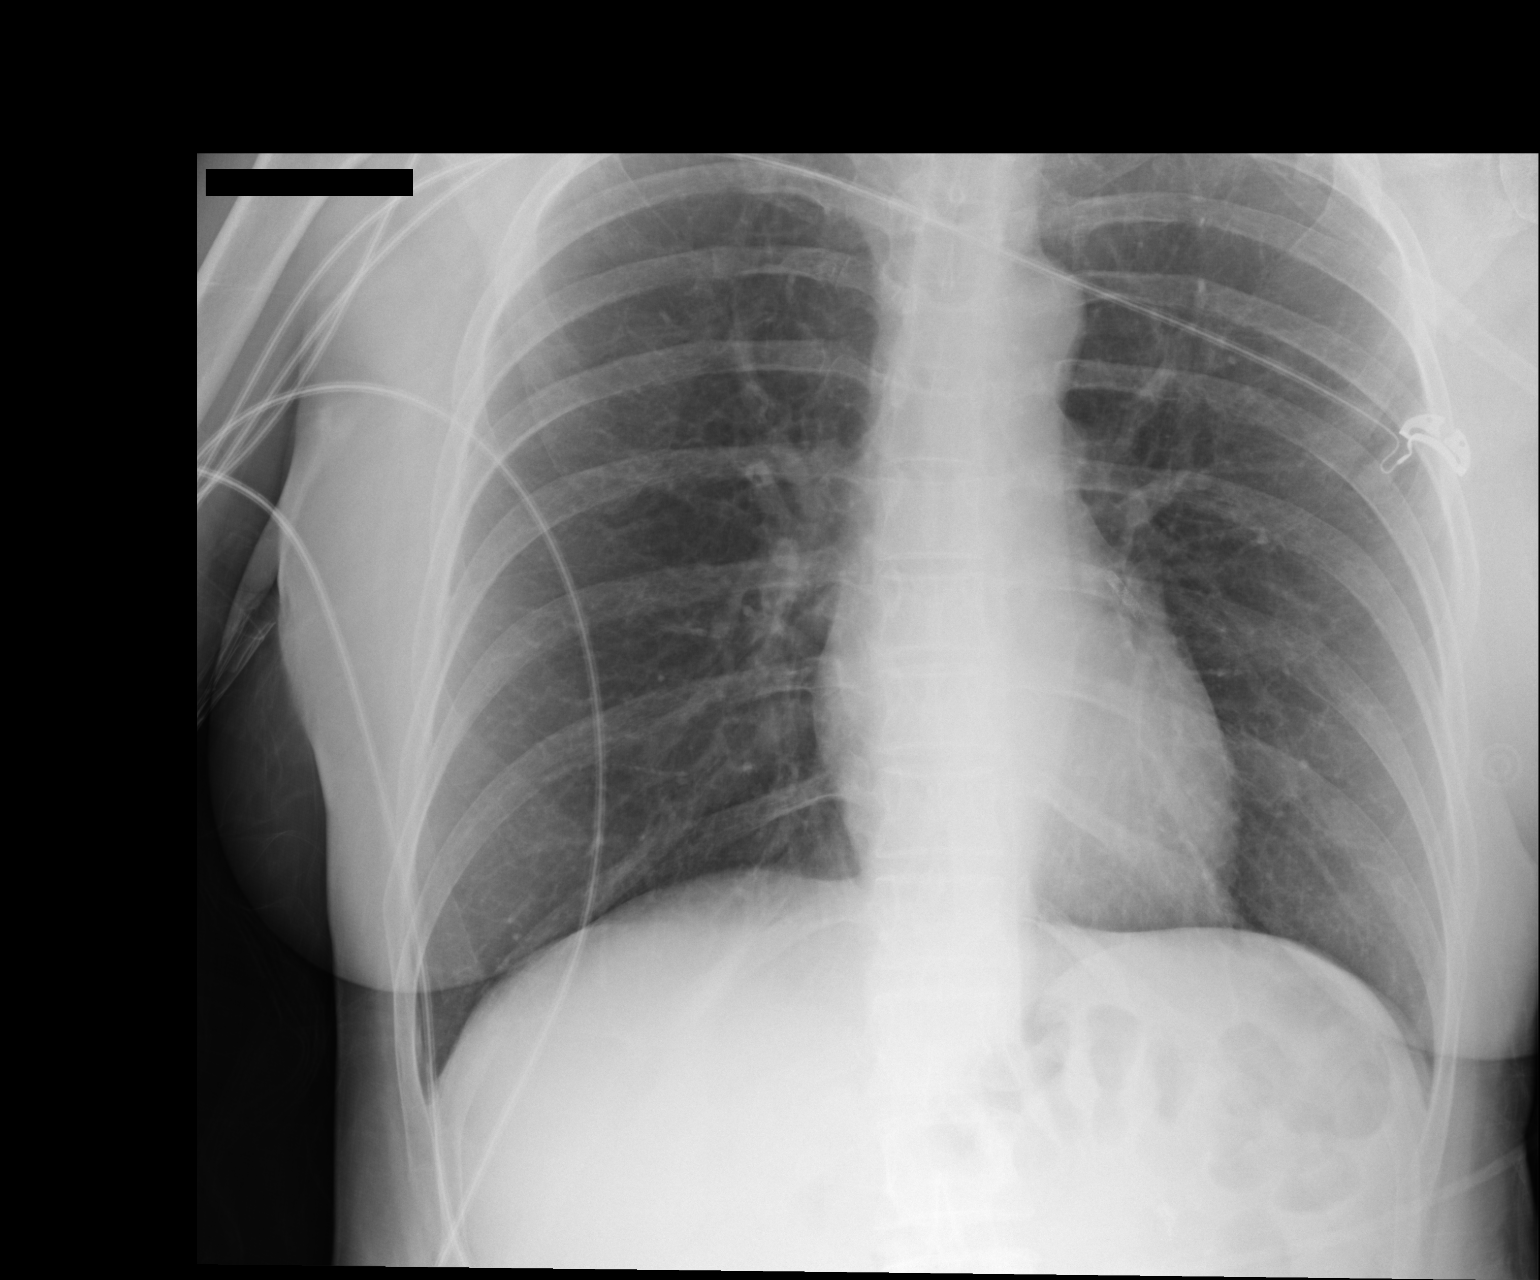

[2 of 2 positions shown; findings below may reference images not displayed]

FINDINGS: The heart size and mediastinal contours are within normal limits.
There is no evidence of pulmonary edema, consolidation,
pneumothorax, nodule or pleural fluid. The visualized skeletal
structures are unremarkable.
IMPRESSION: No active disease.

## 2017-04-23 ENCOUNTER — Other Ambulatory Visit: Payer: Self-pay | Admitting: Internal Medicine

## 2017-04-23 DIAGNOSIS — I5022 Chronic systolic (congestive) heart failure: Secondary | ICD-10-CM

## 2017-04-24 ENCOUNTER — Telehealth: Payer: Self-pay

## 2017-04-24 NOTE — Telephone Encounter (Signed)
[facility] scheduled for:  04/26/2017 @ 1030 Verified arrival time and place:  NT @ 0800 Confirmed AM meds to be taken pre-cath with sip of water:  Notified to take ASA and Plavix prior to arrival.  Notified to hold lasix Notified patient must have responsible person to drive home post procedure and observe patient for 24 hours: Addl concerns:  Left number to office if Pt has further concerns

## 2017-04-26 ENCOUNTER — Ambulatory Visit (HOSPITAL_COMMUNITY)
Admission: RE | Admit: 2017-04-26 | Discharge: 2017-04-26 | Disposition: A | Discharge status: Home / Self Care | Payer: Self-pay | Source: Ambulatory Visit | Attending: Internal Medicine | Admitting: Internal Medicine

## 2017-04-26 ENCOUNTER — Encounter (HOSPITAL_COMMUNITY)
Admission: RE | Disposition: A | Discharge status: Home / Self Care | Payer: Self-pay | Source: Ambulatory Visit | Attending: Internal Medicine

## 2017-04-26 DIAGNOSIS — G43909 Migraine, unspecified, not intractable, without status migrainosus: Secondary | ICD-10-CM | POA: Insufficient documentation

## 2017-04-26 DIAGNOSIS — I252 Old myocardial infarction: Secondary | ICD-10-CM | POA: Insufficient documentation

## 2017-04-26 DIAGNOSIS — I255 Ischemic cardiomyopathy: Secondary | ICD-10-CM | POA: Insufficient documentation

## 2017-04-26 DIAGNOSIS — I959 Hypotension, unspecified: Secondary | ICD-10-CM | POA: Insufficient documentation

## 2017-04-26 DIAGNOSIS — G8929 Other chronic pain: Secondary | ICD-10-CM | POA: Insufficient documentation

## 2017-04-26 DIAGNOSIS — Z7902 Long term (current) use of antithrombotics/antiplatelets: Secondary | ICD-10-CM | POA: Insufficient documentation

## 2017-04-26 DIAGNOSIS — I272 Pulmonary hypertension, unspecified: Secondary | ICD-10-CM | POA: Insufficient documentation

## 2017-04-26 DIAGNOSIS — I110 Hypertensive heart disease with heart failure: Secondary | ICD-10-CM | POA: Insufficient documentation

## 2017-04-26 DIAGNOSIS — F329 Major depressive disorder, single episode, unspecified: Secondary | ICD-10-CM | POA: Insufficient documentation

## 2017-04-26 DIAGNOSIS — Z7982 Long term (current) use of aspirin: Secondary | ICD-10-CM | POA: Insufficient documentation

## 2017-04-26 DIAGNOSIS — E785 Hyperlipidemia, unspecified: Secondary | ICD-10-CM | POA: Insufficient documentation

## 2017-04-26 DIAGNOSIS — Z87891 Personal history of nicotine dependence: Secondary | ICD-10-CM | POA: Insufficient documentation

## 2017-04-26 DIAGNOSIS — Z955 Presence of coronary angioplasty implant and graft: Secondary | ICD-10-CM | POA: Insufficient documentation

## 2017-04-26 DIAGNOSIS — I5022 Chronic systolic (congestive) heart failure: Secondary | ICD-10-CM | POA: Diagnosis present

## 2017-04-26 DIAGNOSIS — I2510 Atherosclerotic heart disease of native coronary artery without angina pectoris: Secondary | ICD-10-CM | POA: Insufficient documentation

## 2017-04-26 DIAGNOSIS — R55 Syncope and collapse: Secondary | ICD-10-CM | POA: Diagnosis present

## 2017-04-26 HISTORY — PX: ULTRASOUND GUIDANCE FOR VASCULAR ACCESS: SHX6516

## 2017-04-26 HISTORY — PX: RIGHT/LEFT HEART CATH AND CORONARY ANGIOGRAPHY: CATH118266

## 2017-04-26 LAB — POCT I-STAT 3, ART BLOOD GAS (G3+)
ACID-BASE EXCESS: 1 mmol/L (ref 0.0–2.0)
BICARBONATE: 24.8 mmol/L (ref 20.0–28.0)
BICARBONATE: 27.1 mmol/L (ref 20.0–28.0)
O2 SAT: 64 %
O2 SAT: 93 %
PCO2 ART: 39.4 mmHg (ref 32.0–48.0)
PH ART: 7.372 (ref 7.350–7.450)
TCO2: 26 mmol/L (ref 0–100)
TCO2: 28 mmol/L (ref 0–100)
pCO2 arterial: 46.7 mmHg (ref 32.0–48.0)
pH, Arterial: 7.407 (ref 7.350–7.450)
pO2, Arterial: 35 mmHg — CL (ref 83.0–108.0)
pO2, Arterial: 66 mmHg — ABNORMAL LOW (ref 83.0–108.0)

## 2017-04-26 SURGERY — RIGHT/LEFT HEART CATH AND CORONARY ANGIOGRAPHY
Anesthesia: LOCAL

## 2017-04-26 MED ORDER — MIDAZOLAM HCL 2 MG/2ML IJ SOLN
INTRAMUSCULAR | Status: AC
  Filled 2017-04-26: qty 2

## 2017-04-26 MED ORDER — SODIUM CHLORIDE 0.9% FLUSH: 3.0000 mL | Freq: Two times a day (BID) | INTRAVENOUS | Status: DC

## 2017-04-26 MED ORDER — SODIUM CHLORIDE 0.9 % IV SOLN
INTRAVENOUS | Status: AC
  Administered 2017-04-26: 09:00:00 via INTRAVENOUS

## 2017-04-26 MED ORDER — FENTANYL CITRATE (PF) 100 MCG/2ML IJ SOLN
INTRAMUSCULAR | Status: AC
Start: 2017-04-26 — End: 2017-04-26
  Filled 2017-04-26: qty 2

## 2017-04-26 MED ORDER — HEPARIN SODIUM (PORCINE) 1000 UNIT/ML IJ SOLN
INTRAMUSCULAR | Status: DC | PRN
  Administered 2017-04-26: 3000 [IU] via INTRAVENOUS

## 2017-04-26 MED ORDER — VERAPAMIL HCL 2.5 MG/ML IV SOLN
INTRAVENOUS | Status: AC
  Filled 2017-04-26: qty 2

## 2017-04-26 MED ORDER — ASPIRIN 81 MG PO CHEW: 81.0000 mg | CHEWABLE_TABLET | ORAL | Status: DC

## 2017-04-26 MED ORDER — LIDOCAINE HCL (PF) 1 % IJ SOLN
INTRAMUSCULAR | Status: AC
  Filled 2017-04-26: qty 30

## 2017-04-26 MED ORDER — FUROSEMIDE 20 MG PO TABS: 20.0000 mg | ORAL_TABLET | ORAL | 5 refills | Status: DC

## 2017-04-26 MED ORDER — MIDAZOLAM HCL 2 MG/2ML IJ SOLN
INTRAMUSCULAR | Status: DC | PRN
Start: 2017-04-26 — End: 2017-04-26
  Administered 2017-04-26: 1 mg via INTRAVENOUS

## 2017-04-26 MED ORDER — SODIUM CHLORIDE 0.9% FLUSH: 3.0000 mL | INTRAVENOUS | Status: DC | PRN

## 2017-04-26 MED ORDER — IOPAMIDOL (ISOVUE-370) INJECTION 76%
INTRAVENOUS | Status: AC
  Filled 2017-04-26: qty 100

## 2017-04-26 MED ORDER — FENTANYL CITRATE (PF) 100 MCG/2ML IJ SOLN
INTRAMUSCULAR | Status: DC | PRN
Start: 2017-04-26 — End: 2017-04-26
  Administered 2017-04-26: 25 ug via INTRAVENOUS

## 2017-04-26 MED ORDER — HEPARIN SODIUM (PORCINE) 1000 UNIT/ML IJ SOLN
INTRAMUSCULAR | Status: AC
  Filled 2017-04-26: qty 1

## 2017-04-26 MED ORDER — HEPARIN (PORCINE) IN NACL 2-0.9 UNIT/ML-% IJ SOLN
INTRAMUSCULAR | Status: AC | PRN
  Administered 2017-04-26: 1000 mL

## 2017-04-26 MED ORDER — VERAPAMIL HCL 2.5 MG/ML IV SOLN
INTRAVENOUS | Status: DC | PRN
  Administered 2017-04-26: 3 mL via INTRA_ARTERIAL

## 2017-04-26 MED ORDER — SODIUM CHLORIDE 0.9 % IV SOLN: INTRAVENOUS | Status: AC

## 2017-04-26 MED ORDER — HEPARIN (PORCINE) IN NACL 2-0.9 UNIT/ML-% IJ SOLN
INTRAMUSCULAR | Status: AC
  Filled 2017-04-26: qty 1000

## 2017-04-26 MED ORDER — IOPAMIDOL (ISOVUE-370) INJECTION 76%
INTRAVENOUS | Status: DC | PRN
  Administered 2017-04-26: 55 mL via INTRAVENOUS

## 2017-04-26 MED ORDER — NITROGLYCERIN 1 MG/10 ML FOR IR/CATH LAB
INTRA_ARTERIAL | Status: AC
  Filled 2017-04-26: qty 10

## 2017-04-26 MED ORDER — POTASSIUM CHLORIDE CRYS ER 20 MEQ PO TBCR: 20.0000 meq | EXTENDED_RELEASE_TABLET | Freq: Every day | ORAL | 11 refills | Status: DC

## 2017-04-26 MED ORDER — SODIUM CHLORIDE 0.9 % IV SOLN: 250.0000 mL | INTRAVENOUS | Status: DC | PRN

## 2017-04-26 MED ORDER — LIDOCAINE HCL (PF) 1 % IJ SOLN
INTRAMUSCULAR | Status: DC | PRN
Start: 2017-04-26 — End: 2017-04-26
  Administered 2017-04-26: 1 mL
  Administered 2017-04-26: 2 mL

## 2017-04-26 MED ORDER — NITROGLYCERIN 1 MG/10 ML FOR IR/CATH LAB
INTRA_ARTERIAL | Status: DC | PRN
  Administered 2017-04-26: 200 ug via INTRACORONARY

## 2017-04-26 SURGICAL SUPPLY — 16 items
CATH 5FR JL3.5 JR4 ANG PIG MP (CATHETERS) ×1 IMPLANT
CATH BALLN WEDGE 5F 110CM (CATHETERS) ×1 IMPLANT
COVER PRB 48X5XTLSCP FOLD TPE (BAG) IMPLANT
COVER PROBE 5X48 (BAG) ×3
DEVICE RAD COMP TR BAND LRG (VASCULAR PRODUCTS) ×1 IMPLANT
GLIDESHEATH SLEND A-KIT 6F 22G (SHEATH) ×1 IMPLANT
GUIDEWIRE .025 260CM (WIRE) ×1 IMPLANT
GUIDEWIRE ANGLED .035X260CM (WIRE) ×1 IMPLANT
GUIDEWIRE INQWIRE 1.5J.035X260 (WIRE) IMPLANT
INQWIRE 1.5J .035X260CM (WIRE) ×3
KIT HEART LEFT (KITS) ×3 IMPLANT
PACK CARDIAC CATHETERIZATION (CUSTOM PROCEDURE TRAY) ×3 IMPLANT
SHEATH GLIDE SLENDER 4/5FR (SHEATH) ×1 IMPLANT
SYR MEDRAD MARK V 150ML (SYRINGE) ×3 IMPLANT
TRANSDUCER W/STOPCOCK (MISCELLANEOUS) ×3 IMPLANT
TUBING CIL FLEX 10 FLL-RA (TUBING) ×3 IMPLANT

## 2017-04-26 NOTE — Brief Op Note (Signed)
Brief Cardiac Catheterization Note  Date: 04/26/2017 Time: 10:42 AM  PATIENT:  Crystal Roberson  51 y.o. female  PRE-OPERATIVE DIAGNOSIS:  Presyncope, shortness of breath, chronic systolic heart failure  POST-OPERATIVE DIAGNOSIS:  Same  PROCEDURE:  Procedure(s): Right/Left Heart Cath and Coronary Angiography (N/A) Ultrasound Guidance For Vascular Access  SURGEON:  Surgeon(s) and Role:    Yvonne Kendall* Gaetano Romberger, MD - Primary  FINDINGS: 1. Patent stent in the mid LAD. 2. 50-60% distal LAD stenosis. 3. Catheter-induced vasospasm at ostium of LAD, which resolved with intracoronary NTG. 4. Normal right heart and pulmonary artery pressures. 5. Upper normal left heart filling pressures. 6. Normal Fick cardiac output. 7. LVEF ~35% with anterior, apical, and apical inferior akinesis.  RECOMMENDATIONS: 1. Decrease furosemide to 20 mg every other day. 2. Continue statin holiday to see if myalgias improve. 3. F/u in clinic in ~2 weeks.  Yvonne Kendallhristopher Henrine Hayter, MD [facility] Pager: [phone]

## 2017-04-26 NOTE — Interval H&P Note (Signed)
History and Physical Interval Note:  04/26/2017 9:32 AM  Crystal Roberson  has presented today for cardiac catheterization, with the diagnosis of shortness of breath, chronic systolic heart failure, and presyncope. The various methods of treatment have been discussed with the patient and family. After consideration of risks, benefits and other options for treatment, the patient has consented to  Procedure(s): Right/Left Heart Cath and Coronary Angiography (N/A) as a surgical intervention .  The patient's history has been reviewed, patient examined, no change in status, stable for surgery.  I have reviewed the patient's chart and labs.  Questions were answered to the patient's satisfaction.    Cath Lab Visit (complete for each Cath Lab visit)  Clinical Evaluation Leading to the Procedure:   ACS: No.  Non-ACS:    Anginal Classification: CCS III  Anti-ischemic medical therapy: Minimal Therapy (1 class of medications)  Non-Invasive Test Results: No non-invasive testing performed (LVEF 35-40%)  Prior CABG: No previous CABG  Crystal Roberson

## 2017-04-26 NOTE — H&P (View-Only) (Signed)
Follow-up Outpatient Visit Date: 04/13/2017  Primary Care Provider: Shona Simpson [address] Laurell Josephs [address] [place] [address]  Chief Complaint: Dizziness  HPI:  Crystal Roberson is a 51 y.o. year-old female with history of coronary artery disease status post anterior STEMI in 08/2016, ischemic cardiomyopathy (LVEF 35-40% by most recent echo), hypertension, migraine headaches, degenerative disc disease with chronic back pain, and depression, who presents for follow-up of dizziness with intermittent hypotension. I last saw Crystal Roberson on 02/09/17, at which time she continued to have multiple episodes of transient lightheadedness per week. She had not passed out and notes that the episodes would pass sitting down the course of 1 to 2 minutes. Home blood pressure checks during these episodes were notable for readings as low as 65/35. She had noted some intermittent weight gain and was using furosemide intermittently. She had not had any chest pain and also noted that her dyspnea was improving. She was seen by Dr. Adriana Simas earlier this month, at which time her blood pressure and weight were both noted to be elevated. He advised her to take furosemide every other day.  Crystal Roberson, Crystal Roberson reports feeling about the same. She continues to have several episodes of lightheadedness and low blood pressure every week. She also notes considerable pain in her legs and arms. There is no clear precipitant but often seems worsen when she first starts to move or gets up in the morning. She has continues to have chronic back pain as well and remains on morphine. She has considerable shortness of breath with mild activity, such as walking across the parking lot. She reports intermittent leg edema, which is not clearly dependent. She has been using furosemide every other day, as directed by Dr. Adriana Simas, but has not had much improvement in her edema or weight loss. She only wears compression stockings sporadically. She denies chest  pain and palpitations. She has 1-2 pillow orthopnea, which is stable.  --------------------------------------------------------------------------------------------------  Cardiovascular History & Procedures: Cardiovascular Problems:  Coronary artery disease status post anterior STEMI (09/23/16)  Ischemic cardiomyopathy with severe LV systolic dysfunction  Risk Factors:  Known coronary artery disease, hypertension, and tobacco use  Cath/PCI:  LHC/PCI (09/23/16): Anterior STEMI with 99% mid LAD stenosis with TIMI 2 flow. Mild to moderate, nonobstructive CAD involving the ostial and distal LAD, ostial and mid LCx, and mid/distal RCA. Severely reduced LV contraction (EF 25-35%) with anterior, apical, and apical inferior akinesis. Moderately elevated left ventricular filling pressure (LVEDP 27 mmHg).  CV Surgery:  None  EP Procedures and Devices:  Event monitor (01/2017): Final report pending preliminary review Crystal Roberson demonstrates normal sinus rhythm with no arrhythmias during triggered events.  Non-Invasive Evaluation(s):  Limited TTE (01/23/17): Dilated left ventricle with normal wall thickness. LVEF 35-40% with apical anterior, apical septal, and apical inferior hypokinesis. Mild to moderate MR. Mild left atrial enlargement. Normal RV size and function.  Transthoracic echocardiogram (11/06/16): LVEF 30-35% with severe hypokinesis of mid/apical septal, anterior, and lateral walls, as well as the apex. Moderate to severe MR. Mild left atrial enlargment. Moderate pulmonary hypertension.  Transthoracic echocardiogram (09/13/16): Normal LV size and contraction (EF 60-65%). Normal diastolic function. Trivial mitral and tricuspid regurgitation. Normal RV size and function.  Recent CV Pertinent Labs: Lab Results  Component Value Date   CHOL 102 03/30/2017   HDL 46.20 03/30/2017   LDLCALC 41 03/30/2017   TRIG 73.0 03/30/2017   CHOLHDL 2 03/30/2017   INR 0.97 09/23/2016   BNP 160.4  (H) 11/26/2016  K 4.2 04/09/2017   K 3.4 (L) 01/04/2012   MG 1.7 11/27/2016   BUN 14 04/09/2017   BUN 12 11/06/2016   BUN 8 01/04/2012   CREATININE 0.73 04/09/2017   CREATININE 0.75 01/04/2012    Past medical and surgical history were reviewed and updated in EPIC.  Outpatient Encounter Prescriptions as of 04/13/2017  Medication Sig  . aspirin 81 MG chewable tablet Chew 1 tablet (81 mg total) by mouth daily.  Marland Kitchen. atorvastatin (LIPITOR) 80 MG tablet Take 1 tablet (80 mg total) by mouth daily at 6 PM.  . buPROPion (WELLBUTRIN XL) 150 MG 24 hr tablet TAKE 1 TABLET BY MOUTH ONCE DAILY  . carvedilol (COREG) 3.125 MG tablet Take 1 tablet (3.125 mg total) by mouth 2 (two) times daily.  . clopidogrel (PLAVIX) 75 MG tablet Take 1 tablet (75 mg total) by mouth daily.  Marland Kitchen. FLUoxetine (PROZAC) 20 MG capsule Take 2 capsules (40 mg total) by mouth daily.  . furosemide (LASIX) 20 MG tablet Take 20 mg by mouth daily as needed (for a 2-3 pound weight gain in 1 day).  Marland Kitchen. lidocaine (LIDODERM) 5 % Place 3 patches onto the skin every 12 (twelve) hours. Remove & Discard patch within 12 hours or as directed by MD  . lisinopril (PRINIVIL,ZESTRIL) 2.5 MG tablet Take 1 tablet (2.5 mg total) by mouth daily.  . magnesium oxide (MAG-OX) 400 (241.3 Mg) MG tablet Take 1 tablet (400 mg total) by mouth 2 (two) times daily.  Marland Kitchen. morphine (Crystal CONTIN) 15 MG 12 hr tablet Take 15 mg by mouth every 12 (twelve) hours.  . nitroGLYCERIN (NITROSTAT) 0.4 MG SL tablet Place 1 tablet (0.4 mg total) under the tongue every 5 (five) minutes as needed for chest pain (CP or SOB).  . potassium chloride SA (K-DUR,KLOR-CON) 20 MEQ tablet Take 1 tablet (20 mEq total) by mouth daily. If takes lasix, take extra dose  . tiZANidine (ZANAFLEX) 2 MG tablet Take 2-4 mg by mouth as directed.  . zolpidem (AMBIEN) 10 MG tablet Take 1 tablet (10 mg total) by mouth at bedtime as needed for sleep.   No facility-administered encounter medications on file as of  04/13/2017.     Allergies: Pregabalin and Codeine  Social History   Social History  . Marital status: Divorced    Spouse name: N/A  . Number of children: N/A  . Years of education: N/A   Occupational History  . Not on file.   Social History Main Topics  . Smoking status: Former Smoker    Packs/day: 0.75    Years: 30.00    Types: Cigarettes    Quit date: 09/23/2016  . Smokeless tobacco: Never Used  . Alcohol use No  . Drug use: No  . Sexual activity: Not Currently   Other Topics Concern  . Not on file   Social History Narrative  . No narrative on file    Family History  Problem Relation Age of Onset  . Hypertension Mother   . Arthritis Mother   . Hyperlipidemia Mother   . Mental illness Mother   . Hypertension Maternal Grandmother   . Heart disease Maternal Grandmother     Review of Systems: A 12-system ROS was performed and was negative, except as noted in the HPI.  --------------------------------------------------------------------------------------------------  Physical Exam: BP 110/72   Pulse 69   Ht 5\' 5"  (1.651 m)   Wt 142 lb 12.8 oz (64.8 kg)   SpO2 99%   BMI 23.76 kg/m  General:  Well-developed woman, seated comfortably in the exam room. She is accompanied by her daughter-in-law. HEENT: No conjunctival pallor or scleral icterus.  Moist mucous membranes.  OP clear. Neck: Supple without lymphadenopathy, thyromegaly, JVD, or HJR.  No carotid bruit. Lungs: Normal work of breathing.  Clear to auscultation bilaterally without wheezes or crackles. Heart: Regular rate and rhythm without murmurs, rubs, or gallops.  Non-displaced PMI. Abd: Bowel sounds present.  Soft, NT/ND without hepatosplenomegaly Ext: No lower extremity edema.  Radial, PT, and DP pulses are 2+ bilaterally. Skin: Warm and dry without rash.  EKG: Normal sinus rhythm with possible right atrial enlargement, septal Q-waves, and non-specific T-wave changes.  Lab Results  Component  Value Date   WBC 5.8 04/09/2017   HGB 12.5 04/09/2017   HCT 36.8 04/09/2017   MCV 86.7 04/09/2017   PLT 160.0 04/09/2017    Lab Results  Component Value Date   NA 140 04/09/2017   K 4.2 04/09/2017   CL 107 04/09/2017   CO2 27 04/09/2017   BUN 14 04/09/2017   CREATININE 0.73 04/09/2017   GLUCOSE 146 (H) 04/09/2017   ALT 30 04/09/2017    Lab Results  Component Value Date   CHOL 102 03/30/2017   HDL 46.20 03/30/2017   LDLCALC 41 03/30/2017   TRIG 73.0 03/30/2017   CHOLHDL 2 03/30/2017   --------------------------------------------------------------------------------------------------  ASSESSMENT AND PLAN: Chronic systolic heart failure secondary to ischemic cardiomyopathy with shortness of breath Crystal Roberson appears euvolemic but is quite functionally limited with NYHA class III symptoms. Her weight is down slightly from her prior visit with Dr. Adriana Simasook, though she continues to report orthopnea and intermittent leg edema. Wew ill continue her current medication regimen; I am hesitant to escalate furosemide and her evidence-based HF medications due to her intermittent hypotension. We have agreed to perform left and right heart catheterization to better understand her hemodynamics as well as to reassess her CAD. I have reviewed the risks, indications, and alternatives to cardiac catheterization, possible angioplasty, and stenting with the patient. Risks include but are not limited to bleeding, infection, vascular injury, stroke, myocardial infection, arrhythmia, kidney injury, radiation-related injury in the case of prolonged fluoroscopy use, emergency cardiac surgery, and death. The patient understands the risks of serious complication is 1-2 in 1000 with diagnostic cardiac cath and 1-2% or less with angioplasty/stenting. Crystal Roberson reports being scheduled for labs with Dr. Adriana Simasook next week; I will see if he can check a CBC and INR at that time.  Coronary artery disease without angina No  chest pain. Continue current medication regimen, including aspirin and ticagrelor. Due to diffuse myalgias, we will proceed with statin holiday to see if her symptoms improve.  Hyperlipidemia LDL was at goal earlier this month. However, given severe myalgias, we will hold atorvastatin for a month to see if her pain improves. If so, we will consider switching to low-dose rosuvastatin with careful uptitration.  Hypertension Blood pressure is normal Crystal Roberson. No medication changes at this time.  Follow-up: To be determined based on cath results.  Yvonne Kendallhristopher Briyah Wheelwright, MD 04/13/2017 7:56 AM

## 2017-04-26 NOTE — Discharge Instructions (Signed)

## 2017-04-27 ENCOUNTER — Telehealth: Payer: Self-pay | Admitting: Internal Medicine

## 2017-04-27 ENCOUNTER — Encounter (HOSPITAL_COMMUNITY): Payer: Self-pay | Admitting: Internal Medicine

## 2017-04-27 NOTE — Telephone Encounter (Signed)
Patient calling, states that she is returning your call.  °

## 2017-04-27 NOTE — Telephone Encounter (Signed)
Pt aware I have scheduled appt for her with Dr End 05/17/17 at 11:40AM.

## 2017-05-07 ENCOUNTER — Ambulatory Visit: Payer: Self-pay | Admitting: Family Medicine

## 2017-05-07 ENCOUNTER — Ambulatory Visit (INDEPENDENT_AMBULATORY_CARE_PROVIDER_SITE_OTHER): Payer: Self-pay | Admitting: Family Medicine

## 2017-05-07 ENCOUNTER — Encounter: Payer: Self-pay | Admitting: Family Medicine

## 2017-05-07 VITALS — BP 184/96 | HR 80 | Temp 98.4°F | Resp 16 | Wt 147.2 lb

## 2017-05-07 DIAGNOSIS — M2550 Pain in unspecified joint: Secondary | ICD-10-CM

## 2017-05-07 DIAGNOSIS — I10 Essential (primary) hypertension: Secondary | ICD-10-CM

## 2017-05-07 DIAGNOSIS — I5022 Chronic systolic (congestive) heart failure: Secondary | ICD-10-CM

## 2017-05-07 DIAGNOSIS — I2510 Atherosclerotic heart disease of native coronary artery without angina pectoris: Secondary | ICD-10-CM

## 2017-05-07 DIAGNOSIS — R55 Syncope and collapse: Secondary | ICD-10-CM

## 2017-05-07 LAB — SEDIMENTATION RATE: Sed Rate: 4 mm/hr (ref 0–30)

## 2017-05-07 NOTE — Assessment & Plan Note (Signed)
Appears be related to hypotension. Sending to EP for further evaluation.

## 2017-05-07 NOTE — Progress Notes (Signed)
Subjective:  Patient ID: Crystal Roberson, female    DOB: [birth year]  Age: 51 y.o. MRN: [id number]  CC: Follow up   HPI:  51 year old with chronic pain, CAD s/p PCI, chronic systolic CHF, syncope, HTN, HLD presents for follow up.  CAD  Recent catheterization on 8/2 revealed mild to moderate nonobstructive coronary artery disease and patent LAD stent.  No reports of chest pain.  CHF  Ejection fraction on catheterization was 30-35%. She continues to have very labile blood pressures and lower extremity edema. Also has significant SOB.  Currently on Lisinopril, Lasix, Corege.  HTN  BP markedly elevated today. However, she has very labile blood pressures. She is currently on carvedilol, lisinopril, Lasix.  Syncope   Still has intermittent hypotension and reported syncopal episodes.   Has had negative cardiac monitoring.  Arthralgia  Patient reports that she has chronic pain. She has recently developed diffuse whole-body pain. She reports significant pain and swelling of her hands and fingers. She states that this is new.  Worse in the morning.  She states it is different from her baseline chronic pain.  She is concerned that she may have an underlying rheumatologic cause.  She is worried/concerned about her ongoing medical problems and the lack of answers regarding why she continues to have sick with episodes and labile blood pressure. She feels like her pain may be contracting.  Social Hx   Social History   Social History  . Marital status: Divorced    Spouse name: N/A  . Number of children: N/A  . Years of education: N/A   Social History Main Topics  . Smoking status: Former Smoker    Packs/day: 0.75    Years: 30.00    Types: Cigarettes    Quit date: 09/23/2016  . Smokeless tobacco: Never Used  . Alcohol use No  . Drug use: No  . Sexual activity: Not Currently   Other Topics Concern  . None   Social History Narrative  . None    Review of Systems    Respiratory: Positive for shortness of breath.   Cardiovascular: Positive for leg swelling.  Musculoskeletal: Positive for arthralgias.   Objective:  BP (!) 184/96 (BP Location: Left Arm, Patient Position: Sitting, Cuff Size: Normal)   Pulse 80   Temp 98.4 F (36.9 C) (Oral)   Resp 16   Wt 147 lb 4 oz (66.8 kg)   SpO2 99%   BMI 24.50 kg/m   BP/Weight 05/07/2017 04/26/2017 8/53/7342  Systolic BP 876 811 572  Diastolic BP 96 67 72  Wt. (Lbs) 147.25 143 142.8  BMI 24.5 23.8 23.76    Physical Exam  Constitutional: She is oriented to person, place, and time. She appears well-developed. No distress.  Cardiovascular: Normal rate and regular rhythm.   Trace lower extremity edema bilaterally.  Pulmonary/Chest: Effort normal and breath sounds normal. She has no wheezes. She has no rales.  Musculoskeletal:  No apparent synovitis or joint swelling.  Neurological: She is alert and oriented to person, place, and time.  Psychiatric:  Flat affect. Depressed mood.  Vitals reviewed.   Lab Results  Component Value Date   WBC 5.9 04/16/2017   HGB 12.2 04/16/2017   HCT 37.3 04/16/2017   PLT 186 04/16/2017   GLUCOSE 82 04/16/2017   CHOL 102 03/30/2017   TRIG 73.0 03/30/2017   HDL 46.20 03/30/2017   LDLCALC 41 03/30/2017   ALT 30 04/09/2017   AST 32 04/09/2017   NA 141  04/16/2017   K 4.0 04/16/2017   CL 108 04/16/2017   CREATININE 0.74 04/16/2017   BUN 14 04/16/2017   CO2 27 04/16/2017   TSH 1.804 09/13/2016   INR 1.0 04/16/2017   HGBA1C 5.4 04/16/2017    Assessment & Plan:   Problem List Items Addressed This Visit    Essential hypertension    BP elevated today. Given her highly labile blood pressures, I am not making any medication changes. Sending to EP.      Chronic systolic heart failure (HCC)    Given persistent low ejection fraction and hypertension/syncope, referring to EP.      Relevant Orders   Ambulatory referral to Cardiac Electrophysiology   Coronary artery  disease - Primary    Recent catheterization revealed mild to moderate nonobstructive coronary artery disease. She has no chest pain currently. She will follow up closely with cardiology.      Syncope    Appears be related to hypotension. Sending to EP for further evaluation.      Relevant Orders   Ambulatory referral to Cardiac Electrophysiology   Arthralgia    New problem. No evidence of synovitis today.  Labs today regarding possible rheumatologic cause.      Relevant Orders   Cyclic citrul peptide antibody, IgG   Sed Rate (ESR)   Antinuclear Antib (ANA)   Rheumatoid Factor     Follow-up: PRN  [place]

## 2017-05-07 NOTE — Assessment & Plan Note (Signed)
Given persistent low ejection fraction and hypertension/syncope, referring to EP.

## 2017-05-07 NOTE — Assessment & Plan Note (Signed)
Recent catheterization revealed mild to moderate nonobstructive coronary artery disease. She has no chest pain currently. She will follow up closely with cardiology.

## 2017-05-07 NOTE — Assessment & Plan Note (Signed)
BP elevated today. Given her highly labile blood pressures, I am not making any medication changes. Sending to EP.

## 2017-05-07 NOTE — Patient Instructions (Signed)
We will arrange the referral.  Be sure to follow up with End. I will talk to him.  Take care  Dr. Adriana Simasook

## 2017-05-07 NOTE — Assessment & Plan Note (Signed)
New problem. No evidence of synovitis today.  Labs today regarding possible rheumatologic cause.

## 2017-05-08 LAB — ANA: Anti Nuclear Antibody(ANA): NEGATIVE

## 2017-05-08 LAB — RHEUMATOID FACTOR

## 2017-05-08 LAB — CYCLIC CITRUL PEPTIDE ANTIBODY, IGG: Cyclic Citrullin Peptide Ab: <16 Units

## 2017-05-17 ENCOUNTER — Encounter: Payer: Self-pay | Admitting: Internal Medicine

## 2017-05-17 ENCOUNTER — Ambulatory Visit (INDEPENDENT_AMBULATORY_CARE_PROVIDER_SITE_OTHER): Payer: Self-pay | Admitting: Internal Medicine

## 2017-05-17 ENCOUNTER — Encounter (INDEPENDENT_AMBULATORY_CARE_PROVIDER_SITE_OTHER): Payer: Self-pay

## 2017-05-17 VITALS — BP 162/98 | HR 82 | Ht 65.0 in | Wt 145.0 lb

## 2017-05-17 DIAGNOSIS — I2510 Atherosclerotic heart disease of native coronary artery without angina pectoris: Secondary | ICD-10-CM

## 2017-05-17 DIAGNOSIS — I5022 Chronic systolic (congestive) heart failure: Secondary | ICD-10-CM

## 2017-05-17 DIAGNOSIS — E785 Hyperlipidemia, unspecified: Secondary | ICD-10-CM

## 2017-05-17 DIAGNOSIS — I255 Ischemic cardiomyopathy: Secondary | ICD-10-CM

## 2017-05-17 DIAGNOSIS — R55 Syncope and collapse: Secondary | ICD-10-CM

## 2017-05-17 DIAGNOSIS — I10 Essential (primary) hypertension: Secondary | ICD-10-CM

## 2017-05-17 MED ORDER — LISINOPRIL 2.5 MG PO TABS: 2.5000 mg | ORAL_TABLET | Freq: Two times a day (BID) | ORAL | 1 refills | Status: DC

## 2017-05-17 NOTE — Patient Instructions (Addendum)
Medication Instructions:  Increase lisinopril to 2.5 mg two times a day  Labwork: Your physician recommends that you return for lab work in: about 2 weeks-this can be done in the [facility]   Testing/Procedures: none  Follow-Up: You have been referred to Dr Elberta Fortis for syncope.  Your physician recommends that you schedule a follow-up appointment in: 6 weeks with Dr End in the [facility]. This is scheduled for June 27, 2017 at 1:20 PM        If you need a refill on your cardiac medications before your next appointment, please call your pharmacy. Marland Kitchen

## 2017-05-17 NOTE — Progress Notes (Signed)
Follow-up Outpatient Visit Date: 05/17/2017  Primary Care Provider: Shona Simpson [address] Laurell Josephs [address] [place] [address]  Chief Complaint: Dizziness  HPI:  Ms. Crystal Roberson is a 51 y.o. year-old female with history of coronary artery disease status post anterior STEMI in 08/2016, ischemic cardiomyopathy (LVEF 35-40% by most recent echo), hypertension, migraine headaches, degenerative disc disease with chronic back pain, and depression, who presents for follow-up of coronary artery disease and ischemic cardiomyopathy complicated by paroxysmal hypotension. I last saw Ms. Crystal Roberson in July, which time we decided to proceed with left and right heart catheterization to reassess her CAD and better understand her hemodynamics. This revealed patent mid LAD stent with moderate, nonobstructive CAD. LVEF was notable for large anterior and apical wall motion abnormality with an EF of 30-35%. Left and right heart filling pressures were upper normal. Fick cardiac output was low normal.  Today, Ms. Crystal Roberson reports that she feels about the same. She continues to have episodic lightheadedness with accompanying dry mouth. She thinks that she may have passed out briefly during one episode about 2 weeks ago. She denies palpitations and chest pain. Exertional dyspnea and fatigue are stable. She has intermittent leg edema, not significantly different compared with before deescalation of furosemide. Blood pressure continues to fluctuate and is often highest when she first wakes up in the morning. She remains compliant with carvedilol and lisinopril, as well as DAPT. Ms. Crystal Roberson has been referred to EP by Dr. Adriana Simas but has not been contacted about an appointment.  --------------------------------------------------------------------------------------------------  Cardiovascular History & Procedures: Cardiovascular Problems:  Coronary artery disease status post anterior STEMI (09/23/16)  Ischemic cardiomyopathy with  severe LV systolic dysfunction  Risk Factors:  Known coronary artery disease, hypertension, and tobacco use  Cath/PCI:  LHC/RHC (04/26/17): LMCA normal. LAD with ostial vasospasm improved to 20% with intracoronary nitroglycerin. Patent stent in mid LAD. Distal LAD shows 50% stenosis, unchanged. There are 20% ostial and 30% mid LCx lesions. Sequential 40% and 20% mid and distal RCA stenoses present. LVEDP 15-18 mmHg. LVEF 30-35% with anterior and apical akinesis. RA 6, RV 32/5, PA 32/12 (19), PCWP 17. AO sat 93%. PA sat 64%. Fick CO/CI 4.8/2.8.  LHC/PCI (09/23/16): Anterior STEMI with 99% mid LAD stenosis with TIMI 2 flow. Mild to moderate, nonobstructive CAD involving the ostial and distal LAD, ostial and mid LCx, and mid/distal RCA. Severely reduced LV contraction (EF 25-35%) with anterior, apical, and apical inferior akinesis. Moderately elevated left ventricular filling pressure (LVEDP 27 mmHg).  CV Surgery:  None  EP Procedures and Devices:  Event monitor (01/2017): Final report pending preliminary review today demonstrates normal sinus rhythm with no arrhythmias during triggered events.  Non-Invasive Evaluation(s):  Limited TTE (01/23/17): Dilated left ventricle with normal wall thickness. LVEF 35-40% with apical anterior, apical septal, and apical inferior hypokinesis. Mild to moderate MR. Mild left atrial enlargement. Normal RV size and function.  Transthoracic echocardiogram (11/06/16): LVEF 30-35% with severe hypokinesis of mid/apical septal, anterior, and lateral walls, as well as the apex. Moderate to severe MR. Mild left atrial enlargment. Moderate pulmonary hypertension.  Transthoracic echocardiogram (09/13/16): Normal LV size and contraction (EF 60-65%). Normal diastolic function. Trivial mitral and tricuspid regurgitation. Normal RV size and function.  Recent CV Pertinent Labs: Lab Results  Component Value Date   CHOL 102 03/30/2017   HDL 46.20 03/30/2017   LDLCALC 41  03/30/2017   TRIG 73.0 03/30/2017   CHOLHDL 2 03/30/2017   INR 1.0 04/16/2017   BNP 160.4 (  H) 11/26/2016   K 4.0 04/16/2017   K 3.4 (L) 01/04/2012   MG 1.7 11/27/2016   BUN 14 04/16/2017   BUN 12 11/06/2016   BUN 8 01/04/2012   CREATININE 0.74 04/16/2017    Past medical and surgical history were reviewed and updated in EPIC.  Outpatient Encounter Prescriptions as of 05/17/2017  Medication Sig  . aspirin 81 MG chewable tablet Chew 1 tablet (81 mg total) by mouth daily.  Marland Kitchen buPROPion (WELLBUTRIN XL) 150 MG 24 hr tablet TAKE 1 TABLET BY MOUTH ONCE DAILY  . carvedilol (COREG) 3.125 MG tablet Take 1 tablet (3.125 mg total) by mouth 2 (two) times daily.  . clopidogrel (PLAVIX) 75 MG tablet Take 1 tablet (75 mg total) by mouth daily.  Marland Kitchen FLUoxetine (PROZAC) 20 MG capsule Take 2 capsules (40 mg total) by mouth daily.  . furosemide (LASIX) 20 MG tablet Take 1 tablet (20 mg total) by mouth every other day.  . lidocaine (LIDODERM) 5 % Place 2-3 patches onto the skin every 12 (twelve) hours as needed (pain). Remove & Discard patch within 12 hours or as directed by MD   . lisinopril (PRINIVIL,ZESTRIL) 2.5 MG tablet Take 1 tablet (2.5 mg total) by mouth daily.  . magnesium oxide (MAG-OX) 400 (241.3 Mg) MG tablet Take 1 tablet (400 mg total) by mouth 2 (two) times daily.  Marland Kitchen morphine (MS CONTIN) 15 MG 12 hr tablet Take 15 mg by mouth every 8 (eight) hours.   . nitroGLYCERIN (NITROSTAT) 0.4 MG SL tablet Place 1 tablet (0.4 mg total) under the tongue every 5 (five) minutes as needed for chest pain (CP or SOB).  . potassium chloride SA (K-DUR,KLOR-CON) 20 MEQ tablet Take 1 tablet (20 mEq total) by mouth daily.  Marland Kitchen tiZANidine (ZANAFLEX) 4 MG tablet Take 4-8 mg by mouth daily as needed for muscle spasms.  Marland Kitchen zolpidem (AMBIEN) 10 MG tablet Take 1 tablet (10 mg total) by mouth at bedtime as needed for sleep.   No facility-administered encounter medications on file as of 05/17/2017.     Allergies: Pregabalin  and Codeine  Social History   Social History  . Marital status: Divorced    Spouse name: N/A  . Number of children: N/A  . Years of education: N/A   Occupational History  . Not on file.   Social History Main Topics  . Smoking status: Former Smoker    Packs/day: 0.75    Years: 30.00    Types: Cigarettes    Quit date: 09/23/2016  . Smokeless tobacco: Never Used  . Alcohol use No  . Drug use: No  . Sexual activity: Not Currently   Other Topics Concern  . Not on file   Social History Narrative  . No narrative on file    Family History  Problem Relation Age of Onset  . Hypertension Mother   . Arthritis Mother   . Hyperlipidemia Mother   . Mental illness Mother   . Hypertension Maternal Grandmother   . Heart disease Maternal Grandmother     Review of Systems: Chronic back and leg pain is unchanged. Otherwise,a 12-system ROS was performed and was negative, except as noted in the HPI.  --------------------------------------------------------------------------------------------------  Physical Exam: BP (!) 162/98   Pulse 82   Ht 5\' 5"  (1.651 m)   Wt 145 lb (65.8 kg)   SpO2 97%   BMI 24.13 kg/m   General:  Well-developed, well-nourished woman, seated in the exam room. She appears uncomfortable when moving around  due to back pain. HEENT: No conjunctival pallor or scleral icterus.  Moist mucous membranes.  OP clear. Neck: Supple without lymphadenopathy, thyromegaly, JVD, or HJR. Lungs: Normal work of breathing.  Clear to auscultation bilaterally without wheezes or crackles. Heart: Regular rate and rhythm without murmurs, rubs, or gallops.  Non-displaced PMI. Abd: Bowel sounds present.  Soft, NT/ND without hepatosplenomegaly Ext: No lower extremity edema.  Radial, PT, and DP pulses are 2+ bilaterally. Right radial and antecubital cath sites are well-healed. Skin: Warm and dry without rash.   Lab Results  Component Value Date   WBC 5.9 04/16/2017   HGB 12.2  04/16/2017   HCT 37.3 04/16/2017   MCV 87.1 04/16/2017   PLT 186 04/16/2017    Lab Results  Component Value Date   NA 141 04/16/2017   K 4.0 04/16/2017   CL 108 04/16/2017   CO2 27 04/16/2017   BUN 14 04/16/2017   CREATININE 0.74 04/16/2017   GLUCOSE 82 04/16/2017   ALT 30 04/09/2017    Lab Results  Component Value Date   CHOL 102 03/30/2017   HDL 46.20 03/30/2017   LDLCALC 41 03/30/2017   TRIG 73.0 03/30/2017   CHOLHDL 2 03/30/2017   --------------------------------------------------------------------------------------------------  ASSESSMENT AND PLAN: Chronic systolic heart failure secondary to ischemic cardiomyopathy Ms. Crystal Roberson appears euvolemic today with stable weight. She has NYHA class III symptoms, though activity is largely limited by chronic back pain. We have agreed to increase lisinopril to 2.5 mg BID; despite significant blood pressure elevation, I am hesitant to increase her medications more aggressively due to episodic lightheadedness. If symptoms persist and we are unable to escalate evidence-based heart failure therapy, we will consider referral to the advanced heart failure clinic.  Coronary artery disease without angina No chest pain; dyspnea is stable. Recent cath showed patent LAD stent and stable mild to moderate CAD. We will continue with DAPT through at least the [place] of December.  Syncope Etiology remains unclear. I remain most concerned about vasovagal mechanism, though in the setting of moderate to severe LV dysfunction, ventricular arrhythmias remain a concern. Of note, symptoms were present (albeit less frequent) prior to STEMI in December. In addition, event monitor did not show any arrhythmias (including when the patient was symptomatic). I will refer her to the next available EP consultation for further workup. Additionally, given LVEF is 30-35% by left ventriculogram (previously 35-40% by echo), ICD implantation for primary prevention will need to  be discussed. I have encouraged Ms. Crystal Roberson to wear compression stockings and tight clothing, given concern for vasovagal mechanism.  Hypertension Blood pressure elevated today without orthostatic hypotension. As above, we will increase lisinopril to 2.5 mg BID. We will repeat a BMP in ~2 weeks to ensure stable renal function and potassium.  Hyperlipidemia Statin currently being held due to myalgias, though pain has not improved significantly with statin holiday. We will readdress restarting statin at our follow-up visit.  Follow-up: Return to clinic in 6 weeks in [place].  Yvonne Kendall, MD 05/17/2017 12:05 PM

## 2017-05-22 ENCOUNTER — Encounter: Payer: Self-pay | Admitting: Cardiology

## 2017-05-22 ENCOUNTER — Encounter: Payer: Self-pay | Admitting: *Deleted

## 2017-05-22 ENCOUNTER — Ambulatory Visit (INDEPENDENT_AMBULATORY_CARE_PROVIDER_SITE_OTHER): Payer: Self-pay | Admitting: Cardiology

## 2017-05-22 ENCOUNTER — Other Ambulatory Visit: Payer: Self-pay | Admitting: Cardiology

## 2017-05-22 VITALS — BP 146/92 | HR 75 | Ht 65.0 in | Wt 144.8 lb

## 2017-05-22 DIAGNOSIS — I10 Essential (primary) hypertension: Secondary | ICD-10-CM

## 2017-05-22 DIAGNOSIS — I2510 Atherosclerotic heart disease of native coronary artery without angina pectoris: Secondary | ICD-10-CM

## 2017-05-22 DIAGNOSIS — R55 Syncope and collapse: Secondary | ICD-10-CM

## 2017-05-22 DIAGNOSIS — I255 Ischemic cardiomyopathy: Secondary | ICD-10-CM

## 2017-05-22 DIAGNOSIS — I5022 Chronic systolic (congestive) heart failure: Secondary | ICD-10-CM

## 2017-05-22 DIAGNOSIS — E785 Hyperlipidemia, unspecified: Secondary | ICD-10-CM

## 2017-05-22 DIAGNOSIS — Z01812 Encounter for preprocedural laboratory examination: Secondary | ICD-10-CM

## 2017-05-22 LAB — CBC WITH DIFFERENTIAL/PLATELET
BASOS ABS: 0 10*3/uL (ref 0.0–0.2)
Basos: 1 %
EOS (ABSOLUTE): 0.1 10*3/uL (ref 0.0–0.4)
Eos: 1 %
HEMOGLOBIN: 12.8 g/dL (ref 11.1–15.9)
Hematocrit: 38.8 % (ref 34.0–46.6)
Immature Grans (Abs): 0 10*3/uL (ref 0.0–0.1)
Immature Granulocytes: 0 %
LYMPHS ABS: 2.1 10*3/uL (ref 0.7–3.1)
Lymphs: 37 %
MCH: 28 pg (ref 26.6–33.0)
MCHC: 33 g/dL (ref 31.5–35.7)
MCV: 85 fL (ref 79–97)
MONOCYTES: 8 %
MONOS ABS: 0.5 10*3/uL (ref 0.1–0.9)
NEUTROS ABS: 3 10*3/uL (ref 1.4–7.0)
Neutrophils: 53 %
PLATELETS: 191 10*3/uL (ref 150–379)
RBC: 4.57 x10E6/uL (ref 3.77–5.28)
RDW: 13.5 % (ref 12.3–15.4)
WBC: 5.6 10*3/uL (ref 3.4–10.8)

## 2017-05-22 LAB — BASIC METABOLIC PANEL
BUN / CREAT RATIO: 21 (ref 9–23)
BUN: 16 mg/dL (ref 6–24)
CO2: 23 mmol/L (ref 20–29)
CREATININE: 0.77 mg/dL (ref 0.57–1.00)
Calcium: 9.4 mg/dL (ref 8.7–10.2)
Chloride: 102 mmol/L (ref 96–106)
GFR calc Af Amer: 102 mL/min/{1.73_m2} (ref >59)
GFR calc non Af Amer: 88 mL/min/{1.73_m2} (ref >59)
GLUCOSE: 108 mg/dL — AB (ref 65–99)
Potassium: 4.2 mmol/L (ref 3.5–5.2)
SODIUM: 140 mmol/L (ref 134–144)

## 2017-05-22 NOTE — Progress Notes (Signed)
Electrophysiology Office Note   Date:  05/22/2017   ID:  Crystal Roberson, DOB 1966/03/18, MRN [id number]  PCP:  Tommie Sams, DO  Cardiologist:  End Primary Electrophysiologist:  Grason Brailsford Jorja Loa, MD    Chief Complaint  Patient presents with  . Advice Only    Syncope/CHF     History of Present Illness: Crystal Roberson is a 51 y.o. female who is being seen today for the evaluation of chronic systolic heart failure at the request of Tommie Sams, DO. Presenting today for electrophysiology evaluation. She has a history of coronary artery disease status post STEMI in 2017, ischemic myopathy with an EF 35-40% by most recent echo, hypertension, migraines, degenerative disc disease, and depression. She had a right and left heart catheterization to reassess coronary disease in July 2018. She was found to have nonobstructive coronary disease with an EF of 30-35% and normal filling pressures. She has episodic lightheadedness and dry mouth. She did have an episode of syncope 2 weeks ago. No palpitations or chest pain. Exertional dyspnea and fatigue which have been stable.    Today, she denies symptoms of palpitations, chest pain, shortness of breath, orthopnea, PND, lower extremity edema, claudication, dizziness, presyncope, syncope, bleeding, or neurologic sequela. The patient is tolerating medications without difficulties. She has multiple complaints today including back pain, leg pain, and fatigue. This pain is chronic for her.   Past Medical History:  Diagnosis Date  . Allergy   . Chicken pox   . Chronic back pain   . DDD (degenerative disc disease), lumbar   . Depression   . Heart attack (HCC)   . Hypertension   . Migraines   . Syncope 09/12/2016   Past Surgical History:  Procedure Laterality Date  . CARDIAC CATHETERIZATION N/A 09/23/2016   Procedure: Left Heart Cath and Coronary Angiography;  Surgeon: Yvonne Kendall, MD;  Location: [facility] INVASIVE CV LAB;  Service: Cardiovascular;   Laterality: N/A;  . CARDIAC CATHETERIZATION N/A 09/23/2016   Procedure: Coronary Stent Intervention;  Surgeon: Yvonne Kendall, MD;  Location: [facility] INVASIVE CV LAB;  Service: Cardiovascular;  Laterality: N/A;  . CERVICAL DISC SURGERY    . RIGHT/LEFT HEART CATH AND CORONARY ANGIOGRAPHY N/A 04/26/2017   Procedure: Right/Left Heart Cath and Coronary Angiography;  Surgeon: Yvonne Kendall, MD;  Location: [facility] INVASIVE CV LAB;  Service: Cardiovascular;  Laterality: N/A;  . TUBAL LIGATION    . ULTRASOUND GUIDANCE FOR VASCULAR ACCESS  04/26/2017   Procedure: Ultrasound Guidance For Vascular Access;  Surgeon: Yvonne Kendall, MD;  Location: [facility] INVASIVE CV LAB;  Service: Cardiovascular;;     Current Outpatient Prescriptions  Medication Sig Dispense Refill  . aspirin 81 MG chewable tablet Chew 1 tablet (81 mg total) by mouth daily. 30 tablet 11  . buPROPion (WELLBUTRIN XL) 150 MG 24 hr tablet TAKE 1 TABLET BY MOUTH ONCE DAILY 90 tablet 1  . carvedilol (COREG) 3.125 MG tablet Take 1 tablet (3.125 mg total) by mouth 2 (two) times daily. 180 tablet 3  . clopidogrel (PLAVIX) 75 MG tablet Take 1 tablet (75 mg total) by mouth daily. 30 tablet 6  . FLUoxetine (PROZAC) 20 MG capsule Take 2 capsules (40 mg total) by mouth daily. 180 capsule 1  . furosemide (LASIX) 20 MG tablet Take 1 tablet (20 mg total) by mouth every other day. 30 tablet 5  . lidocaine (LIDODERM) 5 % Place 2-3 patches onto the skin every 12 (twelve) hours as needed (pain). Remove & Discard patch  within 12 hours or as directed by MD     . lisinopril (PRINIVIL,ZESTRIL) 2.5 MG tablet Take 1 tablet (2.5 mg total) by mouth 2 (two) times daily. 180 tablet 1  . magnesium oxide (MAG-OX) 400 (241.3 Mg) MG tablet Take 1 tablet (400 mg total) by mouth 2 (two) times daily. 60 tablet 6  . morphine (MS CONTIN) 15 MG 12 hr tablet Take 15 mg by mouth every 8 (eight) hours.     . nitroGLYCERIN (NITROSTAT) 0.4 MG SL tablet Place 1 tablet (0.4 mg total) under the  tongue every 5 (five) minutes as needed for chest pain (CP or SOB). 25 tablet 12  . potassium chloride SA (K-DUR,KLOR-CON) 20 MEQ tablet Take 1 tablet (20 mEq total) by mouth daily. 30 tablet 11  . tiZANidine (ZANAFLEX) 4 MG tablet Take 4-8 mg by mouth daily as needed for muscle spasms.    Marland Kitchen zolpidem (AMBIEN) 10 MG tablet Take 1 tablet (10 mg total) by mouth at bedtime as needed for sleep. 30 tablet 0   No current facility-administered medications for this visit.     Allergies:   Pregabalin and Codeine   Social History:  The patient  reports that she quit smoking about 7 months ago. Her smoking use included Cigarettes. She has a 22.50 pack-year smoking history. She has never used smokeless tobacco. She reports that she does not drink alcohol or use drugs.   Family History:  The patient's family history includes Arthritis in her mother; Heart disease in her maternal grandmother; Hyperlipidemia in her mother; Hypertension in her maternal grandmother and mother; Mental illness in her mother.    ROS:  Please see the history of present illness.   Otherwise, review of systems is positive for Weight change, fatigue, leg swelling, shortness of breath, leg pain, constipation, anxiety, depression, back pain, muscle pain, joint swelling, dizziness, passing out, easy bruising.   All other systems are reviewed and negative.    PHYSICAL EXAM: VS:  BP (!) 146/92   Pulse 75   Ht 5\' 5"  (1.651 m)   Wt 144 lb 12.8 oz (65.7 kg)   SpO2 98%   BMI 24.10 kg/m  , BMI Body mass index is 24.1 kg/m. GEN: Well nourished, well developed, in no acute distress  HEENT: normal  Neck: no JVD, carotid bruits, or masses Cardiac: RRR; no murmurs, rubs, or gallops,no edema  Respiratory:  clear to auscultation bilaterally, normal work of breathing GI: soft, nontender, nondistended, + BS MS: no deformity or atrophy  Skin: warm and dry Neuro:  Strength and sensation are intact Psych: euthymic mood, full affect  EKG:   EKG is not ordered today. Personal review of the ekg ordered 04/13/17, rate shows SR, rate 66, septal Q waves, RAE  Recent Labs: 09/13/2016: TSH 1.804 11/26/2016: B Natriuretic Peptide 160.4 11/27/2016: Magnesium 1.7 04/09/2017: ALT 30 04/16/2017: BUN 14; Creat 0.74; Hemoglobin 12.2; Platelets 186; Potassium 4.0; Sodium 141    Lipid Panel     Component Value Date/Time   CHOL 102 03/30/2017 1521   TRIG 73.0 03/30/2017 1521   HDL 46.20 03/30/2017 1521   CHOLHDL 2 03/30/2017 1521   VLDL 14.6 03/30/2017 1521   LDLCALC 41 03/30/2017 1521     Wt Readings from Last 3 Encounters:  05/22/17 144 lb 12.8 oz (65.7 kg)  05/17/17 145 lb (65.8 kg)  05/07/17 147 lb 4 oz (66.8 kg)      Other studies Reviewed: Additional studies/ records that were reviewed today include: TTE  01/23/17  Review of the above records today demonstrates:  - Left ventricle: Septal apical and distal inferior and anterio   wall hypokinesis. The cavity size was moderately dilated. Wall   thickness was normal. Systolic function was moderately reduced.   The estimated ejection fraction was in the range of 35% to 40%. - Mitral valve: There was mild to moderate regurgitation. Valve   area by pressure half-time: 2.14 cm^2. - Left atrium: The atrium was mildly dilated. - Atrial septum: No defect or patent foramen ovale was identified.  14 day monitor 02/11/17 - personally reviewed  The patient was monitred for 14 days.  The predominant rhythm was sinus with an average rate of 64 bpm (range 51-107 bpm).  There was no significant ventricular or supraventricular ectopy.  No sustained arrhythmias or prolonged pauses were identified.  Patient reported symptoms of "passed out," "lightheadedness," "dizziness," "shortness of breath" and "tired or fatigued" correspond to sinus rhythm and sinus rhythm with artifact.  Cath 04/26/17 1. Mild to moderate, non-obstructive coronary artery disease, stable since 08/2016. Coronary vasospasm  was noted in the ostial LAD, which resolved with intracoronary nitroglycerin. 2. Widely patent stent in the mid LAD. 3. Moderate to severely reduced LV contraction (LVEF 30-35%) with mid/distal LAD territory akinesis. 4. Upper normal to mildly elevated left heart filling pressures. 5. Normal right heart and pulmonary artery pressures. 6. Normal Fick cardiac output/index.  ASSESSMENT AND PLAN:  1.  Chronic systolic heart failure secondary to ischemic cardiomyopathy: Currently on optimal medical therapy with carvedilol and lisinopril. Does have episodes of low blood pressure and thus this limits titration of medications. Had an episode of syncope, which could be due to ventricular arrhythmias. Discussed the possibility of ICD implant. Risks and benefits were discussed. Risks include bleeding, tamponade, infection, and pneumothorax. She understands these risks and has agreed to the procedure.  2. Coronary artery disease without angina: No current chest pain. Stable dyspnea. Recent cath showed stable coronary artery disease.  3. Hypertension: Pressure has been quite elevated in the past but she is also had issues with hypotension. We'll plan to let primary cardiologist adjust her blood pressure medications.  4. Syncope: At etiology unclear. The setting of LV dysfunction and ischemic cardiomyopathy, this could be due to ventricular arrhythmias. Plan for ICD implantation.  5. Hyperlipidemia: No statins due to myalgias. Plan per primary cardiology.    Current medicines are reviewed at length with the patient today.   The patient does not have concerns regarding her medicines.  The following changes were made today:  none  Labs/ tests ordered today include:  Orders Placed This Encounter  Procedures  . CBC w/Diff  . Basic Metabolic Panel (BMET)     Disposition:   FU with Jazmyn Offner 3 months  Signed, Stacee Earp Jorja Loa, MD  05/22/2017 11:50 AM     [facility] [address] [place] [address] [phone] (office) [phone] (fax)

## 2017-05-22 NOTE — Patient Instructions (Signed)
Medication Instructions:    Your physician recommends that you continue on your current medications as directed. Please refer to the Current Medication list given to you today.  --- If you need a refill on your cardiac medications before your next appointment, please call your pharmacy. ---  Labwork:  Pre procedure lab work today: BMET & CBC with diff  Testing/Procedures: Your physician has recommended that you have a defibrillator inserted. An implantable cardioverter defibrillator (ICD) is a small device that is placed in your chest or, in rare cases, your abdomen. This device uses electrical pulses or shocks to help control life-threatening, irregular heartbeats that could lead the heart to suddenly stop beating (sudden cardiac arrest). Leads are attached to the ICD that goes into your heart. This is done in the hospital and usually requires an overnight stay. Please see the instruction sheet given to you today for more information.  Follow-Up:  Your physician recommends that you schedule a wound check appointment 10-14 days, after your procedure on 05/31/2017, with the device clinic.   Your physician recommends that you schedule a follow up appointment in 3 months, after your procedure on 05/31/2017, with Dr. Elberta Fortis.  Thank you for choosing [facility]!!   Dory Horn, RN [phone]  Any Other Special Instructions Will Be Listed Below (If Applicable).   Cardioverter Defibrillator Implantation An implantable cardioverter defibrillator (ICD) is a small, lightweight, battery-powered device that is placed (implanted) under the skin in the chest or abdomen. Your caregiver may prescribe an ICD if:  You have had an irregular heart rhythm (arrhythmia) that originated in the lower chambers of the heart (ventricles).  Your heart has been damaged by a disease (such as coronary artery disease) or heart condition (such as a heart attack). An ICD consists of a battery that lasts  several years, a small computer called a pulse generator, and wires called leads that go into the heart. It is used to detect and correct two dangerous arrhythmias: a rapid heart rhythm (tachycardia) and an arrhythmia in which the ventricles contract in an uncoordinated way (fibrillation). When an ICD detects tachycardia, it sends an electrical signal to the heart that restores the heartbeat to normal (cardioversion). This signal is usually painless. If cardioversion does not work or if the ICD detects fibrillation, it delivers a small electrical shock to the heart (defibrillation) to restart the heart. The shock may feel like a strong jolt in the chest.ICDs may be programmed to correct other problems. Sometimes, ICDs are programmed to act as another type of implantable device called a pacemaker. Pacemakers are used to treat a slow heartbeat (bradycardia). LET YOUR CAREGIVER KNOW ABOUT:  Any allergies you have.  All medicines you are taking, including vitamins, herbs, eyedrops, and over-the-counter medicines and creams.  Previous problems you or members of your family have had with the use of anesthetics.  Any blood disorders you have had.  Other health problems you have. RISKS AND COMPLICATIONS Generally, the procedure to implant an ICD is safe. However, as with any surgical procedure, complications can occur. Possible complications associated with implanting an ICD include:  Swelling, bleeding, or bruising at the site where the ICD was implanted.  Infection at the site where the ICD was implanted.  A reaction to medicine used during the procedure.  Nerve, heart, or blood vessel damage.  Blood clots. BEFORE THE PROCEDURE  You may need to have blood tests, heart tests, or a chest X-ray done before the day of the procedure.  Ask  your caregiver about changing or stopping your regular medicines.  Make plans to have someone drive you home. You may need to stay in the hospital overnight  after the procedure.  Stop smoking at least 24 hours before the procedure.  Take a bath or shower the night before the procedure. You may need to scrub your chest or abdomen with a special type of soap.  Do not eat or drink before your procedure for as long as directed by your caregiver. Ask if it is okay to take any needed medicine with a small sip of water. PROCEDURE  The procedure to implant an ICD in your chest or abdomen is usually done at a hospital in a room that has a large X-ray machine called a fluoroscope. The machine will be above you during the procedure. It will help your caregiver see your heart during the procedure. Implanting an ICD usually takes 1-3 hours. Before the procedure:   Small monitors will be put on your body. They will be used to check your heart, blood pressure, and oxygen level.  A needle will be put into a vein in your hand or arm. This is called an intravenous (IV) access tube. Fluids and medicine will flow directly into your body through the IV tube.  Your chest or abdomen will be cleaned with a germ-killing (antiseptic) solution. The area may be shaved.  You may be given medicine to help you relax (sedative).  You will be given a medicine called a local anesthetic. This medicine will make the surgical site numb while the ICD is implanted. You will be sleepy but awake during the procedure. After you are numb the procedure will begin. The caregiver will:  Make a small cut (incision). This will make a pocket deep under your skin that will hold the pulse generator.  Guide the leads through a large blood vessel into your heart and attach them to the heart muscles. Depending on the ICD, the leads may go into one ventricle or they may go to both ventricles and into an upper chamber of the heart (atrium).  Test the ICD.  Close the incision with stitches, glue, or staples. AFTER THE PROCEDURE  You may feel pain. Some pain is normal. It may last a few  days.  You may stay in a recovery area until the local anesthetic has worn off. Your blood pressure and pulse will be checked often. You will be taken to a room where your heart will be monitored.  A chest X-ray will be taken. This is done to check that the cardioverter defibrillator is in the right place.  You may stay in the hospital overnight.  A slight bump may be seen over the skin where the ICD was placed. Sometimes, it is possible to feel the ICD under the skin. This is normal.  In the months and years afterward, your caregiver will check the device, the leads, and the battery every few months. Eventually, when the battery is low, the ICD will be replaced.   This information is not intended to replace advice given to you by your health care provider. Make sure you discuss any questions you have with your health care provider.   Document Released: 06/03/2002 Document Revised: 07/02/2013 Document Reviewed: 09/30/2012 Elsevier Interactive Patient Education [employer]

## 2017-05-30 ENCOUNTER — Other Ambulatory Visit (INDEPENDENT_AMBULATORY_CARE_PROVIDER_SITE_OTHER): Payer: Self-pay

## 2017-05-30 DIAGNOSIS — I5022 Chronic systolic (congestive) heart failure: Secondary | ICD-10-CM

## 2017-05-31 ENCOUNTER — Ambulatory Visit (HOSPITAL_COMMUNITY)
Admission: RE | Admit: 2017-05-31 | Discharge: 2017-06-01 | Disposition: A | Discharge status: Home / Self Care | Payer: Self-pay | Source: Ambulatory Visit | Attending: Cardiology | Admitting: Cardiology

## 2017-05-31 ENCOUNTER — Encounter (HOSPITAL_COMMUNITY)
Admission: RE | Disposition: A | Discharge status: Home / Self Care | Payer: Self-pay | Source: Ambulatory Visit | Attending: Cardiology

## 2017-05-31 DIAGNOSIS — I255 Ischemic cardiomyopathy: Secondary | ICD-10-CM | POA: Diagnosis present

## 2017-05-31 DIAGNOSIS — Z7982 Long term (current) use of aspirin: Secondary | ICD-10-CM | POA: Insufficient documentation

## 2017-05-31 DIAGNOSIS — I2510 Atherosclerotic heart disease of native coronary artery without angina pectoris: Secondary | ICD-10-CM | POA: Insufficient documentation

## 2017-05-31 DIAGNOSIS — I252 Old myocardial infarction: Secondary | ICD-10-CM | POA: Insufficient documentation

## 2017-05-31 DIAGNOSIS — G8929 Other chronic pain: Secondary | ICD-10-CM | POA: Insufficient documentation

## 2017-05-31 DIAGNOSIS — G43909 Migraine, unspecified, not intractable, without status migrainosus: Secondary | ICD-10-CM | POA: Insufficient documentation

## 2017-05-31 DIAGNOSIS — Z95818 Presence of other cardiac implants and grafts: Secondary | ICD-10-CM

## 2017-05-31 DIAGNOSIS — I110 Hypertensive heart disease with heart failure: Secondary | ICD-10-CM | POA: Insufficient documentation

## 2017-05-31 DIAGNOSIS — M549 Dorsalgia, unspecified: Secondary | ICD-10-CM | POA: Insufficient documentation

## 2017-05-31 DIAGNOSIS — I5022 Chronic systolic (congestive) heart failure: Secondary | ICD-10-CM | POA: Insufficient documentation

## 2017-05-31 DIAGNOSIS — F329 Major depressive disorder, single episode, unspecified: Secondary | ICD-10-CM | POA: Insufficient documentation

## 2017-05-31 HISTORY — PX: ICD IMPLANT: EP1208

## 2017-05-31 LAB — BASIC METABOLIC PANEL
BUN/Creatinine Ratio: 17 (ref 9–23)
BUN: 15 mg/dL (ref 6–24)
CO2: 26 mmol/L (ref 20–29)
Calcium: 9.1 mg/dL (ref 8.7–10.2)
Chloride: 103 mmol/L (ref 96–106)
Creatinine, Ser: 0.86 mg/dL (ref 0.57–1.00)
GFR calc Af Amer: 89 mL/min/{1.73_m2} (ref >59)
GFR calc non Af Amer: 77 mL/min/{1.73_m2} (ref >59)
GLUCOSE: 112 mg/dL — AB (ref 65–99)
POTASSIUM: 3.9 mmol/L (ref 3.5–5.2)
SODIUM: 141 mmol/L (ref 134–144)

## 2017-05-31 LAB — SURGICAL PCR SCREEN
MRSA, PCR: NEGATIVE
STAPHYLOCOCCUS AUREUS: POSITIVE — AB

## 2017-05-31 SURGERY — ICD IMPLANT
Anesthesia: LOCAL

## 2017-05-31 MED ORDER — SODIUM CHLORIDE 0.9 % IR SOLN
80.0000 mg | Status: AC
  Administered 2017-05-31: 80 mg

## 2017-05-31 MED ORDER — FENTANYL CITRATE (PF) 100 MCG/2ML IJ SOLN
INTRAMUSCULAR | Status: AC
  Filled 2017-05-31: qty 2

## 2017-05-31 MED ORDER — MUPIROCIN 2 % EX OINT
TOPICAL_OINTMENT | CUTANEOUS | Status: AC
  Administered 2017-05-31: 1 via TOPICAL
  Filled 2017-05-31: qty 22

## 2017-05-31 MED ORDER — FUROSEMIDE 20 MG PO TABS
20.0000 mg | ORAL_TABLET | ORAL | Status: DC
  Filled 2017-05-31: qty 1

## 2017-05-31 MED ORDER — MAGNESIUM OXIDE 400 (241.3 MG) MG PO TABS
400.0000 mg | ORAL_TABLET | Freq: Two times a day (BID) | ORAL | Status: DC
  Administered 2017-05-31 – 2017-06-01 (×2): 400 mg via ORAL
  Filled 2017-05-31 (×2): qty 1

## 2017-05-31 MED ORDER — FLUOXETINE HCL 20 MG PO CAPS
40.0000 mg | ORAL_CAPSULE | Freq: Every day | ORAL | Status: DC
  Administered 2017-05-31 – 2017-06-01 (×2): 40 mg via ORAL
  Filled 2017-05-31 (×2): qty 2

## 2017-05-31 MED ORDER — SODIUM CHLORIDE 0.9 % IR SOLN
Status: AC
  Filled 2017-05-31: qty 2

## 2017-05-31 MED ORDER — MUPIROCIN 2 % EX OINT
1.0000 "application " | TOPICAL_OINTMENT | Freq: Once | CUTANEOUS | Status: AC
  Administered 2017-05-31: 1 via TOPICAL

## 2017-05-31 MED ORDER — LIDOCAINE HCL (PF) 1 % IJ SOLN
INTRAMUSCULAR | Status: AC
  Filled 2017-05-31: qty 60

## 2017-05-31 MED ORDER — CEFAZOLIN SODIUM-DEXTROSE 2-4 GM/100ML-% IV SOLN
2.0000 g | INTRAVENOUS | Status: AC
  Administered 2017-05-31: 2 g via INTRAVENOUS

## 2017-05-31 MED ORDER — HEPARIN (PORCINE) IN NACL 2-0.9 UNIT/ML-% IJ SOLN
INTRAMUSCULAR | Status: AC
Start: 2017-05-31 — End: ?
  Filled 2017-05-31: qty 500

## 2017-05-31 MED ORDER — MIDAZOLAM HCL 5 MG/5ML IJ SOLN
INTRAMUSCULAR | Status: DC | PRN
  Administered 2017-05-31 (×2): 1 mg via INTRAVENOUS

## 2017-05-31 MED ORDER — ASPIRIN 81 MG PO CHEW
81.0000 mg | CHEWABLE_TABLET | Freq: Every day | ORAL | Status: DC
  Administered 2017-06-01: 81 mg via ORAL
  Filled 2017-05-31: qty 1

## 2017-05-31 MED ORDER — HEPARIN (PORCINE) IN NACL 2-0.9 UNIT/ML-% IJ SOLN
INTRAMUSCULAR | Status: AC | PRN
  Administered 2017-05-31: 500 mL

## 2017-05-31 MED ORDER — HYDRALAZINE HCL 20 MG/ML IJ SOLN
INTRAMUSCULAR | Status: DC | PRN
  Administered 2017-05-31: 10 mg via INTRAVENOUS

## 2017-05-31 MED ORDER — POTASSIUM CHLORIDE CRYS ER 20 MEQ PO TBCR
20.0000 meq | EXTENDED_RELEASE_TABLET | Freq: Every day | ORAL | Status: DC
  Administered 2017-05-31 – 2017-06-01 (×2): 20 meq via ORAL
  Filled 2017-05-31 (×2): qty 1

## 2017-05-31 MED ORDER — CEFAZOLIN SODIUM-DEXTROSE 2-4 GM/100ML-% IV SOLN
INTRAVENOUS | Status: AC
  Filled 2017-05-31: qty 100

## 2017-05-31 MED ORDER — ONDANSETRON HCL 4 MG/2ML IJ SOLN
4.0000 mg | Freq: Four times a day (QID) | INTRAMUSCULAR | Status: DC | PRN
Start: 2017-05-31 — End: 2017-06-01
  Administered 2017-06-01: 4 mg via INTRAVENOUS
  Filled 2017-05-31: qty 2

## 2017-05-31 MED ORDER — FENTANYL CITRATE (PF) 100 MCG/2ML IJ SOLN
INTRAMUSCULAR | Status: DC | PRN
  Administered 2017-05-31 (×2): 25 ug via INTRAVENOUS

## 2017-05-31 MED ORDER — TIZANIDINE HCL 4 MG PO TABS
4.0000 mg | ORAL_TABLET | Freq: Every day | ORAL | Status: DC | PRN
  Administered 2017-05-31: 8 mg via ORAL
  Administered 2017-06-01: 4 mg via ORAL
  Filled 2017-05-31 (×4): qty 2

## 2017-05-31 MED ORDER — NITROGLYCERIN 0.4 MG SL SUBL: 0.4000 mg | SUBLINGUAL_TABLET | SUBLINGUAL | Status: DC | PRN

## 2017-05-31 MED ORDER — SODIUM CHLORIDE 0.9 % IV SOLN
INTRAVENOUS | Status: DC
  Administered 2017-05-31: 09:00:00 via INTRAVENOUS

## 2017-05-31 MED ORDER — ACETAMINOPHEN 325 MG PO TABS
325.0000 mg | ORAL_TABLET | ORAL | Status: DC | PRN
  Administered 2017-05-31 – 2017-06-01 (×3): 650 mg via ORAL
  Filled 2017-05-31 (×2): qty 2

## 2017-05-31 MED ORDER — BUPROPION HCL ER (XL) 150 MG PO TB24
150.0000 mg | ORAL_TABLET | Freq: Every day | ORAL | Status: DC
  Administered 2017-06-01: 150 mg via ORAL
  Filled 2017-05-31 (×2): qty 1

## 2017-05-31 MED ORDER — CEFAZOLIN SODIUM-DEXTROSE 1-4 GM/50ML-% IV SOLN
1.0000 g | Freq: Four times a day (QID) | INTRAVENOUS | Status: AC
  Administered 2017-05-31 – 2017-06-01 (×2): 1 g via INTRAVENOUS
  Filled 2017-05-31 (×4): qty 50

## 2017-05-31 MED ORDER — LISINOPRIL 2.5 MG PO TABS
2.5000 mg | ORAL_TABLET | Freq: Two times a day (BID) | ORAL | Status: DC
  Administered 2017-05-31: 2.5 mg via ORAL
  Filled 2017-05-31 (×2): qty 1

## 2017-05-31 MED ORDER — ACETAMINOPHEN 325 MG PO TABS
ORAL_TABLET | ORAL | Status: AC
  Filled 2017-05-31: qty 2

## 2017-05-31 MED ORDER — CARVEDILOL 3.125 MG PO TABS
3.1250 mg | ORAL_TABLET | Freq: Two times a day (BID) | ORAL | Status: DC
  Administered 2017-05-31: 3.125 mg via ORAL
  Filled 2017-05-31 (×2): qty 1

## 2017-05-31 MED ORDER — CLOPIDOGREL BISULFATE 75 MG PO TABS
75.0000 mg | ORAL_TABLET | Freq: Every day | ORAL | Status: DC
  Administered 2017-06-01: 75 mg via ORAL
  Filled 2017-05-31: qty 1

## 2017-05-31 MED ORDER — LIDOCAINE HCL (PF) 1 % IJ SOLN
INTRAMUSCULAR | Status: DC | PRN
  Administered 2017-05-31: 45 mL

## 2017-05-31 MED ORDER — ZOLPIDEM TARTRATE 5 MG PO TABS: 5.0000 mg | ORAL_TABLET | Freq: Every evening | ORAL | Status: DC | PRN

## 2017-05-31 MED ORDER — MIDAZOLAM HCL 5 MG/5ML IJ SOLN
INTRAMUSCULAR | Status: AC
  Filled 2017-05-31: qty 5

## 2017-05-31 SURGICAL SUPPLY — 6 items
CABLE SURGICAL S-101-97-12 (CABLE) ×1 IMPLANT
ICD ELLIPSE VR CD1411-36Q (ICD Generator) ×1 IMPLANT
LEAD DURATA 7122Q-65CM (Lead) ×1 IMPLANT
PAD DEFIB LIFELINK (PAD) ×1 IMPLANT
SHEATH CLASSIC 7F (SHEATH) ×1 IMPLANT
TRAY PACEMAKER INSERTION (PACKS) ×1 IMPLANT

## 2017-05-31 NOTE — H&P (Signed)
Crystal Roberson is a 51 y.o. female with a history of ischemic cardiomyopathy. She has an EF of 30-35% on most recent LV gram at catheterization. She presents today for ICD implant. On exam, RRR, no murmurs, lungs clear. Risks and beneftis discussed. Risks include but not limited to bleeding, infection, tamponade, pneumothorax. She understands the risks and has agreed to the procedure.  Jayln Branscom Elberta Fortisamnitz, MD 05/31/2017 10:22 AM  ICD Criteria  Current LVEF:30-35%. Within 12 months prior to implant: Yes   Heart failure history: Yes, Class II  Cardiomyopathy history: Yes, Ischemic Cardiomyopathy - Prior MI.  Atrial Fibrillation/Atrial Flutter: No.  Ventricular tachycardia history: No.  Cardiac arrest history: No.  History of syndromes with risk of sudden death: No.  Previous ICD: No.  Current ICD indication: Primary  PPM indication: No.   Class I or II Bradycardia indication present: No  Beta Blocker therapy for 3 or more months: Yes, prescribed.   Ace Inhibitor/ARB therapy for 3 or more months: No, medical reason.

## 2017-05-31 NOTE — Progress Notes (Signed)
Patient received from OR/PACU. Report received from PoynorAletha, [place]. Patient is snoring, awakened as we moved patient from gurney to bed. Telemtry applied, CCMD notified x 2.

## 2017-06-01 ENCOUNTER — Other Ambulatory Visit: Payer: Self-pay

## 2017-06-01 ENCOUNTER — Encounter (HOSPITAL_COMMUNITY): Payer: Self-pay | Admitting: Cardiology

## 2017-06-01 ENCOUNTER — Ambulatory Visit (HOSPITAL_COMMUNITY): Payer: Self-pay

## 2017-06-01 DIAGNOSIS — I255 Ischemic cardiomyopathy: Secondary | ICD-10-CM

## 2017-06-01 MED ORDER — MORPHINE SULFATE ER 15 MG PO TBCR
15.0000 mg | EXTENDED_RELEASE_TABLET | Freq: Three times a day (TID) | ORAL | Status: DC
  Administered 2017-06-01 (×2): 15 mg via ORAL
  Filled 2017-06-01 (×2): qty 1

## 2017-06-01 MED ORDER — TRAMADOL HCL 50 MG PO TABS
50.0000 mg | ORAL_TABLET | Freq: Four times a day (QID) | ORAL | Status: DC | PRN
Start: 2017-06-01 — End: 2017-06-01
  Administered 2017-06-01: 50 mg via ORAL
  Filled 2017-06-01: qty 1

## 2017-06-01 MED ORDER — OXYCODONE HCL 5 MG PO TABS
5.0000 mg | ORAL_TABLET | ORAL | Status: DC | PRN
  Administered 2017-06-01: 5 mg via ORAL
  Filled 2017-06-01: qty 1

## 2017-06-01 NOTE — Progress Notes (Signed)
Notified MD of pain in left shoulder 9/10, and hypertension 177/109

## 2017-06-01 NOTE — Discharge Instructions (Signed)

## 2017-06-01 NOTE — Progress Notes (Signed)
Discharging to home, verbalized understanding of all written and verbal instructions.

## 2017-06-01 NOTE — Progress Notes (Addendum)
Notified MD of patient c/o back pain 6/10.  Orders received.

## 2017-06-01 NOTE — Progress Notes (Signed)
Left unit via wheelchair, accompanied by nurse and spouse. No signs of distress.

## 2017-06-01 NOTE — Discharge Summary (Signed)
Discharge Summary    Patient ID: Crystal Roberson,  MRN: [id number], DOB/AGE: 06/08/1966 51 y.o.  Admit date: 05/31/2017 Discharge date: 06/01/2017  Primary Care Provider: Tommie Samsook, Jayce G Primary Cardiologist: Dr. Cristal Deerhristopher End Electrophysiologist: Loman BrooklynWill Tyonna Talerico  Discharge Diagnoses    Active Problems:   Ischemic cardiomyopathy   Allergies Allergies  Allergen Reactions  . Pregabalin Shortness Of Breath, Swelling, Palpitations and Other (See Comments)    Other reaction(s): Tachycardia (finding)  . Codeine Nausea And Vomiting    Diagnostic Studies/Procedures    ICD implant 05/31/17  Through the left axillary vein, a [employer], model [id number] (serial number [id number]) right ventricular defibrillator lead were advanced with fluoroscopic visualization into the right atrial appendage and right ventricular apex positions respectively.  The right ventricular lead R-wave measured 11.8 mV with impedance of 543 ohms and a threshold of 0.4 volts at 0.5 milliseconds.     CONCLUSIONS:   1. Ischemic cardiomyopathy with chronic New York Heart Association class II heart failure.   2. Successful ICD implantation.   3. No early apparent complications.  _____________   History of Present Illness     Crystal Roberson is a 51 y.o. year-old female with history of coronary artery disease status post anterior STEMI in 08/2016, ischemic cardiomyopathy (LVEF 35-40% by most recent echo), hypertension, migraine headaches, degenerative disc disease with chronic back pain, and depression, who presented for follow-up of dizziness with intermittent hypotension. She continued to have multiple episodes of transient lightheadedness per week. She had not passed out and notes that the episodes would pass sitting down the course of 1 to 2 minutes. Home blood pressure checks during these episodes were notable for readings as low as 65/35. EP was consulted.   She has a history of coronary artery disease status  post STEMI in 2017, ischemic myopathy with an EF 35-40% by most recent echo, hypertension, migraines, degenerative disc disease, and depression. She had a right and left heart catheterization to reassess coronary disease in July 2018. She was found to have nonobstructive coronary disease with an EF of 30-35% and normal filling pressures. She has episodic lightheadedness and dry mouth. She did have an episode of syncope 2 weeks ago. No palpitations or chest pain. Exertional dyspnea and fatigue which have been stable.  Currently on optimal medical therapy with carvedilol and lisinopril. Does have episodes of low blood pressure and thus this limits titration of medications. Had an episode of syncope, which could be due to ventricular arrhythmias.The decision was made to implant an ICD.   Hospital Course     Consultants: None  ICD was implanted yesterday, 05/31/2017. Crystal Roberson has mod discomfort at site. Site has steri-strips intact without hematoma, bleeding or drainage. No pneumothorax on chest xray. Telemetry shows Sinus rhythm in the 60's-70's. Crystal Roberson is stable for discharge.  Patient has been seen by Dr. Elberta Fortisamnitz today and deemed ready for discharge home. All follow up appointments have been scheduled. Discharge medications are listed below.  _____________  Discharge Vitals Blood pressure 99/66, pulse 77, temperature 98.1 F (36.7 C), temperature source Oral, resp. rate 16, height 5\' 5"  (1.651 m), weight 145 lb (65.8 kg), SpO2 97 %.  Filed Weights   05/31/17 0820  Weight: 145 lb (65.8 kg)   Physical Exam  Constitutional: She appears healthy. No distress.  Neck: Normal range of motion. No JVD present.  Cardiovascular: Normal rate, regular rhythm and normal heart sounds.  Exam reveals no gallop and no friction rub.  No murmur heard. Pulmonary/Chest: Effort normal and breath sounds normal. She has no wheezes. She has no rales.  Abdominal: Soft. Bowel sounds are normal.  Musculoskeletal: She exhibits no  edema.  Left upper chest tender at ICD site, Decreased ROM left arm per instructions  Neurological: She is alert and oriented to person, place, and time.  Skin: Skin is warm and dry.   Labs & Radiologic Studies    CBC No results for input(s): WBC, NEUTROABS, HGB, HCT, MCV, PLT in the last 72 hours. Basic Metabolic Panel  Recent Labs  05/30/17 0814  NA 141  K 3.9  CL 103  CO2 26  GLUCOSE 112*  BUN 15  CREATININE 0.86  CALCIUM 9.1   Liver Function Tests No results for input(s): AST, ALT, ALKPHOS, BILITOT, PROT, ALBUMIN in the last 72 hours. No results for input(s): LIPASE, AMYLASE in the last 72 hours. Cardiac Enzymes No results for input(s): CKTOTAL, CKMB, CKMBINDEX, TROPONINI in the last 72 hours. BNP Invalid input(s): POCBNP D-Dimer No results for input(s): DDIMER in the last 72 hours. Hemoglobin A1C No results for input(s): HGBA1C in the last 72 hours. Fasting Lipid Panel No results for input(s): CHOL, HDL, LDLCALC, TRIG, CHOLHDL, LDLDIRECT in the last 72 hours. Thyroid Function Tests No results for input(s): TSH, T4TOTAL, T3FREE, THYROIDAB in the last 72 hours.  Invalid input(s): FREET3 _____________  Dg Chest 2 View  Result Date: 06/01/2017 CLINICAL DATA:  Pacemaker placement EXAM: CHEST  2 VIEW COMPARISON:  11/26/2016 FINDINGS: Single lead cardiac pacemaker with the lead in satisfactory position. There is no focal parenchymal opacity. There is no pleural effusion or pneumothorax. The heart and mediastinal contours are unremarkable. The osseous structures are unremarkable. IMPRESSION: No active cardiopulmonary disease. Electronically Signed   By: Elige Ko   On: 06/01/2017 08:09   Disposition   Crystal Roberson is being discharged home today in good condition.  Follow-up Plans & Appointments    Follow-up Information    [facility] Office Follow up.   Specialty:  Cardiology Why:  You have an appointment for a wound check of your pacemaker site on September  17th at 10:00. Contact information: [address], [address] [place] [address] [phone]       Regan Lemming, MD Follow up.   Specialty:  Cardiology Why:  On December 20th at 10:30 for follow up of ICD.  Contact information: [address] [place] [address] [phone]        Yvonne Kendall, MD Follow up.   Specialty:  Cardiology Why:  On October 3rd at 1:20 for cardiology follow up.  Contact information: [address] [place] [address] [phone]          Discharge Instructions    Diet - low sodium heart healthy    Complete by:  As directed    Increase activity slowly    Complete by:  As directed      Discharge Medications   Current Discharge Medication List    CONTINUE these medications which have NOT CHANGED   Details  aspirin 81 MG chewable tablet Chew 1 tablet (81 mg total) by mouth daily. Qty: 30 tablet, Refills: 11    buPROPion (WELLBUTRIN XL) 150 MG 24 hr tablet TAKE 1 TABLET BY MOUTH ONCE DAILY Qty: 90 tablet, Refills: 1    carvedilol (COREG) 3.125 MG tablet Take 1 tablet (3.125 mg total) by mouth 2 (two) times daily. Qty: 180 tablet,  Refills: 3    clopidogrel (PLAVIX) 75 MG tablet Take 1 tablet (75 mg total) by mouth daily. Qty: 30 tablet, Refills: 6    FLUoxetine (PROZAC) 20 MG capsule Take 2 capsules (40 mg total) by mouth daily. Qty: 180 capsule, Refills: 1    furosemide (LASIX) 20 MG tablet Take 1 tablet (20 mg total) by mouth every other day. Qty: 30 tablet, Refills: 5    lidocaine (LIDODERM) 5 % Place 2-3 patches onto the skin every 12 (twelve) hours as needed (pain). Remove & Discard patch within 12 hours or as directed by MD     lisinopril (PRINIVIL,ZESTRIL) 2.5 MG tablet Take 1 tablet (2.5 mg total) by mouth 2 (two) times daily. Qty: 180 tablet, Refills: 1   Associated Diagnoses: Chronic systolic heart failure (HCC)    magnesium oxide (MAG-OX) 400 (241.3 Mg) MG  tablet Take 1 tablet (400 mg total) by mouth 2 (two) times daily. Qty: 60 tablet, Refills: 6    morphine (MS CONTIN) 15 MG 12 hr tablet Take 15 mg by mouth every 8 (eight) hours.     potassium chloride SA (K-DUR,KLOR-CON) 20 MEQ tablet Take 1 tablet (20 mEq total) by mouth daily. Qty: 30 tablet, Refills: 11    tiZANidine (ZANAFLEX) 4 MG tablet Take 4-8 mg by mouth daily as needed for muscle spasms.    zolpidem (AMBIEN) 10 MG tablet Take 1 tablet (10 mg total) by mouth at bedtime as needed for sleep. Qty: 30 tablet, Refills: 0    nitroGLYCERIN (NITROSTAT) 0.4 MG SL tablet Place 1 tablet (0.4 mg total) under the tongue every 5 (five) minutes as needed for chest pain (CP or SOB). Qty: 25 tablet, Refills: 12           Outstanding Labs/Studies   None  Duration of Discharge Encounter   Greater than 30 minutes including physician time.  Signed, Berton Bon NP 06/01/2017, 12:13 PM   I have seen and examined this patient with Berton Bon.  Agree with above, note added to reflect my findings.  On exam, RRR, no murmurs, lungs clear. Had ICD implanted for CHF. CXR and interrogation without issue. Plan for discharge today with follow up in device clinic.    Riordan Walle M. Beila Purdie MD 06/01/2017 4:23 PM

## 2017-06-11 ENCOUNTER — Ambulatory Visit (INDEPENDENT_AMBULATORY_CARE_PROVIDER_SITE_OTHER): Payer: Self-pay | Admitting: *Deleted

## 2017-06-11 DIAGNOSIS — I5022 Chronic systolic (congestive) heart failure: Secondary | ICD-10-CM

## 2017-06-11 NOTE — Progress Notes (Signed)
Wound check appointment. Steri-strips removed. Wound without redness or edema. Incision edges approximated, wound well healed. Normal device function. Threshold, sensing, and impedances consistent with implant measurements. Device programmed at 3.5V for extra safety margin until 3 month visit. Histogram distribution appropriate for patient and level of activity. No ventricular arrhythmias noted. Patient educated about wound care, arm mobility, lifting restrictions, shock plan. ROV 09/13/2017 w/ WC .

## 2017-06-27 ENCOUNTER — Ambulatory Visit (INDEPENDENT_AMBULATORY_CARE_PROVIDER_SITE_OTHER): Payer: Self-pay | Admitting: Internal Medicine

## 2017-06-27 ENCOUNTER — Other Ambulatory Visit
Admission: RE | Admit: 2017-06-27 | Discharge: 2017-06-27 | Disposition: A | Discharge status: Home / Self Care | Payer: Self-pay | Source: Ambulatory Visit | Attending: Internal Medicine | Admitting: Internal Medicine

## 2017-06-27 ENCOUNTER — Encounter: Payer: Self-pay | Admitting: Internal Medicine

## 2017-06-27 VITALS — BP 142/82 | HR 70 | Ht 65.0 in | Wt 146.8 lb

## 2017-06-27 DIAGNOSIS — E785 Hyperlipidemia, unspecified: Secondary | ICD-10-CM

## 2017-06-27 DIAGNOSIS — I2510 Atherosclerotic heart disease of native coronary artery without angina pectoris: Secondary | ICD-10-CM | POA: Insufficient documentation

## 2017-06-27 DIAGNOSIS — I5022 Chronic systolic (congestive) heart failure: Secondary | ICD-10-CM | POA: Insufficient documentation

## 2017-06-27 DIAGNOSIS — I10 Essential (primary) hypertension: Secondary | ICD-10-CM | POA: Insufficient documentation

## 2017-06-27 DIAGNOSIS — R0989 Other specified symptoms and signs involving the circulatory and respiratory systems: Secondary | ICD-10-CM

## 2017-06-27 NOTE — Patient Instructions (Signed)
Medication Instructions:  Your physician recommends that you continue on your current medications as directed. Please refer to the Current Medication list given to you today.   Labwork: Your physician recommends that you return for lab work in: TODAY (Serum Aldosterone and renin activity, 24 hour urine for catecholamines and metanephrine). - Please go to the [facility]. You will check in at the front desk to the right as you walk into the atrium. Valet Parking is offered if needed.    Testing/Procedures: none  Follow-Up: Your physician recommends that you schedule a follow-up appointment in: 2 MONTHS WITH DR END.   If you need a refill on your cardiac medications before your next appointment, please call your pharmacy.

## 2017-06-27 NOTE — Progress Notes (Signed)
Follow-up Outpatient Visit Date: 06/27/2017  Primary Care Provider: Tommie Sams, DO [address] Laurell Josephs [address] [place] [address]  Chief Complaint: Lightheadedness and fatigue  HPI:  Crystal Roberson is a 51 y.o. year-old female with history of coronary artery disease status post anterior STEMI in 08/2016, ischemic cardiomyopathy status post ICD (05/2016), hypertension, migraine headaches, degenerative disc disease with chronic back pain, and depression, who presents for follow-up of lightheadedness, fatigue, and cardiomyopathy. I last saw her in late August, which time she continued to have frequent episodes of lightheadedness and near syncope. She was evaluated by Dr. Elberta Fortis and subsequently underwent ICD placement for primary prevention given her LVEF of 30-35% by recent catheterization. Follow-up interrogation on 9/17 showed no ventricular arrhythmias.  Today, [name] reports that she feels about the same. She still has some soreness at her ICD insertion site, though this is gradually getting better. She has not had any ICD shocks. She denies chest pain but has some heaviness across her chest with accompanying shortness of breath. This occurs with even light activity. She has not had any significant edema but feels as though she has gained weight. She is currently taking furosemide as needed for edema. At our last visit, we increased lisinopril to 2.5 mg twice a day. Due to lower blood pressures with systolic readings in the 90s, she went back to once daily dosing. Despite this, she Has continued to have some episodes of lightheadedness with accompanying hypotension. Sometimes the episodes will happen on a daily basis, though she has also gone for almost a week without a spell. She has not passed fallen.  Ms. Torosian continues to have back and leg pain. Her pain specialist would like to switch her to OxyContin, though this is currently cost prohibitive. She was denied disability but is still in  the process of trying to get Medicaid. She otherwise worries that she would be unable to afford any of her medications or doctor visits.  --------------------------------------------------------------------------------------------------  Cardiovascular History & Procedures: Cardiovascular Problems:  Coronary artery disease status post anterior STEMI (09/23/16)  Ischemic cardiomyopathy with severe LV systolic dysfunction  Risk Factors:  Known coronary artery disease, hypertension, and tobacco use  Cath/PCI:  LHC/RHC (04/26/17): LMCA normal. LAD with ostial vasospasm improved to 20% with intracoronary nitroglycerin. Patent stent in mid LAD. Distal LAD shows 50% stenosis, unchanged. There are 20% ostial and 30% mid LCx lesions. Sequential 40% and 20% mid and distal RCA stenoses present. LVEDP 15-18 mmHg. LVEF 30-35% with anterior and apical akinesis. RA 6, RV 32/5, PA 32/12 (19), PCWP 17. AO sat 93%. PA sat 64%. Fick CO/CI 4.8/2.8.  LHC/PCI (09/23/16): Anterior STEMI with 99% mid LAD stenosis with TIMI 2 flow. Mild to moderate, nonobstructive CAD involving the ostial and distal LAD, ostial and mid LCx, and mid/distal RCA. Severely reduced LV contraction (EF 25-35%) with anterior, apical, and apical inferior akinesis. Moderately elevated left ventricular filling pressure (LVEDP 27 mmHg).  CV Surgery:  None  EP Procedures and Devices:  ICD placement (05/31/17, Dr. Elberta Fortis): St. Jude single-chamber deveice  Event monitor (01/2017): Final report pending preliminary review today demonstrates normal sinus rhythm with no arrhythmias during triggered events.  Non-Invasive Evaluation(s):  Limited TTE (01/23/17): Dilated left ventricle with normal wall thickness. LVEF 35-40% with apical anterior, apical septal, and apical inferior hypokinesis. Mild to moderate MR. Mild left atrial enlargement. Normal RV size and function.  Transthoracic echocardiogram (11/06/16): LVEF 30-35% with severe  hypokinesis of mid/apical septal, anterior, and lateral walls,  as well as the apex. Moderate to severe MR. Mild left atrial enlargment. Moderate pulmonary hypertension.  Transthoracic echocardiogram (09/13/16): Normal LV size and contraction (EF 60-65%). Normal diastolic function. Trivial mitral and tricuspid regurgitation. Normal RV size and function.  Recent CV Pertinent Labs: Lab Results  Component Value Date   CHOL 102 03/30/2017   HDL 46.20 03/30/2017   LDLCALC 41 03/30/2017   TRIG 73.0 03/30/2017   CHOLHDL 2 03/30/2017   INR 1.0 04/16/2017   BNP 160.4 (H) 11/26/2016   K 3.9 05/30/2017   K 3.4 (L) 01/04/2012   MG 1.7 11/27/2016   BUN 15 05/30/2017   BUN 8 01/04/2012   CREATININE 0.86 05/30/2017   CREATININE 0.74 04/16/2017    Past medical and surgical history were reviewed and updated in EPIC.  Current Meds  Medication Sig  . aspirin 81 MG chewable tablet Chew 1 tablet (81 mg total) by mouth daily.  Marland Kitchen buPROPion (WELLBUTRIN XL) 150 MG 24 hr tablet TAKE 1 TABLET BY MOUTH ONCE DAILY (Patient taking differently: TAKE 150 MG BY MOUTH ONCE DAILY)  . carvedilol (COREG) 3.125 MG tablet Take 1 tablet (3.125 mg total) by mouth 2 (two) times daily.  . clopidogrel (PLAVIX) 75 MG tablet Take 1 tablet (75 mg total) by mouth daily.  Marland Kitchen FLUoxetine (PROZAC) 20 MG capsule Take 2 capsules (40 mg total) by mouth daily.  . furosemide (LASIX) 20 MG tablet Take 1 tablet (20 mg total) by mouth every other day.  . lidocaine (LIDODERM) 5 % Place 2-3 patches onto the skin every 12 (twelve) hours as needed (pain). Remove & Discard patch within 12 hours or as directed by MD   . lisinopril (PRINIVIL,ZESTRIL) 2.5 MG tablet Take 1 tablet (2.5 mg total) by mouth 2 (two) times daily.  . magnesium oxide (MAG-OX) 400 (241.3 Mg) MG tablet Take 1 tablet (400 mg total) by mouth 2 (two) times daily.  Marland Kitchen morphine (MS CONTIN) 15 MG 12 hr tablet Take 15 mg by mouth every 8 (eight) hours.   . nitroGLYCERIN (NITROSTAT)  0.4 MG SL tablet Place 1 tablet (0.4 mg total) under the tongue every 5 (five) minutes as needed for chest pain (CP or SOB).  . potassium chloride SA (K-DUR,KLOR-CON) 20 MEQ tablet Take 1 tablet (20 mEq total) by mouth daily.  Marland Kitchen tiZANidine (ZANAFLEX) 4 MG tablet Take 4-8 mg by mouth daily as needed for muscle spasms.  Marland Kitchen zolpidem (AMBIEN) 10 MG tablet Take 1 tablet (10 mg total) by mouth at bedtime as needed for sleep.    Allergies: Pregabalin and Codeine  Social History   Social History  . Marital status: Divorced    Spouse name: N/A  . Number of children: N/A  . Years of education: N/A   Occupational History  . Not on file.   Social History Main Topics  . Smoking status: Former Smoker    Packs/day: 0.75    Years: 30.00    Types: Cigarettes    Quit date: 09/23/2016  . Smokeless tobacco: Never Used  . Alcohol use No  . Drug use: No  . Sexual activity: Not Currently   Other Topics Concern  . Not on file   Social History Narrative  . No narrative on file    Family History  Problem Relation Age of Onset  . Hypertension Mother   . Arthritis Mother   . Hyperlipidemia Mother   . Mental illness Mother   . Hypertension Maternal Grandmother   . Heart disease Maternal  Grandmother     Review of Systems: A 12-system review of systems was performed and was negative except as noted in the HPI.  --------------------------------------------------------------------------------------------------  Physical Exam: BP (!) 142/82 (BP Location: Left Arm, Patient Position: Sitting, Cuff Size: Normal)   Pulse 70   Ht  (1.651 m)   Wt 146 lb 12 oz (66.6 kg)   BMI 24.42 kg/m   General:  Well developed, well-nourished woman, seated comfortably in the exam room. HEENT: No conjunctival pallor or scleral icterus. Moist mucous membranes.  OP clear. Neck: Supple without lymphadenopathy, thyromegaly, JVD, or HJR. Lungs: Normal work of breathing. Clear to auscultation bilaterally  without wheezes or crackles. Heart: Regular rate and rhythm without murmurs, rubs, or gallops. Non-displaced PMI. Abd: Bowel sounds present. Soft, NT/ND without hepatosplenomegaly Ext: No lower extremity edema. Radial, PT, and DP pulses are 2+ bilaterally. Skin: Warm and dry without rash. Left chest ICD incision site is well-healed with mild tenderness.  EKG:  Normal sinus rhythm with left atrial enlargement, low voltage, and septal infarct.  Lab Results  Component Value Date   WBC 5.6 05/22/2017   HGB 12.8 05/22/2017   HCT 38.8 05/22/2017   MCV 85 05/22/2017   PLT 191 05/22/2017    Lab Results  Component Value Date   NA 141 05/30/2017   K 3.9 05/30/2017   CL 103 05/30/2017   CO2 26 05/30/2017   BUN 15 05/30/2017   CREATININE 0.86 05/30/2017   GLUCOSE 112 (H) 05/30/2017   ALT 30 04/09/2017    Lab Results  Component Value Date   CHOL 102 03/30/2017   HDL 46.20 03/30/2017   LDLCALC 41 03/30/2017   TRIG 73.0 03/30/2017   CHOLHDL 2 03/30/2017    --------------------------------------------------------------------------------------------------  ASSESSMENT AND PLAN: Coronary artery disease with stable angina Patient's symptoms are not significant weight change. Cardiac catheterization in August showed patent LAD stent and mild to moderate nonobstructive CAD stable since 2017. We will continue with aspirin and clopidogrel through at least the Edahi Kroening of December, which time we will discuss the risks and benefits of continuing long-term DAPT.  Chronic systolic heart failure secondary to ischemic heart a myopathy Patient appears euvolemic with stable weight. She continues to have significant exertional dyspnea consistent with NYHA class room 3 heart failure. She did not tolerate up titration of lisinopril. We will defer further medication changes at this time. She should continue to weigh herself daily and uses furosemide as needed for swelling or weight gain.  Labile  hypertension Blood pressure mildly elevated today, though Ms. Whidby had hypotension with just slight escalation of lisinopril. Since significant blood pressure fluctuations predate her MI and cardiomyopathy, some other process may be driving this. We have agreed to check serum aldosterone, plasma renal activity, and urine catecholamines/metanephrines. After completion of this workup, we will consider adding spironolactone both for blood pressure control and optimization of heart failure.  Hyperlipidemia Ms. Tison is currently off atorvastatin due to considerable myalgias. Statin holiday does not seem to have improved her back and leg pain. We will restart atorvastatin 40 mg daily.  Follow-up: Return to clinic in 2 months.  Yvonne Kendall, MD 06/27/2017 1:42 PM

## 2017-06-28 ENCOUNTER — Other Ambulatory Visit
Admission: RE | Admit: 2017-06-28 | Discharge: 2017-06-28 | Disposition: A | Discharge status: Home / Self Care | Payer: Self-pay | Source: Ambulatory Visit | Attending: Internal Medicine | Admitting: Internal Medicine

## 2017-06-28 DIAGNOSIS — I5022 Chronic systolic (congestive) heart failure: Secondary | ICD-10-CM | POA: Insufficient documentation

## 2017-06-28 DIAGNOSIS — I2510 Atherosclerotic heart disease of native coronary artery without angina pectoris: Secondary | ICD-10-CM | POA: Insufficient documentation

## 2017-06-28 DIAGNOSIS — R0989 Other specified symptoms and signs involving the circulatory and respiratory systems: Secondary | ICD-10-CM | POA: Insufficient documentation

## 2017-06-28 MED ORDER — ATORVASTATIN CALCIUM 40 MG PO TABS: 40.0000 mg | ORAL_TABLET | Freq: Every day | ORAL | 3 refills | Status: DC

## 2017-06-28 NOTE — Addendum Note (Signed)
Addended by: Carnita Golob A on: 06/28/2017 11:28 AM   Modules accepted: Orders

## 2017-06-30 LAB — ALDOSTERONE + RENIN ACTIVITY W/ RATIO
ALDO / PRA Ratio: >65.3 — ABNORMAL HIGH (ref 0.0–30.0)
Aldosterone: 10.9 ng/dL (ref 0.0–30.0)

## 2017-07-02 LAB — METANEPHRINES, URINE, 24 HOUR
Metaneph Total, Ur: 31 ug/L
Metanephrines, 24H Ur: 36 ug/24 hr — ABNORMAL LOW (ref 45–290)
NORMETANEPHRINE 24H UR: 120 ug/(24.h) (ref 82–500)
NORMETANEPHRINE UR: 104 ug/L

## 2017-07-04 ENCOUNTER — Telehealth: Payer: Self-pay | Admitting: Internal Medicine

## 2017-07-04 ENCOUNTER — Telehealth: Payer: Self-pay | Admitting: *Deleted

## 2017-07-04 DIAGNOSIS — E269 Hyperaldosteronism, unspecified: Secondary | ICD-10-CM

## 2017-07-04 NOTE — Telephone Encounter (Signed)
No answer. Left message to call back.   

## 2017-07-04 NOTE — Telephone Encounter (Signed)
-----   Message from Yvonne Kendall, MD sent at 07/03/2017 10:15 PM EDT ----- Please let Ms. Pflum know that her metanephrines are not elevated; a pheochromocytoma is therefore unlikely. We should refer her to an endocrinologist for further workup of her hyperaldosteronism. Thanks.

## 2017-07-04 NOTE — Telephone Encounter (Signed)
Patient verbalized understanding of results and plan of care. Referral to [facility] in [place] entered. Advised patient to call us next week if she has not heard anything from their office about scheduling an appointment.

## 2017-07-04 NOTE — Telephone Encounter (Signed)
PT returning call

## 2017-07-04 NOTE — Telephone Encounter (Signed)
Error

## 2017-07-13 NOTE — Telephone Encounter (Signed)
S/w with scheduler at [facility]. She has attempted to reach the patient twice thus far and will keep trying.

## 2017-07-18 ENCOUNTER — Other Ambulatory Visit: Payer: Self-pay | Admitting: Family Medicine

## 2017-07-18 NOTE — Telephone Encounter (Signed)
Please confirm with patient that she's taking Wellbutrin. It does not appear that the Ambien is been filled in close to a year. Please see if she has been taking that and see what issue she is having with sleep. She will need to establish care with one of the providers here to get a refill for the Ambien.

## 2017-07-18 NOTE — Telephone Encounter (Signed)
Pt does not have app to establish care with another provider. Last seen by Dr. Adriana Simasook on 05/07/17

## 2017-07-31 NOTE — Telephone Encounter (Signed)
Left message to return call 

## 2017-08-07 NOTE — Telephone Encounter (Signed)
Patient is scheduled for follow up, unable to reach patient per phone

## 2017-08-24 ENCOUNTER — Encounter: Payer: Self-pay | Admitting: Family Medicine

## 2017-08-24 ENCOUNTER — Telehealth: Payer: Self-pay | Admitting: Family Medicine

## 2017-08-24 ENCOUNTER — Other Ambulatory Visit: Payer: Self-pay

## 2017-08-24 ENCOUNTER — Ambulatory Visit (INDEPENDENT_AMBULATORY_CARE_PROVIDER_SITE_OTHER): Payer: Self-pay | Admitting: Family Medicine

## 2017-08-24 ENCOUNTER — Other Ambulatory Visit: Payer: Self-pay | Admitting: Family Medicine

## 2017-08-24 VITALS — BP 160/90 | HR 81 | Temp 98.1°F | Ht 65.0 in | Wt 153.4 lb

## 2017-08-24 DIAGNOSIS — G8929 Other chronic pain: Secondary | ICD-10-CM

## 2017-08-24 DIAGNOSIS — E269 Hyperaldosteronism, unspecified: Secondary | ICD-10-CM

## 2017-08-24 DIAGNOSIS — I2510 Atherosclerotic heart disease of native coronary artery without angina pectoris: Secondary | ICD-10-CM

## 2017-08-24 DIAGNOSIS — I10 Essential (primary) hypertension: Secondary | ICD-10-CM

## 2017-08-24 LAB — BASIC METABOLIC PANEL
BUN: 16 mg/dL (ref 6–23)
CALCIUM: 9.5 mg/dL (ref 8.4–10.5)
CHLORIDE: 104 meq/L (ref 96–112)
CO2: 28 mEq/L (ref 19–32)
CREATININE: 0.99 mg/dL (ref 0.40–1.20)
GFR: 62.17 mL/min (ref >60.00)
Glucose, Bld: 124 mg/dL — ABNORMAL HIGH (ref 70–99)
Potassium: 3.9 mEq/L (ref 3.5–5.1)
Sodium: 139 mEq/L (ref 135–145)

## 2017-08-24 MED ORDER — SPIRONOLACTONE 25 MG PO TABS: 12.5000 mg | ORAL_TABLET | Freq: Every day | ORAL | 1 refills | Status: DC

## 2017-08-24 NOTE — Progress Notes (Signed)
Crystal AlarEric Limuel Nieblas, MD Phone: [phone]  Crystal Roberson is a 51 y.o. female who presents today for follow-up.  Previously blood pressure had been running around 135/60.  Recently has been quite a bit higher.  The highest it has been is 170/118.  She is on carvedilol 3.125 mg twice daily.  On lisinopril 2.5 mg daily.  Was previously on this twice daily though had significant drop in her blood pressure with this.  She notes chronic rare intermittent centralized chest tightness that can occur when she is just sitting there.  Her cardiologist is aware of this based on review of his note.  No shortness of breath.  Occasionally her feet will swell.  She has orthopnea though this is much improved.  Patient had an MI about a year ago and is on Plavix, Lipitor, and aspirin.  She was found to have depressed EF and has a defibrillator in place.  She has had no shocks from this.  She continues to be fatigued though improved from prior.  She describes episodes where she will feel like she is going to pass out and her mouth becomes dry and then her blood pressure will drop down to 60/35.  These last briefly and then she will feel drained.  They can occur as frequently as every couple of days though she has gone up to a week without any episodes.  It appears her cardiologist did workup for pheochromocytoma and hyperaldosteronism due to these symptoms.  She was found to have hyperaldosteronism and has been referred to endocrinology though has not gotten an appointment yet and she does not know if she would be able to afford an appointment.  Chronic back pain: Followed by pain management.  Reports she is not able to have any more procedures given the number of procedures she has had previously.  She is on morphine and Zanaflex.  She notes no numbness or weakness.  No loss of bowel or bladder function.  No saddle anesthesia.  Social History   Tobacco Use  Smoking Status Former Smoker  . Packs/day: 0.75  . Years: 30.00    . Pack years: 22.50  . Types: Cigarettes  . Last attempt to quit: 09/23/2016  . Years since quitting: 0.9  Smokeless Tobacco Never Used     ROS see history of present illness  Objective  Physical Exam Vitals:   08/24/17 1449  BP: (!) 160/90  Pulse: 81  Temp: 98.1 F (36.7 C)  SpO2: 98%    BP Readings from Last 3 Encounters:  08/24/17 (!) 160/90  06/27/17 (!) 142/82  06/01/17 99/66   Wt Readings from Last 3 Encounters:  08/24/17 153 lb 6.4 oz (69.6 kg)  06/27/17 146 lb 12 oz (66.6 kg)  05/31/17 145 lb (65.8 kg)    Physical Exam  Constitutional: No distress.  Cardiovascular: Normal rate, regular rhythm and normal heart sounds.  Pulmonary/Chest: Effort normal and breath sounds normal.  Musculoskeletal:  No midline spine tenderness, no midline spine step-off, muscular lumbar back tenderness noted with no overlying skin changes  Neurological: She is alert. Gait normal.  5/5 strength bilateral quads, hamstrings, plantar flexion, and dorsiflexion, sensation light touch intact bilateral lower extremities  Skin: She is not diaphoretic.     Assessment/Plan: Please see individual problem list.  Essential hypertension Uncontrolled recently.  Likely related to hyperaldosteronism.  We need to check a BMP and then can consider adding a low-dose of spironolactone.  We will check on endocrinology referral as well.  She  sees her cardiologist next week.    Coronary artery disease Improved compared to previously.  She will continue her current regimen.  She will follow-up with cardiology next week.  She is given return precautions.  Hyperaldosteronism (HCC) Noted on prior lab work.  I discussed this diagnosis with the patient.  We will check on referral to endocrinology.  Consider treatment with spironolactone pending BMP.  Chronic pain Chronic back pain.  Seems to be fairly stable.  She will continue to follow with pain management.   Crystal Roberson was seen today for establish  care.  Diagnoses and all orders for this visit:  Essential hypertension -     Basic Metabolic Panel (BMET)  Coronary artery disease involving native coronary artery of native heart without angina pectoris  Hyperaldosteronism (HCC)  Other chronic pain    Orders Placed This Encounter  Procedures  . Basic Metabolic Panel (BMET)    No orders of the defined types were placed in this encounter.    Crystal AlarEric Dream Harman, MD [facility]

## 2017-08-24 NOTE — Assessment & Plan Note (Signed)
Improved compared to previously.  She will continue her current regimen.  She will follow-up with cardiology next week.  She is given return precautions.

## 2017-08-24 NOTE — Assessment & Plan Note (Signed)
Noted on prior lab work.  I discussed this diagnosis with the patient.  We will check on referral to endocrinology.  Consider treatment with spironolactone pending BMP.

## 2017-08-24 NOTE — Assessment & Plan Note (Signed)
Uncontrolled recently.  Likely related to hyperaldosteronism.  We need to check a BMP and then can consider adding a low-dose of spironolactone.  We will check on endocrinology referral as well.  She sees her cardiologist next week.

## 2017-08-24 NOTE — Telephone Encounter (Signed)
Copied from CRM [id number] [id number]. Topic: Quick Communication - See Telephone Encounter >> Aug 24, 2017  3:53 PM Oneal GroutSebastian, Jennifer S wrote: CRM for notification. See Telephone encounter for: Requesting refill on Crystal Martinsambien, [facility]  08/24/17.

## 2017-08-24 NOTE — Assessment & Plan Note (Signed)
Chronic back pain.  Seems to be fairly stable.  She will continue to follow with pain management.

## 2017-08-24 NOTE — Patient Instructions (Signed)
Nice to meet you. We will check lab work today and then likely start you on spironolactone. You should keep your appointment with cardiology next week. If you develop persistent chest pain or shortness of breath or any new or changing symptoms please seek medical attention immediately.

## 2017-08-27 NOTE — Telephone Encounter (Signed)
Requesting refill on:    Ambien

## 2017-08-27 NOTE — Telephone Encounter (Signed)
Last OV 08/24/2017 Next OV not scheduled Last refill 08/11/2016

## 2017-08-28 MED ORDER — ZOLPIDEM TARTRATE 10 MG PO TABS: 10.0000 mg | ORAL_TABLET | Freq: Every evening | ORAL | 0 refills | Status: DC | PRN

## 2017-08-28 NOTE — Telephone Encounter (Signed)
Printed/signed/faxed

## 2017-08-28 NOTE — Telephone Encounter (Signed)
Needs follow-up scheduled.  You may fax the refill.

## 2017-08-29 ENCOUNTER — Ambulatory Visit (INDEPENDENT_AMBULATORY_CARE_PROVIDER_SITE_OTHER): Payer: Self-pay | Admitting: Internal Medicine

## 2017-08-29 ENCOUNTER — Encounter: Payer: Self-pay | Admitting: Internal Medicine

## 2017-08-29 VITALS — BP 106/80 | HR 71 | Ht 65.0 in | Wt 155.5 lb

## 2017-08-29 DIAGNOSIS — I152 Hypertension secondary to endocrine disorders: Secondary | ICD-10-CM

## 2017-08-29 DIAGNOSIS — I5022 Chronic systolic (congestive) heart failure: Secondary | ICD-10-CM

## 2017-08-29 DIAGNOSIS — I255 Ischemic cardiomyopathy: Secondary | ICD-10-CM

## 2017-08-29 DIAGNOSIS — E269 Hyperaldosteronism, unspecified: Secondary | ICD-10-CM

## 2017-08-29 DIAGNOSIS — I25118 Atherosclerotic heart disease of native coronary artery with other forms of angina pectoris: Secondary | ICD-10-CM

## 2017-08-29 DIAGNOSIS — E785 Hyperlipidemia, unspecified: Secondary | ICD-10-CM

## 2017-08-29 NOTE — Patient Instructions (Signed)
Medication Instructions:  Your physician has recommended you make the following change in your medication:  1- TAKE Furosemide 20 mg by mouth every day for 1 week, then take 20 mg by mouth every other day.   Labwork: none  Testing/Procedures: none  Follow-Up: Your physician recommends that you schedule a follow-up appointment in: 3 MONTHS WITH DR END.  - Monitor weight at home. The best time to weigh is first thing in the morning with about the same amount of clothing on each time.   If you need a refill on your cardiac medications before your next appointment, please call your pharmacy.

## 2017-08-29 NOTE — Progress Notes (Signed)
Follow-up Outpatient Visit Date: 08/29/2017  Primary Care Provider: Glori LuisSonnenberg, Eric G, MD [address] [place] [address]  Chief Complaint: Fatigue  HPI:  Crystal Roberson is a 51 y.o. year-old female with history of coronary artery disease status post anterior STEMI in 08/2016, ischemic cardiomyopathy status post ICD (05/2016), hypertension, migraine headaches, degenerative disc disease with chronic back pain, and depression, who presents for follow-up of labile blood pressure, coronary artery disease, and chronic systolic heart failure. I last saw Crystal Roberson in October, at which time we decided to pursue workup for secondary hypertension, given her labile blood pressures.  Workup for pheochromocytoma was negative.  However, serum aldosterone was normal with undetectable plasma renin activity.  Referral to endocrinology was made, but Crystal Roberson has yet to be seen in their office.  She was evaluated last week by Dr. Birdie SonsSonnenberg, who initiated spironolactone.  Today, Crystal Roberson reports that she is feeling a little better compared to previous visits.  She notes that over the last several months her blood pressure had been quite elevated, up to 180 mmHg systolic.  Since starting spironolactone 2-3 days ago, she notes that her blood pressure has gradually declined.  With her high blood pressure this fall, she experienced increased edema and shortness of breath.  She had not been taking furosemide regularly but is back on 20 mg every other day.  Her edema has resolved.  She continues to have some exertional dyspnea, though overall this has improved and is back to her baseline.  She notes occasional brief tightness in her chest lasting a few seconds.  It is nonexertional.  Crystal Roberson continues to have intermittent lightheadedness but has not passed out or fallen.  Crystal Roberson remains compliant with aspirin and clopidogrel, though she is concerned about the cost of clopidogrel.  She still notes some  irritation around her ICD implantation site.  She has not experienced any shocks.  She is scheduled for follow-up in the EP clinic later this month.  --------------------------------------------------------------------------------------------------  Cardiovascular History & Procedures: Cardiovascular Problems:  Coronary artery disease status post anterior STEMI (09/23/16)  Ischemic cardiomyopathy with severe LV systolic dysfunction  Risk Factors:  Known coronary artery disease, hypertension, and tobacco use  Cath/PCI:  LHC/RHC (04/26/17): LMCA normal. LAD with ostial vasospasm improved to 20% with intracoronary nitroglycerin. Patent stent in mid LAD. Distal LAD shows 50% stenosis, unchanged. There are 20% ostial and 30% mid LCx lesions. Sequential 40% and 20% mid and distal RCA stenoses present. LVEDP 15-18 mmHg. LVEF 30-35% with anterior and apical akinesis. RA 6, RV 32/5, PA 32/12 (19), PCWP 17. AO sat 93%. PA sat 64%. Fick CO/CI 4.8/2.8.  LHC/PCI (09/23/16): Anterior STEMI with 99% mid LAD stenosis with TIMI 2 flow. Mild to moderate, nonobstructive CAD involving the ostial and distal LAD, ostial and mid LCx, and mid/distal RCA. Severely reduced LV contraction (EF 25-35%) with anterior, apical, and apical inferior akinesis. Moderately elevated left ventricular filling pressure (LVEDP 27 mmHg).  CV Surgery:  None  EP Procedures and Devices:  ICD placement (05/31/17, Dr. Elberta Fortisamnitz): St. Jude single-chamber deveice  Event monitor (01/2017): Final report pending preliminary review today demonstrates normal sinus rhythm with no arrhythmias during triggered events.  Non-Invasive Evaluation(s):  Limited TTE (01/23/17): Dilated left ventricle with normal wall thickness. LVEF 35-40% with apical anterior, apical septal, and apical inferior hypokinesis. Mild to moderate MR. Mild left atrial enlargement. Normal RV size and function.  Transthoracic echocardiogram (11/06/16): LVEF 30-35% with  severe hypokinesis of  mid/apical septal, anterior, and lateral walls, as well as the apex. Moderate to severe MR. Mild left atrial enlargment. Moderate pulmonary hypertension.  Transthoracic echocardiogram (09/13/16): Normal LV size and contraction (EF 60-65%). Normal diastolic function. Trivial mitral and tricuspid regurgitation. Normal RV size and function.  Recent CV Pertinent Labs: Lab Results  Component Value Date   CHOL 102 03/30/2017   HDL 46.20 03/30/2017   LDLCALC 41 03/30/2017   TRIG 73.0 03/30/2017   CHOLHDL 2 03/30/2017   INR 1.0 04/16/2017   BNP 160.4 (H) 11/26/2016   K 3.9 08/24/2017   K 3.4 (L) 01/04/2012   MG 1.7 11/27/2016   BUN 16 08/24/2017   BUN 15 05/30/2017   BUN 8 01/04/2012   CREATININE 0.99 08/24/2017   CREATININE 0.74 04/16/2017    Past medical and surgical history were reviewed and updated in EPIC.  Current Meds  Medication Sig  . aspirin 81 MG chewable tablet Chew 1 tablet (81 mg total) by mouth daily.  Marland Kitchen atorvastatin (LIPITOR) 40 MG tablet Take 1 tablet (40 mg total) by mouth daily.  Marland Kitchen buPROPion (WELLBUTRIN XL) 150 MG 24 hr tablet TAKE 1 TABLET BY MOUTH ONCE A DAY  . carvedilol (COREG) 3.125 MG tablet Take 1 tablet (3.125 mg total) by mouth 2 (two) times daily.  . clopidogrel (PLAVIX) 75 MG tablet Take 1 tablet (75 mg total) by mouth daily.  Marland Kitchen FLUoxetine (PROZAC) 20 MG capsule Take 2 capsules (40 mg total) by mouth daily.  . furosemide (LASIX) 20 MG tablet Take 1 tablet (20 mg total) by mouth every other day.  . lidocaine (LIDODERM) 5 % Place 2-3 patches onto the skin every 12 (twelve) hours as needed (pain). Remove & Discard patch within 12 hours or as directed by MD   . magnesium oxide (MAG-OX) 400 (241.3 Mg) MG tablet Take 1 tablet (400 mg total) by mouth 2 (two) times daily.  Marland Kitchen morphine (MS CONTIN) 15 MG 12 hr tablet Take 15 mg by mouth every 8 (eight) hours.   . nitroGLYCERIN (NITROSTAT) 0.4 MG SL tablet Place 1 tablet (0.4 mg total) under the  tongue every 5 (five) minutes as needed for chest pain (CP or SOB).  . potassium chloride SA (K-DUR,KLOR-CON) 20 MEQ tablet Take 1 tablet (20 mEq total) by mouth daily.  Marland Kitchen spironolactone (ALDACTONE) 25 MG tablet Take 0.5 tablets (12.5 mg total) by mouth daily.  Marland Kitchen tiZANidine (ZANAFLEX) 4 MG tablet Take 4-8 mg by mouth daily as needed for muscle spasms.  Marland Kitchen zolpidem (AMBIEN) 10 MG tablet Take 1 tablet (10 mg total) by mouth at bedtime as needed for sleep.    Allergies: Pregabalin and Codeine  Social History   Socioeconomic History  . Marital status: Divorced    Spouse name: Not on file  . Number of children: Not on file  . Years of education: Not on file  . Highest education level: Not on file  Social Needs  . Financial resource strain: Not on file  . Food insecurity - worry: Not on file  . Food insecurity - inability: Not on file  . Transportation needs - medical: Not on file  . Transportation needs - non-medical: Not on file  Occupational History  . Not on file  Tobacco Use  . Smoking status: Former Smoker    Packs/day: 0.75    Years: 30.00    Pack years: 22.50    Types: Cigarettes    Last attempt to quit: 09/23/2016    Years since quitting:  0.9  . Smokeless tobacco: Never Used  Substance and Sexual Activity  . Alcohol use: No    Alcohol/week: 0.0 oz  . Drug use: No  . Sexual activity: Not Currently  Other Topics Concern  . Not on file  Social History Narrative  . Not on file    Family History  Problem Relation Age of Onset  . Hypertension Mother   . Arthritis Mother   . Hyperlipidemia Mother   . Mental illness Mother   . Hypertension Maternal Grandmother   . Heart disease Maternal Grandmother     Review of Systems: Crystal Roberson continues to have intermittent back pain.  She also had significant lower extremity discomfort when her legs were swollen, though this has improved significantly.  Otherwise, a 12 system review of systems was performed and was negative,  except as noted in the HPI.  --------------------------------------------------------------------------------------------------  Physical Exam: BP 106/80 (BP Location: Left Arm, Patient Position: Sitting, Cuff Size: Normal)   Pulse 71   Ht 5\' 5"  (1.651 m)   Wt 155 lb 8 oz (70.5 kg)   BMI 25.88 kg/m   General: Well-developed, well-nourished woman, seated comfortably in the exam room.  She is accompanied by her significant other. HEENT: No conjunctival pallor or scleral icterus. Moist mucous membranes.  OP clear. Neck: Supple without lymphadenopathy, thyromegaly, JVD, or HJR. Lungs: Normal work of breathing. Clear to auscultation bilaterally without wheezes or crackles. Heart: Regular rate and rhythm without murmurs, rubs, or gallops. Non-displaced PMI. Abd: Bowel sounds present. Soft, NT/ND without hepatosplenomegaly Ext: No lower extremity edema.  Skin: Warm and dry without rash.  EKG: Normal sinus rhythm with low voltage QRS, anteroseptal Q waves, and nonspecific T wave changes.  No significant change since 06/27/17.  Lab Results  Component Value Date   WBC 5.6 05/22/2017   HGB 12.8 05/22/2017   HCT 38.8 05/22/2017   MCV 85 05/22/2017   PLT 191 05/22/2017    Lab Results  Component Value Date   NA 139 08/24/2017   K 3.9 08/24/2017   CL 104 08/24/2017   CO2 28 08/24/2017   BUN 16 08/24/2017   CREATININE 0.99 08/24/2017   GLUCOSE 124 (H) 08/24/2017   ALT 30 04/09/2017    Lab Results  Component Value Date   CHOL 102 03/30/2017   HDL 46.20 03/30/2017   LDLCALC 41 03/30/2017   TRIG 73.0 03/30/2017   CHOLHDL 2 03/30/2017   Serum aldosterone: 10.9 Plasma renin activity: <0.167 Aldosterone/PRA ratio: >65.3  --------------------------------------------------------------------------------------------------  ASSESSMENT AND PLAN: Coronary artery disease with stable angina Overall, Crystal Roberson feels about the same.  She still has sporadic chest pain that is not  exertional.  I wonder if some of this is related to recent fluid retention and uncontrolled hypertension.  Cardiac catheterization in August showed patent mid LAD stent and stable nonobstructive CAD involving the distal LAD, LCx, and RCA.  I have encouraged her to continue her current medications with the exception of escalation of furosemide, as detailed below.  It will be one year from her MI at the [place] of this month.  We discussed risks and benefits of long-term continuation of clopidogrel.  She is concerned about the cost but is applying for medication assistance.  If she is able to obtain this, we have agreed to continue indefinite DAPT.  Chronic systolic heart failure secondary to ischemic cardiomyopathy Crystal Roberson appears euvolemic on exam, though her weight is up 9 pounds since our last visit.  We have again  discussed the importance of low-sodium diet.  We will increase furosemide to 20 mg daily for a week and then return back to 20 mg every other day.  I will not change her current doses of carvedilol, lisinopril, and spironolactone.  Crystal Roberson should continue routine EP follow-up of her ICD.  Hypertension and hyperaldosteronism Blood pressure is well controlled today.  Crystal Roberson still notes occasional lightheadedness, albeit less pronounced than at previous visits.  I agree with recent addition of spironolactone.  Repeat labs ordered by Dr. De NurseSonneberg are due next week.  If potassium and renal function allow, it may be reasonable to increase spironolactone to 25 mg daily.  I will defer this to Dr. De NurseSonneberg.  I have encouraged Crystal Roberson to proceed with endocrinology referral for further workup of hyperaldosteronism.  Hyperlipidemia Continue atorvastatin 40 mg daily.  Follow-up: Return to clinic in 3 months.  Yvonne Kendallhristopher Jillene Wehrenberg, MD 08/29/2017 8:15 AM

## 2017-08-29 NOTE — Telephone Encounter (Signed)
Please schedule patient for follow up.

## 2017-08-31 NOTE — Telephone Encounter (Signed)
Pt is scheduled. Thank you.

## 2017-08-31 NOTE — Addendum Note (Signed)
Addended by: Kendrick FriesLOPEZ, MARINA C on: 08/31/2017 07:22 AM   Modules accepted: Orders

## 2017-09-05 ENCOUNTER — Other Ambulatory Visit (INDEPENDENT_AMBULATORY_CARE_PROVIDER_SITE_OTHER): Payer: Self-pay

## 2017-09-05 DIAGNOSIS — I10 Essential (primary) hypertension: Secondary | ICD-10-CM

## 2017-09-05 LAB — BASIC METABOLIC PANEL
BUN: 11 mg/dL (ref 6–23)
CALCIUM: 8.9 mg/dL (ref 8.4–10.5)
CO2: 27 mEq/L (ref 19–32)
Chloride: 103 mEq/L (ref 96–112)
Creatinine, Ser: 0.97 mg/dL (ref 0.40–1.20)
GFR: 63.64 mL/min (ref >60.00)
Glucose, Bld: 140 mg/dL — ABNORMAL HIGH (ref 70–99)
POTASSIUM: 3.6 meq/L (ref 3.5–5.1)
SODIUM: 138 meq/L (ref 135–145)

## 2017-09-07 ENCOUNTER — Other Ambulatory Visit: Payer: Self-pay | Admitting: Internal Medicine

## 2017-09-07 ENCOUNTER — Telehealth: Payer: Self-pay | Admitting: Internal Medicine

## 2017-09-07 NOTE — Telephone Encounter (Signed)
I am happy to write a letter describing her medical conditions and ongoing treatment. The decision about her qualification can only be made by Medicaid and is based not only on her medical history but also her income and ability to work. If she wishes that I write a letter, please let me know and I will work on it next week.  Yvonne Kendallhristopher Chailyn Racette, MD [facility] Pager: [phone]

## 2017-09-07 NOTE — Telephone Encounter (Signed)
Patient has a medicaid disability appt on Tuesday and needs a letter    she needs Medical evidence letter to consider granting medicaid     Please call when ready for pick up

## 2017-09-07 NOTE — Telephone Encounter (Signed)
S/w patient. She needs a letter explaining that she has medical evidence to need Medicaid. She states that if she does not qualify, it may affect her ability to receive specialty care and get her medications. Advised I will route to Dr End for review and to call us back Monday for an update if she does not here anything.

## 2017-09-10 NOTE — Telephone Encounter (Signed)
Patient coming today to pick up records but would prefer Dr. Okey DupreEnd to write a letter as well.

## 2017-09-10 NOTE — Telephone Encounter (Signed)
Will forward to Dr. Okey DupreEnd for letter.

## 2017-09-11 NOTE — Telephone Encounter (Signed)
I spoke with the patient. She is aware that her letter for Medicaid has been completed by Dr. Okey DupreEnd. She states that her hearing was today, but she still wants to come by and pick up the letter. She is aware that I will place this at the front desk for pick up. She is agreeable.

## 2017-09-13 ENCOUNTER — Ambulatory Visit (INDEPENDENT_AMBULATORY_CARE_PROVIDER_SITE_OTHER): Payer: Self-pay | Admitting: Cardiology

## 2017-09-13 ENCOUNTER — Encounter: Payer: Self-pay | Admitting: Cardiology

## 2017-09-13 VITALS — BP 162/110 | HR 70 | Ht 65.0 in | Wt 155.6 lb

## 2017-09-13 DIAGNOSIS — I255 Ischemic cardiomyopathy: Secondary | ICD-10-CM

## 2017-09-13 DIAGNOSIS — E785 Hyperlipidemia, unspecified: Secondary | ICD-10-CM

## 2017-09-13 DIAGNOSIS — I25118 Atherosclerotic heart disease of native coronary artery with other forms of angina pectoris: Secondary | ICD-10-CM

## 2017-09-13 DIAGNOSIS — I5022 Chronic systolic (congestive) heart failure: Secondary | ICD-10-CM

## 2017-09-13 LAB — CUP PACEART INCLINIC DEVICE CHECK
Date Time Interrogation Session: 20181220160548
HighPow Impedance: 61.875
Implantable Lead Implant Date: 20180906
Implantable Lead Location: 753860
Implantable Pulse Generator Implant Date: 20180906
Lead Channel Impedance Value: 387.5 Ohm
Lead Channel Pacing Threshold Amplitude: 0.75 V
Lead Channel Pacing Threshold Pulse Width: 0.5 ms
Lead Channel Pacing Threshold Pulse Width: 0.5 ms
MDC IDC MSMT BATTERY REMAINING LONGEVITY: 100 mo
MDC IDC MSMT LEADCHNL RV PACING THRESHOLD AMPLITUDE: 0.75 V
MDC IDC MSMT LEADCHNL RV SENSING INTR AMPL: 12 mV
MDC IDC PG SERIAL: 9783396
MDC IDC SET LEADCHNL RV PACING AMPLITUDE: 2.5 V
MDC IDC SET LEADCHNL RV PACING PULSEWIDTH: 0.5 ms
MDC IDC SET LEADCHNL RV SENSING SENSITIVITY: 0.5 mV
MDC IDC STAT BRADY RV PERCENT PACED: 0 %

## 2017-09-13 NOTE — Progress Notes (Signed)
Electrophysiology Office Note   Date:  09/13/2017   ID:  TAJUANNA BURNETT, DOB [birth year], MRN [id number]  PCP:  Glori Luis, MD  Cardiologist:  End Primary Electrophysiologist:  Will Jorja Loa, MD    Chief Complaint  Patient presents with  . Defib check    Ischemic cardiomyopathy/Chronic systolic HF     History of Present Illness: Crystal Roberson is a 51 y.o. female who is being seen today for the evaluation of chronic systolic heart failure at the request of Tommie Sams, DO. Presenting today for electrophysiology evaluation. She has a history of coronary artery disease status post STEMI in 2017, ischemic myopathy with an EF 35-40% by most recent echo, hypertension, migraines, degenerative disc disease, and depression. She had a right and left heart catheterization to reassess coronary disease in July 2018. She was found to have nonobstructive coronary disease with an EF of 30-35% and normal filling pressures.  She had a Saint Jude ICD implanted 05/31/17.  Today, denies symptoms of palpitations, chest pain, shortness of breath, orthopnea, PND, lower extremity edema, claudication, dizziness, presyncope, syncope, bleeding, or neurologic sequela. The patient is tolerating medications without difficulties.  She is overall feeling well.  She continues to have episodes of dizziness with low blood pressures.  She was recently started on 12.5 mg of Aldactone.  She is also had chronic stable angina that is unchanged from her prior chest pain.  Past Medical History:  Diagnosis Date  . Allergy   . Chicken pox   . Chronic back pain   . DDD (degenerative disc disease), lumbar   . Depression   . Heart attack (HCC)   . Hypertension   . Migraines   . Syncope 09/12/2016   Past Surgical History:  Procedure Laterality Date  . CARDIAC CATHETERIZATION N/A 09/23/2016   Procedure: Left Heart Cath and Coronary Angiography;  Surgeon: Yvonne Kendall, MD;  Location: [facility] INVASIVE CV LAB;  Service:  Cardiovascular;  Laterality: N/A;  . CARDIAC CATHETERIZATION N/A 09/23/2016   Procedure: Coronary Stent Intervention;  Surgeon: Yvonne Kendall, MD;  Location: [facility] INVASIVE CV LAB;  Service: Cardiovascular;  Laterality: N/A;  . CERVICAL DISC SURGERY    . ICD IMPLANT N/A 05/31/2017   Procedure: ICD Implant;  Surgeon: Regan Lemming, MD;  Location: [facility] INVASIVE CV LAB;  Service: Cardiovascular;  Laterality: N/A;  . RIGHT/LEFT HEART CATH AND CORONARY ANGIOGRAPHY N/A 04/26/2017   Procedure: Right/Left Heart Cath and Coronary Angiography;  Surgeon: Yvonne Kendall, MD;  Location: [facility] INVASIVE CV LAB;  Service: Cardiovascular;  Laterality: N/A;  . TUBAL LIGATION    . ULTRASOUND GUIDANCE FOR VASCULAR ACCESS  04/26/2017   Procedure: Ultrasound Guidance For Vascular Access;  Surgeon: Yvonne Kendall, MD;  Location: [facility] INVASIVE CV LAB;  Service: Cardiovascular;;     Current Outpatient Medications  Medication Sig Dispense Refill  . aspirin 81 MG chewable tablet Chew 1 tablet (81 mg total) by mouth daily. 30 tablet 11  . atorvastatin (LIPITOR) 40 MG tablet Take 1 tablet (40 mg total) by mouth daily. 90 tablet 3  . buPROPion (WELLBUTRIN XL) 150 MG 24 hr tablet TAKE 1 TABLET BY MOUTH ONCE A DAY 90 tablet 0  . carvedilol (COREG) 3.125 MG tablet Take 1 tablet (3.125 mg total) by mouth 2 (two) times daily. 180 tablet 3  . clopidogrel (PLAVIX) 75 MG tablet TAKE 1 TABLET BY MOUTH DAILY 30 tablet 3  . FLUoxetine (PROZAC) 20 MG capsule Take 2 capsules (40 mg total)  by mouth daily. 180 capsule 1  . furosemide (LASIX) 20 MG tablet Take 1 tablet (20 mg total) by mouth every other day. 30 tablet 5  . lidocaine (LIDODERM) 5 % Place 2-3 patches onto the skin every 12 (twelve) hours as needed (pain). Remove & Discard patch within 12 hours or as directed by MD     . magnesium oxide (MAG-OX) 400 (241.3 Mg) MG tablet Take 1 tablet (400 mg total) by mouth 2 (two) times daily. 60 tablet 6  . morphine (MS CONTIN) 15 MG 12 hr  tablet Take 15 mg by mouth every 8 (eight) hours.     . nitroGLYCERIN (NITROSTAT) 0.4 MG SL tablet Place 1 tablet (0.4 mg total) under the tongue every 5 (five) minutes as needed for chest pain (CP or SOB). 25 tablet 12  . potassium chloride SA (K-DUR,KLOR-CON) 20 MEQ tablet Take 1 tablet (20 mEq total) by mouth daily. 30 tablet 11  . spironolactone (ALDACTONE) 25 MG tablet Take 0.5 tablets (12.5 mg total) by mouth daily. 45 tablet 1  . tiZANidine (ZANAFLEX) 4 MG tablet Take 4-8 mg by mouth daily as needed for muscle spasms.    Marland Kitchen. zolpidem (AMBIEN) 10 MG tablet Take 1 tablet (10 mg total) by mouth at bedtime as needed for sleep. 30 tablet 0  . lisinopril (PRINIVIL,ZESTRIL) 2.5 MG tablet Take 1 tablet (2.5 mg total) by mouth 2 (two) times daily. (Patient taking differently: Take 2.5 mg by mouth daily. ) 180 tablet 1   No current facility-administered medications for this visit.     Allergies:   Pregabalin and Codeine   Social History:  The patient  reports that she quit smoking about a year ago. Her smoking use included cigarettes. She has a 22.50 pack-year smoking history. she has never used smokeless tobacco. She reports that she does not drink alcohol or use drugs.   Family History:  The patient's family history includes Arthritis in her mother; Heart disease in her maternal grandmother; Hyperlipidemia in her mother; Hypertension in her maternal grandmother and mother; Mental illness in her mother.   ROS:  Please see the history of present illness.   Otherwise, review of systems is positive for weight gain, fatigue, fever, chest pain, leg pain, shortness of breath, cough, snoring, constipation, depression, back pain, muscle pain, joint swelling, balance problems, headaches, dizziness, easy bruising.   All other systems are reviewed and negative.   PHYSICAL EXAM: VS:  BP (!) 162/110   Pulse 70   Ht 5\' 5"  (1.651 m)   Wt 155 lb 9.6 oz (70.6 kg)   BMI 25.89 kg/m  , BMI Body mass index is 25.89  kg/m. GEN: Well nourished, well developed, in no acute distress  HEENT: normal  Neck: no JVD, carotid bruits, or masses Cardiac: RRR; no murmurs, rubs, or gallops,no edema  Respiratory:  clear to auscultation bilaterally, normal work of breathing GI: soft, nontender, nondistended, + BS MS: no deformity or atrophy  Skin: warm and dry, device site well healed Neuro:  Strength and sensation are intact Psych: euthymic mood, full affect  EKG:  EKG is ordered today. Personal review of the ekg ordered shows sinus rhythm, septal infarct, old  Personal review of the device interrogation today. Results in [facility]   Recent Labs: 09/13/2016: TSH 1.804 11/26/2016: B Natriuretic Peptide 160.4 11/27/2016: Magnesium 1.7 04/09/2017: ALT 30 05/22/2017: Hemoglobin 12.8; Platelets 191 09/05/2017: BUN 11; Creatinine, Ser 0.97; Potassium 3.6; Sodium 138    Lipid Panel  Component Value Date/Time   CHOL 102 03/30/2017 1521   TRIG 73.0 03/30/2017 1521   HDL 46.20 03/30/2017 1521   CHOLHDL 2 03/30/2017 1521   VLDL 14.6 03/30/2017 1521   LDLCALC 41 03/30/2017 1521     Wt Readings from Last 3 Encounters:  09/13/17 155 lb 9.6 oz (70.6 kg)  08/29/17 155 lb 8 oz (70.5 kg)  08/24/17 153 lb 6.4 oz (69.6 kg)      Other studies Reviewed: Additional studies/ records that were reviewed today include: TTE 01/23/17  Review of the above records today demonstrates:  - Left ventricle: Septal apical and distal inferior and anterio   wall hypokinesis. The cavity size was moderately dilated. Wall   thickness was normal. Systolic function was moderately reduced.   The estimated ejection fraction was in the range of 35% to 40%. - Mitral valve: There was mild to moderate regurgitation. Valve   area by pressure half-time: 2.14 cm^2. - Left atrium: The atrium was mildly dilated. - Atrial septum: No defect or patent foramen ovale was identified.  14 day monitor 02/11/17 - personally reviewed  The patient was  monitred for 14 days.  The predominant rhythm was sinus with an average rate of 64 bpm (range 51-107 bpm).  There was no significant ventricular or supraventricular ectopy.  No sustained arrhythmias or prolonged pauses were identified.  Patient reported symptoms of "passed out," "lightheadedness," "dizziness," "shortness of breath" and "tired or fatigued" correspond to sinus rhythm and sinus rhythm with artifact.  Cath 04/26/17 1. Mild to moderate, non-obstructive coronary artery disease, stable since 08/2016. Coronary vasospasm was noted in the ostial LAD, which resolved with intracoronary nitroglycerin. 2. Widely patent stent in the mid LAD. 3. Moderate to severely reduced LV contraction (LVEF 30-35%) with mid/distal LAD territory akinesis. 4. Upper normal to mildly elevated left heart filling pressures. 5. Normal right heart and pulmonary artery pressures. 6. Normal Fick cardiac output/index.  ASSESSMENT AND PLAN:  1.  Chronic systolic heart failure secondary to ischemic cardiomyopathy: Saint Jude ICD implanted 05/31/17.  This is functioning appropriately.  No changes.  2. Coronary artery disease with chronic stable angina: Does have on and off chest pressure that has been stable over the past few months.  No changes at this time.  3. Hypertension: Blood pressure has been elevated but also has had issues with hypotension.  Will allow her primary cardiologist and primary physician to adjust medications at this time.  4. Syncope:No further episodes since ICD was implanted.   5. Hyperlipidemia: Not tolerated statins in the past.  Per primary cardiology.    Current medicines are reviewed at length with the patient today.   The patient does not have concerns regarding her medicines.  The following changes were made today: None  Labs/ tests ordered today include:  Orders Placed This Encounter  Procedures  . EKG 12-Lead     Disposition:   FU with Will Camnitz 9  months  Signed, Will Jorja LoaMartin Camnitz, MD  09/13/2017 11:23 AM     [facility] [address] [place] [address] [phone] (office) [phone] (fax)

## 2017-09-20 ENCOUNTER — Telehealth: Payer: Self-pay

## 2017-09-20 NOTE — Telephone Encounter (Signed)
Left vm requesting patient call back to schedule her new patient appointment

## 2017-11-06 ENCOUNTER — Other Ambulatory Visit: Payer: Self-pay | Admitting: Family Medicine

## 2017-11-07 NOTE — Telephone Encounter (Signed)
Last OV 08/24/17 last filled 08/28/17 30 0rf

## 2017-11-08 NOTE — Telephone Encounter (Signed)
faxed

## 2017-11-09 ENCOUNTER — Ambulatory Visit: Payer: Self-pay | Admitting: Family Medicine

## 2017-11-29 ENCOUNTER — Encounter: Payer: Self-pay | Admitting: Family Medicine

## 2017-11-29 ENCOUNTER — Other Ambulatory Visit: Payer: Self-pay

## 2017-11-29 ENCOUNTER — Ambulatory Visit (INDEPENDENT_AMBULATORY_CARE_PROVIDER_SITE_OTHER): Payer: Self-pay | Admitting: Family Medicine

## 2017-11-29 VITALS — BP 180/118 | HR 86 | Temp 98.2°F | Wt 161.8 lb

## 2017-11-29 DIAGNOSIS — R7309 Other abnormal glucose: Secondary | ICD-10-CM

## 2017-11-29 DIAGNOSIS — G4709 Other insomnia: Secondary | ICD-10-CM

## 2017-11-29 DIAGNOSIS — I152 Hypertension secondary to endocrine disorders: Secondary | ICD-10-CM

## 2017-11-29 DIAGNOSIS — F331 Major depressive disorder, recurrent, moderate: Secondary | ICD-10-CM

## 2017-11-29 DIAGNOSIS — E269 Hyperaldosteronism, unspecified: Secondary | ICD-10-CM

## 2017-11-29 LAB — BASIC METABOLIC PANEL
BUN: 11 mg/dL (ref 6–23)
CO2: 28 mEq/L (ref 19–32)
Calcium: 9.7 mg/dL (ref 8.4–10.5)
Chloride: 104 mEq/L (ref 96–112)
Creatinine, Ser: 0.81 mg/dL (ref 0.40–1.20)
GFR: 78.29 mL/min (ref >60.00)
Glucose, Bld: 131 mg/dL — ABNORMAL HIGH (ref 70–99)
POTASSIUM: 4 meq/L (ref 3.5–5.1)
SODIUM: 137 meq/L (ref 135–145)

## 2017-11-29 LAB — HEMOGLOBIN A1C: HEMOGLOBIN A1C: 5.7 % (ref 4.6–6.5)

## 2017-11-29 MED ORDER — ZOLPIDEM TARTRATE 10 MG PO TABS: 10.0000 mg | ORAL_TABLET | Freq: Every evening | ORAL | 1 refills | Status: DC | PRN

## 2017-11-29 MED ORDER — SPIRONOLACTONE 25 MG PO TABS
25.0000 mg | ORAL_TABLET | Freq: Every day | ORAL | 1 refills | Status: DC
Start: 2017-11-29 — End: 2017-12-19

## 2017-11-29 MED ORDER — ESCITALOPRAM OXALATE 10 MG PO TABS: 10.0000 mg | ORAL_TABLET | Freq: Every day | ORAL | 1 refills | Status: DC

## 2017-11-29 NOTE — Patient Instructions (Signed)
Nice to see you. We will start you on lexapro for your depression. If you develop thoughts of harming yourself please go to the ED.  We will check labs today and call with the results.  We will increase your spironolactone to 25 mg daily. Please continue to monitor your BP.  If you develop chest pain, shortness of breath, or any new or change in symptoms please seek medical attention.

## 2017-11-29 NOTE — Assessment & Plan Note (Signed)
Reinforced the need to see endocrinology.  She is working on Health visitorgetting Medicaid.  If she is unable to get this we could check into patient assistance.  Continue spironolactone.

## 2017-11-29 NOTE — Assessment & Plan Note (Signed)
Established problem, worsening.  We will place her on Lexapro given Prozac was not terribly beneficial.  She will continue Wellbutrin.  Given return precautions.

## 2017-11-29 NOTE — Assessment & Plan Note (Signed)
Improved control at home.  Poorly controlled today in the office.  No acute cardiac symptoms.  Breathing has improved compared to prior.  Will increase her Spironolactone to 25 mg.  We will check a BMP.  She will continue her other medications for blood pressure.  Return in 1 week for recheck.  Given return precautions.

## 2017-11-29 NOTE — Assessment & Plan Note (Signed)
Chronic issue.  Relatively stable.  Suspect depression and anxiety play some kind of role.  Also her chronic pain.  She will continue Ambien as needed.  Refill sent to pharmacy.

## 2017-11-29 NOTE — Progress Notes (Signed)
[name], MD Phone: [phone]  Crystal Roberson is a 52 y.o. female who presents today for f/u.  HYPERTENSION  Disease Monitoring  Home BP Monitoring 139-149/80s, occasional spikes Chest pain- no    Dyspnea- chronic and stable, actually improved from prior Medications  Compliance-  Taking coreg, lisinopril, spironolactone.  Edema- improved, chronic and intermittent  Insomnia: Doing well on Ambien.  Takes it twice a week.  Gets 4 hours of sleep with it.  No excessive drowsiness.  Depression: She has been off of Prozac for about 2-1/2 months.  Notes her depression has worsened over that period of time.  She is still taking Wellbutrin.  She has no SI though does note SI in the distant past.  Has not recurred.  She cannot afford therapy or psychiatry.  Social History   Tobacco Use  Smoking Status Former Smoker  . Packs/day: 0.75  . Years: 30.00  . Pack years: 22.50  . Types: Cigarettes  . Last attempt to quit: 09/23/2016  . Years since quitting: 1.1  Smokeless Tobacco Never Used     ROS see history of present illness  Objective  Physical Exam Vitals:   11/29/17 1127  BP: (!) 180/118  Pulse: 86  Temp: 98.2 F (36.8 C)  SpO2: 98%    BP Readings from Last 3 Encounters:  11/29/17 (!) 180/118  09/13/17 (!) 162/110  08/29/17 106/80   Wt Readings from Last 3 Encounters:  11/29/17 161 lb 12.8 oz (73.4 kg)  09/13/17 155 lb 9.6 oz (70.6 kg)  08/29/17 155 lb 8 oz (70.5 kg)    Physical Exam  Constitutional: No distress.  Cardiovascular: Normal rate, regular rhythm and normal heart sounds.  Pulmonary/Chest: Effort normal and breath sounds normal.  Musculoskeletal: She exhibits no edema.  Neurological: She is alert. Gait normal.  Skin: Skin is warm and dry. She is not diaphoretic.     Assessment/Plan: Please see individual problem list.  Hypertension Improved control at home.  Poorly controlled today in the office.  No acute cardiac symptoms.  Breathing has  improved compared to prior.  Will increase her Spironolactone to 25 mg.  We will check a BMP.  She will continue her other medications for blood pressure.  Return in 1 week for recheck.  Given return precautions.  Hyperaldosteronism (HCC) Reinforced the need to see endocrinology.  She is working on Health visitor.  If she is unable to get this we could check into patient assistance.  Continue spironolactone.  Insomnia Chronic issue.  Relatively stable.  Suspect depression and anxiety play some kind of role.  Also her chronic pain.  She will continue Ambien as needed.  Refill sent to pharmacy.  Depression Established problem, worsening.  We will place her on Lexapro given Prozac was not terribly beneficial.  She will continue Wellbutrin.  Given return precautions.   Orders Placed This Encounter  Procedures  . Basic Metabolic Panel (BMET)  . HgB A1c    Meds ordered this encounter  Medications  . zolpidem (AMBIEN) 10 MG tablet    Sig: Take 1 tablet (10 mg total) by mouth at bedtime as needed. for sleep    Dispense:  30 tablet    Refill:  1  . escitalopram (LEXAPRO) 10 MG tablet    Sig: Take 1 tablet (10 mg total) by mouth daily.    Dispense:  90 tablet    Refill:  1  . spironolactone (ALDACTONE) 25 MG tablet    Sig: Take 1 tablet (25  mg total) by mouth daily.    Dispense:  90 tablet    Refill:  1     Marikay AlarEric Carolene Gitto, MD [facility]

## 2017-12-03 ENCOUNTER — Other Ambulatory Visit: Payer: Self-pay | Admitting: Internal Medicine

## 2017-12-03 MED ORDER — NITROGLYCERIN 0.4 MG SL SUBL: 0.4000 mg | SUBLINGUAL_TABLET | SUBLINGUAL | 6 refills | Status: DC | PRN

## 2017-12-13 ENCOUNTER — Telehealth: Payer: Self-pay | Admitting: Cardiology

## 2017-12-13 ENCOUNTER — Ambulatory Visit (INDEPENDENT_AMBULATORY_CARE_PROVIDER_SITE_OTHER): Payer: Self-pay | Admitting: *Deleted

## 2017-12-13 DIAGNOSIS — I5022 Chronic systolic (congestive) heart failure: Secondary | ICD-10-CM

## 2017-12-13 DIAGNOSIS — I255 Ischemic cardiomyopathy: Secondary | ICD-10-CM

## 2017-12-13 NOTE — Telephone Encounter (Signed)
LMOVM reminding pt to send remote transmission.   

## 2017-12-14 ENCOUNTER — Encounter: Payer: Self-pay | Admitting: Cardiology

## 2017-12-14 NOTE — Progress Notes (Signed)
Remote ICD transmission.   

## 2017-12-17 LAB — CUP PACEART REMOTE DEVICE CHECK
Battery Remaining Percentage: 93 %
Brady Statistic RV Percent Paced: 1 %
HighPow Impedance: 68 Ohm
HighPow Impedance: 68 Ohm
Implantable Lead Implant Date: 20180906
Implantable Lead Location: 753860
Lead Channel Impedance Value: 360 Ohm
Lead Channel Pacing Threshold Amplitude: 0.75 V
Lead Channel Pacing Threshold Pulse Width: 0.5 ms
Lead Channel Setting Sensing Sensitivity: 0.5 mV
MDC IDC MSMT BATTERY REMAINING LONGEVITY: 96 mo
MDC IDC MSMT BATTERY VOLTAGE: 3.17 V
MDC IDC MSMT LEADCHNL RV SENSING INTR AMPL: 12 mV
MDC IDC PG IMPLANT DT: 20180906
MDC IDC PG SERIAL: 9783396
MDC IDC SESS DTM: 20190321221956
MDC IDC SET LEADCHNL RV PACING AMPLITUDE: 2.5 V
MDC IDC SET LEADCHNL RV PACING PULSEWIDTH: 0.5 ms

## 2017-12-18 NOTE — Progress Notes (Signed)
Follow-up Outpatient Visit Date: 12/19/2017  Primary Care Provider: Glori Luis, MD [address] [place] [address]  Chief Complaint: Follow-up coronary disease and ischemic cardiomyopathy  HPI:  Crystal Roberson is a 52 y.o. year-old female with history of coronary artery disease status post anterior STEMI in 08/2016, ischemic cardiomyopathystatus post ICD(05/2016), hypertension with suspicion for hyperaldosteronism, migraine headaches, degenerative disc disease with chronic back pain, and depression CAD and cardiomyopathy.  I last saw Crystal Roberson in December, who presents for follow-up of, at which time she was feeling okay.  She still noted wide fluctuations in her blood pressure with intermittent low readings and accompanying lightheadedness.  Referral to endocrinology was placed back in October, though it does not appear that this was ever completed due to financial reasons.  Crystal Roberson is appealing her Medicaid denial.  Today, Crystal Roberson reports that she is feeling about the same.  She still has fatigue and exertional dyspnea as well as 1-2 pillow orthopnea.  Her weight has gradually increased though she has not had much in the way of swelling.  Blood pressure typically still runs high at home, though she has episodes of lightheadedness and low blood pressure that can occur anywhere from once a week to a few days a week.  She has not passed out.  She is not exercising regularly.  Crystal Roberson notes occasional vague episodes of chest discomfort that are not reminiscent of what she experienced at the time of her MI.  She has not had any exertional chest pain or palpitations.  At this time, Crystal Roberson is only taking furosemide every other day.  Due to suboptimally controlled blood pressure, Dr. De Nurse increased her spironolactone to 25 mg daily earlier this month.  However, Crystal Roberson typically only takes 12.5 mg in the morning and then another 12.5 mg in the afternoon if her  blood pressure allows.  She continues to have depression.  She was recently switched from fluoxetine to bupropion by Dr. De Nurse.  She has not noticed much difference.  She continues to feel fatigued throughout the day.  She does not sleep well at night.  Her significant other reports that Crystal Roberson snores.  --------------------------------------------------------------------------------------------------  Cardiovascular History & Procedures: Cardiovascular Problems:  Coronary artery disease status post anterior STEMI (09/23/16)  Ischemic cardiomyopathy with severe LV systolic dysfunction  Risk Factors:  Known coronary artery disease, hypertension, and tobacco use  Cath/PCI:  LHC/RHC (04/26/17): LMCA normal. LAD with ostial vasospasm improved to 20% with intracoronary nitroglycerin. Patent stent in mid LAD. Distal LAD shows 50% stenosis, unchanged. There are 20% ostial and 30% mid LCx lesions. Sequential 40% and 20% mid and distal RCA stenoses present. LVEDP 15-18 mmHg. LVEF 30-35% with anterior and apical akinesis. RA 6, RV 32/5, PA 32/12 (19), PCWP 17. AO sat 93%. PA sat 64%. Fick CO/CI 4.8/2.8.  LHC/PCI (09/23/16): Anterior STEMI with 99% mid LAD stenosis with TIMI 2 flow. Mild to moderate, nonobstructive CAD involving the ostial and distal LAD, ostial and mid LCx, and mid/distal RCA. Severely reduced LV contraction (EF 25-35%) with anterior, apical, and apical inferior akinesis. Moderately elevated left ventricular filling pressure (LVEDP 27 mmHg).  CV Surgery:  None  EP Procedures and Devices:  ICD placement (05/31/17,Dr. Camnitz): St. Jude single-chamber deveice  Event monitor (01/2017): Final report pending preliminary review today demonstrates normal sinus rhythm with no arrhythmias during triggered events.  Non-Invasive Evaluation(s):  Limited TTE (01/23/17): Dilated left ventricle with normal wall thickness. LVEF 35-40% with  apical anterior, apical septal, and apical  inferior hypokinesis. Mild to moderate MR. Mild left atrial enlargement. Normal RV size and function.  Transthoracic echocardiogram (11/06/16): LVEF 30-35% with severe hypokinesis of mid/apical septal, anterior, and lateral walls, as well as the apex. Moderate to severe MR. Mild left atrial enlargment. Moderate pulmonary hypertension.  Transthoracic echocardiogram (09/13/16): Normal LV size and contraction (EF 60-65%). Normal diastolic function. Trivial mitral and tricuspid regurgitation. Normal RV size and function.  Recent CV Pertinent Labs: Lab Results  Component Value Date   CHOL 102 03/30/2017   HDL 46.20 03/30/2017   LDLCALC 41 03/30/2017   TRIG 73.0 03/30/2017   CHOLHDL 2 03/30/2017   INR 1.0 04/16/2017   BNP 160.4 (H) 11/26/2016   K 4.0 11/29/2017   K 3.4 (L) 01/04/2012   MG 1.7 11/27/2016   BUN 11 11/29/2017   BUN 15 05/30/2017   BUN 8 01/04/2012   CREATININE 0.81 11/29/2017   CREATININE 0.74 04/16/2017    Past medical and surgical history were reviewed and updated in EPIC.  Current Meds  Medication Sig  . aspirin 81 MG chewable tablet Chew 1 tablet (81 mg total) by mouth daily.  Marland Kitchen atorvastatin (LIPITOR) 40 MG tablet Take 1 tablet (40 mg total) by mouth daily.  Marland Kitchen buPROPion (WELLBUTRIN XL) 150 MG 24 hr tablet TAKE 1 TABLET BY MOUTH ONCE A DAY  . carvedilol (COREG) 3.125 MG tablet Take 1 tablet (3.125 mg total) by mouth 2 (two) times daily.  . clopidogrel (PLAVIX) 75 MG tablet TAKE 1 TABLET BY MOUTH DAILY  . escitalopram (LEXAPRO) 10 MG tablet Take 1 tablet (10 mg total) by mouth daily.  . furosemide (LASIX) 20 MG tablet Take 1 tablet (20 mg total) by mouth every other day.  . lidocaine (LIDODERM) 5 % Place 2-3 patches onto the skin every 12 (twelve) hours as needed (pain). Remove & Discard patch within 12 hours or as directed by MD   . lisinopril (PRINIVIL,ZESTRIL) 2.5 MG tablet Take 1 tablet (2.5 mg total) by mouth 2 (two) times daily. (Patient taking differently: Take  2.5 mg by mouth daily. )  . morphine (MS CONTIN) 15 MG 12 hr tablet Take 15 mg by mouth every 8 (eight) hours.   . nitroGLYCERIN (NITROSTAT) 0.4 MG SL tablet Place 1 tablet (0.4 mg total) under the tongue every 5 (five) minutes as needed for chest pain (CP or SOB).  . potassium chloride SA (K-DUR,KLOR-CON) 20 MEQ tablet Take 1 tablet (20 mEq total) by mouth daily.  Marland Kitchen spironolactone (ALDACTONE) 25 MG tablet Take 1 tablet (25 mg total) by mouth daily. (Patient taking differently: Take 12.5-25 mg by mouth daily. )  . tiZANidine (ZANAFLEX) 4 MG tablet Take 4-8 mg by mouth daily as needed for muscle spasms.  Marland Kitchen zolpidem (AMBIEN) 10 MG tablet Take 1 tablet (10 mg total) by mouth at bedtime as needed. for sleep    Allergies: Pregabalin and Codeine  Social History   Socioeconomic History  . Marital status: Divorced    Spouse name: Not on file  . Number of children: Not on file  . Years of education: Not on file  . Highest education level: Not on file  Occupational History  . Not on file  Social Needs  . Financial resource strain: Not on file  . Food insecurity:    Worry: Not on file    Inability: Not on file  . Transportation needs:    Medical: Not on file    Non-medical: Not  on file  Tobacco Use  . Smoking status: Former Smoker    Packs/day: 0.75    Years: 30.00    Pack years: 22.50    Types: Cigarettes    Last attempt to quit: 09/23/2016    Years since quitting: 1.2  . Smokeless tobacco: Never Used  Substance and Sexual Activity  . Alcohol use: No    Alcohol/week: 0.0 oz  . Drug use: No  . Sexual activity: Not Currently  Lifestyle  . Physical activity:    Days per week: Not on file    Minutes per session: Not on file  . Stress: Not on file  Relationships  . Social connections:    Talks on phone: Not on file    Gets together: Not on file    Attends religious service: Not on file    Active member of club or organization: Not on file    Attends meetings of clubs or  organizations: Not on file    Relationship status: Not on file  . Intimate partner violence:    Fear of current or ex partner: Not on file    Emotionally abused: Not on file    Physically abused: Not on file    Forced sexual activity: Not on file  Other Topics Concern  . Not on file  Social History Narrative  . Not on file    Family History  Problem Relation Age of Onset  . Hypertension Mother   . Arthritis Mother   . Hyperlipidemia Mother   . Mental illness Mother   . Hypertension Maternal Grandmother   . Heart disease Maternal Grandmother     Review of Systems: A 12-system review of systems was performed and was negative except as noted in the HPI.  --------------------------------------------------------------------------------------------------  Physical Exam: BP (!) 158/100 (BP Location: Left Arm, Patient Position: Sitting, Cuff Size: Normal)   Pulse 70   Ht 5\' 5"  (1.651 m)   Wt 163 lb 8 oz (74.2 kg)   BMI 27.21 kg/m   General: NAD. HEENT: No conjunctival pallor or scleral icterus. Moist mucous membranes.  OP clear. Neck: Supple without lymphadenopathy, thyromegaly, JVD, or HJR. Lungs: Normal work of breathing. Clear to auscultation bilaterally without wheezes or crackles. Heart: Regular rate and rhythm without murmurs, rubs, or gallops. Non-displaced PMI. Abd: Bowel sounds present. Soft, NT/ND without hepatosplenomegaly Ext: No lower extremity edema. Radial, PT, and DP pulses are 2+ bilaterally. Skin: Warm and dry without rash.  EKG: Normal sinus rhythm with poor R wave progression and anterolateral T wave inversions.  No significant change since prior tracing on 09/13/17.  Lab Results  Component Value Date   WBC 5.6 05/22/2017   HGB 12.8 05/22/2017   HCT 38.8 05/22/2017   MCV 85 05/22/2017   PLT 191 05/22/2017    Lab Results  Component Value Date   NA 137 11/29/2017   K 4.0 11/29/2017   CL 104 11/29/2017   CO2 28 11/29/2017   BUN 11 11/29/2017    CREATININE 0.81 11/29/2017   GLUCOSE 131 (H) 11/29/2017   ALT 30 04/09/2017    Lab Results  Component Value Date   CHOL 102 03/30/2017   HDL 46.20 03/30/2017   LDLCALC 41 03/30/2017   TRIG 73.0 03/30/2017   CHOLHDL 2 03/30/2017    --------------------------------------------------------------------------------------------------  ASSESSMENT AND PLAN: Coronary artery disease with stable angina Crystal Roberson continues to have episodic atypical chest pain, unchanged from her last visit.  We will continue current doses of carvedilol and  atorvastatin, as well as indefinite dual antiplatelet therapy with aspirin and clopidogrel as tolerated.  Chronic systolic heart failure due to ischemic cardiomyopathy Crystal Roberson weight continues to climb, though she does not have significant edema or JVD on exam today.  I have suggested that she try taking furosemide 20 mg daily for a week and then return back to every other day dosing.  I have also encouraged her to try taking a full 25 mg daily of spironolactone.  We will have her return in a week for a basic metabolic panel.  Due to history of hypotension and lightheadedness, we will defer escalating carvedilol and lisinopril today.  I have encouraged Crystal Roberson to increase her activity as well as to minimize salt intake.  She should continue follow-up with the device clinic and Dr. Elberta Fortisamnitz in regard to her ICD.  Hypertension Blood pressure significantly elevated today, though Crystal Roberson continues to have episodic hypotension at home.  Spironolactone was recently increased by Dr. Birdie SonsSonnenberg, though Crystal Roberson has not been taking a full 25 mg daily on a regular basis.  I encouraged her to do so.  We again discussed endocrinology evaluation given evidence of hyperaldosteronism.  She wishes to defer this until her Medicaid has been approved.  Sodium restriction was encouraged.  Fatigue This is been a chronic problem for Crystal Roberson.  She notes difficulty sleeping  at night as well as considerable daytime somnolence.  Her significant other also reports that Ms. Akkerman snores at night.  I think it would be worthwhile for her to have a sleep evaluation to exclude sleep apnea.  She would like to defer this until after her Medicaid has been approved.  We will readdress this at follow-up.  Follow-up: Return to clinic in 3 months.  Yvonne Kendallhristopher Tyler Robidoux, MD 12/19/2017 8:10 AM

## 2017-12-19 ENCOUNTER — Encounter: Payer: Self-pay | Admitting: Internal Medicine

## 2017-12-19 ENCOUNTER — Ambulatory Visit: Payer: Self-pay | Admitting: Internal Medicine

## 2017-12-19 VITALS — BP 158/100 | HR 70 | Ht 65.0 in | Wt 163.5 lb

## 2017-12-19 DIAGNOSIS — I25118 Atherosclerotic heart disease of native coronary artery with other forms of angina pectoris: Secondary | ICD-10-CM

## 2017-12-19 DIAGNOSIS — I255 Ischemic cardiomyopathy: Secondary | ICD-10-CM

## 2017-12-19 DIAGNOSIS — R5382 Chronic fatigue, unspecified: Secondary | ICD-10-CM

## 2017-12-19 DIAGNOSIS — I152 Hypertension secondary to endocrine disorders: Secondary | ICD-10-CM

## 2017-12-19 DIAGNOSIS — I5022 Chronic systolic (congestive) heart failure: Secondary | ICD-10-CM

## 2017-12-19 MED ORDER — SPIRONOLACTONE 25 MG PO TABS: 12.5000 mg | ORAL_TABLET | Freq: Two times a day (BID) | ORAL | 2 refills | Status: DC

## 2017-12-19 MED ORDER — FUROSEMIDE 20 MG PO TABS: ORAL_TABLET | ORAL | 3 refills | Status: DC

## 2017-12-19 NOTE — Patient Instructions (Signed)
Medication Instructions:  Your physician has recommended you make the following change in your medication:  1- TAKE Furosemide 20 mg (1 tablet) by mouth once a day for 1 week, then take 20 mg (1 tablet) by mouth every other day. 2- TAKE Spironolactone 12.5 mg (1/2 tablet) by mouth two times a day.   Labwork: Your physician recommends that you return for lab work in: 1 WEEK ON December 26, 2017 AT THE MEDICAL MALL.  - [facility]. You will check in at the front desk to the right as you walk into the atrium. Valet Parking is offered if needed.    Testing/Procedures: none  Follow-Up: Your physician recommends that you schedule a follow-up appointment in: 3 MONTHS WITH DR END.  If you need a refill on your cardiac medications before your next appointment, please call your pharmacy.

## 2018-01-21 ENCOUNTER — Emergency Department
Admission: EM | Admit: 2018-01-21 | Discharge: 2018-01-21 | Disposition: A | Discharge status: Home / Self Care | Payer: Self-pay | Attending: Emergency Medicine | Admitting: Emergency Medicine

## 2018-01-21 ENCOUNTER — Other Ambulatory Visit: Payer: Self-pay

## 2018-01-21 ENCOUNTER — Encounter: Payer: Self-pay | Admitting: Emergency Medicine

## 2018-01-21 ENCOUNTER — Telehealth: Payer: Self-pay | Admitting: Family Medicine

## 2018-01-21 ENCOUNTER — Telehealth: Payer: Self-pay

## 2018-01-21 DIAGNOSIS — F419 Anxiety disorder, unspecified: Secondary | ICD-10-CM | POA: Insufficient documentation

## 2018-01-21 DIAGNOSIS — Z79899 Other long term (current) drug therapy: Secondary | ICD-10-CM | POA: Insufficient documentation

## 2018-01-21 DIAGNOSIS — I10 Essential (primary) hypertension: Secondary | ICD-10-CM

## 2018-01-21 DIAGNOSIS — I5022 Chronic systolic (congestive) heart failure: Secondary | ICD-10-CM | POA: Insufficient documentation

## 2018-01-21 DIAGNOSIS — Z7982 Long term (current) use of aspirin: Secondary | ICD-10-CM | POA: Insufficient documentation

## 2018-01-21 DIAGNOSIS — I110 Hypertensive heart disease with heart failure: Secondary | ICD-10-CM | POA: Insufficient documentation

## 2018-01-21 DIAGNOSIS — Z87891 Personal history of nicotine dependence: Secondary | ICD-10-CM | POA: Insufficient documentation

## 2018-01-21 DIAGNOSIS — I2510 Atherosclerotic heart disease of native coronary artery without angina pectoris: Secondary | ICD-10-CM | POA: Insufficient documentation

## 2018-01-21 DIAGNOSIS — F329 Major depressive disorder, single episode, unspecified: Secondary | ICD-10-CM | POA: Insufficient documentation

## 2018-01-21 DIAGNOSIS — Z7902 Long term (current) use of antithrombotics/antiplatelets: Secondary | ICD-10-CM | POA: Insufficient documentation

## 2018-01-21 LAB — BASIC METABOLIC PANEL
ANION GAP: 8 (ref 5–15)
BUN: 12 mg/dL (ref 6–20)
CALCIUM: 9.1 mg/dL (ref 8.9–10.3)
CO2: 23 mmol/L (ref 22–32)
Chloride: 106 mmol/L (ref 101–111)
Creatinine, Ser: 0.78 mg/dL (ref 0.44–1.00)
GFR calc Af Amer: >60 mL/min (ref >60)
GLUCOSE: 122 mg/dL — AB (ref 65–99)
Potassium: 4.1 mmol/L (ref 3.5–5.1)
SODIUM: 137 mmol/L (ref 135–145)

## 2018-01-21 LAB — CBC WITH DIFFERENTIAL/PLATELET
BASOS ABS: 0.1 10*3/uL (ref 0–0.1)
Basophils Relative: 1 %
EOS ABS: 0 10*3/uL (ref 0–0.7)
EOS PCT: 1 %
HCT: 42.7 % (ref 35.0–47.0)
Hemoglobin: 14.9 g/dL (ref 12.0–16.0)
LYMPHS PCT: 20 %
Lymphs Abs: 1.4 10*3/uL (ref 1.0–3.6)
MCH: 30.4 pg (ref 26.0–34.0)
MCHC: 34.8 g/dL (ref 32.0–36.0)
MCV: 87.2 fL (ref 80.0–100.0)
MONO ABS: 0.3 10*3/uL (ref 0.2–0.9)
Monocytes Relative: 4 %
Neutro Abs: 5.3 10*3/uL (ref 1.4–6.5)
Neutrophils Relative %: 74 %
PLATELETS: 183 10*3/uL (ref 150–440)
RBC: 4.89 MIL/uL (ref 3.80–5.20)
RDW: 12.8 % (ref 11.5–14.5)
WBC: 7 10*3/uL (ref 3.6–11.0)

## 2018-01-21 MED ORDER — CARVEDILOL 6.25 MG PO TABS: 6.2500 mg | ORAL_TABLET | Freq: Two times a day (BID) | ORAL | 0 refills | Status: DC

## 2018-01-21 NOTE — Telephone Encounter (Signed)
Patient cam to clinic today for BP reading attained at Pain management of 209/124, patient arrived at office with BP 210/124 left arm pulse 77 . Patient c/o feeling little foggy but says this is normal for her. Denies numbness, weakness , chest pain or headache.Made PCP aware , patient has taken coreg 3.125 mg lisinopril 2.5 mg and spironolactone 25 mg at 7:30 am. PCP advised patient would need to go to ED. sSignificant other contacted to drive patient to ER , nurse in room with patient until patient d/c to ER.

## 2018-01-21 NOTE — ED Triage Notes (Signed)
Pt here for HTN. Went to pain clinic for routine appt and was 209/124.  He called her pcp and they report it was high so they sent her here.  Reports felt ok this morning and no new symptoms r/t bp being elevated.  Has had her bp meds.  Has had problems controlling bp ever since her heart attack 2017; it will shoot up high then drop.

## 2018-01-21 NOTE — Telephone Encounter (Signed)
Copied from CRM [id number]. Topic: General - Other >> Jan 21, 2018  8:59 AM Maia Petties wrote: Reason for CRM: received call from [facility] that Dr. Ronita Hipps was sending pt to the office immediately due to BP in his office this morning of 209/118 and 209/124. Pt has already left and on the way to [facility] [employer].

## 2018-01-21 NOTE — ED Provider Notes (Signed)
[facility] Emergency Department Provider Note   ____________________________________________    I have reviewed the triage vital signs and the nursing notes.   HISTORY  Chief Complaint Hypertension     HPI Crystal Roberson is a 52 y.o. female who presents with high blood pressure.  Patient reports she has chronic back pain and went to her pain specialist today who became alarmed her blood pressure being over 200 systolic and sent her to her PCP who then sent her to the emergency department because it remained elevated.  Patient reports compliance with blood pressure medications.  No abdominal pain no chest pain or shortness of breath no headache.  Feels well currently   Past Medical History:  Diagnosis Date  . Allergy   . Chicken pox   . Chronic back pain   . DDD (degenerative disc disease), lumbar   . Depression   . Heart attack (HCC)   . Hypertension   . Migraines   . Syncope 09/12/2016    Patient Active Problem List   Diagnosis Date Noted  . Chronic fatigue 12/19/2017  . Hyperaldosteronism (HCC) 08/24/2017  . Arthralgia 05/07/2017  . Ischemic cardiomyopathy 04/14/2017  . Hyperlipidemia LDL goal <70 04/14/2017  . Syncope 03/30/2017  . Coronary artery disease 02/10/2017  . Chronic systolic heart failure (HCC) 11/26/2016  . Anxiety 01/13/2016  . Chronic pain 11/07/2015  . Hypertension 11/07/2015  . Insomnia 11/07/2015  . Depression 11/07/2015  . Facet syndrome, lumbar 03/11/2015  . Lumbar radiculopathy 03/11/2015  . Bilateral occipital neuralgia 02/07/2015  . 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 DDD (degenerative disc disease), lumbosacral 01/28/2015  . 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 CARDIAC CATHETERIZATION N/A 09/23/2016   Procedure: Left Heart Cath and Coronary Angiography;  Surgeon: Yvonne Kendall, MD;  Location: [facility] INVASIVE CV LAB;  Service: Cardiovascular;   Laterality: N/A;  . CARDIAC CATHETERIZATION N/A 09/23/2016   Procedure: Coronary Stent Intervention;  Surgeon: Yvonne Kendall, MD;  Location: [facility] INVASIVE CV LAB;  Service: Cardiovascular;  Laterality: N/A;  . CERVICAL DISC SURGERY    . ICD IMPLANT N/A 05/31/2017   Procedure: ICD Implant;  Surgeon: Regan Lemming, MD;  Location: [facility] INVASIVE CV LAB;  Service: Cardiovascular;  Laterality: N/A;  . RIGHT/LEFT HEART CATH AND CORONARY ANGIOGRAPHY N/A 04/26/2017   Procedure: Right/Left Heart Cath and Coronary Angiography;  Surgeon: Yvonne Kendall, MD;  Location: [facility] INVASIVE CV LAB;  Service: Cardiovascular;  Laterality: N/A;  . TUBAL LIGATION    . ULTRASOUND GUIDANCE FOR VASCULAR ACCESS  04/26/2017   Procedure: Ultrasound Guidance For Vascular Access;  Surgeon: Yvonne Kendall, MD;  Location: [facility] INVASIVE CV LAB;  Service: Cardiovascular;;    Prior to Admission medications   Medication Sig Start Date End Date Taking? Authorizing Provider  aspirin 81 MG chewable tablet Chew 1 tablet (81 mg total) by mouth daily. 09/27/16   Bhagat, Sharrell Ku, PA  atorvastatin (LIPITOR) 40 MG tablet Take 1 tablet (40 mg total) by mouth daily. 06/28/17 12/19/17  End, Cristal Deer, MD  buPROPion (WELLBUTRIN XL) 150 MG 24 hr tablet TAKE 1 TABLET BY MOUTH ONCE A DAY 07/25/17   Glori Luis, MD  carvedilol (COREG) 6.25 MG tablet Take 1 tablet (6.25 mg total) by mouth 2 (two) times daily. 01/21/18   Jene Every, MD  clopidogrel (PLAVIX) 75 MG tablet TAKE 1 TABLET BY MOUTH DAILY 09/07/17   End, Cristal Deer, MD  escitalopram (LEXAPRO) 10 MG tablet  Take 1 tablet (10 mg total) by mouth daily. 11/29/17   Glori Luis, MD  furosemide (LASIX) 20 MG tablet Take 1 tablet (20 mg) by mouth every day for 1 week, then take 1 tablet (20 mg) by mouth every other day. 12/19/17   End, Cristal Deer, MD  lidocaine (LIDODERM) 5 % Place 2-3 patches onto the skin every 12 (twelve) hours as needed (pain). Remove & Discard patch within 12  hours or as directed by MD     [provider]  lisinopril (PRINIVIL,ZESTRIL) 2.5 MG tablet Take 1 tablet (2.5 mg total) by mouth 2 (two) times daily. Patient taking differently: Take 2.5 mg by mouth daily.  05/17/17 12/19/17  End, Cristal Deer, MD  morphine (MS CONTIN) 15 MG 12 hr tablet Take 15 mg by mouth every 8 (eight) hours.     [provider]  nitroGLYCERIN (NITROSTAT) 0.4 MG SL tablet Place 1 tablet (0.4 mg total) under the tongue every 5 (five) minutes as needed for chest pain (CP or SOB). 12/03/17   End, Cristal Deer, MD  potassium chloride SA (K-DUR,KLOR-CON) 20 MEQ tablet Take 1 tablet (20 mEq total) by mouth daily. 04/26/17   End, Cristal Deer, MD  spironolactone (ALDACTONE) 25 MG tablet Take 0.5 tablets (12.5 mg total) by mouth 2 (two) times daily. 12/19/17   End, Cristal Deer, MD  tiZANidine (ZANAFLEX) 4 MG tablet Take 4-8 mg by mouth daily as needed for muscle spasms.    [provider]  zolpidem (AMBIEN) 10 MG tablet Take 1 tablet (10 mg total) by mouth at bedtime as needed. for sleep 11/29/17   Glori Luis, MD     Allergies Pregabalin and Codeine  Family History  Problem Relation Age of Onset  . Hypertension Mother   . Arthritis Mother   . Hyperlipidemia Mother   . Mental illness Mother   . Hypertension Maternal Grandmother   . Heart disease Maternal Grandmother     Social History Social History   Tobacco Use  . Smoking status: Former Smoker    Packs/day: 0.75    Years: 30.00    Pack years: 22.50    Types: Cigarettes    Last attempt to quit: 09/23/2016    Years since quitting: 1.3  . Smokeless tobacco: Never Used  Substance Use Topics  . Alcohol use: No    Alcohol/week: 0.0 oz  . Drug use: No    Review of Systems  Constitutional: No fever/chills Eyes: No visual changes.  ENT: No neck pain Cardiovascular: Denies chest pain. Respiratory: Denies shortness of breath. Gastrointestinal: No abdominal pain Genitourinary: Negative for  dysuria. Musculoskeletal: Negative for back pain. Skin: Negative for rash. Neurological: Negative for headaches    ____________________________________________   PHYSICAL EXAM:  VITAL SIGNS: ED Triage Vitals  Enc Vitals Group     BP 01/21/18 1008 (!) 157/105     Pulse Rate 01/21/18 1008 85     Resp 01/21/18 1008 18     Temp 01/21/18 1007 97.8 F (36.6 C)     Temp Source 01/21/18 1007 Oral     SpO2 01/21/18 1008 99 %     Weight 01/21/18 1008 73.9 kg (163 lb)     Height 01/21/18 1008 1.651 m ( )     Head Circumference --      Peak Flow --      Pain Score 01/21/18 1007 5     Pain Loc --      Pain Edu? --      Excl.  in GC? --     Constitutional: Alert and oriented. No acute distress. Pleasant and interactive Eyes: Conjunctivae are normal.   Nose: No congestion/rhinnorhea. Mouth/Throat: Mucous membranes are moist.    Cardiovascular: Normal rate, regular rhythm.  Good peripheral circulation. Respiratory: Normal respiratory effort.  No retractions. Lungs CTAB. Gastrointestinal: Soft and nontender. No distention.  No CVA tenderness.  Musculoskeletal: No lower extremity tenderness nor edema.  Warm and well perfused Neurologic:  Normal speech and language. No gross focal neurologic deficits are appreciated.  Skin:  Skin is warm, dry and intact. No rash noted. Psychiatric: Mood and affect are normal. Speech and behavior are normal.  ____________________________________________   LABS (all labs ordered are listed, but only abnormal results are displayed)  Labs Reviewed  BASIC METABOLIC PANEL - Abnormal; Notable for the following components:      Result Value   Glucose, Bld 122 (*)    All other components within normal limits  CBC WITH DIFFERENTIAL/PLATELET   ____________________________________________  EKG  None ____________________________________________  RADIOLOGY  None ____________________________________________   PROCEDURES  Procedure(s)  performed: No  Procedures   Critical Care performed: No ____________________________________________   INITIAL IMPRESSION / ASSESSMENT AND PLAN / ED COURSE  Pertinent labs & imaging results that were available during my care of the patient were reviewed by me and considered in my medical decision making (see chart for details).  Patient presents with reports of elevated blood pressure, blood pressure is improved without intervention, systolic 157 in the emergency department.  Reviewed patient's blood pressure medications did not discussed with patient we will increase Coreg and have her follow-up with PCP for further management    ____________________________________________   FINAL CLINICAL IMPRESSION(S) / ED DIAGNOSES  Final diagnoses:  Essential hypertension        Note:  This document was prepared using Dragon voice recognition software and may include unintentional dictation errors.    Jene Every, MD 01/21/18 1534

## 2018-01-21 NOTE — Telephone Encounter (Signed)
See telephone note.

## 2018-01-21 NOTE — Telephone Encounter (Signed)
Patient seen in the office today for elevated blood pressure.  She noted she feels a little foggy.  No numbness, weakness, chest pain, or headaches.  Cardiac exam with regular rate and rhythm with no abnormal heart sounds.  Clear to auscultation of lungs.  Given significantly elevated BP and history of MI she was advised to have ED evaluation.  Discussed EMS transport though she opted against this and opted for somebody to drive her.

## 2018-01-21 NOTE — Telephone Encounter (Signed)
Patient significant other arrived , notified ER patient coming by car vital signs given BP 210/124 pulse 77, 02 sats @ 98 in office.

## 2018-01-22 ENCOUNTER — Other Ambulatory Visit: Payer: Self-pay | Admitting: Family Medicine

## 2018-01-26 ENCOUNTER — Other Ambulatory Visit: Payer: Self-pay | Admitting: Internal Medicine

## 2018-02-05 ENCOUNTER — Other Ambulatory Visit: Payer: Self-pay | Admitting: Internal Medicine

## 2018-02-05 NOTE — Telephone Encounter (Signed)
Please review for refill. Dr. Elberta Fortis gave the patient Magnesium & the medication is no longer on patient's med list.

## 2018-02-05 NOTE — Telephone Encounter (Signed)
Pharmacy requesting refill, but medication not on list. Please address

## 2018-02-08 NOTE — Telephone Encounter (Signed)
Called patient but did not get an answer. 

## 2018-02-08 NOTE — Telephone Encounter (Signed)
Dr. Elberta Fortis did not start this medication.  Not sure if this patient should be taking this supplement.  Patient should discuss need for this with their PCP.

## 2018-02-08 NOTE — Telephone Encounter (Signed)
Attempted again to reach patient but was unsuccessful. 

## 2018-02-11 NOTE — Telephone Encounter (Signed)
Spoke with patient and made her aware that she should contact her pcp in regards to this medication. She verbalized her understanding and thanked me for the call.

## 2018-02-27 ENCOUNTER — Other Ambulatory Visit: Payer: Self-pay | Admitting: Internal Medicine

## 2018-03-12 ENCOUNTER — Encounter: Payer: Self-pay | Admitting: Nurse Practitioner

## 2018-03-12 ENCOUNTER — Ambulatory Visit: Payer: Self-pay | Admitting: Nurse Practitioner

## 2018-03-12 VITALS — BP 146/86 | HR 76 | Ht 65.0 in | Wt 158.5 lb

## 2018-03-12 DIAGNOSIS — E785 Hyperlipidemia, unspecified: Secondary | ICD-10-CM

## 2018-03-12 DIAGNOSIS — I5022 Chronic systolic (congestive) heart failure: Secondary | ICD-10-CM

## 2018-03-12 DIAGNOSIS — I255 Ischemic cardiomyopathy: Secondary | ICD-10-CM

## 2018-03-12 DIAGNOSIS — I2510 Atherosclerotic heart disease of native coronary artery without angina pectoris: Secondary | ICD-10-CM

## 2018-03-12 DIAGNOSIS — I10 Essential (primary) hypertension: Secondary | ICD-10-CM

## 2018-03-12 NOTE — Progress Notes (Signed)
Office Visit    Patient Name: Crystal Roberson Date of Encounter: 03/12/2018  Primary Care Provider:  Glori Luis, MD Primary Cardiologist:  Yvonne Kendall, MD  Chief Complaint    52 year old female with a history of CAD status post prior LAD stenting, ischemic cardia myopathy, HFrEF, status post single lead ICD, hypertension, hyperlipidemia, and chronic fatigue, who presents for follow-up.  Past Medical History    Past Medical History:  Diagnosis Date  . Allergy   . CAD (coronary artery disease)    a. 08/2016 Ant STEMI/PCI: LAD 61m (2.25x20 Promus Premier DES); b. 04/2017 Cath: LM nl, LAD 20ost, 70ost->vasospasm, Patent mid stent, 50d, D1/2/3 small, LCX 20ost, 77m, OM1/2/3 small, RCA 76m, 20d, EF 30-35%, mild to mod MR.  . Chicken pox   . Chronic back pain   . Chronic fatigue   . DDD (degenerative disc disease), lumbar   . Depression   . HFrEF (heart failure with reduced ejection fraction) (HCC)    a. 01/2017 Echo: Ef 35-40%, septal apical, distal inf, anterior HK. Mod dil LV. Mild to mod MR. mildly dil LA; b. 04/2017 LV Gram: EF 30-35%.  . Hyperlipidemia LDL goal <70   . Hypertension   . Ischemic cardiomyopathy    a. 01/2017 Echo: EF 35-40%; b. 04/2017 LV Gram: EF 30-35%; c. 05/2017 s/p SJM [id number] Ellipse VR RV only AICD.  . Migraines   . Syncope 09/12/2016   Past Surgical History:  Procedure Laterality Date  . CARDIAC CATHETERIZATION N/A 09/23/2016   Procedure: Left Heart Cath and Coronary Angiography;  Surgeon: Yvonne Kendall, MD;  Location: [facility] INVASIVE CV LAB;  Service: Cardiovascular;  Laterality: N/A;  . CARDIAC CATHETERIZATION N/A 09/23/2016   Procedure: Coronary Stent Intervention;  Surgeon: Yvonne Kendall, MD;  Location: [facility] INVASIVE CV LAB;  Service: Cardiovascular;  Laterality: N/A;  . CERVICAL DISC SURGERY    . ICD IMPLANT N/A 05/31/2017   Procedure: ICD Implant;  Surgeon: Regan Lemming, MD;  Location: [facility] INVASIVE CV LAB;  Service: Cardiovascular;   Laterality: N/A;  . RIGHT/LEFT HEART CATH AND CORONARY ANGIOGRAPHY N/A 04/26/2017   Procedure: Right/Left Heart Cath and Coronary Angiography;  Surgeon: Yvonne Kendall, MD;  Location: [facility] INVASIVE CV LAB;  Service: Cardiovascular;  Laterality: N/A;  . TUBAL LIGATION    . ULTRASOUND GUIDANCE FOR VASCULAR ACCESS  04/26/2017   Procedure: Ultrasound Guidance For Vascular Access;  Surgeon: Yvonne Kendall, MD;  Location: [facility] INVASIVE CV LAB;  Service: Cardiovascular;;    Allergies  Allergies  Allergen Reactions  . Pregabalin Shortness Of Breath, Swelling, Palpitations and Other (See Comments)    Other reaction(s): Tachycardia (finding)  . Codeine Nausea And Vomiting    History of Present Illness    52 year old female with the above complex past medical history including coronary artery disease status post anterior ST segment elevation myocardial infarction in December 2017 requiring drug-eluting stent placement to the LAD.  She otherwise had nonobstructive disease.  She says currently suffered from LV dysfunction and ischemic cardiomyopathy.  EF had improved to 35 to 40% by echo in May 2018 but repeat catheterization in August 2018, which showed stable coronary anatomy, also showed an EF of 30 to 35%.  In that setting, she was referred to EP and subsequently underwent single lead ICD placement in September 2018.  Other history includes hypertension, hyperlipidemia, HFrEF, and chronic fatigue.  She was last seen in clinic in late March, at which time she reported intermittent atypical chest discomfort, ongoing fatigue,  daytime somnolence, and variable blood pressures.  Sleep study was recommended however she deferred in the setting of lack of insurance.  She was subsequently seen by primary care in late April secondary to markedly elevated blood pressures and was referred to the emergency department.  Lab work there was within normal limits.  Her carvedilol was titrated to 6.25 twice daily and she was  discharged home.  Since then, blood pressure has been better at home.  She is continues to have swings with highs now in the 140s and low was sometimes in the 60s, but  this is occurring less frequently.  She has chronic dyspnea on exertion which is unchanged, along with 2 pillow orthopnea.  She continues have intermittent chest discomfort that typically occurs at rest, a few times a month, lasts a few minutes, and resolve spontaneously.  She has not been having any exertional chest pain.  She continues to have fatigue.  She denies PND, palpitations, presyncope, syncope, edema, or early satiety.  Home Medications    Prior to Admission medications   Medication Sig Start Date End Date Taking? Authorizing Provider  aspirin 81 MG chewable tablet Chew 1 tablet (81 mg total) by mouth daily. 09/27/16  Yes Bhagat, Bhavinkumar, PA  buPROPion (WELLBUTRIN XL) 150 MG 24 hr tablet TAKE 1 TABLET BY MOUTH ONCE A DAY 01/22/18  Yes Glori Luis, MD  carvedilol (COREG) 6.25 MG tablet Take 1 tablet (6.25 mg total) by mouth 2 (two) times daily. 01/21/18  Yes Jene Every, MD  clopidogrel (PLAVIX) 75 MG tablet TAKE 1 TABLET BY MOUTH ONCE DAILY 02/27/18  Yes End, Cristal Deer, MD  escitalopram (LEXAPRO) 10 MG tablet Take 1 tablet (10 mg total) by mouth daily. 11/29/17  Yes Glori Luis, MD  furosemide (LASIX) 20 MG tablet Take 1 tablet (20 mg) by mouth every day for 1 week, then take 1 tablet (20 mg) by mouth every other day. 12/19/17  Yes End, Cristal Deer, MD  lidocaine (LIDODERM) 5 % Place 2-3 patches onto the skin every 12 (twelve) hours as needed (pain). Remove & Discard patch within 12 hours or as directed by MD    Yes [provider]  morphine (MS CONTIN) 15 MG 12 hr tablet Take 15 mg by mouth every 8 (eight) hours.    Yes [provider]  nitroGLYCERIN (NITROSTAT) 0.4 MG SL tablet Place 1 tablet (0.4 mg total) under the tongue every 5 (five) minutes as needed for chest pain (CP or SOB). 12/03/17   Yes End, Cristal Deer, MD  potassium chloride SA (K-DUR,KLOR-CON) 20 MEQ tablet Take 1 tablet (20 mEq total) by mouth daily. 04/26/17  Yes End, Cristal Deer, MD  tiZANidine (ZANAFLEX) 4 MG tablet Take 4-8 mg by mouth daily as needed for muscle spasms.   Yes [provider]  zolpidem (AMBIEN) 10 MG tablet Take 1 tablet (10 mg total) by mouth at bedtime as needed. for sleep 11/29/17  Yes Glori Luis, MD  atorvastatin (LIPITOR) 40 MG tablet Take 1 tablet (40 mg total) by mouth daily. 06/28/17 12/19/17  End, Cristal Deer, MD  lisinopril (PRINIVIL,ZESTRIL) 2.5 MG tablet Take 1 tablet (2.5 mg total) by mouth 2 (two) times daily. Patient taking differently: Take 2.5 mg by mouth daily.  05/17/17 12/19/17  End, Cristal Deer, MD    Review of Systems    Ongoing fatigue with intermittent, brief episodes of chest discomfort at rest.  Continues to have some variability to blood pressure though overall improved.  She is chronic dyspnea on  exertion as well as 2 pillow orthopnea.  These are unchanged.  She denies palpitations, PND, dizziness, syncope, edema, or early satiety..  All other systems reviewed and are otherwise negative except as noted above.  Physical Exam    VS:  BP (!) 146/86 (BP Location: Left Arm, Patient Position: Sitting, Cuff Size: Normal)   Pulse 76   Ht 5\' 5"  (1.651 m)   Wt 158 lb 8 oz (71.9 kg)   BMI 26.38 kg/m  , BMI Body mass index is 26.38 kg/m. GEN: Well nourished, well developed, in no acute distress.  HEENT: normal.  Neck: Supple, no JVD, carotid bruits, or masses. Cardiac: RRR, no murmurs, rubs, or gallops. No clubbing, cyanosis, edema.  Radials/DP/PT 2+ and equal bilaterally.  Respiratory:  Respirations regular and unlabored, clear to auscultation bilaterally. GI: Soft, nontender, nondistended, BS + x 4. MS: no deformity or atrophy. Skin: warm and dry, no rash. Neuro:  Strength and sensation are intact. Psych: Normal affect.  Accessory Clinical Findings    ECG  -regular sinus rhythm, 76, prior septal infarct, mild lateral ST depression with T changes.  Overall unchanged.  Assessment & Plan    1.  Coronary artery disease: Status post prior anterior STEMI in December 2017 with drug-eluting stent placement to the LAD.  Stent was patent on catheterization August 2018.  She had then and continues to have intermittent atypical chest discomfort a few times a month, typically occurring at rest, lasting a few minutes, resolving spontaneously.  She has not been having any exertional  chest pain, though she does have chronic dyspnea on exertion since her MI.  She remains on aspirin, statin, beta-blocker, Plavix, ACE inhibitor therapy.  2.  Ischemic cardiomyopathy/HFrEF: Status post single lead ICD in September 2018.  Weight has been relatively stable since her last visit.  She is currently tolerating Lasix 20 mg daily and is euvolemic on exam today.  Beta-blocker dose was recently increased in late April in the setting of elevated blood pressure for which she was seen in the ED.  She has tolerated this without any significant hypotension, though she continues to have intermittent spells of lightheadedness.  She otherwise remains on low-dose ACE inhibitor and full dose spironolactone.  I will not titrate her medications today given history of lightheadedness and prior swings in blood pressure with documented hypotension on her home blood pressure cuff.  3.  Essential hypertension: Blood pressure elevated today though overall this sounds like this has been more stable than previous.  She has a history of swings in blood pressure with marked elevations as well as periods of hypotension and lightheadedness.  Given prior history of hypotension with ongoing intermittent lightheadedness, I am reluctant to titrate her medications today.  She remains on beta-blocker,  ACE inhibitor, Lasix, spironolactone.  4.  Hyperlipidemia: LDL was 41 last July.  LFTs were normal at that time.   Continue statin therapy.  5.  Chronic fatigue:  Accompanied by daytime somnolence.  She snores at night.  She is not currently interested in sleep study as she is still working out her insurance.  6.  Disposition: Patient stable since last visit.  Follow-up in 3 months or sooner if necessary.   Nicolasa Duckinghristopher Harith Mccadden, NP 03/12/2018, 1:49 PM

## 2018-03-12 NOTE — Patient Instructions (Signed)
Medication Instructions: - Your physician recommends that you continue on your current medications as directed. Please refer to the Current Medication list given to you today.  Labwork: - none ordered  Procedures/Testing: - none ordered  Follow-Up: Your physician recommends that you schedule a follow-up appointment in: 3 months with Dr. End.   Any Additional Special Instructions Will Be Listed Below (If Applicable).     If you need a refill on your cardiac medications before your next appointment, please call your pharmacy.   

## 2018-03-14 ENCOUNTER — Telehealth: Payer: Self-pay | Admitting: Cardiology

## 2018-03-14 ENCOUNTER — Ambulatory Visit (INDEPENDENT_AMBULATORY_CARE_PROVIDER_SITE_OTHER): Payer: Self-pay | Admitting: *Deleted

## 2018-03-14 DIAGNOSIS — I255 Ischemic cardiomyopathy: Secondary | ICD-10-CM

## 2018-03-14 DIAGNOSIS — I5022 Chronic systolic (congestive) heart failure: Secondary | ICD-10-CM

## 2018-03-14 NOTE — Telephone Encounter (Signed)
LMOVM reminding pt to send remote transmission.   

## 2018-03-15 NOTE — Progress Notes (Signed)
Remote ICD transmission.   

## 2018-04-02 LAB — CUP PACEART REMOTE DEVICE CHECK
Battery Voltage: 3.1 V
Date Time Interrogation Session: 20190620205500
HIGH POWER IMPEDANCE MEASURED VALUE: 78 Ohm
HighPow Impedance: 78 Ohm
Implantable Lead Location: 753860
Lead Channel Pacing Threshold Pulse Width: 0.5 ms
Lead Channel Setting Pacing Amplitude: 2.5 V
Lead Channel Setting Pacing Pulse Width: 0.5 ms
MDC IDC LEAD IMPLANT DT: 20180906
MDC IDC MSMT BATTERY REMAINING LONGEVITY: 94 mo
MDC IDC MSMT BATTERY REMAINING PERCENTAGE: 92 %
MDC IDC MSMT LEADCHNL RV IMPEDANCE VALUE: 360 Ohm
MDC IDC MSMT LEADCHNL RV PACING THRESHOLD AMPLITUDE: 0.75 V
MDC IDC MSMT LEADCHNL RV SENSING INTR AMPL: 12 mV
MDC IDC PG IMPLANT DT: 20180906
MDC IDC SET LEADCHNL RV SENSING SENSITIVITY: 0.5 mV
MDC IDC STAT BRADY RV PERCENT PACED: 1 %
Pulse Gen Serial Number: 9783396

## 2018-04-16 ENCOUNTER — Other Ambulatory Visit: Payer: Self-pay | Admitting: Internal Medicine

## 2018-06-12 ENCOUNTER — Ambulatory Visit: Payer: Self-pay | Admitting: Internal Medicine

## 2018-06-12 ENCOUNTER — Telehealth: Payer: Self-pay | Admitting: Internal Medicine

## 2018-06-12 NOTE — Telephone Encounter (Signed)
Error

## 2018-06-13 ENCOUNTER — Ambulatory Visit: Payer: Self-pay | Admitting: *Deleted

## 2018-06-13 ENCOUNTER — Telehealth: Payer: Self-pay | Admitting: Cardiology

## 2018-06-13 NOTE — Telephone Encounter (Signed)
LMOVM reminding pt to send remote transmission.   

## 2018-06-14 ENCOUNTER — Ambulatory Visit (INDEPENDENT_AMBULATORY_CARE_PROVIDER_SITE_OTHER): Payer: Self-pay | Admitting: *Deleted

## 2018-06-14 ENCOUNTER — Encounter: Payer: Self-pay | Admitting: Cardiology

## 2018-06-14 DIAGNOSIS — I255 Ischemic cardiomyopathy: Secondary | ICD-10-CM

## 2018-06-14 DIAGNOSIS — I5022 Chronic systolic (congestive) heart failure: Secondary | ICD-10-CM

## 2018-06-18 ENCOUNTER — Encounter: Payer: Self-pay | Admitting: Cardiology

## 2018-06-18 NOTE — Progress Notes (Signed)
Remote ICD transmission.   

## 2018-06-26 LAB — CUP PACEART REMOTE DEVICE CHECK
Battery Remaining Longevity: 92 mo
Battery Remaining Percentage: 89 %
Battery Voltage: 3.07 V
HIGH POWER IMPEDANCE MEASURED VALUE: 79 Ohm
HighPow Impedance: 79 Ohm
Implantable Pulse Generator Implant Date: 20180906
Lead Channel Impedance Value: 350 Ohm
Lead Channel Pacing Threshold Pulse Width: 0.5 ms
Lead Channel Sensing Intrinsic Amplitude: 12 mV
Lead Channel Setting Pacing Amplitude: 2.5 V
Lead Channel Setting Pacing Pulse Width: 0.5 ms
Lead Channel Setting Sensing Sensitivity: 0.5 mV
MDC IDC LEAD IMPLANT DT: 20180906
MDC IDC LEAD LOCATION: 753860
MDC IDC MSMT LEADCHNL RV PACING THRESHOLD AMPLITUDE: 0.75 V
MDC IDC PG SERIAL: 9783396
MDC IDC SESS DTM: 20190921012511
MDC IDC STAT BRADY RV PERCENT PACED: 1 %

## 2018-07-04 ENCOUNTER — Ambulatory Visit: Payer: Self-pay | Admitting: Internal Medicine

## 2018-07-04 ENCOUNTER — Telehealth: Payer: Self-pay | Admitting: *Deleted

## 2018-07-04 ENCOUNTER — Encounter: Payer: Self-pay | Admitting: Internal Medicine

## 2018-07-04 VITALS — BP 160/100 | HR 68 | Ht 65.0 in | Wt 166.5 lb

## 2018-07-04 DIAGNOSIS — E785 Hyperlipidemia, unspecified: Secondary | ICD-10-CM

## 2018-07-04 DIAGNOSIS — Z79899 Other long term (current) drug therapy: Secondary | ICD-10-CM

## 2018-07-04 DIAGNOSIS — I152 Hypertension secondary to endocrine disorders: Secondary | ICD-10-CM

## 2018-07-04 DIAGNOSIS — I5022 Chronic systolic (congestive) heart failure: Secondary | ICD-10-CM

## 2018-07-04 DIAGNOSIS — I10 Essential (primary) hypertension: Secondary | ICD-10-CM

## 2018-07-04 DIAGNOSIS — I2510 Atherosclerotic heart disease of native coronary artery without angina pectoris: Secondary | ICD-10-CM

## 2018-07-04 MED ORDER — LISINOPRIL 5 MG PO TABS: 5.0000 mg | ORAL_TABLET | Freq: Every day | ORAL | 3 refills | Status: DC

## 2018-07-04 NOTE — Telephone Encounter (Signed)
Patient called back and scheduler let patient know she needed to be fasting for the appointment coming up for lab work. Patient verbalized understanding.

## 2018-07-04 NOTE — Telephone Encounter (Signed)
After patient left appointment, Dr End ordered for patient to have Fasting Lipid panel and change BMET to CMET when patient comes in for BP check and lab work in 2 weeks.   Attempted to reach patient to let her know. No answer. Left message to call back.

## 2018-07-04 NOTE — Patient Instructions (Signed)
Medication Instructions:  Your physician has recommended you make the following change in your medication:  1- CHANGE Lisinopril to 5 mg by mouth once a day.  If you need a refill on your cardiac medications before your next appointment, please call your pharmacy.   Lab work: Your physician recommends that you return for lab work in: TODAY - BMET.   Your physician recommends that you return for lab work in: 2 WEEKS IN THE OFFICE ALONG WITH BLOOD PRESSURE CHECK. - BMET.  If you have labs (blood work) drawn today and your tests are completely normal, you will receive your results only by: Marland Kitchen MyChart Message (if you have MyChart) OR . A paper copy in the mail If you have any lab test that is abnormal or we need to change your treatment, we will call you to review the results.  Testing/Procedures: none  Follow-Up: At [facility], you and your health needs are our priority.  As part of our continuing mission to provide you with exceptional heart care, we have created designated Provider Care Teams.  These Care Teams include your primary Cardiologist (physician) and Advanced Practice Providers (APPs -  Physician Assistants and Nurse Practitioners) who all work together to provide you with the care you need, when you need it.   Your physician recommends that you schedule a follow-up appointment in: 2 WEEKS FOR BLOOD PRESSURE CHECK AND LAB WORK.    You will need a follow up appointment in 3 months.  Please call our office 2 months in advance to schedule this appointment.  You may see Yvonne Kendall, MD or one of the following Advanced Practice Providers on your designated Care Team:   Nicolasa Ducking, NP Eula Listen, PA-C . Marisue Ivan, PA-C

## 2018-07-04 NOTE — Progress Notes (Signed)
Follow-up Outpatient Visit Date: 07/04/2018  Primary Care Provider: Glori Luis, MD [address] [place] [address]  Chief Complaint: Follow-up coronary artery disease and heart failure  HPI:  Crystal Roberson is a 52 y.o. year-old female with history of coronary artery disease status post anterior STEMI in 08/2016, ischemic cardiomyopathystatus post ICD(05/2016), hypertension with suspicion for hyperaldosteronism, migraine headaches, degenerative disc disease with chronic back pain, and depression , who presents for follow-up of coronary artery disease and cardiomyopathy.  She was last seen in our office by Ward Givens, NP, in June.  At that time, she noted continued fluctuations in her blood pressure, albeit improved after escalation of carvedilol.  Intermittent atypical chest pain and chronic exertional dyspnea were unchanged.  No medication changes were made at that time.  Today, Crystal Roberson reports feeling relatively well.  She is still bothered by back pain but notes that her leg discomfort has improved.  She has been able to take her dog out for a walk on occasion.  She notes rare episodes of central chest tightness that are not exertional and resolve spontaneously after a few minutes.  Overall, frequency has improved since her last visit.  She has minimal orthopnea and no PND.  She notes occasional leg edema when she has been walking or standing for extended periods.  She is only using furosemide as needed.  Her weight has been up and down but overall seems to have trended up given dietary indiscretion.  She denies palpitations.  Crystal Roberson notes that her blood pressure remains labile.  She went almost a month without any low blood pressures and accompanying lightheadedness, though she has again noted a couple of episodes more recently.  She seems to be tolerating increased dose of carvedilol well.  She notes that she is only taking lisinopril 2.5 mg daily (previously  prescribed as 2.5 mg twice daily).  She remains on aspirin and clopidogrel without significant bleeding.  --------------------------------------------------------------------------------------------------  Cardiovascular History & Procedures: Cardiovascular Problems:  Coronary artery disease status post anterior STEMI (09/23/16)  Ischemic cardiomyopathy with severe LV systolic dysfunction  Risk Factors:  Known coronary artery disease, hypertension, and tobacco use  Cath/PCI:  LHC/RHC (04/26/17): LMCA normal. LAD with ostial vasospasm improved to 20% with intracoronary nitroglycerin. Patent stent in mid LAD. Distal LAD shows 50% stenosis, unchanged. There are 20% ostial and 30% mid LCx lesions. Sequential 40% and 20% mid and distal RCA stenoses present. LVEDP 15-18 mmHg. LVEF 30-35% with anterior and apical akinesis. RA 6, RV 32/5, PA 32/12 (19), PCWP 17. AO sat 93%. PA sat 64%. Fick CO/CI 4.8/2.8.  LHC/PCI (09/23/16): Anterior STEMI with 99% mid LAD stenosis with TIMI 2 flow. Mild to moderate, nonobstructive CAD involving the ostial and distal LAD, ostial and mid LCx, and mid/distal RCA. Severely reduced LV contraction (EF 25-35%) with anterior, apical, and apical inferior akinesis. Moderately elevated left ventricular filling pressure (LVEDP 27 mmHg).  CV Surgery:  None  EP Procedures and Devices:  ICD placement (05/31/17,Dr. Camnitz): St. Jude single-chamber deveice  Event monitor (01/2017): Final report pending preliminary review today demonstrates normal sinus rhythm with no arrhythmias during triggered events.  Non-Invasive Evaluation(s):  Limited TTE (01/23/17): Dilated left ventricle with normal wall thickness. LVEF 35-40% with apical anterior, apical septal, and apical inferior hypokinesis. Mild to moderate MR. Mild left atrial enlargement. Normal RV size and function.  Transthoracic echocardiogram (11/06/16): LVEF 30-35% with severe hypokinesis of mid/apical septal,  anterior, and lateral walls, as well as  the apex. Moderate to severe MR. Mild left atrial enlargment. Moderate pulmonary hypertension.  Transthoracic echocardiogram (09/13/16): Normal LV size and contraction (EF 60-65%). Normal diastolic function. Trivial mitral and tricuspid regurgitation. Normal RV size and function.  Recent CV Pertinent Labs: Lab Results  Component Value Date   CHOL 102 03/30/2017   HDL 46.20 03/30/2017   LDLCALC 41 03/30/2017   TRIG 73.0 03/30/2017   CHOLHDL 2 03/30/2017   INR 1.0 04/16/2017   BNP 160.4 (H) 11/26/2016   K 4.1 01/21/2018   K 3.4 (L) 01/04/2012   MG 1.7 11/27/2016   BUN 12 01/21/2018   BUN 15 05/30/2017   BUN 8 01/04/2012   CREATININE 0.78 01/21/2018   CREATININE 0.74 04/16/2017    Past medical and surgical history were reviewed and updated in EPIC.  Current Meds  Medication Sig  . aspirin 81 MG chewable tablet Chew 1 tablet (81 mg total) by mouth daily.  Marland Kitchen atorvastatin (LIPITOR) 40 MG tablet Take 1 tablet (40 mg total) by mouth daily.  Marland Kitchen buPROPion (WELLBUTRIN XL) 150 MG 24 hr tablet TAKE 1 TABLET BY MOUTH ONCE A DAY  . carvedilol (COREG) 6.25 MG tablet Take 1 tablet (6.25 mg total) by mouth 2 (two) times daily.  . clopidogrel (PLAVIX) 75 MG tablet TAKE 1 TABLET BY MOUTH ONCE DAILY  . escitalopram (LEXAPRO) 10 MG tablet Take 1 tablet (10 mg total) by mouth daily.  . furosemide (LASIX) 20 MG tablet Take 1 tablet (20 mg) by mouth every day for 1 week, then take 1 tablet (20 mg) by mouth every other day. (Patient taking differently: Take 20 mg by mouth daily as needed. Take 1 tablet (20 mg) by mouth every day for 1 week, then take 1 tablet (20 mg) by mouth every other day.)  . lidocaine (LIDODERM) 5 % Place 2-3 patches onto the skin every 12 (twelve) hours as needed (pain). Remove & Discard patch within 12 hours or as directed by MD   . morphine (MS CONTIN) 15 MG 12 hr tablet Take 15 mg by mouth every 8 (eight) hours.   . nitroGLYCERIN (NITROSTAT)  0.4 MG SL tablet Place 1 tablet (0.4 mg total) under the tongue every 5 (five) minutes as needed for chest pain (CP or SOB).  . potassium chloride SA (K-DUR,KLOR-CON) 20 MEQ tablet Take 1 tablet (20 mEq total) by mouth daily.  . potassium chloride SA (K-DUR,KLOR-CON) 20 MEQ tablet TAKE 1 TABLET BY MOUTH ONCE DAILY *IF YOU TAKE LASIX TAKE AN EXTRA DOSE AS DIRECTED*  . spironolactone (ALDACTONE) 25 MG tablet Take 1 tablet (25 mg) by mouth once daily  . tiZANidine (ZANAFLEX) 4 MG tablet Take 4-8 mg by mouth daily as needed for muscle spasms.  Marland Kitchen zolpidem (AMBIEN) 10 MG tablet Take 1 tablet (10 mg total) by mouth at bedtime as needed. for sleep  . [DISCONTINUED] lisinopril (PRINIVIL,ZESTRIL) 2.5 MG tablet Take 1 tablet (2.5 mg total) by mouth 2 (two) times daily. (Patient taking differently: Take 2.5 mg by mouth daily. )    Allergies: Pregabalin and Codeine  Social History   Tobacco Use  . Smoking status: Former Smoker    Packs/day: 0.75    Years: 30.00    Pack years: 22.50    Types: Cigarettes    Last attempt to quit: 09/23/2016    Years since quitting: 1.7  . Smokeless tobacco: Never Used  Substance Use Topics  . Alcohol use: No    Alcohol/week: 0.0 standard drinks  . Drug  use: No    Family History  Problem Relation Age of Onset  . Hypertension Mother   . Arthritis Mother   . Hyperlipidemia Mother   . Mental illness Mother   . Hypertension Maternal Grandmother   . Heart disease Maternal Grandmother     Review of Systems: A 12-system review of systems was performed and was negative except as noted in the HPI.  --------------------------------------------------------------------------------------------------  Physical Exam: BP (!) 160/100 (BP Location: Left Arm, Patient Position: Sitting, Cuff Size: Normal)   Pulse 68   Ht 5\' 5"  (1.651 m)   Wt 166 lb 8 oz (75.5 kg)   BMI 27.71 kg/m   General: NAD. HEENT: No conjunctival pallor or scleral icterus. Moist mucous membranes.   OP clear. Neck: Supple without lymphadenopathy, thyromegaly, JVD, or HJR. No carotid bruit. Lungs: Normal work of breathing. Clear to auscultation bilaterally without wheezes or crackles. Heart: Regular rate and rhythm without murmurs, rubs, or gallops. Non-displaced PMI. Abd: Bowel sounds present. Soft, NT/ND without hepatosplenomegaly Ext: No lower extremity edema. Skin: Warm and dry without rash.  EKG: Normal sinus rhythm with septal Q waves and anterolateral T wave inversions.  No significant change from prior tracings.  Lab Results  Component Value Date   WBC 7.0 01/21/2018   HGB 14.9 01/21/2018   HCT 42.7 01/21/2018   MCV 87.2 01/21/2018   PLT 183 01/21/2018    Lab Results  Component Value Date   NA 137 01/21/2018   K 4.1 01/21/2018   CL 106 01/21/2018   CO2 23 01/21/2018   BUN 12 01/21/2018   CREATININE 0.78 01/21/2018   GLUCOSE 122 (H) 01/21/2018   ALT 30 04/09/2017    Lab Results  Component Value Date   CHOL 102 03/30/2017   HDL 46.20 03/30/2017   LDLCALC 41 03/30/2017   TRIG 73.0 03/30/2017   CHOLHDL 2 03/30/2017    --------------------------------------------------------------------------------------------------  ASSESSMENT AND PLAN: Coronary artery disease without angina Occasional nonexertional chest tightness is nonspecific.  This has been long-standing and actually improved from some past visits.  Continue current medications for secondary prevention including indefinite DAPT.  Chronic systolic heart failure Patient appears euvolemic with NYHA class II-III heart failure symptoms.  We will increase lisinopril to 5 mg daily.  Continue current doses of carvedilol and spironolactone as well as prn furosemide.  Continue ICD follow-up through the device clinic.  Hypertension Blood pressure remains elevated today.  Crystal Roberson reports less labile readings at home, though she still has occasional drops with lightheadedness.  We have agreed to increase  lisinopril to 5 mg daily.  I will check a basic metabolic panel today.  We will check a CMP and blood pressure again in 2 weeks.  Hyperlipidemia Patient is on atorvastatin 40 mg daily.  Last lipid panel was over a year ago.  We will recheck a fasting lipid panel and CMP when she returns for blood work in 2 weeks.  Follow-up: Return to clinic in 3 months with me or APP.  Yvonne Kendall, MD 07/04/2018 8:58 AM

## 2018-07-05 LAB — BASIC METABOLIC PANEL
BUN / CREAT RATIO: 15 (ref 9–23)
BUN: 16 mg/dL (ref 6–24)
CO2: 22 mmol/L (ref 20–29)
CREATININE: 1.05 mg/dL — AB (ref 0.57–1.00)
Calcium: 9 mg/dL (ref 8.7–10.2)
Chloride: 103 mmol/L (ref 96–106)
GFR calc Af Amer: 70 mL/min/{1.73_m2} (ref >59)
GFR calc non Af Amer: 60 mL/min/{1.73_m2} (ref >59)
Glucose: 103 mg/dL — ABNORMAL HIGH (ref 65–99)
Potassium: 4 mmol/L (ref 3.5–5.2)
Sodium: 140 mmol/L (ref 134–144)

## 2018-07-18 ENCOUNTER — Ambulatory Visit: Payer: Self-pay

## 2018-07-18 ENCOUNTER — Other Ambulatory Visit: Payer: Self-pay

## 2018-08-01 ENCOUNTER — Other Ambulatory Visit: Payer: Self-pay | Admitting: Internal Medicine

## 2018-08-10 ENCOUNTER — Other Ambulatory Visit: Payer: Self-pay | Admitting: Family Medicine

## 2018-09-16 ENCOUNTER — Ambulatory Visit (INDEPENDENT_AMBULATORY_CARE_PROVIDER_SITE_OTHER): Payer: Self-pay

## 2018-09-16 DIAGNOSIS — I5022 Chronic systolic (congestive) heart failure: Secondary | ICD-10-CM

## 2018-09-16 DIAGNOSIS — I10 Essential (primary) hypertension: Secondary | ICD-10-CM

## 2018-09-17 LAB — CUP PACEART REMOTE DEVICE CHECK
Battery Remaining Longevity: 90 mo
Battery Remaining Percentage: 87 %
Date Time Interrogation Session: 20191224095905
HIGH POWER IMPEDANCE MEASURED VALUE: 69 Ohm
HighPow Impedance: 69 Ohm
Implantable Lead Implant Date: 20180906
Lead Channel Impedance Value: 350 Ohm
Lead Channel Pacing Threshold Amplitude: 0.75 V
Lead Channel Pacing Threshold Pulse Width: 0.5 ms
Lead Channel Setting Pacing Amplitude: 2.5 V
Lead Channel Setting Pacing Pulse Width: 0.5 ms
MDC IDC LEAD LOCATION: 753860
MDC IDC MSMT BATTERY VOLTAGE: 3.02 V
MDC IDC MSMT LEADCHNL RV SENSING INTR AMPL: 12 mV
MDC IDC PG IMPLANT DT: 20180906
MDC IDC SET LEADCHNL RV SENSING SENSITIVITY: 0.5 mV
MDC IDC STAT BRADY RV PERCENT PACED: 1 %
Pulse Gen Serial Number: 9783396

## 2018-09-17 NOTE — Progress Notes (Signed)
Remote ICD transmission.   

## 2018-10-09 ENCOUNTER — Encounter: Payer: Self-pay | Admitting: Internal Medicine

## 2018-10-09 ENCOUNTER — Ambulatory Visit: Payer: Self-pay | Admitting: Internal Medicine

## 2018-10-09 VITALS — BP 132/82 | HR 70 | Ht 65.0 in | Wt 165.0 lb

## 2018-10-09 DIAGNOSIS — E785 Hyperlipidemia, unspecified: Secondary | ICD-10-CM

## 2018-10-09 DIAGNOSIS — I255 Ischemic cardiomyopathy: Secondary | ICD-10-CM

## 2018-10-09 DIAGNOSIS — I25118 Atherosclerotic heart disease of native coronary artery with other forms of angina pectoris: Secondary | ICD-10-CM

## 2018-10-09 DIAGNOSIS — Z79899 Other long term (current) drug therapy: Secondary | ICD-10-CM

## 2018-10-09 DIAGNOSIS — I5022 Chronic systolic (congestive) heart failure: Secondary | ICD-10-CM

## 2018-10-09 DIAGNOSIS — I10 Essential (primary) hypertension: Secondary | ICD-10-CM

## 2018-10-09 MED ORDER — LISINOPRIL 5 MG PO TABS: 5.0000 mg | ORAL_TABLET | Freq: Two times a day (BID) | ORAL | 3 refills | Status: DC

## 2018-10-09 NOTE — Patient Instructions (Signed)
Medication Instructions:  Your physician has recommended you make the following change in your medication:  1- INCREASE Lisinopril to 5 mg by mouth two times a day.   If you need a refill on your cardiac medications before your next appointment, please call your pharmacy.   Lab work: Your physician recommends that you return for lab work in: TODAY - CMET, LIPID.   Your physician recommends that you return for lab work in: 2 WEEKS ON 10/23/2018 TO CHECK BMET. - Please go to the [facility]. You will check in at the front desk to the right as you walk into the atrium. Valet Parking is offered if needed.   If you have labs (blood work) drawn today and your tests are completely normal, you will receive your results only by: Marland Kitchen MyChart Message (if you have MyChart) OR . A paper copy in the mail If you have any lab test that is abnormal or we need to change your treatment, we will call you to review the results.  Testing/Procedures: NONE  Follow-Up: At [facility], you and your health needs are our priority.  As part of our continuing mission to provide you with exceptional heart care, we have created designated Provider Care Teams.  These Care Teams include your primary Cardiologist (physician) and Advanced Practice Providers (APPs -  Physician Assistants and Nurse Practitioners) who all work together to provide you with the care you need, when you need it. You will need a follow up appointment in 3-4 months.  Please call our office 2 months in advance to schedule this appointment.  You may see Yvonne Kendall, MD or one of the following Advanced Practice Providers on your designated Care Team:   Nicolasa Ducking, NP Eula Listen, PA-C . Marisue Ivan, PA-C

## 2018-10-09 NOTE — Progress Notes (Signed)
Follow-up Outpatient Visit Date: 10/09/2018  Primary Care Provider: Glori Luis, MD [address] [place] [address]  Chief Complaint: Follow-up CAD and cardiomyopathy.  HPI:  Crystal Roberson is a 53 y.o. year-old female with history of coronary artery disease status post anterior STEMI in 08/2016, chronic systolic heart failure due to ischemic cardiomyopathystatus post ICD(05/2016), hypertensionwith suspicion for hyperaldosteronism, migraine headaches, degenerative disc disease with chronic back pain, and depression, who presents for follow-up of CAD and cardiomyopathy.  I last saw her in October, at which time she was doing well other than continued back pain.  BP remained somewhat labile with intermittent hypotension and lightheadedness (longstanding issue).  In the setting of elevated BP at several visits, we agreed to increase lisinopril to 5 mg daily.  Today, Crystal Roberson reports feeling about the same as at our last visit.  She notes that her episodes of lightheadedness and low blood pressure have been less frequent.  However, over the last 4 to 6 weeks, she has experienced occasional clamminess with slight lightheadedness and nausea.  Episodes usually last a couple of minutes and are unrelated to certain activity.  She has not had any palpitations.  Exertional dyspnea and 1-2 pillow orthopnea are stable.  Lower extremity edema has actually improved since our last visit.  She notes occasional brief episodes of chest tightness that have been unchanged since our last visit.  Discomfort is not exertional, often occurring when she lies down at night.  She had been exercising prior to Christmas, though this has been on hold as she and several family members had viral infections over the holidays.  She would like to begin walking again, particularly since her back pain has been better  controlled.  --------------------------------------------------------------------------------------------------  Cardiovascular History & Procedures: Cardiovascular Problems:  Coronary artery disease status post anterior STEMI (09/23/16)  Ischemic cardiomyopathy with severe LV systolic dysfunction  Risk Factors:  Known coronary artery disease, hypertension, and tobacco use  Cath/PCI:  LHC/RHC (04/26/17): LMCA normal. LAD with ostial vasospasm improved to 20% with intracoronary nitroglycerin. Patent stent in mid LAD. Distal LAD shows 50% stenosis, unchanged. There are 20% ostial and 30% mid LCx lesions. Sequential 40% and 20% mid and distal RCA stenoses present. LVEDP 15-18 mmHg. LVEF 30-35% with anterior and apical akinesis. RA 6, RV 32/5, PA 32/12 (19), PCWP 17. AO sat 93%. PA sat 64%. Fick CO/CI 4.8/2.8.  LHC/PCI (09/23/16): Anterior STEMI with 99% mid LAD stenosis with TIMI 2 flow. Mild to moderate, nonobstructive CAD involving the ostial and distal LAD, ostial and mid LCx, and mid/distal RCA. Severely reduced LV contraction (EF 25-35%) with anterior, apical, and apical inferior akinesis. Moderately elevated left ventricular filling pressure (LVEDP 27 mmHg).  CV Surgery:  None  EP Procedures and Devices:  ICD placement (05/31/17,Dr. Camnitz): St. Jude single-chamber deveice  Event monitor (01/2017): Final report pending preliminary review today demonstrates normal sinus rhythm with no arrhythmias during triggered events.  Non-Invasive Evaluation(s):  Limited TTE (01/23/17): Dilated left ventricle with normal wall thickness. LVEF 35-40% with apical anterior, apical septal, and apical inferior hypokinesis. Mild to moderate MR. Mild left atrial enlargement. Normal RV size and function.  Transthoracic echocardiogram (11/06/16): LVEF 30-35% with severe hypokinesis of mid/apical septal, anterior, and lateral walls, as well as the apex. Moderate to severe MR. Mild left atrial enlargment.  Moderate pulmonary hypertension.  Transthoracic echocardiogram (09/13/16): Normal LV size and contraction (EF 60-65%). Normal diastolic function. Trivial mitral and tricuspid regurgitation. Normal RV size and function.  Recent CV Pertinent Labs: Lab Results  Component Value Date   CHOL 102 03/30/2017   HDL 46.20 03/30/2017   LDLCALC 41 03/30/2017   TRIG 73.0 03/30/2017   CHOLHDL 2 03/30/2017   INR 1.0 04/16/2017   BNP 160.4 (H) 11/26/2016   K 4.0 07/04/2018   K 3.4 (L) 01/04/2012   MG 1.7 11/27/2016   BUN 16 07/04/2018   BUN 8 01/04/2012   CREATININE 1.05 (H) 07/04/2018   CREATININE 0.74 04/16/2017    Past medical and surgical history were reviewed and updated in EPIC.  Current Meds  Medication Sig  . aspirin 81 MG chewable tablet Chew 1 tablet (81 mg total) by mouth daily.  Marland Kitchen atorvastatin (LIPITOR) 40 MG tablet Take 1 tablet (40 mg total) by mouth daily.  Marland Kitchen buPROPion (WELLBUTRIN XL) 150 MG 24 hr tablet TAKE 1 TABLET BY MOUTH ONCE A DAY  . carvedilol (COREG) 6.25 MG tablet Take 1 tablet (6.25 mg total) by mouth 2 (two) times daily.  . clopidogrel (PLAVIX) 75 MG tablet TAKE 1 TABLET BY MOUTH ONCE A DAY  . furosemide (LASIX) 20 MG tablet Take 1 tablet (20 mg) by mouth every day for 1 week, then take 1 tablet (20 mg) by mouth every other day. (Patient taking differently: Take 20 mg by mouth daily as needed. Take 1 tablet (20 mg) by mouth every day for 1 week, then take 1 tablet (20 mg) by mouth every other day.)  . lidocaine (LIDODERM) 5 % Place 2-3 patches onto the skin every 12 (twelve) hours as needed (pain). Remove & Discard patch within 12 hours or as directed by MD   . lisinopril (PRINIVIL,ZESTRIL) 5 MG tablet Take 1 tablet (5 mg total) by mouth daily.  Marland Kitchen morphine (MS CONTIN) 15 MG 12 hr tablet Take 15 mg by mouth every 8 (eight) hours.   . nitroGLYCERIN (NITROSTAT) 0.4 MG SL tablet Place 1 tablet (0.4 mg total) under the tongue every 5 (five) minutes as needed for chest pain  (CP or SOB).  . potassium chloride SA (K-DUR,KLOR-CON) 20 MEQ tablet Take 1 tablet (20 mEq total) by mouth daily.  . potassium chloride SA (K-DUR,KLOR-CON) 20 MEQ tablet TAKE 1 TABLET BY MOUTH ONCE DAILY *IF YOU TAKE LASIX TAKE AN EXTRA DOSE AS DIRECTED*  . spironolactone (ALDACTONE) 25 MG tablet Take 1 tablet (25 mg) by mouth once daily  . tiZANidine (ZANAFLEX) 4 MG tablet Take 4-8 mg by mouth daily as needed for muscle spasms.  Marland Kitchen zolpidem (AMBIEN) 10 MG tablet Take 1 tablet (10 mg total) by mouth at bedtime as needed. for sleep    Allergies: Pregabalin and Codeine  Social History   Tobacco Use  . Smoking status: Former Smoker    Packs/day: 0.75    Years: 30.00    Pack years: 22.50    Types: Cigarettes    Last attempt to quit: 09/23/2016    Years since quitting: 2.0  . Smokeless tobacco: Never Used  Substance Use Topics  . Alcohol use: No    Alcohol/week: 0.0 standard drinks  . Drug use: No    Family History  Problem Relation Age of Onset  . Hypertension Mother   . Arthritis Mother   . Hyperlipidemia Mother   . Mental illness Mother   . Hypertension Maternal Grandmother   . Heart disease Maternal Grandmother     Review of Systems: A 12-system review of systems was performed and was negative except as noted in the HPI.  --------------------------------------------------------------------------------------------------  Physical Exam: BP 132/82 (BP Location: Left Arm, Patient Position: Sitting, Cuff Size: Normal)   Pulse 70   Ht 5\' 5"  (1.651 m)   Wt 165 lb (74.8 kg)   BMI 27.46 kg/m   General: NAD. HEENT: No conjunctival pallor or scleral icterus. Moist mucous membranes.  OP clear. Neck: Supple without lymphadenopathy, thyromegaly, JVD, or HJR. Lungs: Normal work of breathing. Clear to auscultation bilaterally without wheezes or crackles. Heart: Regular rate and rhythm without murmurs, rubs, or gallops. Non-displaced PMI. Abd: Bowel sounds present. Soft, NT/ND  without hepatosplenomegaly Ext: No lower extremity edema. Skin: Warm and dry without rash.  EKG: Normal sinus rhythm with septal Q waves.  Lab Results  Component Value Date   WBC 7.0 01/21/2018   HGB 14.9 01/21/2018   HCT 42.7 01/21/2018   MCV 87.2 01/21/2018   PLT 183 01/21/2018    Lab Results  Component Value Date   NA 140 07/04/2018   K 4.0 07/04/2018   CL 103 07/04/2018   CO2 22 07/04/2018   BUN 16 07/04/2018   CREATININE 1.05 (H) 07/04/2018   GLUCOSE 103 (H) 07/04/2018   ALT 30 04/09/2017    Lab Results  Component Value Date   CHOL 102 03/30/2017   HDL 46.20 03/30/2017   LDLCALC 41 03/30/2017   TRIG 73.0 03/30/2017   CHOLHDL 2 03/30/2017    --------------------------------------------------------------------------------------------------  ASSESSMENT AND PLAN: Coronary artery disease with stable angina Intermittent nonexertional chest pain is nonspecific and overall stable from prior visits.  We will defer further ischemia evaluation at this time.  Continue carvedilol for secondary prevention and antianginal therapy, as well as indefinite dual antiplatelet therapy with aspirin and clopidogrel.  Chronic systolic heart failure secondary to ischemic cardiomyopathy Crystal Roberson appears euvolemic with NYHA class II heart failure symptoms.  I am unsure what to make of her transient nausea, clamminess, and lightheadedness.  These may reflect vasovagal episodes.  Overall, her lightheadedness with soft blood pressures has improved.  I have personally reviewed her most recent ICD interrogation from 08/2018, which showed normal device function and no arrhythmia.  She tolerated escalation of lisinopril at our last visit.  We will therefore increase this to 5 mg twice daily.  Continue current doses of spironolactone and carvedilol.  I will check a CMP today as well as a BMP in 2 weeks to ensure stable renal function and potassium.  Hypertension Blood pressure upper normal today but  improved from prior visits.  As above, we will increase lisinopril to 5 mg twice daily.  Continue current doses of carvedilol and spironolactone.  I will check renal function and electrolytes today and again in 2 weeks.  Hyperlipidemia Tolerating atorvastatin 40 mg daily well.  It has been more than a year since the most recent lipid panel.  I will check a lipid panel and CMP today.  Follow-up: Return to clinic in 3 to 4 months.  Crystal Kendallhristopher Kathye Cipriani, MD 10/09/2018 9:11 AM

## 2018-10-11 LAB — LIPID PANEL
Chol/HDL Ratio: 2.5 ratio (ref 0.0–4.4)
Cholesterol, Total: 152 mg/dL (ref 100–199)
HDL: 61 mg/dL (ref >39)
LDL Calculated: 72 mg/dL (ref 0–99)
Triglycerides: 95 mg/dL (ref 0–149)
VLDL CHOLESTEROL CAL: 19 mg/dL (ref 5–40)

## 2018-10-11 LAB — COMPREHENSIVE METABOLIC PANEL
A/G RATIO: 1.7 (ref 1.2–2.2)
ALBUMIN: 4.4 g/dL (ref 3.5–5.5)
ALK PHOS: 159 IU/L — AB (ref 39–117)
ALT: 14 IU/L (ref 0–32)
AST: 15 IU/L (ref 0–40)
BILIRUBIN TOTAL: 0.6 mg/dL (ref 0.0–1.2)
BUN / CREAT RATIO: 13 (ref 9–23)
BUN: 12 mg/dL (ref 6–24)
CHLORIDE: 105 mmol/L (ref 96–106)
CO2: 19 mmol/L — AB (ref 20–29)
Calcium: 9.2 mg/dL (ref 8.7–10.2)
Creatinine, Ser: 0.93 mg/dL (ref 0.57–1.00)
GFR calc non Af Amer: 70 mL/min/{1.73_m2} (ref >59)
GFR, EST AFRICAN AMERICAN: 81 mL/min/{1.73_m2} (ref >59)
GLOBULIN, TOTAL: 2.6 g/dL (ref 1.5–4.5)
GLUCOSE: 102 mg/dL — AB (ref 65–99)
POTASSIUM: 4.4 mmol/L (ref 3.5–5.2)
SODIUM: 142 mmol/L (ref 134–144)
TOTAL PROTEIN: 7 g/dL (ref 6.0–8.5)

## 2018-10-11 LAB — SPECIMEN STATUS REPORT

## 2018-10-16 ENCOUNTER — Telehealth: Payer: Self-pay | Admitting: *Deleted

## 2018-10-16 NOTE — Telephone Encounter (Signed)
-----   Message from Yvonne Kendall, MD sent at 10/10/2018  7:44 PM EST ----- Please let Crystal Roberson know that her renal function and electrolytes are normal.  Alkaline phosphatase is mildly elevated, which is non-specific.  She should discuss this further with her PCP to see if additional testing is needed.  LDL has risen significantly since last year and is now a little above goal.  Please confirm that she is taking atorvastatin 40 mg daily.  If so, we should increase it to 80 mg daily.  If not, I encourage her to take it regularly.  We should repeat a lipid panel in ~3 months.

## 2018-10-16 NOTE — Telephone Encounter (Signed)
Results released to My Chart. No answer. Left message to call back.   

## 2018-10-17 NOTE — Telephone Encounter (Signed)
No answer. Left message to call back.   

## 2018-10-23 ENCOUNTER — Encounter: Payer: Self-pay | Admitting: *Deleted

## 2018-10-23 NOTE — Telephone Encounter (Signed)
No answer. Left message to call back.  Third attempt to reach patient and had to leave a message. Letter with results and to contact office mailed to patient.

## 2018-11-13 ENCOUNTER — Telehealth: Payer: Self-pay | Admitting: *Deleted

## 2018-11-13 NOTE — Telephone Encounter (Signed)
No answer. Left detailed message, ok per DPR, as a friendly reminder [facility] for lab work and to call back if any further questions.

## 2018-12-16 ENCOUNTER — Ambulatory Visit (INDEPENDENT_AMBULATORY_CARE_PROVIDER_SITE_OTHER): Payer: Self-pay | Admitting: *Deleted

## 2018-12-16 ENCOUNTER — Other Ambulatory Visit: Payer: Self-pay

## 2018-12-16 DIAGNOSIS — I255 Ischemic cardiomyopathy: Secondary | ICD-10-CM

## 2018-12-16 DIAGNOSIS — I5022 Chronic systolic (congestive) heart failure: Secondary | ICD-10-CM

## 2018-12-16 LAB — CUP PACEART REMOTE DEVICE CHECK
Battery Voltage: 3.01 V
Brady Statistic RV Percent Paced: 1 %
HighPow Impedance: 70 Ohm
HighPow Impedance: 70 Ohm
Implantable Lead Location: 753860
Lead Channel Pacing Threshold Amplitude: 0.75 V
Lead Channel Sensing Intrinsic Amplitude: 12 mV
Lead Channel Setting Pacing Amplitude: 2.5 V
Lead Channel Setting Sensing Sensitivity: 0.5 mV
MDC IDC LEAD IMPLANT DT: 20180906
MDC IDC MSMT BATTERY REMAINING LONGEVITY: 88 mo
MDC IDC MSMT BATTERY REMAINING PERCENTAGE: 85 %
MDC IDC MSMT LEADCHNL RV IMPEDANCE VALUE: 360 Ohm
MDC IDC MSMT LEADCHNL RV PACING THRESHOLD PULSEWIDTH: 0.5 ms
MDC IDC PG IMPLANT DT: 20180906
MDC IDC PG SERIAL: 9783396
MDC IDC SESS DTM: 20200323103317
MDC IDC SET LEADCHNL RV PACING PULSEWIDTH: 0.5 ms

## 2018-12-20 ENCOUNTER — Other Ambulatory Visit: Payer: Self-pay | Admitting: Internal Medicine

## 2018-12-24 NOTE — Progress Notes (Signed)
Remote ICD transmission.   

## 2018-12-27 ENCOUNTER — Telehealth: Payer: Self-pay | Admitting: Internal Medicine

## 2018-12-27 NOTE — Telephone Encounter (Signed)
Virtual Visit Pre-Appointment Phone Call  Steps For Call:  1. Confirm consent - "In the setting of the current Covid19 crisis, you are scheduled for a (phone or video) visit with your provider on (date) at (time).  Just as we do with many in-office visits, in order for you to participate in this visit, we must obtain consent.  If you'd like, I can send this to your mychart (if signed up) or email for you to review.  Otherwise, I can obtain your verbal consent now.  All virtual visits are billed to your insurance company just like a normal visit would be.  By agreeing to a virtual visit, we'd like you to understand that the technology does not allow for your provider to perform an examination, and thus may limit your provider's ability to fully assess your condition.  Finally, though the technology is pretty good, we cannot assure that it will always work on either your or our end, and in the setting of a video visit, we may have to convert it to a phone-only visit.  In either situation, we cannot ensure that we have a secure connection.  Are you willing to proceed?"  2. Give patient instructions for WebEx download to smartphone as below if video visit  3. Advise patient to be prepared with any vital sign or heart rhythm information, their current medicines, and a piece of paper and pen handy for any instructions they may receive the day of their visit  4. Inform patient they will receive a phone call 15 minutes prior to their appointment time (may be from unknown caller ID) so they should be prepared to answer  5. Confirm that appointment type is correct in Epic appointment notes (video vs telephone)    TELEPHONE CALL NOTE  Crystal Roberson has been deemed a candidate for a follow-up tele-health visit to limit community exposure during the Covid-19 pandemic. I spoke with the patient via phone to ensure availability of phone/video source, confirm preferred email & phone number, and discuss  instructions and expectations.  I reminded Crystal Roberson to be prepared with any vital sign and/or heart rhythm information that could potentially be obtained via home monitoring, at the time of her visit. I reminded Crystal Roberson to expect a phone call at the time of her visit if her visit.  Did the patient verbally acknowledge consent to treatment? Yes  Norman Herrlich 12/27/2018 3:45 PM   DOWNLOADING THE WEBEX SOFTWARE TO SMARTPHONE  - If Apple, go to [employer] and type in WebEx in the search bar. Download [employer], the blue/green circle. The app is free but as with any other app downloads, their phone may require them to verify saved payment information or Apple password. The patient does NOT have to create an account.  - If Android, ask patient to go to [employer] and type in WebEx in the search bar. Download [employer], the blue/green circle. The app is free but as with any other app downloads, their phone may require them to verify saved payment information or Android password. The patient does NOT have to create an account.   CONSENT FOR TELE-HEALTH VISIT - PLEASE REVIEW  I hereby voluntarily request, consent and authorize [facility] and its employed or contracted physicians, physician assistants, nurse practitioners or other licensed health care professionals (the Practitioner), to provide me with telemedicine health care services (the Services") as deemed necessary by the treating Practitioner. I acknowledge  and consent to receive the Services by the Practitioner via telemedicine. I understand that the telemedicine visit will involve communicating with the Practitioner through live audiovisual communication technology and the disclosure of certain medical information by electronic transmission. I acknowledge that I have been given the opportunity to request an in-person assessment or other available alternative prior to the telemedicine visit and am  voluntarily participating in the telemedicine visit.  I understand that I have the right to withhold or withdraw my consent to the use of telemedicine in the course of my care at any time, without affecting my right to future care or treatment, and that the Practitioner or I may terminate the telemedicine visit at any time. I understand that I have the right to inspect all information obtained and/or recorded in the course of the telemedicine visit and may receive copies of available information for a reasonable fee.  I understand that some of the potential risks of receiving the Services via telemedicine include:   Delay or interruption in medical evaluation due to technological equipment failure or disruption;  Information transmitted may not be sufficient (e.g. poor resolution of images) to allow for appropriate medical decision making by the Practitioner; and/or   In rare instances, security protocols could fail, causing a breach of personal health information.  Furthermore, I acknowledge that it is my responsibility to provide information about my medical history, conditions and care that is complete and accurate to the best of my ability. I acknowledge that Practitioner's advice, recommendations, and/or decision may be based on factors not within their control, such as incomplete or inaccurate data provided by me or distortions of diagnostic images or specimens that may result from electronic transmissions. I understand that the practice of medicine is not an exact science and that Practitioner makes no warranties or guarantees regarding treatment outcomes. I acknowledge that I will receive a copy of this consent concurrently upon execution via email to the email address I last provided but may also request a printed copy by calling the office of [place].    I understand that my insurance will be billed for this visit.   I have read or had this consent read to me.  I understand the  contents of this consent, which adequately explains the benefits and risks of the Services being provided via telemedicine.   I have been provided ample opportunity to ask questions regarding this consent and the Services and have had my questions answered to my satisfaction.  I give my informed consent for the services to be provided through the use of telemedicine in my medical care  By participating in this telemedicine visit I agree to the above.

## 2019-01-09 NOTE — Progress Notes (Deleted)
{Choose 1 Note Type (Telehealth Visit or Telephone Visit):[phone]}   Evaluation Performed:  Follow-up visit  Date:  01/09/2019   ID:  Ladell Piermy D Spiller, DOB 1965-11-13, MRN [id number]  Patient Location: Home Provider Location: Office  PCP:  Glori LuisSonnenberg, Eric G, MD  Cardiologist:  Yvonne Kendallhristopher Reeve Mallo, MD  Electrophysiologist:  None   Chief Complaint:  ***  History of Present Illness:    Crystal Roberson is a 53 y.o. female with history of coronary artery disease status post anterior STEMI in 08/2016, chronic systolic heart failure due to ischemic cardiomyopathystatus post ICD(05/2016), hypertensionwith suspicion for hyperaldosteronism, migraine headaches, degenerative disc disease with chronic back pain, and depression.  We are speaking today for follow-up of her coronary artery disease and cardiomyopathy.  I last saw her in January, at which time she reported feeling about the same, though lightheadedness and hypotensive episodes had been less frequent.  Due to elevated blood pressure, we agreed to increase lisinopril to 5 mg twice daily.  The patient {does/does not:200015} have symptoms concerning for COVID-19 infection (fever, chills, cough, or new shortness of breath).    Past Medical History:  Diagnosis Date  . Allergy   . CAD (coronary artery disease)    a. 08/2016 Ant STEMI/PCI: LAD 7455m (2.25x20 Promus Premier DES); b. 04/2017 Cath: LM nl, LAD 20ost, 70ost->vasospasm, Patent mid stent, 50d, D1/2/3 small, LCX 20ost, 239m, OM1/2/3 small, RCA 8885m, 20d, EF 30-35%, mild to mod MR.  . Chicken pox   . Chronic back pain   . Chronic fatigue   . DDD (degenerative disc disease), lumbar   . Depression   . HFrEF (heart failure with reduced ejection fraction) (HCC)    a. 01/2017 Echo: Ef 35-40%, septal apical, distal inf, anterior HK. Mod dil LV. Mild to mod MR. mildly dil LA; b. 04/2017 LV Gram: EF 30-35%.  . Hyperlipidemia LDL goal <70   . Hypertension   . Ischemic cardiomyopathy    a. 01/2017 Echo:  EF 35-40%; b. 04/2017 LV Gram: EF 30-35%; c. 05/2017 s/p SJM [id number] Ellipse VR RV only AICD.  . Migraines   . Syncope 09/12/2016   Past Surgical History:  Procedure Laterality Date  . CARDIAC CATHETERIZATION N/A 09/23/2016   Procedure: Left Heart Cath and Coronary Angiography;  Surgeon: Yvonne Kendallhristopher Jerremy Maione, MD;  [facility];  Service: Cardiovascular;  Laterality: N/A;  . CARDIAC CATHETERIZATION N/A 09/23/2016   Procedure: Coronary Stent Intervention;  Surgeon: Yvonne Kendallhristopher Shyia Fillingim, MD;  Location: [facility] IN[facility]AS[facility] CV LAB;  Service: Cardiovascular;  Laterality: N/A;  . CERVICAL DISC SURGERY    . ICD IMPLANT N/A 05/31/2017   Procedure: ICD Implant;  Surgeon: Regan Lemmingamnitz, Will Martin, MD;  [facility];  Service: Cardiovascular;  Laterality: N/A;  . RIGHT/LEFT HEART CATH AND CORONARY ANGIOGRAPHY N/A 04/26/2017   Procedure: Right/Left Heart Cath and Coronary Angiography;  Surgeon: Yvonne KendallEnd, Annelie Boak, MD;  Location: [facility] INVASIVE CV LAB;  Service: Cardiovascular;  Laterality: N/A;  . TUBAL LIGATION    . ULTRASOUND GUIDANCE FOR VASCULAR ACCESS  04/26/2017   Procedure: Ultrasound Guidance For Vascular Access;  Surgeon: Yvonne KendallEnd, Joleena Weisenburger, MD;  Location: [facility] INVASIVE CV LAB;  Service: Cardiovascular;;     No outpatient medications have been marked as taking for the 01/10/19 encounter (Appointment) with Aury Scollard, Cristal Deerhristopher, MD.     Allergies:   Pregabalin and Codeine   Social History   Tobacco Use  . Smoking status: Former Smoker    Packs/day: 0.75    Years: 30.00  Pack years: 22.50    Types: Cigarettes    Last attempt to quit: 09/23/2016    Years since quitting: 2.2  . Smokeless tobacco: Never Used  Substance Use Topics  . Alcohol use: No    Alcohol/week: 0.0 standard drinks  . Drug use: No     Family Hx: The patient's family history includes Arthritis in her mother; Heart disease in her maternal grandmother; Hyperlipidemia in her mother; Hypertension in her maternal grandmother and  mother; Mental illness in her mother.  ROS:   Please see the history of present illness.    *** All other systems reviewed and are negative.   Prior CV studies:   The following studies were reviewed today:  Cath/PCI:  LHC/RHC (04/26/17): LMCA normal. LAD with ostial vasospasm improved to 20% with intracoronary nitroglycerin. Patent stent in mid LAD. Distal LAD shows 50% stenosis, unchanged. There are 20% ostial and 30% mid LCx lesions. Sequential 40% and 20% mid and distal RCA stenoses present. LVEDP 15-18 mmHg. LVEF 30-35% with anterior and apical akinesis. RA 6, RV 32/5, PA 32/12 (19), PCWP 17. AO sat 93%. PA sat 64%. Fick CO/CI 4.8/2.8.  LHC/PCI (09/23/16): Anterior STEMI with 99% mid LAD stenosis with TIMI 2 flow. Mild to moderate, nonobstructive CAD involving the ostial and distal LAD, ostial and mid LCx, and mid/distal RCA. Severely reduced LV contraction (EF 25-35%) with anterior, apical, and apical inferior akinesis. Moderately elevated left ventricular filling pressure (LVEDP 27 mmHg).  EP Procedures and Devices:  ICD placement (05/31/17,Dr. Camnitz): St. Jude single-chamber deveice  Event monitor (01/2017): Final report pending preliminary review today demonstrates normal sinus rhythm with no arrhythmias during triggered events.  Non-Invasive Evaluation(s):  Limited TTE (01/23/17): Dilated left ventricle with normal wall thickness. LVEF 35-40% with apical anterior, apical septal, and apical inferior hypokinesis. Mild to moderate MR. Mild left atrial enlargement. Normal RV size and function.  Transthoracic echocardiogram (11/06/16): LVEF 30-35% with severe hypokinesis of mid/apical septal, anterior, and lateral walls, as well as the apex. Moderate to severe MR. Mild left atrial enlargment. Moderate pulmonary hypertension.  Transthoracic echocardiogram (09/13/16): Normal LV size and contraction (EF 60-65%). Normal diastolic function. Trivial mitral and tricuspid regurgitation. Normal  RV size and function.  Labs/Other Tests and Data Reviewed:    EKG:  {BJY:[id number]}  Recent Labs: 01/21/2018: Hemoglobin 14.9; Platelets 183 10/09/2018: ALT 14; BUN 12; Creatinine, Ser 0.93; Potassium 4.4; Sodium 142   Recent Lipid Panel Lab Results  Component Value Date/Time   CHOL 152 10/09/2018 09:35 AM   TRIG 95 10/09/2018 09:35 AM   HDL 61 10/09/2018 09:35 AM   CHOLHDL 2.5 10/09/2018 09:35 AM   CHOLHDL 2 03/30/2017 03:21 PM   LDLCALC 72 10/09/2018 09:35 AM    Wt Readings from Last 3 Encounters:  10/09/18 165 lb (74.8 kg)  07/04/18 166 lb 8 oz (75.5 kg)  03/12/18 158 lb 8 oz (71.9 kg)     Objective:    Vital Signs:  There were no vitals taken for this visit.   Well nourished, well developed female in no*** acute distress. ***  ASSESSMENT & PLAN:    1. ***  COVID-19 Education: The signs and symptoms of COVID-19 were discussed with the patient and how to seek care for testing (follow up with PCP or arrange E-visit).  ***The importance of social distancing was discussed today.  Time:   Today, I have spent *** minutes with the patient with telehealth technology discussing the above problems.     Medication Adjustments/Labs  and Tests Ordered: Current medicines are reviewed at length with the patient today.  Concerns regarding medicines are outlined above.   Tests Ordered: No orders of the defined types were placed in this encounter.   Medication Changes: No orders of the defined types were placed in this encounter.   Disposition:  Follow up {follow up:15908}  Signed, Yvonne Kendall, MD  01/09/2019 12:54 PM    [facility]

## 2019-01-10 ENCOUNTER — Telehealth: Payer: Self-pay | Admitting: Internal Medicine

## 2019-01-10 ENCOUNTER — Other Ambulatory Visit: Payer: Self-pay

## 2019-01-10 DIAGNOSIS — I5022 Chronic systolic (congestive) heart failure: Secondary | ICD-10-CM

## 2019-01-10 DIAGNOSIS — Z79899 Other long term (current) drug therapy: Secondary | ICD-10-CM

## 2019-01-10 NOTE — Telephone Encounter (Signed)
Do you mind checking with her on how she is taking this medication? At her office visit with Dr. Okey Dupre on 10/09/2018- her chart stated spironolactone 25 mg once daily.  It is unusual to see this used PRN.   Thanks!

## 2019-01-10 NOTE — Telephone Encounter (Signed)
Dr. Okey Dupre- can you review refill request  Refill is for spironolactone 25 mg - take 1/2 tablet BID PRN.  I've not seen this used PRN.  Her last office visit with you states she was taking spironolactone 25 mg once daily.   Jearld Adjutant has confirmed with the patient she is taking spironolactone 25 mg- 1/2 tablet BID PRN.

## 2019-01-10 NOTE — Telephone Encounter (Signed)
I do not prescribe prn spironolactone.  We will address this during virtual visit on Monday.  Yvonne Kendall, MD [facility] Pager: [phone]

## 2019-01-10 NOTE — Telephone Encounter (Signed)
Pt read instructions off her medication bottle.

## 2019-01-10 NOTE — Telephone Encounter (Signed)
Pt is taking as listed below Spironolactone 25 mg tablet 1/2 tablet bid prn.

## 2019-01-10 NOTE — Telephone Encounter (Signed)
Please advise if ok to refill Spironolactone 25 mg 1/2 tablet bid. Pt last rx filled by pcp.

## 2019-01-11 NOTE — Progress Notes (Signed)
Virtual Visit via Telephone Note   This visit type was conducted due to national recommendations for restrictions regarding the COVID-19 Pandemic (e.g. social distancing) in an effort to limit this patient's exposure and mitigate transmission in our community.  Due to her co-morbid illnesses, this patient is at least at moderate risk for complications without adequate follow up.  This format is felt to be most appropriate for this patient at this time.  The patient did not have access to video technology/had technical difficulties with video requiring transitioning to audio format only (telephone).  All issues noted in this document were discussed and addressed.  No physical exam could be performed with this format.  Please refer to the patient's chart for her  consent to telehealth for [facility].   Evaluation Performed:  Follow-up visit  Date:  01/13/2019   ID:  Crystal Roberson, DOB 02/25/1966, MRN [id number]  Patient Location: Home Provider Location: Office  PCP:  Glori Luis, MD  Cardiologist:  Yvonne Kendall, MD  Electrophysiologist:  None   Chief Complaint:  Shortness of breath  History of Present Illness:    Crystal Roberson is a 53 y.o. female with history of coronary artery disease status post anterior STEMI in 08/2016, chronic systolic heart failure due to ischemic cardiomyopathystatus post ICD(05/2016), hypertensionwith suspicion for hyperaldosteronism, migraine headaches, degenerative disc disease with chronic back pain, and depression.  We are speaking today for follow-up of her coronary artery disease and cardiomyopathy.  I last saw her in January, at which time she reported feeling about the same, though lightheadedness and hypotensive episodes had been less frequent.  Due to elevated blood pressure, we agreed to increase lisinopril to 5 mg twice daily.  About 2 to 3 weeks after our visit in January, [name] began noticing increasing fatigue, shortness of breath, and  vague chest tightness.  There were no clear precipitants.  She had occasional leg swelling, for which she would use furosemide 20 mg daily on an as-needed basis.  Though her leg swelling would improve, she did not notice much difference in her dyspnea or other symptoms.  She has 2 pillow orthopnea.  Over the last few weeks, the symptoms have gradually improved.  She is now able to walk several blocks, though she still gets tired easily.  Chest pain has subsided.  Her weight has been stable around 167 pounds.  [name] notes continued fluctuations in her blood pressure.  Frequently, when she feels particularly unwell, she will noticed that her blood pressure is elevated around systolic.  She would then recheck her blood pressure a few minutes later and find it to be quite low (she reports a lowest pressure of 60/30).  She has not experienced any palpitations or lightheadedness.  She denies syncope.  She reports being compliant with her medications.  The patient does not have symptoms concerning for COVID-19 infection (fever, chills, cough, or new shortness of breath).    Past Medical History:  Diagnosis Date  . Allergy   . CAD (coronary artery disease)    a. 08/2016 Ant STEMI/PCI: LAD 88m (2.25x20 Promus Premier DES); b. 04/2017 Cath: LM nl, LAD 20ost, 70ost->vasospasm, Patent mid stent, 50d, D1/2/3 small, LCX 20ost, 15m, OM1/2/3 small, RCA 20m, 20d, EF 30-35%, mild to mod MR.  . Chicken pox   . Chronic back pain   . Chronic fatigue   . DDD (degenerative disc disease), lumbar   . Depression   . HFrEF (heart failure with reduced ejection  fraction) (HCC)    a. 01/2017 Echo: Ef 35-40%, septal apical, distal inf, anterior HK. Mod dil LV. Mild to mod MR. mildly dil LA; b. 04/2017 LV Gram: EF 30-35%.  . Hyperlipidemia LDL goal <70   . Hypertension   . Ischemic cardiomyopathy    a. 01/2017 Echo: EF 35-40%; b. 04/2017 LV Gram: EF 30-35%; c. 05/2017 s/p SJM [id number] Ellipse VR RV only AICD.  . Migraines    . Syncope 09/12/2016   Past Surgical History:  Procedure Laterality Date  . CARDIAC CATHETERIZATION N/A 09/23/2016   Procedure: Left Heart Cath and Coronary Angiography;  Surgeon: Yvonne Kendallhristopher Apolinar Bero, MD;  [facility];  Service: Cardiovascular;  Laterality: N/A;  . CARDIAC CATHETERIZATION N/A 09/23/2016   Procedure: Coronary Stent Intervention;  Surgeon: Yvonne Kendallhristopher Jacaden Forbush, MD;  Location: [facility] INVASIVE CV LAB;  Service: Cardiovascular;  Laterality: N/A;  . CERVICAL DISC SURGERY    . ICD IMPLANT N/A 05/31/2017   Procedure: ICD Implant;  Surgeon: Regan Lemmingamnitz, Will Martin, MD;  [facility];  Service: Cardiovascular;  Laterality: N/A;  . RIGHT/LEFT HEART CATH AND CORONARY ANGIOGRAPHY N/A 04/26/2017   Procedure: Right/Left Heart Cath and Coronary Angiography;  Surgeon: Yvonne KendallEnd, Lavina Resor, MD;  Location: [facility] INVASIVE CV LAB;  Service: Cardiovascular;  Laterality: N/A;  . TUBAL LIGATION    . ULTRASOUND GUIDANCE FOR VASCULAR ACCESS  04/26/2017   Procedure: Ultrasound Guidance For Vascular Access;  Surgeon: Yvonne KendallEnd, Camaria Gerald, MD;  Location: [facility] INVASIVE CV LAB;  Service: Cardiovascular;;     Current Meds  Medication Sig  . aspirin 81 MG chewable tablet Chew 1 tablet (81 mg total) by mouth daily.  Marland Kitchen. atorvastatin (LIPITOR) 40 MG tablet TAKE 1 TABLET BY MOUTH ONCE DAILY  . buPROPion (WELLBUTRIN XL) 150 MG 24 hr tablet TAKE 1 TABLET BY MOUTH ONCE A DAY  . carvedilol (COREG) 6.25 MG tablet Take 1 tablet (6.25 mg total) by mouth 2 (two) times daily.  . clopidogrel (PLAVIX) 75 MG tablet TAKE 1 TABLET BY MOUTH ONCE A DAY  . furosemide (LASIX) 20 MG tablet Take 1 tablet (20 mg) by mouth every day for 1 week, then take 1 tablet (20 mg) by mouth every other day. (Patient taking differently: Take 20 mg by mouth daily as needed. Take 1 tablet (20 mg) by mouth every day for 1 week, then take 1 tablet (20 mg) by mouth every other day.)  . lidocaine (LIDODERM) 5 % Place 2-3 patches onto the skin every 12  (twelve) hours as needed (pain). Remove & Discard patch within 12 hours or as directed by MD   . lisinopril (PRINIVIL,ZESTRIL) 5 MG tablet Take 1 tablet (5 mg total) by mouth 2 (two) times daily.  Marland Kitchen. morphine (MS CONTIN) 15 MG 12 hr tablet Take 15 mg by mouth every 8 (eight) hours.   . nitroGLYCERIN (NITROSTAT) 0.4 MG SL tablet Place 1 tablet (0.4 mg total) under the tongue every 5 (five) minutes as needed for chest pain (CP or SOB).  . potassium chloride SA (K-DUR,KLOR-CON) 20 MEQ tablet TAKE 1 TABLET BY MOUTH ONCE DAILY *IF YOU TAKE LASIX TAKE AN EXTRA DOSE AS DIRECTED*  . spironolactone (ALDACTONE) 25 MG tablet Take 12.5 mg by mouth 2 (two) times a day.   Marland Kitchen. tiZANidine (ZANAFLEX) 4 MG tablet Take 4-8 mg by mouth daily as needed for muscle spasms.  Marland Kitchen. zolpidem (AMBIEN) 10 MG tablet Take 1 tablet (10 mg total) by mouth at bedtime as needed. for sleep  . [  DISCONTINUED] nitroGLYCERIN (NITROSTAT) 0.4 MG SL tablet Place 1 tablet (0.4 mg total) under the tongue every 5 (five) minutes as needed for chest pain (CP or SOB).     Allergies:   Pregabalin and Codeine   Social History   Tobacco Use  . Smoking status: Former Smoker    Packs/day: 0.75    Years: 30.00    Pack years: 22.50    Types: Cigarettes    Last attempt to quit: 09/23/2016    Years since quitting: 2.3  . Smokeless tobacco: Never Used  Substance Use Topics  . Alcohol use: No    Alcohol/week: 0.0 standard drinks  . Drug use: No     Family Hx: The patient's family history includes Arthritis in her mother; Heart disease in her maternal grandmother; Hyperlipidemia in her mother; Hypertension in her maternal grandmother and mother; Mental illness in her mother.  ROS:   Ms. Ohalloran has chronic back pain, which has flared over the last few days in particular.  Otherwise, a 12-system review of systems was performed and was negative except as noted in the HPI.   Prior CV studies:   The following studies were reviewed today:   Cath/PCI:  LHC/RHC (04/26/17): LMCA normal. LAD with ostial vasospasm improved to 20% with intracoronary nitroglycerin. Patent stent in mid LAD. Distal LAD shows 50% stenosis, unchanged. There are 20% ostial and 30% mid LCx lesions. Sequential 40% and 20% mid and distal RCA stenoses present. LVEDP 15-18 mmHg. LVEF 30-35% with anterior and apical akinesis. RA 6, RV 32/5, PA 32/12 (19), PCWP 17. AO sat 93%. PA sat 64%. Fick CO/CI 4.8/2.8.  LHC/PCI (09/23/16): Anterior STEMI with 99% mid LAD stenosis with TIMI 2 flow. Mild to moderate, nonobstructive CAD involving the ostial and distal LAD, ostial and mid LCx, and mid/distal RCA. Severely reduced LV contraction (EF 25-35%) with anterior, apical, and apical inferior akinesis. Moderately elevated left ventricular filling pressure (LVEDP 27 mmHg).  EP Procedures and Devices:  ICD placement (05/31/17,Dr. Camnitz): St. Jude single-chamber deveice  Event monitor (01/2017): Final report pending preliminary review today demonstrates normal sinus rhythm with no arrhythmias during triggered events.  Non-Invasive Evaluation(s):  Limited TTE (01/23/17): Dilated left ventricle with normal wall thickness. LVEF 35-40% with apical anterior, apical septal, and apical inferior hypokinesis. Mild to moderate MR. Mild left atrial enlargement. Normal RV size and function.  Transthoracic echocardiogram (11/06/16): LVEF 30-35% with severe hypokinesis of mid/apical septal, anterior, and lateral walls, as well as the apex. Moderate to severe MR. Mild left atrial enlargment. Moderate pulmonary hypertension.  Transthoracic echocardiogram (09/13/16): Normal LV size and contraction (EF 60-65%). Normal diastolic function. Trivial mitral and tricuspid regurgitation. Normal RV size and function.  Labs/Other Tests and Data Reviewed:    EKG:  No ECG reviewed.  Recent Labs: 01/21/2018: Hemoglobin 14.9; Platelets 183 10/09/2018: ALT 14; BUN 12; Creatinine, Ser 0.93; Potassium 4.4;  Sodium 142   Recent Lipid Panel Lab Results  Component Value Date/Time   CHOL 152 10/09/2018 09:35 AM   TRIG 95 10/09/2018 09:35 AM   HDL 61 10/09/2018 09:35 AM   CHOLHDL 2.5 10/09/2018 09:35 AM   CHOLHDL 2 03/30/2017 03:21 PM   LDLCALC 72 10/09/2018 09:35 AM    Wt Readings from Last 3 Encounters:  01/13/19 167 lb (75.8 kg)  10/09/18 165 lb (74.8 kg)  07/04/18 166 lb 8 oz (75.5 kg)     Objective:    Vital Signs:  BP (!) 155/95 (BP Location: Left Arm, Patient Position: Sitting, Cuff  Size: Normal)   Pulse 74   Ht  (1.651 m)   Wt 167 lb (75.8 kg)   BMI 27.79 kg/m    ASSESSMENT & PLAN:    Coronary artery disease: Ms. Diles experienced about a month of increasing dyspnea and vague chest tightness around late January and into February.  She has had similar episodes in the past, which led to cardiac catheterization in 04/2017.  LAD stent was noted to be patent without other significant CAD.  As her symptoms have started to improve spontaneously, we have agreed to defer additional testing at this time.  We will continue indefinite dual antiplatelet therapy with aspirin and clopidogrel.  Chronic systolic heart failure secondary to ischemic cardiomyopathy: Weight is stable.  Ms. Cabral continues to complain of NYHA class III heart failure symptoms.  Uncertain as to why she had symptoms shortly after our last visit today is still on the high side, though she notes wide variations in her home readings.  Hyperlipidemia: Most recent LDL in January was just above goal, though the patient had had a statin holiday around that time.  We will continue atorvastatin 40 mg daily.  Hypertension: Blood pressure suboptimally controlled today, though the patient continues to report wide fluctuations with some readings as low as 60/30.  Most office readings have been high.  We discussed the utility of a 4-hour ambulatory blood pressure monitoring but have agreed to defer this.  COVID-19  Education: The signs and symptoms of COVID-19 were discussed with the patient and how to seek care for testing (follow up with PCP or arrange E-visit).  The importance of social distancing was discussed today.  Time:   Today, I have spent 22 minutes with the patient with telehealth technology discussing the above problems.     Medication Adjustments/Labs and Tests Ordered: Current medicines are reviewed at length with the patient today.  Concerns regarding medicines are outlined above.   Tests Ordered: No orders of the defined types were placed in this encounter.   Medication Changes: Meds ordered this encounter  Medications  . nitroGLYCERIN (NITROSTAT) 0.4 MG SL tablet    Sig: Place 1 tablet (0.4 mg total) under the tongue every 5 (five) minutes as needed for chest pain (CP or SOB).    Dispense:  25 tablet    Refill:  1    Disposition:  Follow up in 2 month(s)  Signed, Yvonne Kendall, MD  01/13/2019 9:23 AM    [facility]

## 2019-01-13 ENCOUNTER — Encounter: Payer: Self-pay | Admitting: Internal Medicine

## 2019-01-13 ENCOUNTER — Telehealth (INDEPENDENT_AMBULATORY_CARE_PROVIDER_SITE_OTHER): Payer: Self-pay | Admitting: Internal Medicine

## 2019-01-13 ENCOUNTER — Other Ambulatory Visit: Payer: Self-pay

## 2019-01-13 VITALS — BP 155/95 | HR 74 | Ht 65.0 in | Wt 167.0 lb

## 2019-01-13 DIAGNOSIS — I255 Ischemic cardiomyopathy: Secondary | ICD-10-CM

## 2019-01-13 DIAGNOSIS — Z7189 Other specified counseling: Secondary | ICD-10-CM

## 2019-01-13 DIAGNOSIS — I110 Hypertensive heart disease with heart failure: Secondary | ICD-10-CM

## 2019-01-13 DIAGNOSIS — I2510 Atherosclerotic heart disease of native coronary artery without angina pectoris: Secondary | ICD-10-CM

## 2019-01-13 DIAGNOSIS — I5022 Chronic systolic (congestive) heart failure: Secondary | ICD-10-CM

## 2019-01-13 DIAGNOSIS — I25119 Atherosclerotic heart disease of native coronary artery with unspecified angina pectoris: Secondary | ICD-10-CM

## 2019-01-13 DIAGNOSIS — E785 Hyperlipidemia, unspecified: Secondary | ICD-10-CM

## 2019-01-13 DIAGNOSIS — I10 Essential (primary) hypertension: Secondary | ICD-10-CM

## 2019-01-13 MED ORDER — NITROGLYCERIN 0.4 MG SL SUBL: 0.4000 mg | SUBLINGUAL_TABLET | SUBLINGUAL | 1 refills | Status: DC | PRN

## 2019-01-13 NOTE — Patient Instructions (Signed)
Medication Instructions:  Your physician recommends that you continue on your current medications as directed. Please refer to the Current Medication list given to you today.  If you need a refill on your cardiac medications before your next appointment, please call your pharmacy.   Lab work: none If you have labs (blood work) drawn today and your tests are completely normal, you will receive your results only by: Marland Kitchen MyChart Message (if you have MyChart) OR . A paper copy in the mail If you have any lab test that is abnormal or we need to change your treatment, we will call you to review the results.  Testing/Procedures: none  Follow-Up: At [facility], you and your health needs are our priority.  As part of our continuing mission to provide you with exceptional heart care, we have created designated Provider Care Teams.  These Care Teams include your primary Cardiologist (physician) and Advanced Practice Providers (APPs -  Physician Assistants and Nurse Practitioners) who all work together to provide you with the care you need, when you need it. You will need a follow up appointment in 2 months (hopefully in person, but otherwise virtual).  Please call our office 2 months in advance to schedule this appointment.  You may see Yvonne Kendall, MD or one of the following Advanced Practice Providers on your designated Care Team:   Nicolasa Ducking, NP Eula Listen, PA-C . Marisue Ivan, PA-C

## 2019-01-13 NOTE — Telephone Encounter (Signed)
Pt seen today virtual visit no changes made to her Spironolactone. Please advise if ok to refill.

## 2019-01-13 NOTE — Telephone Encounter (Signed)
Dr. Okey Dupre- she had a visit with you today and confirms she is taking spironolactone 25 mg- 1/2 tablet BID.  Ok to refill like this (still seems a bit odd to take it this way)?

## 2019-01-13 NOTE — Telephone Encounter (Signed)
I spoke with pt this am for her virtual appointment. She confirmed that she is taking her spironolactone 25 mg tablet 1/2 tablet BID. She mentioned that she may have got her medications mixed up and confirmed she is not taking spironolactone prn but everyday bid.

## 2019-01-14 NOTE — Telephone Encounter (Signed)
Crystal Roberson, I reordered her spironolactone. Dr. Okey Dupre wanted her to have a repeat BMP post COVID- didn't know if you had a way of keeping track of which patient's of his will need labs once the pandemic has subsided.  Thanks!

## 2019-01-14 NOTE — Telephone Encounter (Signed)
It is okay to refill spironolactone 12.5 mg BID (it should not be prn).  She will need a BMP as soon as COVID-19 precautions have eased.  Thanks.  Yvonne Kendall, MD [facility] Pager: [phone]

## 2019-01-15 NOTE — Addendum Note (Signed)
Addended by: Stann Mainland on: 01/15/2019 10:12 AM   Modules accepted: Orders

## 2019-01-21 ENCOUNTER — Telehealth: Payer: Self-pay | Admitting: Internal Medicine

## 2019-01-21 NOTE — Telephone Encounter (Signed)
LMOV to schedule fu   °

## 2019-01-21 NOTE — Telephone Encounter (Signed)
-----   Message from Coralee Rud sent at 01/15/2019 12:57 PM EDT ----- Regarding: RE: 2 month f/u L mom to call and schedule  ----- Message ----- From: Stann Mainland, RN Sent: 01/15/2019  12:42 PM EDT To: Mickie Bail Burl Scheduling Subject: 2 month f/u                                    Patient was seen by Dr Okey Dupre 4/20. Will need 2 month f/u.  THanks, [employer]

## 2019-02-10 ENCOUNTER — Telehealth: Payer: Self-pay | Admitting: Internal Medicine

## 2019-02-10 ENCOUNTER — Observation Stay: Payer: Self-pay

## 2019-02-10 ENCOUNTER — Observation Stay
Admission: EM | Admit: 2019-02-10 | Discharge: 2019-02-11 | Disposition: A | Discharge status: Home / Self Care | Payer: Self-pay | Attending: Specialist | Admitting: Specialist

## 2019-02-10 ENCOUNTER — Emergency Department: Payer: Self-pay

## 2019-02-10 ENCOUNTER — Other Ambulatory Visit: Payer: Self-pay

## 2019-02-10 DIAGNOSIS — Z7902 Long term (current) use of antithrombotics/antiplatelets: Secondary | ICD-10-CM | POA: Insufficient documentation

## 2019-02-10 DIAGNOSIS — I252 Old myocardial infarction: Secondary | ICD-10-CM | POA: Insufficient documentation

## 2019-02-10 DIAGNOSIS — Z7982 Long term (current) use of aspirin: Secondary | ICD-10-CM | POA: Insufficient documentation

## 2019-02-10 DIAGNOSIS — Z79899 Other long term (current) drug therapy: Secondary | ICD-10-CM | POA: Insufficient documentation

## 2019-02-10 DIAGNOSIS — Z885 Allergy status to narcotic agent status: Secondary | ICD-10-CM | POA: Insufficient documentation

## 2019-02-10 DIAGNOSIS — G8929 Other chronic pain: Secondary | ICD-10-CM | POA: Insufficient documentation

## 2019-02-10 DIAGNOSIS — Z95810 Presence of automatic (implantable) cardiac defibrillator: Secondary | ICD-10-CM | POA: Insufficient documentation

## 2019-02-10 DIAGNOSIS — E785 Hyperlipidemia, unspecified: Secondary | ICD-10-CM | POA: Insufficient documentation

## 2019-02-10 DIAGNOSIS — I255 Ischemic cardiomyopathy: Secondary | ICD-10-CM | POA: Insufficient documentation

## 2019-02-10 DIAGNOSIS — I2510 Atherosclerotic heart disease of native coronary artery without angina pectoris: Secondary | ICD-10-CM | POA: Insufficient documentation

## 2019-02-10 DIAGNOSIS — I5022 Chronic systolic (congestive) heart failure: Secondary | ICD-10-CM | POA: Insufficient documentation

## 2019-02-10 DIAGNOSIS — R1011 Right upper quadrant pain: Secondary | ICD-10-CM

## 2019-02-10 DIAGNOSIS — F329 Major depressive disorder, single episode, unspecified: Secondary | ICD-10-CM | POA: Insufficient documentation

## 2019-02-10 DIAGNOSIS — N179 Acute kidney failure, unspecified: Secondary | ICD-10-CM | POA: Insufficient documentation

## 2019-02-10 DIAGNOSIS — R079 Chest pain, unspecified: Principal | ICD-10-CM | POA: Insufficient documentation

## 2019-02-10 DIAGNOSIS — I110 Hypertensive heart disease with heart failure: Secondary | ICD-10-CM | POA: Insufficient documentation

## 2019-02-10 DIAGNOSIS — Z1159 Encounter for screening for other viral diseases: Secondary | ICD-10-CM | POA: Insufficient documentation

## 2019-02-10 DIAGNOSIS — Z955 Presence of coronary angioplasty implant and graft: Secondary | ICD-10-CM | POA: Insufficient documentation

## 2019-02-10 LAB — HEPATIC FUNCTION PANEL
ALT: 21 U/L (ref 0–44)
AST: 22 U/L (ref 15–41)
Albumin: 3.9 g/dL (ref 3.5–5.0)
Alkaline Phosphatase: 151 U/L — ABNORMAL HIGH (ref 38–126)
Bilirubin, Direct: <0.1 mg/dL (ref 0.0–0.2)
Total Bilirubin: 0.7 mg/dL (ref 0.3–1.2)
Total Protein: 7.1 g/dL (ref 6.5–8.1)

## 2019-02-10 LAB — BASIC METABOLIC PANEL
Anion gap: 8 (ref 5–15)
BUN: 14 mg/dL (ref 6–20)
CO2: 25 mmol/L (ref 22–32)
Calcium: 8.9 mg/dL (ref 8.9–10.3)
Chloride: 107 mmol/L (ref 98–111)
Creatinine, Ser: 0.82 mg/dL (ref 0.44–1.00)
GFR calc Af Amer: >60 mL/min (ref >60)
GFR calc non Af Amer: >60 mL/min (ref >60)
Glucose, Bld: 143 mg/dL — ABNORMAL HIGH (ref 70–99)
Potassium: 3.6 mmol/L (ref 3.5–5.1)
Sodium: 140 mmol/L (ref 135–145)

## 2019-02-10 LAB — TROPONIN I
Troponin I: <0.03 ng/mL (ref <0.03)
Troponin I: <0.03 ng/mL (ref <0.03)
Troponin I: <0.03 ng/mL (ref <0.03)

## 2019-02-10 LAB — CBC
HCT: 41 % (ref 36.0–46.0)
Hemoglobin: 14 g/dL (ref 12.0–15.0)
MCH: 29.5 pg (ref 26.0–34.0)
MCHC: 34.1 g/dL (ref 30.0–36.0)
MCV: 86.5 fL (ref 80.0–100.0)
Platelets: 173 10*3/uL (ref 150–400)
RBC: 4.74 MIL/uL (ref 3.87–5.11)
RDW: 11.5 % (ref 11.5–15.5)
WBC: 9.2 10*3/uL (ref 4.0–10.5)
nRBC: 0 % (ref 0.0–0.2)

## 2019-02-10 LAB — HEMOGLOBIN A1C
Hgb A1c MFr Bld: 5.4 % (ref 4.8–5.6)
Mean Plasma Glucose: 108.28 mg/dL

## 2019-02-10 LAB — MAGNESIUM: Magnesium: 2 mg/dL (ref 1.7–2.4)

## 2019-02-10 LAB — SARS CORONAVIRUS 2 BY RT PCR (HOSPITAL ORDER, PERFORMED IN ~~LOC~~ HOSPITAL LAB): SARS Coronavirus 2: NEGATIVE

## 2019-02-10 LAB — LIPASE, BLOOD: Lipase: 23 U/L (ref 11–51)

## 2019-02-10 MED ORDER — HYDROMORPHONE HCL 1 MG/ML IJ SOLN
0.5000 mg | INTRAMUSCULAR | Status: DC | PRN
  Administered 2019-02-10: 0.5 mg via INTRAVENOUS
  Filled 2019-02-10: qty 1

## 2019-02-10 MED ORDER — LISINOPRIL 5 MG PO TABS
5.0000 mg | ORAL_TABLET | Freq: Two times a day (BID) | ORAL | Status: DC
  Administered 2019-02-10: 5 mg via ORAL
  Filled 2019-02-10: qty 1

## 2019-02-10 MED ORDER — TIZANIDINE HCL 4 MG PO TABS
4.0000 mg | ORAL_TABLET | Freq: Every day | ORAL | Status: DC
  Administered 2019-02-10: 8 mg via ORAL
  Filled 2019-02-10 (×2): qty 2

## 2019-02-10 MED ORDER — BUPROPION HCL ER (XL) 150 MG PO TB24
150.0000 mg | ORAL_TABLET | Freq: Every day | ORAL | Status: DC
  Administered 2019-02-11: 150 mg via ORAL
  Filled 2019-02-10: qty 1

## 2019-02-10 MED ORDER — MORPHINE SULFATE (PF) 4 MG/ML IV SOLN
4.0000 mg | Freq: Once | INTRAVENOUS | Status: AC
  Administered 2019-02-10: 4 mg via INTRAVENOUS
  Filled 2019-02-10: qty 1

## 2019-02-10 MED ORDER — MORPHINE SULFATE ER 15 MG PO TBCR
15.0000 mg | EXTENDED_RELEASE_TABLET | Freq: Three times a day (TID) | ORAL | Status: DC
  Administered 2019-02-10 – 2019-02-11 (×4): 15 mg via ORAL
  Filled 2019-02-10 (×6): qty 1

## 2019-02-10 MED ORDER — CLOPIDOGREL BISULFATE 75 MG PO TABS
75.0000 mg | ORAL_TABLET | Freq: Every day | ORAL | Status: DC
  Administered 2019-02-11: 75 mg via ORAL
  Filled 2019-02-10: qty 1

## 2019-02-10 MED ORDER — SODIUM CHLORIDE 0.9% FLUSH
3.0000 mL | Freq: Once | INTRAVENOUS | Status: AC
  Administered 2019-02-10: 3 mL via INTRAVENOUS

## 2019-02-10 MED ORDER — CARVEDILOL 6.25 MG PO TABS
6.2500 mg | ORAL_TABLET | Freq: Two times a day (BID) | ORAL | Status: DC
  Administered 2019-02-10: 6.25 mg via ORAL
  Filled 2019-02-10: qty 1

## 2019-02-10 MED ORDER — SPIRONOLACTONE 25 MG PO TABS
12.5000 mg | ORAL_TABLET | Freq: Two times a day (BID) | ORAL | Status: DC
  Administered 2019-02-10: 12.5 mg via ORAL
  Filled 2019-02-10 (×2): qty 0.5
  Filled 2019-02-10: qty 1

## 2019-02-10 MED ORDER — IOPAMIDOL (ISOVUE-370) INJECTION 76%
100.0000 mL | Freq: Once | INTRAVENOUS | Status: AC | PRN
  Administered 2019-02-10: 100 mL via INTRAVENOUS

## 2019-02-10 MED ORDER — ONDANSETRON HCL 4 MG/2ML IJ SOLN
4.0000 mg | Freq: Four times a day (QID) | INTRAMUSCULAR | Status: DC | PRN
  Administered 2019-02-10 (×2): 4 mg via INTRAVENOUS
  Filled 2019-02-10: qty 2

## 2019-02-10 MED ORDER — NITROGLYCERIN 0.4 MG SL SUBL
0.4000 mg | SUBLINGUAL_TABLET | SUBLINGUAL | Status: DC | PRN
  Administered 2019-02-10 (×2): 0.4 mg via SUBLINGUAL
  Filled 2019-02-10: qty 1

## 2019-02-10 MED ORDER — POTASSIUM CHLORIDE CRYS ER 20 MEQ PO TBCR
20.0000 meq | EXTENDED_RELEASE_TABLET | Freq: Two times a day (BID) | ORAL | Status: AC
  Administered 2019-02-10 – 2019-02-11 (×2): 20 meq via ORAL
  Filled 2019-02-10 (×2): qty 1

## 2019-02-10 MED ORDER — LIDOCAINE 5 % EX PTCH
2.0000 | MEDICATED_PATCH | Freq: Two times a day (BID) | CUTANEOUS | Status: DC | PRN
  Administered 2019-02-10: 2 via TRANSDERMAL
  Filled 2019-02-10 (×2): qty 3

## 2019-02-10 MED ORDER — FUROSEMIDE 20 MG PO TABS: 20.0000 mg | ORAL_TABLET | Freq: Every day | ORAL | Status: DC | PRN

## 2019-02-10 MED ORDER — ASPIRIN 81 MG PO CHEW
81.0000 mg | CHEWABLE_TABLET | Freq: Every day | ORAL | Status: DC
  Administered 2019-02-11: 81 mg via ORAL
  Filled 2019-02-10: qty 1

## 2019-02-10 MED ORDER — FENTANYL CITRATE (PF) 100 MCG/2ML IJ SOLN
50.0000 ug | Freq: Once | INTRAMUSCULAR | Status: AC
  Administered 2019-02-10: 50 ug via INTRAVENOUS
  Filled 2019-02-10: qty 2

## 2019-02-10 MED ORDER — ACETAMINOPHEN 650 MG RE SUPP: 650.0000 mg | Freq: Four times a day (QID) | RECTAL | Status: DC | PRN

## 2019-02-10 MED ORDER — ONDANSETRON HCL 4 MG/2ML IJ SOLN
4.0000 mg | Freq: Once | INTRAMUSCULAR | Status: AC
  Administered 2019-02-10: 4 mg via INTRAVENOUS
  Filled 2019-02-10: qty 2

## 2019-02-10 MED ORDER — POTASSIUM CHLORIDE CRYS ER 20 MEQ PO TBCR
20.0000 meq | EXTENDED_RELEASE_TABLET | Freq: Every day | ORAL | Status: DC
  Administered 2019-02-11: 20 meq via ORAL
  Filled 2019-02-10: qty 1

## 2019-02-10 MED ORDER — METOCLOPRAMIDE HCL 5 MG/ML IJ SOLN
5.0000 mg | Freq: Four times a day (QID) | INTRAMUSCULAR | Status: DC | PRN
  Administered 2019-02-10: 5 mg via INTRAVENOUS
  Filled 2019-02-10: qty 2

## 2019-02-10 MED ORDER — ACETAMINOPHEN 325 MG PO TABS: 650.0000 mg | ORAL_TABLET | Freq: Four times a day (QID) | ORAL | Status: DC | PRN

## 2019-02-10 MED ORDER — ONDANSETRON HCL 4 MG PO TABS: 4.0000 mg | ORAL_TABLET | Freq: Four times a day (QID) | ORAL | Status: DC | PRN

## 2019-02-10 MED ORDER — ATORVASTATIN CALCIUM 20 MG PO TABS
40.0000 mg | ORAL_TABLET | Freq: Every day | ORAL | Status: DC
  Administered 2019-02-11: 40 mg via ORAL
  Filled 2019-02-10: qty 2

## 2019-02-10 MED ORDER — CARVEDILOL 6.25 MG PO TABS
6.2500 mg | ORAL_TABLET | Freq: Two times a day (BID) | ORAL | Status: DC
  Administered 2019-02-11: 6.25 mg via ORAL
  Filled 2019-02-10: qty 1

## 2019-02-10 MED ORDER — PROCHLORPERAZINE EDISYLATE 10 MG/2ML IJ SOLN
5.0000 mg | INTRAMUSCULAR | Status: DC | PRN
  Filled 2019-02-10: qty 1

## 2019-02-10 MED ORDER — HYDRALAZINE HCL 20 MG/ML IJ SOLN
10.0000 mg | Freq: Once | INTRAMUSCULAR | Status: AC
  Administered 2019-02-10: 10 mg via INTRAVENOUS
  Filled 2019-02-10: qty 1

## 2019-02-10 MED ORDER — ONDANSETRON HCL 4 MG/2ML IJ SOLN
INTRAMUSCULAR | Status: AC
  Administered 2019-02-10: 4 mg via INTRAVENOUS
  Filled 2019-02-10: qty 2

## 2019-02-10 MED ORDER — ENOXAPARIN SODIUM 40 MG/0.4ML ~~LOC~~ SOLN: 40.0000 mg | SUBCUTANEOUS | Status: DC

## 2019-02-10 NOTE — Telephone Encounter (Signed)
Pt c/o of Chest Pain: STAT if CP now or developed within 24 hours  1. Are you having CP right now? Pain in right shoulder blade radiating under breast 2. Are you experiencing any other symptoms (ex. SOB, nausea, vomiting, sweating)? Nauseated, diarrhea.  3. How long have you been experiencing CP? Since late last night  4. Is your CP continuous or coming and going? continual  5. Have you taken Nitroglycerin? no ?

## 2019-02-10 NOTE — ED Notes (Signed)
US at bedside

## 2019-02-10 NOTE — H&P (Signed)
[employer] [facility] [place] [facility]    PATIENT NAME: Crystal Roberson    MR#:  [id number]  DATE OF BIRTH:  13-Apr-1966  DATE OF ADMISSION:  02/10/2019  PRIMARY CARE PHYSICIAN: Glori Luis, MD   REQUESTING/REFERRING PHYSICIAN: Dr. Minna Antis  CHIEF COMPLAINT:   Chief Complaint  Patient presents with  . Chest Pain    HISTORY OF PRESENT ILLNESS:  Crystal Roberson  is a 53 y.o. female with a known history of coronary artery disease status post previous stent, ischemic cardiomyopathy EF of 30 to 35%, chronic systolic CHF, depression, chronic back pain who presents to the hospital complaining of chest pain.  Patient describes her pain as a sharp pain located in the right side of her chest nonradiating and not associated with any nausea or vomiting or diaphoresis or palpitations or syncope.  Patient did develop some diarrhea yesterday but no other associated symptoms.  She denies any fevers chills, cough or any recent travel history or any recent sick contacts.  Given her significant cardiac history and ongoing chest pain hospitalist services were contacted for admission.  Patient's EKG shows no acute ST or T wave changes and her first set of cardiac markers are negative.  PAST MEDICAL HISTORY:   Past Medical History:  Diagnosis Date  . Allergy   . CAD (coronary artery disease)    a. 08/2016 Ant STEMI/PCI: LAD 63m (2.25x20 Promus Premier DES); b. 04/2017 Cath: LM nl, LAD 20ost, 70ost->vasospasm, Patent mid stent, 50d, D1/2/3 small, LCX 20ost, 60m, OM1/2/3 small, RCA 72m, 20d, EF 30-35%, mild to mod MR.  . Chicken pox   . Chronic back pain   . Chronic fatigue   . DDD (degenerative disc disease), lumbar   . Depression   . HFrEF (heart failure with reduced ejection fraction) (HCC)    a. 01/2017 Echo: Ef 35-40%, septal apical, distal inf, anterior HK. Mod dil LV. Mild to mod MR. mildly dil LA; b. 04/2017 LV Gram: EF 30-35%.  . Hyperlipidemia LDL goal <70   . Hypertension   .  Ischemic cardiomyopathy    a. 01/2017 Echo: EF 35-40%; b. 04/2017 LV Gram: EF 30-35%; c. 05/2017 s/p SJM 1411-36Q Ellipse VR RV only AICD.  . Migraines   . Syncope 09/12/2016    PAST SURGICAL HISTORY:   Past Surgical History:  Procedure Laterality Date  . CARDIAC CATHETERIZATION N/A 09/23/2016   Procedure: Left Heart Cath and Coronary Angiography;  Surgeon: Yvonne Kendall, MD;  Location: [facility] INVASIVE CV LAB;  Service: Cardiovascular;  Laterality: N/A;  . CARDIAC CATHETERIZATION N/A 09/23/2016   Procedure: Coronary Stent Intervention;  Surgeon: Yvonne Kendall, MD;  Location: [facility] INVASIVE CV LAB;  Service: Cardiovascular;  Laterality: N/A;  . CERVICAL DISC SURGERY    . ICD IMPLANT N/A 05/31/2017   Procedure: ICD Implant;  Surgeon: Regan Lemming, MD;  Location: [facility] INVASIVE CV LAB;  Service: Cardiovascular;  Laterality: N/A;  . RIGHT/LEFT HEART CATH AND CORONARY ANGIOGRAPHY N/A 04/26/2017   Procedure: Right/Left Heart Cath and Coronary Angiography;  Surgeon: Yvonne Kendall, MD;  Location: [facility] INVASIVE CV LAB;  Service: Cardiovascular;  Laterality: N/A;  . TUBAL LIGATION    . ULTRASOUND GUIDANCE FOR VASCULAR ACCESS  04/26/2017   Procedure: Ultrasound Guidance For Vascular Access;  Surgeon: Yvonne Kendall, MD;  Location: [facility] INVASIVE CV LAB;  Service: Cardiovascular;;    SOCIAL HISTORY:   Social History   Tobacco Use  . Smoking status: Former Smoker    Packs/day: 0.75  Years: 30.00    Pack years: 22.50    Types: Cigarettes    Last attempt to quit: 09/23/2016    Years since quitting: 2.3  . Smokeless tobacco: Never Used  Substance Use Topics  . Alcohol use: No    Alcohol/week: 0.0 standard drinks    FAMILY HISTORY:   Family History  Problem Relation Age of Onset  . Hypertension Mother   . Arthritis Mother   . Hyperlipidemia Mother   . Mental illness Mother   . Hypertension Maternal Grandmother   . Heart disease Maternal Grandmother     DRUG ALLERGIES:   Allergies   Allergen Reactions  . Pregabalin Shortness Of Breath, Swelling, Palpitations and Other (See Comments)    Other reaction(s): Tachycardia (finding)  . Codeine Nausea And Vomiting    REVIEW OF SYSTEMS:   Review of Systems  Constitutional: Negative for fever and weight loss.  HENT: Negative for congestion, nosebleeds and tinnitus.   Eyes: Negative for blurred vision, double vision and redness.  Respiratory: Positive for shortness of breath. Negative for cough and hemoptysis.   Cardiovascular: Positive for chest pain. Negative for orthopnea, leg swelling and PND.  Gastrointestinal: Positive for diarrhea. Negative for abdominal pain, melena, nausea and vomiting.  Genitourinary: Negative for dysuria, hematuria and urgency.  Musculoskeletal: Negative for falls and joint pain.  Neurological: Negative for dizziness, tingling, sensory change, focal weakness, seizures, weakness and headaches.  Endo/Heme/Allergies: Negative for polydipsia. Does not bruise/bleed easily.  Psychiatric/Behavioral: Negative for depression and memory loss. The patient is not nervous/anxious.     MEDICATIONS AT HOME:   Prior to Admission medications   Medication Sig Start Date End Date Taking? Authorizing Provider  aspirin 81 MG chewable tablet Chew 1 tablet (81 mg total) by mouth daily. 09/27/16  Yes Bhagat, Bhavinkumar, PA  atorvastatin (LIPITOR) 40 MG tablet TAKE 1 TABLET BY MOUTH ONCE DAILY Patient taking differently: Take 40 mg by mouth daily.  12/20/18  Yes End, Cristal Deerhristopher, MD  buPROPion (WELLBUTRIN XL) 150 MG 24 hr tablet TAKE 1 TABLET BY MOUTH ONCE A DAY Patient taking differently: Take 150 mg by mouth daily.  08/12/18  Yes Glori LuisSonnenberg, Eric G, MD  carvedilol (COREG) 6.25 MG tablet Take 1 tablet (6.25 mg total) by mouth 2 (two) times daily. 01/21/18  Yes Jene EveryKinner, Robert, MD  clopidogrel (PLAVIX) 75 MG tablet TAKE 1 TABLET BY MOUTH ONCE A DAY Patient taking differently: Take 75 mg by mouth daily.  08/01/18  Yes End,  Cristal Deerhristopher, MD  furosemide (LASIX) 20 MG tablet Take 1 tablet (20 mg) by mouth every day for 1 week, then take 1 tablet (20 mg) by mouth every other day. Patient taking differently: Take 20 mg by mouth daily as needed for fluid.  12/19/17  Yes End, Cristal Deerhristopher, MD  lidocaine (LIDODERM) 5 % Place 2-3 patches onto the skin every 12 (twelve) hours as needed (pain). Remove & Discard patch within 12 hours or as directed by MD    Yes [provider]  lisinopril (PRINIVIL,ZESTRIL) 5 MG tablet Take 1 tablet (5 mg total) by mouth 2 (two) times daily. 10/09/18 02/10/19 Yes End, Cristal Deerhristopher, MD  morphine (MS CONTIN) 15 MG 12 hr tablet Take 15 mg by mouth every 8 (eight) hours.    Yes [provider]  nitroGLYCERIN (NITROSTAT) 0.4 MG SL tablet Place 1 tablet (0.4 mg total) under the tongue every 5 (five) minutes as needed for chest pain (CP or SOB). 01/13/19  Yes End, Cristal Deerhristopher, MD  potassium chloride  SA (K-DUR,KLOR-CON) 20 MEQ tablet TAKE 1 TABLET BY MOUTH ONCE DAILY *IF YOU TAKE LASIX TAKE AN EXTRA DOSE AS DIRECTED* Patient taking differently: Take 20 mEq by mouth daily. (may take an extra dose, when taking furosemide) 04/16/18  Yes End, Cristal Deer, MD  spironolactone (ALDACTONE) 25 MG tablet Take 1/2 tablet (12.5 mg) by mouth twice daily 01/14/19  Yes End, Cristal Deer, MD  tiZANidine (ZANAFLEX) 4 MG tablet Take 4-8 mg by mouth daily.    Yes [provider]      VITAL SIGNS:  Blood pressure (!) 137/93, pulse 78, temperature 97.8 F (36.6 C), temperature source Oral, resp. rate 14, height 5\' 5"  (1.651 m), weight 79.4 kg, SpO2 98 %.  PHYSICAL EXAMINATION:  Physical Exam  GENERAL:  53 y.o.-year-old patient lying in the bed in no acute distress.  EYES: Pupils equal, round, reactive to light and accommodation. No scleral icterus. Extraocular muscles intact.  HEENT: Head atraumatic, normocephalic. Oropharynx and nasopharynx clear. No oropharyngeal erythema, moist oral mucosa  NECK:   Supple, no jugular venous distention. No thyroid enlargement, no tenderness.  LUNGS: Normal breath sounds bilaterally, no wheezing, rales, rhonchi. No use of accessory muscles of respiration.  CARDIOVASCULAR: S1, S2 RRR. No murmurs, rubs, gallops, clicks.  ABDOMEN: Soft, nontender, nondistended. Bowel sounds present. No organomegaly or mass.  EXTREMITIES: No pedal edema, cyanosis, or clubbing. + 2 pedal & radial pulses b/l.   NEUROLOGIC: Cranial nerves II through XII are intact. No focal Motor or sensory deficits appreciated b/l PSYCHIATRIC: The patient is alert and oriented x 3.  SKIN: No obvious rash, lesion, or ulcer.   LABORATORY PANEL:   CBC Recent Labs  Lab 02/10/19 0934  WBC 9.2  HGB 14.0  HCT 41.0  PLT 173   ------------------------------------------------------------------------------------------------------------------  Chemistries  Recent Labs  Lab 02/10/19 0934  NA 140  K 3.6  CL 107  CO2 25  GLUCOSE 143*  BUN 14  CREATININE 0.82  CALCIUM 8.9  AST 22  ALT 21  ALKPHOS 151*  BILITOT 0.7   ------------------------------------------------------------------------------------------------------------------  Cardiac Enzymes Recent Labs  Lab 02/10/19 0934  TROPONINI <0.03   ------------------------------------------------------------------------------------------------------------------  RADIOLOGY:  No results found.   IMPRESSION AND PLAN:   53 y.o. female with a known history of coronary artery disease status post previous stent, ischemic cardiomyopathy EF of 30 to 35%, chronic systolic CHF, depression, chronic back pain who presents to the hospital complaining of chest pain.  1.  Chest pain-patient symptoms are quite atypical as her pain is more located on the right side without any significant associated symptoms.  She does have a significant cardiac history given her previous coronary disease with stent placement and ischemic cardiomyopathy. - We will  observe her overnight on telemetry, cycle her cardiac markers, 4 sets negative. -EKG shows no acute ST or T wave changes.  We will get a cardiology consult, check an echocardiogram. -Continue aspirin, nitro, Plavix, carvedilol, lisinopril, atorvastatin.  2.  History of chronic systolic CHF-clinically patient is not in congestive heart failure. - Continue Lasix, carvedilol, lisinopril, Aldactone.  3.  Hyperlipidemia-continue atorvastatin.  4.  Depression-continue Wellbutrin.  5.  Chronic back pain-continue MS Contin.    All the records are reviewed and case discussed with ED provider. Management plans discussed with the patient, family and they are in agreement.  CODE STATUS: full code  TOTAL TIME TAKING CARE OF THIS PATIENT: 45 minutes.    Houston Siren M.D on 02/10/2019 at 1:45 PM  Between 7am to 6pm -  Pager - [phone]  After 6pm go to [email or web address] - password [email or web address] [facility]  [facility]  Office  [phone]  CC: Primary care physician; Glori Luis, MD

## 2019-02-10 NOTE — Telephone Encounter (Signed)
Returned the pt call. [email or web address] 

## 2019-02-10 NOTE — ED Provider Notes (Signed)
[facility] Emergency Department Provider Note  Time seen: 9:59 AM  I have reviewed the triage vital signs and the nursing notes.   HISTORY  Chief Complaint Chest Pain   HPI Crystal Roberson is a 53 y.o. female with a past medical history of CAD, status post stent in 2017, hypertension, hyperlipidemia, presents to the emergency department for right-sided chest pain.  According to the patient since last night she has been experiencing pain in her right chest/shoulder radiating to her right back.  Denies any pleuritic nature.  Denies any worsening with movement.  Patient denies any history of gastric reflux or heartburn.  Denies any shortness of breath cough congestion or fever.  Patient would describe her chest discomfort as moderate dull pain.  Patient states the discomfort does feel somewhat similar to her prior heart attacks however this is right-sided as opposed to left-sided during her prior MI.   Past Medical History:  Diagnosis Date  . Allergy   . CAD (coronary artery disease)    a. 08/2016 Ant STEMI/PCI: LAD 44m (2.25x20 Promus Premier DES); b. 04/2017 Cath: LM nl, LAD 20ost, 70ost->vasospasm, Patent mid stent, 50d, D1/2/3 small, LCX 20ost, 78m, OM1/2/3 small, RCA 76m, 20d, EF 30-35%, mild to mod MR.  . Chicken pox   . Chronic back pain   . Chronic fatigue   . DDD (degenerative disc disease), lumbar   . Depression   . HFrEF (heart failure with reduced ejection fraction) (HCC)    a. 01/2017 Echo: Ef 35-40%, septal apical, distal inf, anterior HK. Mod dil LV. Mild to mod MR. mildly dil LA; b. 04/2017 LV Gram: EF 30-35%.  . Hyperlipidemia LDL goal <70   . Hypertension   . Ischemic cardiomyopathy    a. 01/2017 Echo: EF 35-40%; b. 04/2017 LV Gram: EF 30-35%; c. 05/2017 s/p SJM 1411-36Q Ellipse VR RV only AICD.  . Migraines   . Syncope 09/12/2016    Patient Active Problem List   Diagnosis Date Noted  . Chronic fatigue 12/19/2017  . Hyperaldosteronism (HCC)  08/24/2017  . Arthralgia 05/07/2017  . Ischemic cardiomyopathy 04/14/2017  . Hyperlipidemia LDL goal <70 04/14/2017  . Syncope 03/30/2017  . Coronary artery disease 02/10/2017  . Chronic systolic heart failure (HCC) 11/26/2016  . Anxiety 01/13/2016  . Chronic pain 11/07/2015  . Essential hypertension 11/07/2015  . Insomnia 11/07/2015  . Depression 11/07/2015  . Facet syndrome, lumbar 03/11/2015  . Lumbar radiculopathy 03/11/2015  . Bilateral occipital neuralgia 02/07/2015  . 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 DDD (degenerative disc disease), lumbosacral 01/28/2015  . 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 CARDIAC CATHETERIZATION N/A 09/23/2016   Procedure: Left Heart Cath and Coronary Angiography;  Surgeon: Yvonne Kendall, MD;  Location: [facility] INVASIVE CV LAB;  Service: Cardiovascular;  Laterality: N/A;  . CARDIAC CATHETERIZATION N/A 09/23/2016   Procedure: Coronary Stent Intervention;  Surgeon: Yvonne Kendall, MD;  Location: [facility] INVASIVE CV LAB;  Service: Cardiovascular;  Laterality: N/A;  . CERVICAL DISC SURGERY    . ICD IMPLANT N/A 05/31/2017   Procedure: ICD Implant;  Surgeon: Regan Lemming, MD;  Location: [facility] INVASIVE CV LAB;  Service: Cardiovascular;  Laterality: N/A;  . RIGHT/LEFT HEART CATH AND CORONARY ANGIOGRAPHY N/A 04/26/2017   Procedure: Right/Left Heart Cath and Coronary Angiography;  Surgeon: Yvonne Kendall, MD;  Location: [facility] INVASIVE CV LAB;  Service: Cardiovascular;  Laterality: N/A;  . TUBAL LIGATION    . ULTRASOUND GUIDANCE FOR  VASCULAR ACCESS  04/26/2017   Procedure: Ultrasound Guidance For Vascular Access;  Surgeon: Yvonne KendallEnd, Christopher, MD;  Location: [facility] INVASIVE CV LAB;  Service: Cardiovascular;;    Prior to Admission medications   Medication Sig Start Date End Date Taking? Authorizing Provider  aspirin 81 MG chewable tablet Chew 1 tablet (81 mg total) by mouth daily. 09/27/16   Bhagat, Sharrell KuBhavinkumar, PA   atorvastatin (LIPITOR) 40 MG tablet TAKE 1 TABLET BY MOUTH ONCE DAILY 12/20/18   End, Cristal Deerhristopher, MD  buPROPion (WELLBUTRIN XL) 150 MG 24 hr tablet TAKE 1 TABLET BY MOUTH ONCE A DAY 08/12/18   Glori LuisSonnenberg, Eric G, MD  carvedilol (COREG) 6.25 MG tablet Take 1 tablet (6.25 mg total) by mouth 2 (two) times daily. 01/21/18   Jene EveryKinner, Robert, MD  clopidogrel (PLAVIX) 75 MG tablet TAKE 1 TABLET BY MOUTH ONCE A DAY 08/01/18   End, Cristal Deerhristopher, MD  furosemide (LASIX) 20 MG tablet Take 1 tablet (20 mg) by mouth every day for 1 week, then take 1 tablet (20 mg) by mouth every other day. Patient taking differently: Take 20 mg by mouth daily as needed. Take 1 tablet (20 mg) by mouth every day for 1 week, then take 1 tablet (20 mg) by mouth every other day. 12/19/17   End, Cristal Deerhristopher, MD  lidocaine (LIDODERM) 5 % Place 2-3 patches onto the skin every 12 (twelve) hours as needed (pain). Remove & Discard patch within 12 hours or as directed by MD     [provider]  lisinopril (PRINIVIL,ZESTRIL) 5 MG tablet Take 1 tablet (5 mg total) by mouth 2 (two) times daily. 10/09/18 01/13/19  End, Cristal Deerhristopher, MD  morphine (MS CONTIN) 15 MG 12 hr tablet Take 15 mg by mouth every 8 (eight) hours.     [provider]  nitroGLYCERIN (NITROSTAT) 0.4 MG SL tablet Place 1 tablet (0.4 mg total) under the tongue every 5 (five) minutes as needed for chest pain (CP or SOB). 01/13/19   End, Cristal Deerhristopher, MD  potassium chloride SA (K-DUR,KLOR-CON) 20 MEQ tablet TAKE 1 TABLET BY MOUTH ONCE DAILY *IF YOU TAKE LASIX TAKE AN EXTRA DOSE AS DIRECTED* 04/16/18   End, Cristal Deerhristopher, MD  spironolactone (ALDACTONE) 25 MG tablet Take 1/2 tablet (12.5 mg) by mouth twice daily 01/14/19   End, Cristal Deerhristopher, MD  tiZANidine (ZANAFLEX) 4 MG tablet Take 4-8 mg by mouth daily as needed for muscle spasms.    [provider]  zolpidem (AMBIEN) 10 MG tablet Take 1 tablet (10 mg total) by mouth at bedtime as needed. for sleep 11/29/17   Glori LuisSonnenberg,  Eric G, MD    Allergies  Allergen Reactions  . Pregabalin Shortness Of Breath, Swelling, Palpitations and Other (See Comments)    Other reaction(s): Tachycardia (finding)  . Codeine Nausea And Vomiting    Family History  Problem Relation Age of Onset  . Hypertension Mother   . Arthritis Mother   . Hyperlipidemia Mother   . Mental illness Mother   . Hypertension Maternal Grandmother   . Heart disease Maternal Grandmother     Social History Social History   Tobacco Use  . Smoking status: Former Smoker    Packs/day: 0.75    Years: 30.00    Pack years: 22.50    Types: Cigarettes    Last attempt to quit: 09/23/2016    Years since quitting: 2.3  . Smokeless tobacco: Never Used  Substance Use Topics  . Alcohol use: No    Alcohol/week: 0.0 standard drinks  . Drug  use: No    Review of Systems Constitutional: Negative for fever. Cardiovascular: Right-sided chest pain Respiratory: Negative for shortness of breath.  Negative for cough or congestion. Gastrointestinal: Negative for abdominal pain, vomiting Musculoskeletal: Negative for musculoskeletal complaints Skin: Negative for skin complaints  Neurological: Negative for headache All other ROS negative  ____________________________________________   PHYSICAL EXAM:  VITAL SIGNS: ED Triage Vitals  Enc Vitals Group     BP 02/10/19 0933 (!) 187/127     Pulse Rate 02/10/19 0933 85     Resp 02/10/19 0933 17     Temp 02/10/19 0933 97.8 F (36.6 C)     Temp Source 02/10/19 0933 Oral     SpO2 02/10/19 0933 98 %     Weight 02/10/19 0927 175 lb (79.4 kg)     Height 02/10/19 0927  (1.651 m)     Head Circumference --      Peak Flow --      Pain Score 02/10/19 0927 8     Pain Loc --      Pain Edu? --      Excl. in GC? --    Constitutional: Alert and oriented. Well appearing and in no distress. Eyes: Normal exam ENT      Head: Normocephalic and atraumatic.      Mouth/Throat: Mucous membranes are  moist. Cardiovascular: Normal rate, regular rhythm.  Respiratory: Normal respiratory effort without tachypnea nor retractions. Breath sounds are clear.  Chest wall is nontender to palpation. Gastrointestinal: Soft and nontender. No distention.  Specifically no right upper quadrant tenderness. Musculoskeletal: Nontender with normal range of motion in all extremities.  Neurologic:  Normal speech and language. No gross focal neurologic deficits Skin:  Skin is warm, dry and intact.  Psychiatric: Mood and affect are normal.  ____________________________________________    EKG EKG viewed and interpreted by myself shows a normal sinus rhythm at 85 bpm with a narrow QRS, normal axis, normal intervals, nonspecific ST changes.  ____________________________________________   INITIAL IMPRESSION / ASSESSMENT AND PLAN / ED COURSE  Pertinent labs & imaging results that were available during my care of the patient were reviewed by me and considered in my medical decision making (see chart for details).   Patient presents emergency department for right-sided chest pain since yesterday.  Patient has a history of a prior MI with stent placed 2 years ago.  Patient describes her discomfort as moderate.  No pleuritic nature to the chest pain.  We will check labs including cardiac enzymes, EKG and a chest x-ray.  Patient sees Dr. Okey Dupre as her cardiologist.  Differential at this time would include ACS, angina, chest wall pain, reflux or gastric pain.  We will add on a hepatic function panel and lipase as a precaution.  Patient's work-up is reassuring, troponin negative.  EKG reassuring.  However patient's pain is increased once again to an 8 out of 10.  We will re-dose pain medication.  Given the patient's high risk history we will admit to the hospitalist for further treatment.  Jeriann D Hurwitz was evaluated in Emergency Department on 02/10/2019 for the symptoms described in the history of present illness. She was  evaluated in the context of the global COVID-19 pandemic, which necessitated consideration that the patient might be at risk for infection with the SARS-CoV-2 virus that causes COVID-19. Institutional protocols and algorithms that pertain to the evaluation of patients at risk for COVID-19 are in a state of rapid change based on information released by regulatory  bodies including the CDC and federal and state organizations. These policies and algorithms were followed during the patient's care in the ED.  ____________________________________________   FINAL CLINICAL IMPRESSION(S) / ED DIAGNOSES  Chest pain   Minna Antis, MD 02/10/19 1218

## 2019-02-10 NOTE — ED Notes (Signed)
Patient transported to X-ray 

## 2019-02-10 NOTE — Consult Note (Signed)
Cardiology Consultation:   Patient ID: Crystal Piermy D Franchini MRN: [id number]; DOB: June 26, 1966  Admit date: 02/10/2019 Date of Consult: 02/10/2019  Primary Care Provider: Glori LuisSonnenberg, Eric G, MD Primary Cardiologist: Yvonne Kendallhristopher End, MD  Primary Electrophysiologist:  None    Patient Profile:   Crystal Roberson is a 53 y.o. female with a hx of h/o CAD s/p anterior STEMI in 08/2016, chronic systolic heart failure d/t ICM s/p ICD 05/2016, HTN with suspicion for hyperaldosteronism, history of tobacco abuse, migraine headaches, degenerative disc disease with chronic back pain, and depression who is being seen today for the evaluation of chest pain at the request of Dr. Cherlynn KaiserSainani.  History of Present Illness:   [name] is a 53 yo female with h/o CAD s/p anterior STEMI in 08/2016. She has a history of chronic back pain. She also has a history of patient reported labile BP with intermittent hypotension and lightheadedness, which is a chronic issues. She is followed by [facility] with elevated BP at most visits.    On 09/23/2016, she awoke early in a.m. with chest and back pain associated with nausea and emesis. She also reported a syncopal event thought likely d/t orthostatic hypotension. She was admitted with anterior STEMI and 99% mLAD stenosis treated with DES, as well as mild to moderate non-obstructive CAD involving the ostial and distal LAD, ostial and mLCx and mid/distal RCA. LVEF 25-35%. LVEDP 27mmHg. She was put on DAPT, BB, and aggressive secondary prevention. The hospital course was complicated by bleeding from her R femoral arteriotomy site and low BP limiting titration of evidence based heart failure therapy. Reassesment of the cath site showed no clinical evidence of pseudoaneurysm. After her cath, she continued to note lightheadedness and low BP, as well as SOB with mild activity and leg edema.   She underwent repeat R/L cath 04/2017 to reassess her CAD and better understand her hemodynamics. This  revealed a widely patent mLAD stent with moderate nonobstructive CAD. LVEF was notable for large anterior and apical wall motion abnormality with EF 30-35%. L/R heart filling pressures were upper normal. Fick CO was low normal.   She was seen in clinic January 2020, at which time she noted occasional brief episodes of chest tightness, unrelated to exertion, and unchanged since previous visits.  She noted these episodes often worsened when lying down at night.  She also reported episodes of clamminess, slight lightheadedness, and nausea. She reported these episodes lasted a couple of minutes and were not associated with exertion. On 01/13/2019, she had a virtual visit and reported increasing episodes of fatigue, shortness of breath, and vague chest tightness. She was unable to identify any triggers.  She had also noted occasional lower extremity edema, alleviated with Lasix 20 mg daily as needed.  She still reported SOB. Her 1-2 pillow orthopnea was stable, as well as her weight.  She was walking several blocks but reported she fatigued easily. Her BP was still fluctuating though she reported medication compliance. No reported alcohol, tobacco, or illegal drug use.   On 02/09/2019, she was sitting in her recliner chair and watching television ~12AM when she felt 10/10 non-pleuritic pain that started in her right shoulder blade and radiated around her right flank and under her right breast. She stated this pain was not related to eating with her last meal ~ 7PM. She denied any recent physical activity or lifting. She stated she had never had pain like this before; however, she did note that it mirrored the pain on the  left side that she had with her anterior STEMI. She stated the pain was constant since onset with occasional sharp twinges to her right breast area. She denied any alleviating or aggravating factors; however, on exam, appeared to be in more pain with changes in position. Associated symptoms included  emesis x3 and loose stool movement, during which time she felt clammy and light headed. No recent diet changes or sick contacts. In the ED, she rated her pain 8/10. She had not taken SL nitro, as she feared taking this medication. Vitals were significant for elevated BP with SBP 187-150s and diastolic BP 100-60s. HR 60-80s. Labs showed hypokalemia with K 3.6, Cr 0.82, BUN 14, AST 22, ALT 21. Troponin negative. EKG without acute ST/T changes. COVID-19 negative. CXR without acute changes. Fentanyl and morphine administered with some relief of CP.   Past Medical History:  Diagnosis Date   Allergy    CAD (coronary artery disease)    a. 08/2016 Ant STEMI/PCI: LAD 41m (2.25x20 Promus Premier DES); b. 04/2017 Cath: LM nl, LAD 20ost, 70ost->vasospasm, Patent mid stent, 50d, D1/2/3 small, LCX 20ost, 58m, OM1/2/3 small, RCA 53m, 20d, EF 30-35%, mild to mod MR.   Chicken pox    Chronic back pain    Chronic fatigue    DDD (degenerative disc disease), lumbar    Depression    HFrEF (heart failure with reduced ejection fraction) (HCC)    a. 01/2017 Echo: Ef 35-40%, septal apical, distal inf, anterior HK. Mod dil LV. Mild to mod MR. mildly dil LA; b. 04/2017 LV Gram: EF 30-35%.   Hyperlipidemia LDL goal <70    Hypertension    Ischemic cardiomyopathy    a. 01/2017 Echo: EF 35-40%; b. 04/2017 LV Gram: EF 30-35%; c. 05/2017 s/p SJM [id number] Ellipse VR RV only AICD.   Migraines    Syncope 09/12/2016    Past Surgical History:  Procedure Laterality Date   CARDIAC CATHETERIZATION N/A 09/23/2016   Procedure: Left Heart Cath and Coronary Angiography;  Surgeon: Yvonne Kendall, MD;  Location: [facility] INVASIVE CV LAB;  Service: Cardiovascular;  Laterality: N/A;   CARDIAC CATHETERIZATION N/A 09/23/2016   Procedure: Coronary Stent Intervention;  Surgeon: Yvonne Kendall, MD;  Location: [facility] INVASIVE CV LAB;  Service: Cardiovascular;  Laterality: N/A;   CERVICAL DISC SURGERY     ICD IMPLANT N/A 05/31/2017    Procedure: ICD Implant;  Surgeon: Regan Lemming, MD;  Location: [facility] INVASIVE CV LAB;  Service: Cardiovascular;  Laterality: N/A;   RIGHT/LEFT HEART CATH AND CORONARY ANGIOGRAPHY N/A 04/26/2017   Procedure: Right/Left Heart Cath and Coronary Angiography;  Surgeon: Yvonne Kendall, MD;  Location: [facility] INVASIVE CV LAB;  Service: Cardiovascular;  Laterality: N/A;   TUBAL LIGATION     ULTRASOUND GUIDANCE FOR VASCULAR ACCESS  04/26/2017   Procedure: Ultrasound Guidance For Vascular Access;  Surgeon: Yvonne Kendall, MD;  Location: [facility] INVASIVE CV LAB;  Service: Cardiovascular;;     Home Medications:  Prior to Admission medications   Medication Sig Start Date End Date Taking? Authorizing Provider  aspirin 81 MG chewable tablet Chew 1 tablet (81 mg total) by mouth daily. 09/27/16  Yes Bhagat, Bhavinkumar, PA  atorvastatin (LIPITOR) 40 MG tablet TAKE 1 TABLET BY MOUTH ONCE DAILY Patient taking differently: Take 40 mg by mouth daily.  12/20/18  Yes End, Cristal Deer, MD  buPROPion (WELLBUTRIN XL) 150 MG 24 hr tablet TAKE 1 TABLET BY MOUTH ONCE A DAY Patient taking differently: Take 150 mg by mouth daily.  08/12/18  Yes  Glori Luis, MD  carvedilol (COREG) 6.25 MG tablet Take 1 tablet (6.25 mg total) by mouth 2 (two) times daily. 01/21/18  Yes Jene Every, MD  clopidogrel (PLAVIX) 75 MG tablet TAKE 1 TABLET BY MOUTH ONCE A DAY Patient taking differently: Take 75 mg by mouth daily.  08/01/18  Yes End, Cristal Deer, MD  furosemide (LASIX) 20 MG tablet Take 1 tablet (20 mg) by mouth every day for 1 week, then take 1 tablet (20 mg) by mouth every other day. Patient taking differently: Take 20 mg by mouth daily as needed for fluid.  12/19/17  Yes End, Cristal Deer, MD  lidocaine (LIDODERM) 5 % Place 2-3 patches onto the skin every 12 (twelve) hours as needed (pain). Remove & Discard patch within 12 hours or as directed by MD    Yes [provider]  lisinopril (PRINIVIL,ZESTRIL) 5 MG tablet Take 1  tablet (5 mg total) by mouth 2 (two) times daily. 10/09/18 02/10/19 Yes End, Cristal Deer, MD  morphine (MS CONTIN) 15 MG 12 hr tablet Take 15 mg by mouth every 8 (eight) hours.    Yes [provider]  nitroGLYCERIN (NITROSTAT) 0.4 MG SL tablet Place 1 tablet (0.4 mg total) under the tongue every 5 (five) minutes as needed for chest pain (CP or SOB). 01/13/19  Yes End, Cristal Deer, MD  potassium chloride SA (K-DUR,KLOR-CON) 20 MEQ tablet TAKE 1 TABLET BY MOUTH ONCE DAILY *IF YOU TAKE LASIX TAKE AN EXTRA DOSE AS DIRECTED* Patient taking differently: Take 20 mEq by mouth daily. (may take an extra dose, when taking furosemide) 04/16/18  Yes End, Cristal Deer, MD  spironolactone (ALDACTONE) 25 MG tablet Take 1/2 tablet (12.5 mg) by mouth twice daily 01/14/19  Yes End, Cristal Deer, MD  tiZANidine (ZANAFLEX) 4 MG tablet Take 4-8 mg by mouth daily.    Yes [provider]    Inpatient Medications: Scheduled Meds:  Continuous Infusions:  PRN Meds:   Allergies:    Allergies  Allergen Reactions   Pregabalin Shortness Of Breath, Swelling, Palpitations and Other (See Comments)    Other reaction(s): Tachycardia (finding)   Codeine Nausea And Vomiting    Social History:   Social History   Socioeconomic History   Marital status: Divorced    Spouse name: Not on file   Number of children: Not on file   Years of education: Not on file   Highest education level: Not on file  Occupational History   Not on file  Social Needs   Financial resource strain: Not on file   Food insecurity:    Worry: Not on file    Inability: Not on file   Transportation needs:    Medical: Not on file    Non-medical: Not on file  Tobacco Use   Smoking status: Former Smoker    Packs/day: 0.75    Years: 30.00    Pack years: 22.50    Types: Cigarettes    Last attempt to quit: 09/23/2016    Years since quitting: 2.3   Smokeless tobacco: Never Used  Substance and Sexual Activity   Alcohol  use: No    Alcohol/week: 0.0 standard drinks   Drug use: No   Sexual activity: Not Currently  Lifestyle   Physical activity:    Days per week: Not on file    Minutes per session: Not on file   Stress: Not on file  Relationships   Social connections:    Talks on phone: Not on file    Gets together: Not  on file    Attends religious service: Not on file    Active member of club or organization: Not on file    Attends meetings of clubs or organizations: Not on file    Relationship status: Not on file   Intimate partner violence:    Fear of current or ex partner: Not on file    Emotionally abused: Not on file    Physically abused: Not on file    Forced sexual activity: Not on file  Other Topics Concern   Not on file  Social History Narrative   Not on file    Family History:    Family History  Problem Relation Age of Onset   Hypertension Mother    Arthritis Mother    Hyperlipidemia Mother    Mental illness Mother    Hypertension Maternal Grandmother    Heart disease Maternal Grandmother      ROS:  Please see the history of present illness.  Review of Systems  Constitutional: Positive for diaphoresis and malaise/fatigue. Negative for chills and fever.  Respiratory: Positive for shortness of breath. Negative for cough and hemoptysis.   Cardiovascular: Positive for chest pain, orthopnea and leg swelling. Negative for palpitations.       Stable orthopnea, reported LEE not seen on exam  Gastrointestinal: Positive for abdominal pain, constipation, diarrhea, nausea and vomiting. Negative for blood in stool and melena.       Reported both diarrhea and constipation  Genitourinary: Negative for dysuria and hematuria.  Musculoskeletal: Positive for back pain, joint pain, myalgias and neck pain. Negative for falls.       Chronic back pain  Neurological: Positive for dizziness. Negative for focal weakness, loss of consciousness and headaches.  Psychiatric/Behavioral:  Negative for substance abuse.  All other systems reviewed and are negative.   All other ROS reviewed and negative.     Physical Exam/Data:   Vitals:   02/10/19 1130 02/10/19 1200 02/10/19 1300 02/10/19 1400  BP: (!) 151/93 (!) 171/96 (!) 137/93 (!) 169/83  Pulse: 76 83 78 70  Resp: Temp:      TempSrc:      SpO2: 97% 99% 98% 98%  Weight:      Height:       No intake or output data in the 24 hours ending 02/10/19 1443 Filed Weights   02/10/19 0927  Weight: 79.4 kg   Body mass index is 29.12 kg/m.  General:  Well nourished, well developed, in no acute distress. Lying in bed HEENT: normal Neck: no JVD Vascular: No carotid bruits; FA pulses 2+ bilaterally without bruits  Cardiac:  normal S1, S2; RRR; no murmur  Lungs:  clear to auscultation bilaterally, slight expiratory wheezing, no rhonchi or rales  Abd: soft, nontender, no hepatomegaly. Some guarding noted  Ext: No lower extremity edema Musculoskeletal:  No deformities, BUE and BLE strength normal and equal Skin: warm and dry  Neuro:  CNs 2-12 intact, no focal abnormalities noted Psych:  Normal affect   EKG:  The EKG was personally reviewed and demonstrates:  SR, 85bpm, previous anterior infarct, no acute ST/T changes Telemetry:  Telemetry was personally reviewed and demonstrates:  SR, 80-90s  Relevant CV Studies: Cath/PCI:  LHC/RHC (04/26/17): LMCA normal. LAD with ostial vasospasm improved to 20% with intracoronary nitroglycerin. Patent stent in mid LAD. Distal LAD shows 50% stenosis, unchanged. There are 20% ostial and 30% mid LCx lesions. Sequential 40% and 20% mid and distal RCA stenoses present.  LVEDP 15-18 mmHg. LVEF 30-35% with anterior and apical akinesis. RA 6, RV 32/5, PA 32/12 (19), PCWP 17. AO sat 93%. PA sat 64%. Fick CO/CI 4.8/2.8.  LHC/PCI (09/23/16): Anterior STEMI with 99% mid LAD stenosis with TIMI 2 flow. Mild to moderate, nonobstructive CAD involving the ostial and distal LAD, ostial and mid  LCx, and mid/distal RCA. Severely reduced LV contraction (EF 25-35%) with anterior, apical, and apical inferior akinesis. Moderately elevated left ventricular filling pressure (LVEDP 27 mmHg).  EP Procedures and Devices:  ICD placement (05/31/17,Dr. Camnitz): St. Jude single-chamber deveice  Event monitor (01/2017): Final report pending preliminary review today demonstrates normal sinus rhythm with no arrhythmias during triggered events.  Non-Invasive Evaluation(s):  Limited TTE (01/23/17): Dilated left ventricle with normal wall thickness. LVEF 35-40% with apical anterior, apical septal, and apical inferior hypokinesis. Mild to moderate MR. Mild left atrial enlargement. Normal RV size and function.  Transthoracic echocardiogram (11/06/16): LVEF 30-35% with severe hypokinesis of mid/apical septal, anterior, and lateral walls, as well as the apex. Moderate to severe MR. Mild left atrial enlargment. Moderate pulmonary hypertension.  Transthoracic echocardiogram (09/13/16): Normal LV size and contraction (EF 60-65%). Normal diastolic function. Trivial mitral and tricuspid regurgitation. Normal RV size and function.  Laboratory Data:  Chemistry Recent Labs  Lab 02/10/19 0934  NA 140  K 3.6  CL 107  CO2 25  GLUCOSE 143*  BUN 14  CREATININE 0.82  CALCIUM 8.9  GFRNONAA >60  GFRAA >60  ANIONGAP 8    Recent Labs  Lab 02/10/19 0934  PROT 7.1  ALBUMIN 3.9  AST 22  ALT 21  ALKPHOS 151*  BILITOT 0.7   Hematology Recent Labs  Lab 02/10/19 0934  WBC 9.2  RBC 4.74  HGB 14.0  HCT 41.0  MCV 86.5  MCH 29.5  MCHC 34.1  RDW 11.5  PLT 173   Cardiac Enzymes Recent Labs  Lab 02/10/19 0934  TROPONINI <0.03   No results for input(s): TROPIPOC in the last 168 hours.  BNPNo results for input(s): BNP, PROBNP in the last 168 hours.  DDimer No results for input(s): DDIMER in the last 168 hours.  Radiology/Studies:  Dg Chest 2 View  Result Date: 02/10/2019 CLINICAL DATA:  Right  shoulder and breast pain since last night. Nausea and vomiting. EXAM: CHEST - 2 VIEW COMPARISON:  06/01/2017 radiographs. FINDINGS: The heart size and mediastinal contours are stable. The left sided cardiac pacemaker lead is unchanged. The lungs are clear. There is no pleural effusion or pneumothorax. No acute osseous findings are seen post lower cervical fusion. IMPRESSION: Stable postoperative chest.  No acute cardiopulmonary process. Electronically Signed   By: Carey Bullocks M.D.   On: 02/10/2019 14:17    Assessment and Plan:   Atypical Chest Pain, h/o Coronary artery disease - CP 8/10. Atypical story - right sided pain that wraps from shoulder blade to under right breast. Worse with movement. Different from previous anterior STEMI in 2017. - 2017 DES for anterior STEMI as in HPI. R/LHC as above 04/2017 that showed patent LAD stent without significant CAD. Chronic chest tightness / SOB since STEMI. - EKG without acute ST/T changes. Troponin negative. Continue to cycle troponin. Monitor on telemetry. Low suspicion for cardiac etiology given atypical story, no EKG changes, no troponin elevation but cannot completely rule out cardiac etiology given h/o CAD and risk factors including past history of smoking, uncontrolled HTN, HLD, HFrEF.  Consider also pain referred from chronic back issues, as well as GI etiology given  emesis, constipation, and loose stools. - Recommend continue to monitor. Cycle troponin. Pending echo results, ordered in ED. No plan for invasive ischemic workup unless bump in troponin or significant structural changes seen on echo with ongoing severe CP without relief. Could consider stress test for further risk stratification tomorrow morning if chest pain improved. Will also add on labs for further risk stratification. - Continue medical management with DAPT, Coreg 6.25mg  BID, spironolactone, ACE, SL nitro for chest pain (patient previously has turned down this medication). Could  consider addition of Imdur given elevated BP. Transition to [facility] or escalation of evidence based therapy has been limited by orthostatic hypotension. Aggressive risk factor modification with statin therapy. Follow-up as scheduled in the office  Chronic systolic heart failure secondary to ischemic cardiomyopathy - Appears euvolemic on exam.  - S/P ICD 05/2016 as above.  - Chronic SOB since STEMI per patient. Continue medical management with Coreg, lasix, spironolactone, ACE.  Aggressive risk factor modification with BP control given hypertension noted in ED. Could add imdur.   Hypokalemia - K 3.6. Replete with goal 4.0. AM BMET. Check Mg, goal 2.0.  HLD - LDL 09/2018 only slightly above goal at 72.  - Continue statin therapy with LDL goal <70.  - If not at goal when recheck lipids, consider escalating stating therapy from atorvastatin 40mg  daily to 80mg  daily.  HTN - BP suboptimally controlled. She confirmed wide fluctuations in BP with hypotension and near syncope symptoms at home.  - Continue medical management. Could consider adding Imdur if BP tolerates given orthostasis reported  - Consider 48h ambulatory BP monitoring as outpatient given labile BP     For questions or updates, please contact [facility] Please consult [email or web address] for contact info under     Signed, Lennon Alstrom, PA-C  02/10/2019 2:43 PM

## 2019-02-10 NOTE — Telephone Encounter (Signed)
2nd attempt. Review of chart. Pt is currently in the ED. FYI-fwd to Dr.End

## 2019-02-10 NOTE — ED Triage Notes (Signed)
Pt c/o pain in the right shoulder blade radiating around to the right breast since late last night with N/V and a couple loose stools with diaphoresis. States she has a hx of MI with similar sx but on the left side of chest.

## 2019-02-10 NOTE — Telephone Encounter (Signed)
Thank you for the update.  Faizan Geraci, MD [facility] Pager: [phone]  

## 2019-02-10 NOTE — ED Notes (Signed)
ED TO INPATIENT HANDOFF REPORT  ED Nurse Name and Phone #: Joice Loftsamber [phone]  S Name/Age/Gender Crystal Roberson 53 y.o. female Room/Bed: ED11A/ED11A  Code Status   Code Status: Prior  Home/SNF/Other Home Patient oriented to: self, place, time and situation Is this baseline? Yes   Triage Complete: Triage complete  Chief Complaint cp  Triage Note Pt c/o pain in the right shoulder blade radiating around to the right breast since late last night with N/V and a couple loose stools with diaphoresis. States she has a hx of MI with similar sx but on the left side of chest.   Allergies Allergies  Allergen Reactions  . Pregabalin Shortness Of Breath, Swelling, Palpitations and Other (See Comments)    Other reaction(s): Tachycardia (finding)  . Codeine Nausea And Vomiting    Level of Care/Admitting Diagnosis ED Disposition    ED Disposition Condition Comment   Admit  Hospital [facility]: [facility] [[id number]]  Level of Care: Telemetry [5]  Covid Evaluation: Screening Protocol (No Symptoms)  Diagnosis: Chest pain [454098][[id number]]  Admitting Physician: [place] SirenSAINANI, VIVEK J [[id number]]  Attending Physician: Houston SirenSAINANI, VIVEK J [[id number]]  PT Class (Do Not Modify): Observation [104]  PT Acc Code (Do Not Modify): Observation [10022]       B Medical/Surgery History Past Medical History:  Diagnosis Date  . Allergy   . CAD (coronary artery disease)    a. 08/2016 Ant STEMI/PCI: LAD 4533m (2.25x20 Promus Premier DES); b. 04/2017 Cath: LM nl, LAD 20ost, 70ost->vasospasm, Patent mid stent, 50d, D1/2/3 small, LCX 20ost, 2768m, OM1/2/3 small, RCA 347m, 20d, EF 30-35%, mild to mod MR.  . Chicken pox   . Chronic back pain   . Chronic fatigue   . DDD (degenerative disc disease), lumbar   . Depression   . HFrEF (heart failure with reduced ejection fraction) (HCC)    a. 01/2017 Echo: Ef 35-40%, septal apical, distal inf, anterior HK. Mod dil LV. Mild to mod MR. mildly dil LA; b. 04/2017 LV Gram: EF  30-35%.  . Hyperlipidemia LDL goal <70   . Hypertension   . Ischemic cardiomyopathy    a. 01/2017 Echo: EF 35-40%; b. 04/2017 LV Gram: EF 30-35%; c. 05/2017 s/p SJM 1411-36Q Ellipse VR RV only AICD.  . Migraines   . Syncope 09/12/2016   Past Surgical History:  Procedure Laterality Date  . CARDIAC CATHETERIZATION N/A 09/23/2016   Procedure: Left Heart Cath and Coronary Angiography;  Surgeon: Yvonne Kendallhristopher End, MD;  [facility];  Service: Cardiovascular;  Laterality: N/A;  . CARDIAC CATHETERIZATION N/A 09/23/2016   Procedure: Coronary Stent Intervention;  Surgeon: Yvonne Kendallhristopher End, MD;  Location: [facility] INVASIVE CV LAB;  Service: Cardiovascular;  Laterality: N/A;  . CERVICAL DISC SURGERY    . ICD IMPLANT N/A 05/31/2017   Procedure: ICD Implant;  Surgeon: Regan Lemmingamnitz, Will Martin, MD;  [facility];  Service: Cardiovascular;  Laterality: N/A;  . RIGHT/LEFT HEART CATH AND CORONARY ANGIOGRAPHY N/A 04/26/2017   Procedure: Right/Left Heart Cath and Coronary Angiography;  Surgeon: Yvonne KendallEnd, Christopher, MD;  Location: [facility] INVASIVE CV LAB;  Service: Cardiovascular;  Laterality: N/A;  . TUBAL LIGATION    . ULTRASOUND GUIDANCE FOR VASCULAR ACCESS  04/26/2017   Procedure: Ultrasound Guidance For Vascular Access;  Surgeon: Yvonne KendallEnd, Christopher, MD;  Location: [facility] INVASIVE CV LAB;  Service: Cardiovascular;;     A IV Location/Drains/Wounds Patient Lines/Drains/Airways Status   Active Line/Drains/Airways    Name:   Placement date:   Placement time:  Site:   Days:   Peripheral IV 02/10/19 Left Antecubital   02/10/19    0958    Antecubital   less than 1   Sheath 04/26/17 Right Venous;Brachial   04/26/17    0949    Venous;Brachial   655   Sheath 04/26/17 Right Arterial;Radial   04/26/17    1001    Arterial;Radial   655   Incision (Closed) 05/31/17 Shoulder Left;Upper   05/31/17    1815     620          Intake/Output Last 24 hours No intake or output data in the 24 hours ending 02/10/19  1552  Labs/Imaging Results for orders placed or performed during the hospital encounter of 02/10/19 (from the past 48 hour(s))  Basic metabolic panel     Status: Abnormal   Collection Time: 02/10/19  9:34 AM  Result Value Ref Range   Sodium 140 135 - 145 mmol/L   Potassium 3.6 3.5 - 5.1 mmol/L   Chloride 107 98 - 111 mmol/L   CO2 25 22 - 32 mmol/L   Glucose, Bld 143 (H) 70 - 99 mg/dL   BUN 14 6 - 20 mg/dL   Creatinine, Ser 5.42 0.44 - 1.00 mg/dL   Calcium 8.9 8.9 - 70.6 mg/dL   GFR calc non Af Amer >60 >60 mL/min   GFR calc Af Amer >60 >60 mL/min   Anion gap 8 5 - 15    Comment: Performed at [facility], [address] [place], [place] [address]  CBC     Status: None   Collection Time: 02/10/19  9:34 AM  Result Value Ref Range   WBC 9.2 4.0 - 10.5 K/uL   RBC 4.74 3.87 - 5.11 MIL/uL   Hemoglobin 14.0 12.0 - 15.0 g/dL   HCT 83.1 51.7 - 61.6 %   MCV 86.5 80.0 - 100.0 fL   MCH 29.5 26.0 - 34.0 pg   MCHC 34.1 30.0 - 36.0 g/dL   RDW 07.3 71.0 - 62.6 %   Platelets 173 150 - 400 K/uL   nRBC 0.0 0.0 - 0.2 %    Comment: Performed at [facility], [address] [place], [place] [address]  Troponin I - ONCE - STAT     Status: None   Collection Time: 02/10/19  9:34 AM  Result Value Ref Range   Troponin I <0.03 <0.03 ng/mL    Comment: Performed at [facility], [address] [place], [place] [address]  Hepatic function panel     Status: Abnormal   Collection Time: 02/10/19  9:34 AM  Result Value Ref Range   Total Protein 7.1 6.5 - 8.1 g/dL   Albumin 3.9 3.5 - 5.0 g/dL   AST 22 15 - 41 U/L   ALT 21 0 - 44 U/L   Alkaline Phosphatase 151 (H) 38 - 126 U/L   Total Bilirubin 0.7 0.3 - 1.2 mg/dL   Bilirubin, Direct <5.0 0.0 - 0.2 mg/dL   Indirect Bilirubin NOT CALCULATED 0.3 - 0.9 mg/dL    Comment: Performed at [facility], [address] [place], [place] [address]  Lipase, blood     Status: None   Collection Time: 02/10/19  9:34 AM   Result Value Ref Range   Lipase 23 11 - 51 U/L    Comment: Performed at [facility], [address], [place], [place] [address]  SARS Coronavirus 2 (CEPHEID - Performed in [facility] hospital lab), [facility]  Order     Status: None   Collection Time: 02/10/19 12:18 PM  Result Value Ref Range   SARS Coronavirus 2 NEGATIVE NEGATIVE    Comment: (NOTE) If result is NEGATIVE SARS-CoV-2 target nucleic acids are NOT DETECTED. The SARS-CoV-2 RNA is generally detectable in upper and lower  respiratory specimens during the acute phase of infection. The lowest  concentration of SARS-CoV-2 viral copies this assay can detect is 250  copies / mL. A negative result does not preclude SARS-CoV-2 infection  and should not be used as the sole basis for treatment or other  patient management decisions.  A negative result may occur with  improper specimen collection / handling, submission of specimen other  than nasopharyngeal swab, presence of viral mutation(s) within the  areas targeted by this assay, and inadequate number of viral copies  (<250 copies / mL). A negative result must be combined with clinical  observations, patient history, and epidemiological information. If result is POSITIVE SARS-CoV-2 target nucleic acids are DETECTED. The SARS-CoV-2 RNA is generally detectable in upper and lower  respiratory specimens dur ing the acute phase of infection.  Positive  results are indicative of active infection with SARS-CoV-2.  Clinical  correlation with patient history and other diagnostic information is  necessary to determine patient infection status.  Positive results do  not rule out bacterial infection or co-infection with other viruses. If result is PRESUMPTIVE POSTIVE SARS-CoV-2 nucleic acids MAY BE PRESENT.   A presumptive positive result was obtained on the submitted specimen  and confirmed on repeat testing.  While 2019 novel coronavirus  (SARS-CoV-2) nucleic acids may be present  in the submitted sample  additional confirmatory testing may be necessary for epidemiological  and / or clinical management purposes  to differentiate between  SARS-CoV-2 and other Sarbecovirus currently known to infect humans.  If clinically indicated additional testing with an alternate test  methodology [phone]) is advised. The SARS-CoV-2 RNA is generally  detectable in upper and lower respiratory sp ecimens during the acute  phase of infection. The expected result is Negative. Fact Sheet for Patients:  BoilerBrush.com.cy Fact Sheet for Healthcare Providers: https://pope.com/ This test is not yet approved or cleared by the [place] FDA and has been authorized for detection and/or diagnosis of SARS-CoV-2 by FDA under an Emergency Use Authorization (EUA).  This EUA will remain in effect (meaning this test can be used) for the duration of the COVID-19 declaration under Section 564(b)(1) of the Act, 21 U.S.C. section 360bbb-3(b)(1), unless the authorization is terminated or revoked sooner. Performed at [facility], [address] [place], [place] [address]    Dg Chest 2 View  Result Date: 02/10/2019 CLINICAL DATA:  Right shoulder and breast pain since last night. Nausea and vomiting. EXAM: CHEST - 2 VIEW COMPARISON:  06/01/2017 radiographs. FINDINGS: The heart size and mediastinal contours are stable. The left sided cardiac pacemaker lead is unchanged. The lungs are clear. There is no pleural effusion or pneumothorax. No acute osseous findings are seen post lower cervical fusion. IMPRESSION: Stable postoperative chest.  No acute cardiopulmonary process. Electronically Signed   By: Carey Bullocks M.D.   On: 02/10/2019 14:17    Pending Labs [employer] (From admission, onward)    Start     Ordered   Signed and Held  HIV antibody (Routine Testing)  Once,   R     Signed and Held   Signed and Held  CBC  (enoxaparin  (LOVENOX)    CrCl >/=  30 ml/min)  Once,   R    Comments:  Baseline for enoxaparin therapy IF NOT ALREADY DRAWN.  Notify MD if PLT < 100 K.    Signed and Held   Signed and Held  Creatinine, serum  (enoxaparin (LOVENOX)    CrCl >/= 30 ml/min)  Once,   R    Comments:  Baseline for enoxaparin therapy IF NOT ALREADY DRAWN.    Signed and Held   Signed and Held  Creatinine, serum  (enoxaparin (LOVENOX)    CrCl >/= 30 ml/min)  Weekly,   R    Comments:  while on enoxaparin therapy    Signed and Held   Signed and Held  Troponin I - Now Then Q4H  Now then every 4 hours,   R     Signed and Held          Vitals/Pain Today's Vitals   02/10/19 1400 02/10/19 1500 02/10/19 1530 02/10/19 1544  BP: (!) 169/83 (!) 151/68 (!) 152/90   Pulse: 70 82 81   Resp: Temp:      TempSrc:      SpO2: 98% 94% 96%   Weight:      Height:      PainSc:    9     Isolation Precautions No active isolations  Medications Medications  morphine (MS CONTIN) 12 hr tablet 15 mg (15 mg Oral Given 02/10/19 1509)  nitroGLYCERIN (NITROSTAT) SL tablet 0.4 mg (0.4 mg Sublingual Given 02/10/19 1549)  ondansetron (ZOFRAN) tablet 4 mg ( Oral See Alternative 02/10/19 1549)    Or  ondansetron (ZOFRAN) injection 4 mg (4 mg Intravenous Given 02/10/19 1549)  sodium chloride flush (NS) 0.9 % injection 3 mL (3 mLs Intravenous Given 02/10/19 1001)  morphine 4 MG/ML injection 4 mg (4 mg Intravenous Given 02/10/19 1047)  ondansetron (ZOFRAN) injection 4 mg (4 mg Intravenous Given 02/10/19 1047)  hydrALAZINE (APRESOLINE) injection 10 mg (10 mg Intravenous Given 02/10/19 1118)  fentaNYL (SUBLIMAZE) injection 50 mcg (50 mcg Intravenous Given 02/10/19 1226)    Mobility walks Low fall risk   Focused Assessments Cardiac Assessment Handoff:  Cardiac Rhythm: Normal sinus rhythm Lab Results  Component Value Date   TROPONINI <0.03 02/10/2019   No results found for: DDIMER Does the Patient currently have chest pain? Yes      R Recommendations: See Admitting Provider Note  Report given to:   Additional Notes:

## 2019-02-11 ENCOUNTER — Telehealth: Payer: Self-pay

## 2019-02-11 ENCOUNTER — Observation Stay (HOSPITAL_BASED_OUTPATIENT_CLINIC_OR_DEPARTMENT_OTHER)
Admit: 2019-02-11 | Discharge: 2019-02-11 | Disposition: A | Discharge status: Home / Self Care | Payer: Self-pay | Attending: Specialist | Admitting: Specialist

## 2019-02-11 DIAGNOSIS — I5022 Chronic systolic (congestive) heart failure: Secondary | ICD-10-CM

## 2019-02-11 DIAGNOSIS — I340 Nonrheumatic mitral (valve) insufficiency: Secondary | ICD-10-CM

## 2019-02-11 DIAGNOSIS — N179 Acute kidney failure, unspecified: Secondary | ICD-10-CM

## 2019-02-11 LAB — BASIC METABOLIC PANEL
Anion gap: 8 (ref 5–15)
Anion gap: 6 (ref 5–15)
BUN: 14 mg/dL (ref 6–20)
BUN: 15 mg/dL (ref 6–20)
CO2: 23 mmol/L (ref 22–32)
CO2: 29 mmol/L (ref 22–32)
Calcium: 9.1 mg/dL (ref 8.9–10.3)
Calcium: 9.2 mg/dL (ref 8.9–10.3)
Chloride: 108 mmol/L (ref 98–111)
Chloride: 103 mmol/L (ref 98–111)
Creatinine, Ser: 1.55 mg/dL — ABNORMAL HIGH (ref 0.44–1.00)
Creatinine, Ser: 1.26 mg/dL — ABNORMAL HIGH (ref 0.44–1.00)
GFR calc Af Amer: 43 mL/min — ABNORMAL LOW (ref >60)
GFR calc Af Amer: 56 mL/min — ABNORMAL LOW (ref >60)
GFR calc non Af Amer: 37 mL/min — ABNORMAL LOW (ref >60)
GFR calc non Af Amer: 48 mL/min — ABNORMAL LOW (ref >60)
Glucose, Bld: 131 mg/dL — ABNORMAL HIGH (ref 70–99)
Glucose, Bld: 105 mg/dL — ABNORMAL HIGH (ref 70–99)
Potassium: 4.1 mmol/L (ref 3.5–5.1)
Potassium: 4.5 mmol/L (ref 3.5–5.1)
Sodium: 139 mmol/L (ref 135–145)
Sodium: 138 mmol/L (ref 135–145)

## 2019-02-11 LAB — ECHOCARDIOGRAM COMPLETE
Height: 65 in
Weight: 2614.4 oz

## 2019-02-11 LAB — TROPONIN I: Troponin I: <0.03 ng/mL (ref <0.03)

## 2019-02-11 MED ORDER — SODIUM CHLORIDE 0.9 % IV SOLN
INTRAVENOUS | Status: AC
  Administered 2019-02-11: 13:00:00 via INTRAVENOUS

## 2019-02-11 MED ORDER — LISINOPRIL 5 MG PO TABS: 5.0000 mg | ORAL_TABLET | Freq: Every day | ORAL | 3 refills | Status: DC

## 2019-02-11 NOTE — Progress Notes (Signed)
Patient discharged home as ordered,instructions explained and well underastood,follow up appointments made,vital signs within normal upon discharge ,escorted by staff member via wheel chair

## 2019-02-11 NOTE — Discharge Summary (Addendum)
[facility] [place] [facility]   PATIENT NAME: Crystal Roberson    MR#:  [id number]  DATE OF BIRTH:  10-04-1965  DATE OF ADMISSION:  02/10/2019 ADMITTING PHYSICIAN: Houston Siren, MD  DATE OF DISCHARGE: 02/11/2019  PRIMARY CARE PHYSICIAN: Glori Luis, MD    ADMISSION DIAGNOSIS:  RUQ abdominal pain [R10.11] Chest pain, unspecified type [R07.9]  DISCHARGE DIAGNOSIS:  Active Problems:   Chest pain   AKI (acute kidney injury) (HCC)   SECONDARY DIAGNOSIS:   Past Medical History:  Diagnosis Date  . Allergy   . CAD (coronary artery disease)    a. 08/2016 Ant STEMI/PCI: LAD 58m (2.25x20 Promus Premier DES); b. 04/2017 Cath: LM nl, LAD 20ost, 70ost->vasospasm, Patent mid stent, 50d, D1/2/3 small, LCX 20ost, 23m, OM1/2/3 small, RCA 49m, 20d, EF 30-35%, mild to mod MR.  . Chicken pox   . Chronic back pain   . Chronic fatigue   . DDD (degenerative disc disease), lumbar   . Depression   . HFrEF (heart failure with reduced ejection fraction) (HCC)    a. 01/2017 Echo: Ef 35-40%, septal apical, distal inf, anterior HK. Mod dil LV. Mild to mod MR. mildly dil LA; b. 04/2017 LV Gram: EF 30-35%.  . Hyperlipidemia LDL goal <70   . Hypertension   . Ischemic cardiomyopathy    a. 01/2017 Echo: EF 35-40%; b. 04/2017 LV Gram: EF 30-35%; c. 05/2017 s/p SJM [id number] Ellipse VR RV only AICD.  . Migraines   . Syncope 09/12/2016    HOSPITAL COURSE:   53 y.o. female with a known history of coronary artery disease status post previous stent, ischemic cardiomyopathy EF of 30 to 35%, chronic systolic CHF, depression, chronic back pain who presents to the hospital complaining of chest pain.  1.  Chest pain-patient symptoms were quite atypical as her pain is more located on the right side without any significant associated symptoms.   -Patient was observed overnight on telemetry, had 3 sets of cardiac markers checked which were negative. - Patient also underwent CTA of the chest  abdomen pelvis with contrast which showed no evidence of pulmonary embolism or aortic dissection.  Her chest pain has now resolved with some IV pain medication. - Patient also had a right upper quadrant ultrasound which showed no evidence of acute cholecystitis but possible CBD stone but her LFTs were stable.  She is clinically asymptomatic right now. -Cardiologist consulted and did not recommend any further intervention.  Patient has had an echocardiogram done shows that her EF is improved to 40 to 45% with no other acute wall motion abnormality. - She will Continue aspirin, nitro, Plavix, carvedilol, lisinopril, atorvastatin.  2.  History of chronic systolic CHF-clinically patient was not in congestive heart failure while in the hospital. - She will Continue Lasix, carvedilol, lisinopril, Aldactone.  3.  Hyperlipidemia- pt. Will continue atorvastatin.  4.  Depression-pt. Will continue Wellbutrin.  5.  Chronic back pain- pt. Will continue MS Contin.  6.  Acute kidney injury- etiology unclear but suspected to be some ATN from dehydration from some nausea vomiting. -Improved with IV fluids.  Her Aldactone and lisinopril were held she is being advised to DC her Aldactone and reduce her lisinopril dose to daily and follow-up with cardiology in a week to get her renal function checked.  DISCHARGE CONDITIONS:   Stable  CONSULTS OBTAINED:  Treatment Team:  Iran Ouch, MD  DRUG ALLERGIES:   Allergies  Allergen Reactions  . Pregabalin Shortness  Of Breath, Swelling, Palpitations and Other (See Comments)    Other reaction(s): Tachycardia (finding)  . Codeine Nausea And Vomiting    DISCHARGE MEDICATIONS:   Allergies as of 02/11/2019      Reactions   Pregabalin Shortness Of Breath, Swelling, Palpitations, Other (See Comments)   Other reaction(s): Tachycardia (finding)   Codeine Nausea And Vomiting      Medication List    TAKE these medications   aspirin 81 MG chewable  tablet Chew 1 tablet (81 mg total) by mouth daily.   atorvastatin 40 MG tablet Commonly known as:  LIPITOR TAKE 1 TABLET BY MOUTH ONCE DAILY   buPROPion 150 MG 24 hr tablet Commonly known as:  WELLBUTRIN XL TAKE 1 TABLET BY MOUTH ONCE A DAY   carvedilol 6.25 MG tablet Commonly known as:  COREG Take 1 tablet (6.25 mg total) by mouth 2 (two) times daily.   clopidogrel 75 MG tablet Commonly known as:  PLAVIX TAKE 1 TABLET BY MOUTH ONCE A DAY   furosemide 20 MG tablet Commonly known as:  LASIX Take 1 tablet (20 mg) by mouth every day for 1 week, then take 1 tablet (20 mg) by mouth every other day. What changed:    how much to take  how to take this  when to take this  reasons to take this  additional instructions   lidocaine 5 % Commonly known as:  LIDODERM Place 2-3 patches onto the skin every 12 (twelve) hours as needed (pain). Remove & Discard patch within 12 hours or as directed by MD   lisinopril 5 MG tablet Commonly known as:  ZESTRIL Take 1 tablet (5 mg total) by mouth 2 (two) times daily.   morphine 15 MG 12 hr tablet Commonly known as:  MS CONTIN Take 15 mg by mouth every 8 (eight) hours.   nitroGLYCERIN 0.4 MG SL tablet Commonly known as:  NITROSTAT Place 1 tablet (0.4 mg total) under the tongue every 5 (five) minutes as needed for chest pain (CP or SOB).   potassium chloride SA 20 MEQ tablet Commonly known as:  K-DUR TAKE 1 TABLET BY MOUTH ONCE DAILY *IF YOU TAKE LASIX TAKE AN EXTRA DOSE AS DIRECTED* What changed:  See the new instructions.   spironolactone 25 MG tablet Commonly known as:  ALDACTONE Take 1/2 tablet (12.5 mg) by mouth twice daily   tiZANidine 4 MG tablet Commonly known as:  ZANAFLEX Take 4-8 mg by mouth daily.         DISCHARGE INSTRUCTIONS:   DIET:  Cardiac diet  DISCHARGE CONDITION:  Stable  ACTIVITY:  Activity as tolerated  OXYGEN:  Home Oxygen: No.   Oxygen Delivery: room air  DISCHARGE LOCATION:  home    If you experience worsening of your admission symptoms, develop shortness of breath, life threatening emergency, suicidal or homicidal thoughts you must seek medical attention immediately by calling 911 or calling your MD immediately  if symptoms less severe.  You Must read complete instructions/literature along with all the possible adverse reactions/side effects for all the Medicines you take and that have been prescribed to you. Take any new Medicines after you have completely understood and accpet all the possible adverse reactions/side effects.   Please note  You were cared for by a hospitalist during your hospital stay. If you have any questions about your discharge medications or the care you received while you were in the hospital after you are discharged, you can call the unit and asked to speak  with the hospitalist on call if the hospitalist that took care of you is not available. Once you are discharged, your primary care physician will handle any further medical issues. Please note that NO REFILLS for any discharge medications will be authorized once you are discharged, as it is imperative that you return to your primary care physician (or establish a relationship with a primary care physician if you do not have one) for your aftercare needs so that they can reassess your need for medications and monitor your lab values.     Today   No acute chest pain this morning.  She had a headache and some nausea this morning underwent CTA of the chest abdomen pelvis which shows no acute pathology.  Renal function was slightly abnormal but improved with IV fluid hydration.  Will discharge patient home today.  VITAL SIGNS:  Blood pressure 99/65, pulse 64, temperature 98 F (36.7 C), temperature source Oral, resp. rate 17, height  (1.651 m), weight 74.1 kg, SpO2 96 %.  I/O:    Intake/Output Summary (Last 24 hours) at 02/11/2019 1406 Last data filed at 02/11/2019 1130 Gross per 24 hour   Intake 120 ml  Output 700 ml  Net -580 ml    PHYSICAL EXAMINATION:  GENERAL:  53 y.o.-year-old patient lying in the bed with no acute distress.  EYES: Pupils equal, round, reactive to light and accommodation. No scleral icterus. Extraocular muscles intact.  HEENT: Head atraumatic, normocephalic. Oropharynx and nasopharynx clear.  NECK:  Supple, no jugular venous distention. No thyroid enlargement, no tenderness.  LUNGS: Normal breath sounds bilaterally, no wheezing, rales,rhonchi. No use of accessory muscles of respiration.  CARDIOVASCULAR: S1, S2 normal. No murmurs, rubs, or gallops.  ABDOMEN: Soft, non-tender, non-distended. Bowel sounds present. No organomegaly or mass.  EXTREMITIES: No pedal edema, cyanosis, or clubbing.  NEUROLOGIC: Cranial nerves II through XII are intact. No focal motor or sensory defecits b/l.  PSYCHIATRIC: The patient is alert and oriented x 3.  SKIN: No obvious rash, lesion, or ulcer.   DATA REVIEW:   CBC Recent Labs  Lab 02/10/19 0934  WBC 9.2  HGB 14.0  HCT 41.0  PLT 173    Chemistries  Recent Labs  Lab 02/10/19 0934 02/10/19 1601  02/11/19 1257  NA 140  --    < > 138  K 3.6  --    < > 4.5  CL 107  --    < > 103  CO2 25  --    < > 29  GLUCOSE 143*  --    < > 105*  BUN 14  --    < > 15  CREATININE 0.82  --    < > 1.26*  CALCIUM 8.9  --    < > 9.2  MG  --  2.0  --   --   AST 22  --   --   --   ALT 21  --   --   --   ALKPHOS 151*  --   --   --   BILITOT 0.7  --   --   --    < > = values in this interval not displayed.    Cardiac Enzymes Recent Labs  Lab 02/10/19 2345  TROPONINI <0.03    Microbiology Results  Results for orders placed or performed during the hospital encounter of 02/10/19  SARS Coronavirus 2 (CEPHEID - Performed in [facility] hospital lab), Hosp Order     Status: None  Collection Time: 02/10/19 12:18 PM  Result Value Ref Range Status   SARS Coronavirus 2 NEGATIVE NEGATIVE Final    Comment: (NOTE) If result  is NEGATIVE SARS-CoV-2 target nucleic acids are NOT DETECTED. The SARS-CoV-2 RNA is generally detectable in upper and lower  respiratory specimens during the acute phase of infection. The lowest  concentration of SARS-CoV-2 viral copies this assay can detect is 250  copies / mL. A negative result does not preclude SARS-CoV-2 infection  and should not be used as the sole basis for treatment or other  patient management decisions.  A negative result may occur with  improper specimen collection / handling, submission of specimen other  than nasopharyngeal swab, presence of viral mutation(s) within the  areas targeted by this assay, and inadequate number of viral copies  (<250 copies / mL). A negative result must be combined with clinical  observations, patient history, and epidemiological information. If result is POSITIVE SARS-CoV-2 target nucleic acids are DETECTED. The SARS-CoV-2 RNA is generally detectable in upper and lower  respiratory specimens dur ing the acute phase of infection.  Positive  results are indicative of active infection with SARS-CoV-2.  Clinical  correlation with patient history and other diagnostic information is  necessary to determine patient infection status.  Positive results do  not rule out bacterial infection or co-infection with other viruses. If result is PRESUMPTIVE POSTIVE SARS-CoV-2 nucleic acids MAY BE PRESENT.   A presumptive positive result was obtained on the submitted specimen  and confirmed on repeat testing.  While 2019 novel coronavirus  (SARS-CoV-2) nucleic acids may be present in the submitted sample  additional confirmatory testing may be necessary for epidemiological  and / or clinical management purposes  to differentiate between  SARS-CoV-2 and other Sarbecovirus currently known to infect humans.  If clinically indicated additional testing with an alternate test  methodology [phone]) is advised. The SARS-CoV-2 RNA is generally   detectable in upper and lower respiratory sp ecimens during the acute  phase of infection. The expected result is Negative. Fact Sheet for Patients:  BoilerBrush.com.cy Fact Sheet for Healthcare Providers: https://pope.com/ This test is not yet approved or cleared by the [place] FDA and has been authorized for detection and/or diagnosis of SARS-CoV-2 by FDA under an Emergency Use Authorization (EUA).  This EUA will remain in effect (meaning this test can be used) for the duration of the COVID-19 declaration under Section 564(b)(1) of the Act, 21 U.S.C. section 360bbb-3(b)(1), unless the authorization is terminated or revoked sooner. Performed at [facility], [address] [place], [place] [address]     RADIOLOGY:  Dg Chest 2 View  Result Date: 02/10/2019 CLINICAL DATA:  Right shoulder and breast pain since last night. Nausea and vomiting. EXAM: CHEST - 2 VIEW COMPARISON:  06/01/2017 radiographs. FINDINGS: The heart size and mediastinal contours are stable. The left sided cardiac pacemaker lead is unchanged. The lungs are clear. There is no pleural effusion or pneumothorax. No acute osseous findings are seen post lower cervical fusion. IMPRESSION: Stable postoperative chest.  No acute cardiopulmonary process. Electronically Signed   By: Carey Bullocks M.D.   On: 02/10/2019 14:17   Ct Angio Abd/pel W/ And/or W/o  Result Date: 02/11/2019 CLINICAL DATA:  Abdominal pain EXAM: CTA ABDOMEN AND PELVIS wITHOUT AND WITH CONTRAST TECHNIQUE: Multidetector CT imaging of the abdomen and pelvis was performed using the standard protocol during bolus administration of intravenous contrast. Multiplanar reconstructed images and MIPs were obtained and reviewed to evaluate the  vascular anatomy. CONTRAST:  100mL ISOVUE-370 IOPAMIDOL (ISOVUE-370) INJECTION 76% COMPARISON:  None. FINDINGS: VASCULAR Aorta: Nonaneurysmal and patent with mild  scattered atherosclerotic plaque. There is no evidence of dissection. Celiac: Patent.  Branch vessels patent SMA: Patent.  Branch vessels grossly patent. Renals: 2 right renal arteries and a single left renal artery are patent with mild atherosclerotic plaque. IMA: Severely diminutive and grossly patent. Inflow: There is smooth soft and some calcified plaque at the origin of the right and left common iliac arteries without significant focal narrowing. Internal and external iliac arteries are patent without aneurysm or dissection Proximal Outflow: Grossly patent. Review of the MIP images confirms the above findings. NON-VASCULAR Lower chest: Dependent atelectasis. Hepatobiliary: Within normal limits. Pancreas: Unremarkable Spleen: Unremarkable.  Splenules are noted. Adrenals/Urinary Tract: Kidneys and adrenal glands are within normal limits. Bladder is decompressed. Stomach/Bowel: Normal appendix. No obvious mass in the colon. Small bowel is decompressed. Stomach is grossly unremarkable. Lymphatic: No abnormal retroperitoneal adenopathy. Reproductive: Uterus and adnexa are within normal limits. Other: No free fluid. Musculoskeletal: No vertebral compression deformity. Moderate L5-S1 degenerative disc disease. IMPRESSION: VASCULAR Atherosclerotic changes are noted.  No acute vascular pathology. NON-VASCULAR No acute intra-abdominal pathology. Electronically Signed   By: Jolaine ClickArthur  Hoss M.D.   On: 02/11/2019 07:49   [place] Abdomen Limited Ruq  Result Date: 02/10/2019 CLINICAL DATA:  Acute right upper quadrant abdominal pain. EXAM: ULTRASOUND ABDOMEN LIMITED RIGHT UPPER QUADRANT COMPARISON:  None. FINDINGS: Gallbladder: No gallstones or wall thickening visualized. No sonographic Murphy sign noted by sonographer. Common bile duct: Diameter: Measures 7 mm distally which is mildly dilated. Liver: No focal lesion identified. Within normal limits in parenchymal echogenicity. Portal vein is patent on color Doppler imaging with  normal direction of blood flow towards the liver. IMPRESSION: Common bile duct measures 7 mm distally which is mildly dilated. Correlation with liver function tests is recommended to rule out obstruction. No other abnormality seen in the right upper quadrant of the abdomen. Electronically Signed   By: Lupita RaiderJames  Green Jr M.D.   On: 02/10/2019 16:30      Management plans discussed with the patient, family and they are in agreement.  CODE STATUS:     Code Status Orders  (From admission, onward)         Start     Ordered   02/10/19 1711  Full code  Continuous     02/10/19 1710          TOTAL TIME TAKING CARE OF THIS PATIENT: 40 minutes.    Houston SirenVivek J Sainani M.D on 02/11/2019 at 2:06 PM  Between 7am to 6pm - Pager - [phone]  After 6pm go to [email or web address] - Scientist, research (life sciences)password [email or web address]  [facility] [place] [facility]  Office  [phone]  CC: Primary care physician; Glori LuisSonnenberg, Eric G, MD

## 2019-02-11 NOTE — Progress Notes (Signed)
Pt c/o headache and increasing pain in her back and shoulder blade area. Pt having nausea with vomiting with no improvement even after PRN Zofran. MD made aware, IV dilaudid and Reglan ordered. Pt taken for for abdominal CT. On return, pt reports the PRN Dilaudid and Reglan were effective. HS medications given, pt has no concerns at this time.

## 2019-02-11 NOTE — Progress Notes (Signed)
Progress Note  Patient Name: Crystal Roberson Date of Encounter: 02/11/2019  Primary Cardiologist: Yvonne Kendall, MD   Subjective   Patient reported improved pain today. She feels minimal shoulder pain that improves when lying on her left side. Overnight, she had nausea and multiple episodes of clear emesis.   Labs today show slight worsening of renal function with patient reporting she is still able to drink fluids, despite nausea. Also of note, patient reported hypotensive episodes were captured on overnight and AM vitals with SBP into the 60s.   Inpatient Medications    Scheduled Meds: . aspirin  81 mg Oral Daily  . atorvastatin  40 mg Oral Daily  . buPROPion  150 mg Oral Daily  . carvedilol  6.25 mg Oral BID WC  . clopidogrel  75 mg Oral Daily  . enoxaparin (LOVENOX) injection  40 mg Subcutaneous Q24H  . morphine  15 mg Oral Q8H  . potassium chloride SA  20 mEq Oral Daily  . potassium chloride  20 mEq Oral BID  . tiZANidine  4-8 mg Oral Daily   Continuous Infusions:  PRN Meds: acetaminophen **OR** acetaminophen, HYDROmorphone (DILAUDID) injection, lidocaine, metoCLOPramide (REGLAN) injection, nitroGLYCERIN, ondansetron **OR** ondansetron (ZOFRAN) IV, prochlorperazine   Vital Signs    Vitals:   02/11/19 0414 02/11/19 0420 02/11/19 0422 02/11/19 0829  BP: (!) 63/40 96/61  99/65  Pulse: 62 75  64  Resp: 20   17  Temp: 98 F (36.7 C)   98 F (36.7 C)  TempSrc: Oral   Oral  SpO2: 94%   96%  Weight:   74.1 kg   Height:        Intake/Output Summary (Last 24 hours) at 02/11/2019 0846 Last data filed at 02/11/2019 4782 Gross per 24 hour  Intake -  Output 500 ml  Net -500 ml   Filed Weights   02/10/19 0927 02/11/19 0422  Weight: 79.4 kg 74.1 kg    Telemetry    SR - Personally Reviewed  ECG    No new tracings - Personally Reviewed  Physical Exam   GEN: No acute distress.   Neck: No JVD Cardiac: RRR, no murmurs, rubs, or gallops.  Respiratory: Clear to  auscultation bilaterally. GI: Soft, nontender, non-distended  MS: Minimal/trace lower extremity edema; No deformity.  No KUB tenderness  Neuro:  Nonfocal  Psych: Normal affect   Labs    Chemistry Recent Labs  Lab 02/10/19 0934 02/11/19 0449  NA 140 139  K 3.6 4.1  CL 107 108  CO2 25 23  GLUCOSE 143* 131*  BUN 14 14  CREATININE 0.82 1.55*  CALCIUM 8.9 9.1  PROT 7.1  --   ALBUMIN 3.9  --   AST 22  --   ALT 21  --   ALKPHOS 151*  --   BILITOT 0.7  --   GFRNONAA >60 37*  GFRAA >60 43*  ANIONGAP 8 8     Hematology Recent Labs  Lab 02/10/19 0934  WBC 9.2  RBC 4.74  HGB 14.0  HCT 41.0  MCV 86.5  MCH 29.5  MCHC 34.1  RDW 11.5  PLT 173    Cardiac Enzymes Recent Labs  Lab 02/10/19 0934 02/10/19 1601 02/10/19 1914 02/10/19 2345  TROPONINI <0.03 <0.03 <0.03 <0.03   No results for input(s): TROPIPOC in the last 168 hours.   BNPNo results for input(s): BNP, PROBNP in the last 168 hours.   DDimer No results for input(s): DDIMER in the last 168  hours.   Radiology    Dg Chest 2 View  Result Date: 02/10/2019 CLINICAL DATA:  Right shoulder and breast pain since last night. Nausea and vomiting. EXAM: CHEST - 2 VIEW COMPARISON:  06/01/2017 radiographs. FINDINGS: The heart size and mediastinal contours are stable. The left sided cardiac pacemaker lead is unchanged. The lungs are clear. There is no pleural effusion or pneumothorax. No acute osseous findings are seen post lower cervical fusion. IMPRESSION: Stable postoperative chest.  No acute cardiopulmonary process. Electronically Signed   By: Carey Bullocks M.D.   On: 02/10/2019 14:17   Ct Angio Abd/pel W/ And/or W/o  Result Date: 02/11/2019 CLINICAL DATA:  Abdominal pain EXAM: CTA ABDOMEN AND PELVIS wITHOUT AND WITH CONTRAST TECHNIQUE: Multidetector CT imaging of the abdomen and pelvis was performed using the standard protocol during bolus administration of intravenous contrast. Multiplanar reconstructed images and  MIPs were obtained and reviewed to evaluate the vascular anatomy. CONTRAST:  ISOVUE-370 IOPAMIDOL (ISOVUE-370) INJECTION 76% COMPARISON:  None. FINDINGS: VASCULAR Aorta: Nonaneurysmal and patent with mild scattered atherosclerotic plaque. There is no evidence of dissection. Celiac: Patent.  Branch vessels patent SMA: Patent.  Branch vessels grossly patent. Renals: 2 right renal arteries and a single left renal artery are patent with mild atherosclerotic plaque. IMA: Severely diminutive and grossly patent. Inflow: There is smooth soft and some calcified plaque at the origin of the right and left common iliac arteries without significant focal narrowing. Internal and external iliac arteries are patent without aneurysm or dissection Proximal Outflow: Grossly patent. Review of the MIP images confirms the above findings. NON-VASCULAR Lower chest: Dependent atelectasis. Hepatobiliary: Within normal limits. Pancreas: Unremarkable Spleen: Unremarkable.  Splenules are noted. Adrenals/Urinary Tract: Kidneys and adrenal glands are within normal limits. Bladder is decompressed. Stomach/Bowel: Normal appendix. No obvious mass in the colon. Small bowel is decompressed. Stomach is grossly unremarkable. Lymphatic: No abnormal retroperitoneal adenopathy. Reproductive: Uterus and adnexa are within normal limits. Other: No free fluid. Musculoskeletal: No vertebral compression deformity. Moderate L5-S1 degenerative disc disease. IMPRESSION: VASCULAR Atherosclerotic changes are noted.  No acute vascular pathology. NON-VASCULAR No acute intra-abdominal pathology. Electronically Signed   By: Jolaine Click M.D.   On: 02/11/2019 07:49   US Abdomen Limited Ruq  Result Date: 02/10/2019 CLINICAL DATA:  Acute right upper quadrant abdominal pain. EXAM: ULTRASOUND ABDOMEN LIMITED RIGHT UPPER QUADRANT COMPARISON:  None. FINDINGS: Gallbladder: No gallstones or wall thickening visualized. No sonographic Murphy sign noted by sonographer.  Common bile duct: Diameter: Measures 7 mm distally which is mildly dilated. Liver: No focal lesion identified. Within normal limits in parenchymal echogenicity. Portal vein is patent on color Doppler imaging with normal direction of blood flow towards the liver. IMPRESSION: Common bile duct measures 7 mm distally which is mildly dilated. Correlation with liver function tests is recommended to rule out obstruction. No other abnormality seen in the right upper quadrant of the abdomen. Electronically Signed   By: Lupita Raider M.D.   On: 02/10/2019 16:30    Cardiac Studies   Pending updated echo  Cath/PCI:  LHC/RHC (04/26/17): LMCA normal. LAD with ostial vasospasm improved to 20% with intracoronary nitroglycerin. Patent stent in mid LAD. Distal LAD shows 50% stenosis, unchanged. There are 20% ostial and 30% mid LCx lesions. Sequential 40% and 20% mid and distal RCA stenoses present. LVEDP 15-18 mmHg. LVEF 30-35% with anterior and apical akinesis. RA 6, RV 32/5, PA 32/12 (19), PCWP 17. AO sat 93%. PA sat 64%. Fick CO/CI 4.8/2.8.  LHC/PCI (09/23/16):  Anterior STEMI with 99% mid LAD stenosis with TIMI 2 flow. Mild to moderate, nonobstructive CAD involving the ostial and distal LAD, ostial and mid LCx, and mid/distal RCA. Severely reduced LV contraction (EF 25-35%) with anterior, apical, and apical inferior akinesis. Moderately elevated left ventricular filling pressure (LVEDP 27 mmHg).  EP Procedures and Devices:  ICD placement (05/31/17,Dr. Camnitz): St. Jude single-chamber deveice  Event monitor (01/2017): Final report pending preliminary review today demonstrates normal sinus rhythm with no arrhythmias during triggered events.  Non-Invasive Evaluation(s):  Limited TTE (01/23/17): Dilated left ventricle with normal wall thickness. LVEF 35-40% with apical anterior, apical septal, and apical inferior hypokinesis. Mild to moderate MR. Mild left atrial enlargement. Normal RV size and function.   Transthoracic echocardiogram (11/06/16): LVEF 30-35% with severe hypokinesis of mid/apical septal, anterior, and lateral walls, as well as the apex. Moderate to severe MR. Mild left atrial enlargment. Moderate pulmonary hypertension.  Transthoracic echocardiogram (09/13/16): Normal LV size and contraction (EF 60-65%). Normal diastolic function. Trivial mitral and tricuspid regurgitation. Normal RV size and function.  Patient Profile     53 y.o. female with hx of h/o CAD s/p anterior STEMI in 08/2016, chronic systolic heart failure d/t ICM s/p ICD 05/2016, HTN with suspicion for hyperaldosteronism, history of tobacco abuse, migraine headaches, degenerative disc disease with chronic back pain, and depression who is being seen today for the evaluation of atypical chest pain.  Assessment & Plan    Atypical Chest Pain, h/o Coronary artery disease - No current chest pain with minimal shoulder pain. Atypical CP at presentation with : R sided shoulder, R flank, & under R breast. Different from previous anterior STEMI in 2017 with PCI. R/LHC 04/2017: patent LAD stent, no significant CAD.  - Low suspicion for cardiac etiology given atypical story, no acute EKG changes, no troponin elevation. Differential wide and could include chronic back issues, as well as GI etiology. - Pending updated echo to r/o any structural changes.  - No plan for further ischemic workup or invasive procedures at this time  - Continue medical management with DAPT, Coreg 6.25mg  BID, SL nitro PRN for chest pain. ACE and spironolactone held in the setting of hypotension and renal function with Cr 0.82  1.55 with BUN 14  14.  - Continue aggressive risk factor modification with statin therapy. Follow-up as scheduled in the office  Chronic systolic heart failure secondary to ischemic cardiomyopathy - Appears relatively euvolemic on exam, minimal LEE in setting of holding lasix due to bump in Cr as below.   - Low EF therefore avoid IVF. -  Continue medical management with Coreg. Continue holding Lasix, ACE, and spironolactone given renal function / hypotension with plan for restart once stable renal function.  - Aggressive risk factor modification. No plan for escalation of guideline directed therapy 2/2 hypotension / AKI.   AKI - Bump in creatinine overnight as above.  - Etiology of elevated Cr unclear at this time Patient reports adequate fluid intake, denies dysuria or hematuria. Could consider prerenal etiology in the setting of hypotension.  - Continue to hold ACE/spiro/lasix for now with restart once renal function returns to baseline.  - Avoid IVF given low EF as above. - Caution with nephrotoxins / contrast procedure, as patient received CTA yesterday. Recheck BMET in AM.   Hypokalemia, resolved - K 4.1 with goal 4.0. Mg at goal 2.0.  HLD - LDL 09/2018 only slightly above goal at 72.  - Continue statin therapy with LDL goal <70. Could consider repeat  outpatient lipids. If not at goal when recheck lipids, consider escalating stating therapy from atorvastatin 40mg  daily to 80mg  daily as an outpatient.  HTN - Wide fluctuations in BP.  - Continue holding ACE and spironolactone at this time given SBP in 60s and bumped renal function.  For questions or updates, please contact [facility] Please consult [email or web address] for contact info under     Signed, Lennon AlstromJacquelyn D Dayron Odland, PA-C  02/11/2019, 8:46 AM

## 2019-02-11 NOTE — Progress Notes (Signed)
*  PRELIMINARY RESULTS* Echocardiogram 2D Echocardiogram has been performed.  Crystal Roberson 02/11/2019, 11:07 AM

## 2019-02-11 NOTE — Telephone Encounter (Signed)
Copied from CRM [id number]. Topic: Appointment Scheduling - Scheduling Inquiry for Clinic >> Feb 11, 2019  2:10 PM Gwenlyn Fudge A wrote: Reason for CRM: Attempted to contact office 3x. K, from [facility] called trying to schedule a hospital f/u for pt. Please advise.

## 2019-02-11 NOTE — Care Management (Signed)
No consult. No discharge needs identified by care team. Not a moderate - high risk for readmission.  CM was going to assess due to having no payor listed but discharge prior to being seen. Did not discharge home on any new meds.

## 2019-02-12 LAB — HIV ANTIBODY (ROUTINE TESTING W REFLEX): HIV Screen 4th Generation wRfx: NONREACTIVE

## 2019-02-12 NOTE — Telephone Encounter (Signed)
Patient DC 02/11/19 called patient to schedule HFU and TCM call no answer left message to call office.

## 2019-02-13 NOTE — Telephone Encounter (Signed)
Transition Care Management Follow-up Telephone Call  How have you been since you were released from the hospital? Patient says she is doing ok just still has some pain in the area RUQ abdominal pain. But feeling better.   Do you understand why you were in the hospital? yes   Do you understand the discharge instrcutions? yes  Items Reviewed:  Medications reviewed: yes  Allergies reviewed: yes  Dietary changes reviewed: yes  Referrals reviewed: yes   Functional Questionnaire:   Activities of Daily Living (ADLs):   She states they are independent in the following: ambulation, bathing and hygiene, feeding, continence, grooming, toileting and dressing States they require assistance with the following: No assistance needed at this time.   Any transportation issues/concerns?: no   Any patient concerns? no   Confirmed importance and date/time of follow-up visits scheduled: yes   Confirmed with patient if condition begins to worsen call PCP or go to the ER.  Patient was given the Call-a-Nurse line [phone]: yes

## 2019-02-18 ENCOUNTER — Ambulatory Visit: Payer: Self-pay | Admitting: Family Medicine

## 2019-02-18 DIAGNOSIS — I152 Hypertension secondary to endocrine disorders: Secondary | ICD-10-CM

## 2019-02-18 NOTE — Telephone Encounter (Signed)
Noted. Discussed previously with RN and advised that patient should be seen at urgent care. Please follow-up with her on Wednesday to see if she can find a way to check her BP and reinforce that she needs to be seen for this issue. Thanks.

## 2019-02-18 NOTE — Telephone Encounter (Signed)
  Pt. Reports her blood pressure was recently changed due to low BP when she was in the hospital. BP last night 194/112, but the her machine broke after that reading. No headache , dizziness. Has pain in her right shoulder and into right breast "that I have all the time." Pt. States she feels like she needs to go back on her regular doses. Please advise pt. Clydie Braun reports no openings with Dr. Birdie Sons. Answer Assessment - Initial Assessment Questions 1. BLOOD PRESSURE: "What is the blood pressure?" "Did you take at least two measurements 5 minutes apart?"     194/112 2. ONSET: "When did you take your blood pressure?"     Yesterday 3. HOW: "How did you obtain the blood pressure?" (e.g., visiting nurse, automatic home BP monitor)     Home monitor - broken today 4. HISTORY: "Do you have a history of high blood pressure?"     Yes 5. MEDICATIONS: "Are you taking any medications for blood pressure?" "Have you missed any doses recently?"     No missed doses 6. OTHER SYMPTOMS: "Do you have any symptoms?" (e.g., headache, chest pain, blurred vision, difficulty breathing, weakness)     No 7. PREGNANCY: "Is there any chance you are pregnant?" "When was your last menstrual period?"     No  Protocols used: HIGH BLOOD PRESSURE-A-AH

## 2019-02-18 NOTE — Telephone Encounter (Signed)
Patient says today she cannot check BP, still having pain in right breast and shoulder blade pain radiating from the back to the front of breast rated at 5 . Currently taking Zanaflex and morphine ,  Patient says he BP yesterday morning was 194/112 and she was wondering should go back to her regular dose of BP medications they were reduced in the hospital due to low BP readings.

## 2019-02-18 NOTE — Telephone Encounter (Signed)
She has not been seen in the office in over one year. She needs to be seen for follow-up from her hospitalization. She likely will need to have her BP medications altered. If she is having pain and elevated BP I would suggest an in person evaluation in the office or at urgent care.

## 2019-02-18 NOTE — Telephone Encounter (Signed)
Patient said she is not going to UC she will just start back the lisinopril and wait until she can come into the office. Advised patient she could be at risk for stroke if she does not get seen patient says she is to out of it to go any where.

## 2019-02-19 ENCOUNTER — Telehealth: Payer: Self-pay | Admitting: Physician Assistant

## 2019-02-19 NOTE — Telephone Encounter (Signed)
Patient stated she attained new BP cuff  Last night BP this morning 158/98 does not remember pulse rate, patient taking lisinopril 5 mg , Spironolactone 25 mg  Take 1 tablet (25 mg total) by mouth daily.  Patient taking differently: Take 12.5-25 mg by mouth daily.   Coreg 6.25 mg ,  She will keep appointment 02/24/19

## 2019-02-20 NOTE — Telephone Encounter (Signed)
Noted. She really does need to have lab work completed given that she has gone back on her medications that were stopped while she was in the hospital.  These were discontinued or decreased because she had acute kidney injury and they recommended repeat labs in 7 days.  Is there any way we can get her in for lab work prior to the appointment on June 1?

## 2019-02-21 NOTE — Telephone Encounter (Signed)
Spoke with patient and she staed she willhave some one drive her to [facility] Monday morning the earliest she can go, advised would prefer this afternoon she said she cannot go until Monday. FYI

## 2019-02-21 NOTE — Addendum Note (Signed)
Addended by: Birdie Sons, Kalan Rinn G on: 02/21/2019 01:09 PM   Modules accepted: Orders

## 2019-02-21 NOTE — Telephone Encounter (Signed)
Can we see if she can have labs at the hospital lab since her COVID19 test was negative? I've placed orders and if she is able to do this she could go today or some time this weekend. Thanks.

## 2019-02-24 ENCOUNTER — Other Ambulatory Visit
Admission: RE | Admit: 2019-02-24 | Discharge: 2019-02-24 | Disposition: A | Discharge status: Home / Self Care | Payer: Self-pay | Source: Ambulatory Visit | Attending: Family Medicine | Admitting: Family Medicine

## 2019-02-24 ENCOUNTER — Other Ambulatory Visit: Payer: Self-pay

## 2019-02-24 ENCOUNTER — Ambulatory Visit (INDEPENDENT_AMBULATORY_CARE_PROVIDER_SITE_OTHER): Payer: Self-pay | Admitting: Family Medicine

## 2019-02-24 ENCOUNTER — Encounter: Payer: Self-pay | Admitting: Family Medicine

## 2019-02-24 DIAGNOSIS — R748 Abnormal levels of other serum enzymes: Secondary | ICD-10-CM | POA: Insufficient documentation

## 2019-02-24 DIAGNOSIS — E269 Hyperaldosteronism, unspecified: Secondary | ICD-10-CM

## 2019-02-24 DIAGNOSIS — N179 Acute kidney failure, unspecified: Secondary | ICD-10-CM

## 2019-02-24 DIAGNOSIS — I10 Essential (primary) hypertension: Secondary | ICD-10-CM

## 2019-02-24 DIAGNOSIS — I152 Hypertension secondary to endocrine disorders: Secondary | ICD-10-CM | POA: Insufficient documentation

## 2019-02-24 DIAGNOSIS — G8929 Other chronic pain: Secondary | ICD-10-CM

## 2019-02-24 DIAGNOSIS — R079 Chest pain, unspecified: Secondary | ICD-10-CM

## 2019-02-24 LAB — BASIC METABOLIC PANEL
Anion gap: 8 (ref 5–15)
BUN: 15 mg/dL (ref 6–20)
CO2: 25 mmol/L (ref 22–32)
Calcium: 8.9 mg/dL (ref 8.9–10.3)
Chloride: 107 mmol/L (ref 98–111)
Creatinine, Ser: 0.9 mg/dL (ref 0.44–1.00)
GFR calc Af Amer: >60 mL/min (ref >60)
GFR calc non Af Amer: >60 mL/min (ref >60)
Glucose, Bld: 128 mg/dL — ABNORMAL HIGH (ref 70–99)
Potassium: 4.1 mmol/L (ref 3.5–5.1)
Sodium: 140 mmol/L (ref 135–145)

## 2019-02-24 LAB — HEPATIC FUNCTION PANEL
ALT: 17 U/L (ref 0–44)
AST: 20 U/L (ref 15–41)
Albumin: 4.1 g/dL (ref 3.5–5.0)
Alkaline Phosphatase: 129 U/L — ABNORMAL HIGH (ref 38–126)
Bilirubin, Direct: 0.1 mg/dL (ref 0.0–0.2)
Indirect Bilirubin: 0.7 mg/dL (ref 0.3–0.9)
Total Bilirubin: 0.8 mg/dL (ref 0.3–1.2)
Total Protein: 7.1 g/dL (ref 6.5–8.1)

## 2019-02-24 NOTE — Assessment & Plan Note (Signed)
Undetermined cause.  Suspect likely musculoskeletal versus a GI cause.  There is the potential that she could have passed a gallstone leading to her ultrasound findings and pain.  We will follow-up on her alkaline phosphatase and GGT.  She will continue to see her pain specialist for possible MSK pain component.  She will keep follow-up with cardiology as well.  Given return precautions.

## 2019-02-24 NOTE — Assessment & Plan Note (Signed)
We will recheck lab work.  This is likely in the setting of her low blood pressure and medication use.

## 2019-02-24 NOTE — Assessment & Plan Note (Signed)
She will continue to follow with her chronic pain specialist.

## 2019-02-24 NOTE — Assessment & Plan Note (Signed)
We will recheck this and check a GGT.  Consider referral to GI versus having endocrinology follow-up on further evaluation of this depending on results of GGT.

## 2019-02-24 NOTE — Assessment & Plan Note (Signed)
Patient reports she has not seen endocrinology.  I discussed the importance of seeing an endocrinologist with hyperaldosteronism so they can determine if medical treatment or surgery is appropriate.  Referral has been placed.

## 2019-02-24 NOTE — Assessment & Plan Note (Signed)
Uncontrolled.  I discussed the importance of her having her lab work to check her renal function and electrolytes.  I discussed that once this was completed we could determine whether or not she can go back on her Aldactone.  She notes she will have her labs completed today or tomorrow.

## 2019-02-24 NOTE — Progress Notes (Signed)
Virtual Visit via telephone Note  This visit type was conducted due to national recommendations for restrictions regarding the COVID-19 pandemic (e.g. social distancing).  This format is felt to be most appropriate for this patient at this time.  All issues noted in this document were discussed and addressed.  No physical exam was performed (except for noted visual exam findings with Video Visits).   I connected with Crystal Roberson today at  9:30 AM EDT by telephone and verified that I am speaking with the correct person using two identifiers. Location patient: home Location provider: work Persons participating in the virtual visit: patient, provider  I discussed the limitations, risks, security and privacy concerns of performing an evaluation and management service by telephone and the availability of in person appointments. I also discussed with the patient that there may be a patient responsible charge related to this service. The patient expressed understanding and agreed to proceed.  Interactive audio and video telecommunications were attempted between this provider and patient, however failed, due to patient having technical difficulties OR patient did not have access to video capability.  We continued and completed visit with audio only.   Reason for visit: Hospital follow-up.  Chest pain, hypertension, chronic pain, elevated alkaline phosphatase  HPI: Patient was hospitalized from 02/10/2019-02/11/2019.  This was for chest pain.  She notes she developed sharp right sided posterior shoulder discomfort radiating into her right chest.  She was evaluated in the hospital and underwent CT angiogram of the abdomen and pelvis as well as chest x-ray and ultrasound abdomen right upper quadrant.  There is no acute pathology noted on the CT.  Right upper quadrant ultrasound did reveal a mildly dilated common bile duct with grossly stable liver function tests with mild chronic elevation of alkaline  phosphatase.  Chest x-ray with no acute changes.  EKG with no acute changes.  She was evaluated by cardiology and it was felt to be unlikely to be related to a cardiac cause.  Possible GI cause or musculoskeletal cause per cardiology.  Her blood pressure did drop during her hospitalization and she had AKI and thus her spironolactone and lisinopril were held.  She was given pain medication and her chest pain did improve though did not resolve.  She does continue to have some discomfort though it is much improved from when she went to the hospital.  She did have some loose stools with nausea and vomiting as well.  Since being home her blood pressure did increase.  She resumed her lisinopril on her own.  Her blood pressure today was 160/98.  She was supposed to go have lab work last week though she did not have a ride and she will have it done today or tomorrow.  She is on carvedilol and lisinopril.  She has not resumed her Aldactone.  She does have a follow-up with cardiology next week.  She has chronic stable dyspnea that is unchanged.  No edema.  She does have chronic back pain which she follows with pain management for.  She takes morphine and Zanaflex.  She has been using Lidoderm patches as well.  No numbness, weakness, or incontinence.  She has had no abdominal pain.  She reports she has never seen endocrinology for her hyperaldosteronism.   ROS: See pertinent positives and negatives per HPI.  Past Medical History:  Diagnosis Date  . Allergy   . CAD (coronary artery disease)    a. 08/2016 Ant STEMI/PCI: LAD 64m (2.25x20 Promus Premier DES);  b. 04/2017 Cath: LM nl, LAD 20ost, 70ost->vasospasm, Patent mid stent, 50d, D1/2/3 small, LCX 20ost, 5m, OM1/2/3 small, RCA 60m, 20d, EF 30-35%, mild to mod MR.  . Chicken pox   . Chronic back pain   . Chronic fatigue   . DDD (degenerative disc disease), lumbar   . Depression   . HFrEF (heart failure with reduced ejection fraction) (HCC)    a. 01/2017 Echo: Ef  35-40%, septal apical, distal inf, anterior HK. Mod dil LV. Mild to mod MR. mildly dil LA; b. 04/2017 LV Gram: EF 30-35%.  . Hyperlipidemia LDL goal <70   . Hypertension   . Ischemic cardiomyopathy    a. 01/2017 Echo: EF 35-40%; b. 04/2017 LV Gram: EF 30-35%; c. 05/2017 s/p SJM 1411-36Q Ellipse VR RV only AICD.  . Migraines   . Syncope 09/12/2016    Past Surgical History:  Procedure Laterality Date  . CARDIAC CATHETERIZATION N/A 09/23/2016   Procedure: Left Heart Cath and Coronary Angiography;  Surgeon: Yvonne Kendall, MD;  Location: [facility] INVASIVE CV LAB;  Service: Cardiovascular;  Laterality: N/A;  . CARDIAC CATHETERIZATION N/A 09/23/2016   Procedure: Coronary Stent Intervention;  Surgeon: Yvonne Kendall, MD;  Location: [facility] INVASIVE CV LAB;  Service: Cardiovascular;  Laterality: N/A;  . CERVICAL DISC SURGERY    . ICD IMPLANT N/A 05/31/2017   Procedure: ICD Implant;  Surgeon: Regan Lemming, MD;  Location: [facility] INVASIVE CV LAB;  Service: Cardiovascular;  Laterality: N/A;  . RIGHT/LEFT HEART CATH AND CORONARY ANGIOGRAPHY N/A 04/26/2017   Procedure: Right/Left Heart Cath and Coronary Angiography;  Surgeon: Yvonne Kendall, MD;  Location: [facility] INVASIVE CV LAB;  Service: Cardiovascular;  Laterality: N/A;  . TUBAL LIGATION    . ULTRASOUND GUIDANCE FOR VASCULAR ACCESS  04/26/2017   Procedure: Ultrasound Guidance For Vascular Access;  Surgeon: Yvonne Kendall, MD;  Location: [facility] INVASIVE CV LAB;  Service: Cardiovascular;;    Family History  Problem Relation Age of Onset  . Hypertension Mother   . Arthritis Mother   . Hyperlipidemia Mother   . Mental illness Mother   . Hypertension Maternal Grandmother   . Heart disease Maternal Grandmother     SOCIAL HX: Former smoker.   Current Outpatient Medications:  .  aspirin 81 MG chewable tablet, Chew 1 tablet (81 mg total) by mouth daily., Disp: 30 tablet, Rfl: 11 .  atorvastatin (LIPITOR) 40 MG tablet, TAKE 1 TABLET BY MOUTH ONCE DAILY (Patient  taking differently: Take 40 mg by mouth daily. ), Disp: 90 tablet, Rfl: 3 .  buPROPion (WELLBUTRIN XL) 150 MG 24 hr tablet, TAKE 1 TABLET BY MOUTH ONCE A DAY (Patient taking differently: Take 150 mg by mouth daily. ), Disp: 90 tablet, Rfl: 0 .  carvedilol (COREG) 6.25 MG tablet, Take 1 tablet (6.25 mg total) by mouth 2 (two) times daily., Disp: 60 tablet, Rfl: 0 .  clopidogrel (PLAVIX) 75 MG tablet, TAKE 1 TABLET BY MOUTH ONCE A DAY (Patient taking differently: Take 75 mg by mouth daily. ), Disp: 30 tablet, Rfl: 6 .  furosemide (LASIX) 20 MG tablet, Take 1 tablet (20 mg) by mouth every day for 1 week, then take 1 tablet (20 mg) by mouth every other day. (Patient taking differently: Take 20 mg by mouth daily as needed for fluid. ), Disp: 30 tablet, Rfl: 3 .  lidocaine (LIDODERM) 5 %, Place 2-3 patches onto the skin every 12 (twelve) hours as needed (pain). Remove & Discard patch within 12 hours or as directed by MD ,  Disp: , Rfl:  .  lisinopril (ZESTRIL) 5 MG tablet, Take 1 tablet (5 mg total) by mouth daily., Disp: 180 tablet, Rfl: 3 .  morphine (MS CONTIN) 15 MG 12 hr tablet, Take 15 mg by mouth every 8 (eight) hours. , Disp: , Rfl:  .  nitroGLYCERIN (NITROSTAT) 0.4 MG SL tablet, Place 1 tablet (0.4 mg total) under the tongue every 5 (five) minutes as needed for chest pain (CP or SOB)., Disp: 25 tablet, Rfl: 1 .  potassium chloride SA (K-DUR,KLOR-CON) 20 MEQ tablet, TAKE 1 TABLET BY MOUTH ONCE DAILY *IF YOU TAKE LASIX TAKE AN EXTRA DOSE AS DIRECTED* (Patient taking differently: Take 20 mEq by mouth daily. (may take an extra dose, when taking furosemide)), Disp: 30 tablet, Rfl: 11 .  tiZANidine (ZANAFLEX) 4 MG tablet, Take 4-8 mg by mouth daily. , Disp: , Rfl:  .  lisinopril (PRINIVIL,ZESTRIL) 5 MG tablet, Take 1 tablet (5 mg total) by mouth 2 (two) times daily., Disp: 180 tablet, Rfl: 3  EXAM: No physical exam was completed given that this was a telehealth telephone visit.  ASSESSMENT AND PLAN:   Discussed the following assessment and plan:  Hyperaldosteronism (HCC) - Plan: Ambulatory referral to Endocrinology  Elevated alkaline phosphatase level - Plan: Gamma GT, Hepatic function panel, CANCELED: Gamma GT, CANCELED: Hepatic function panel  Essential hypertension  AKI (acute kidney injury) (HCC)  Other chronic pain  Chest pain, unspecified type  Essential hypertension Uncontrolled.  I discussed the importance of her having her lab work to check her renal function and electrolytes.  I discussed that once this was completed we could determine whether or not she can go back on her Aldactone.  She notes she will have her labs completed today or tomorrow.  Hyperaldosteronism [facility](HCC) Patient reports she has not seen endocrinology.  I discussed the importance of seeing an endocrinologist with hyperaldosteronism so they can determine if medical treatment or surgery is appropriate.  Referral has been placed.  AKI (acute kidney injury) (HCC) We will recheck lab work.  This is likely in the setting of her low blood pressure and medication use.  Chronic pain She will continue to follow with her chronic pain specialist.  Elevated alkaline phosphatase level We will recheck this and check a GGT.  Consider referral to GI versus having endocrinology follow-up on further evaluation of this depending on results of GGT.  Chest pain Undetermined cause.  Suspect likely musculoskeletal versus a GI cause.  There is the potential that she could have passed a gallstone leading to her ultrasound findings and pain.  We will follow-up on her alkaline phosphatase and GGT.  She will continue to see her pain specialist for possible MSK pain component.  She will keep follow-up with cardiology as well.  Given return precautions.  Front office staff will contact the patient to schedule follow-up in 1 to 2 months.   I discussed the assessment and treatment plan with the patient. The patient was provided an  opportunity to ask questions and all were answered. The patient agreed with the plan and demonstrated an understanding of the instructions.   The patient was advised to call back or seek an in-person evaluation if the symptoms worsen or if the condition fails to improve as anticipated.  I provided 27 minutes of non-face-to-face time during this encounter.   Marikay AlarEric Solae Norling, MD

## 2019-02-25 ENCOUNTER — Other Ambulatory Visit: Payer: Self-pay | Admitting: Family Medicine

## 2019-02-25 DIAGNOSIS — I152 Hypertension secondary to endocrine disorders: Secondary | ICD-10-CM

## 2019-02-25 DIAGNOSIS — R748 Abnormal levels of other serum enzymes: Secondary | ICD-10-CM

## 2019-02-27 ENCOUNTER — Other Ambulatory Visit: Payer: Self-pay | Admitting: *Deleted

## 2019-02-27 ENCOUNTER — Telehealth: Payer: Self-pay | Admitting: *Deleted

## 2019-02-27 NOTE — Telephone Encounter (Signed)
Added spironolactone back to med list per pcp.

## 2019-03-04 ENCOUNTER — Other Ambulatory Visit: Payer: Self-pay

## 2019-03-06 ENCOUNTER — Telehealth (INDEPENDENT_AMBULATORY_CARE_PROVIDER_SITE_OTHER): Payer: Self-pay | Admitting: Internal Medicine

## 2019-03-06 ENCOUNTER — Other Ambulatory Visit: Payer: Self-pay

## 2019-03-06 ENCOUNTER — Telehealth: Payer: Self-pay | Admitting: *Deleted

## 2019-03-06 ENCOUNTER — Encounter: Payer: Self-pay | Admitting: Internal Medicine

## 2019-03-06 VITALS — BP 160/88 | HR 68 | Ht 65.0 in | Wt 164.0 lb

## 2019-03-06 DIAGNOSIS — E785 Hyperlipidemia, unspecified: Secondary | ICD-10-CM

## 2019-03-06 DIAGNOSIS — Z79899 Other long term (current) drug therapy: Secondary | ICD-10-CM

## 2019-03-06 DIAGNOSIS — I5022 Chronic systolic (congestive) heart failure: Secondary | ICD-10-CM

## 2019-03-06 DIAGNOSIS — R0989 Other specified symptoms and signs involving the circulatory and respiratory systems: Secondary | ICD-10-CM

## 2019-03-06 DIAGNOSIS — I255 Ischemic cardiomyopathy: Secondary | ICD-10-CM

## 2019-03-06 DIAGNOSIS — I2510 Atherosclerotic heart disease of native coronary artery without angina pectoris: Secondary | ICD-10-CM

## 2019-03-06 MED ORDER — LISINOPRIL 10 MG PO TABS: 10.0000 mg | ORAL_TABLET | Freq: Two times a day (BID) | ORAL | 1 refills | Status: DC

## 2019-03-06 NOTE — Progress Notes (Signed)
Virtual Visit via Telephone Note   This visit type was conducted due to national recommendations for restrictions regarding the COVID-19 Pandemic (e.g. social distancing) in an effort to limit this patient's exposure and mitigate transmission in our community.  Due to her co-morbid illnesses, this patient is at least at moderate risk for complications without adequate follow up.  This format is felt to be most appropriate for this patient at this time.  The patient did not have access to video technology/had technical difficulties with video requiring transitioning to audio format only (telephone).  All issues noted in this document were discussed and addressed.  No physical exam could be performed with this format.  Please refer to the patient's chart for her  consent to telehealth for [facility].   Date:  03/06/2019   ID:  Crystal Roberson D Ritsema, DOB [birth year], MRN [id number]  Patient Location: Home Provider Location: Home  PCP:  Glori LuisSonnenberg, Eric G, MD  Cardiologist:  Yvonne Kendallhristopher Venetia Prewitt, MD  Electrophysiologist:  None   Evaluation Performed:  Follow-Up Visit  Chief Complaint: Follow-up coronary disease and labile blood pressure  History of Present Illness:    Mekenzie D Odis Roberson is a 53 y.o. female with  history of coronary artery disease status post anterior STEMI in 08/2016,chronic systolic heart failure due toischemic cardiomyopathystatus post ICD(05/2016), hypertensionwith suspicion for hyperaldosteronism, migraine headaches, degenerative disc disease with chronic back pain, and depression.  We last spoke in April, at which time she reported a month-long period of vague chest discomfort, shortness of breath, and fatigue.  This gradually resolved on its own.  We did not make any medication changes or pursue further testing at that time.  She was [facility] to [facility] last month with back and right-sided chest pain that was not felt to be cardiac in nature.  The pain improved with metoclopramide and  hydromorphone.  Echo shows stable to improved moderate LV dysfunction (EF 40-45%).  CTA of the abdomen and pelvis did not reveal any acute abnormality.  Abdominal ultrasound showed mildly dilated common bile duct but no gallstones or other findings consistent with cholecystitis.  Lisinopril and spironolactone were held due to transient episodes of hypotension, which have been longstanding.  However, due to elevated blood pressure since returning home, Crystal Roberson had restarted lisinopril on her own when she followed up with Dr. Birdie SonsSonnenberg on 6/1.  Repeat referral to endocrinology was made, as she was never seen despite having been referred in the past.  Today, Crystal Roberson reports that she is feeling back to her baseline.  She continued to have right-sided chest and upper back pain for about a week after leaving the hospital.  This ultimately resolved with her prior doses of Zanaflex and morphine.  She has not had any further chest pain or significant dyspnea.  She continues to have some fatigue and chronic back pain.  She notes 2 episodes of transient low blood pressures with associated lightheadedness since leaving the hospital.  However, her blood pressure is actually high most of the time, with readings close to 200 mmHg systolic.  The patient does not have symptoms concerning for COVID-19 infection (fever, chills, cough, or new shortness of breath).    Past Medical History:  Diagnosis Date   Allergy    CAD (coronary artery disease)    a. 08/2016 Ant STEMI/PCI: LAD 550m (2.25x20 Promus Premier DES); b. 04/2017 Cath: LM nl, LAD 20ost, 70ost->vasospasm, Patent mid stent, 50d, D1/2/3 small, LCX 20ost, 56101m, OM1/2/3 small, RCA 1926m, 20d, EF  30-35%, mild to mod MR.   Chicken pox    Chronic back pain    Chronic fatigue    DDD (degenerative disc disease), lumbar    Depression    HFrEF (heart failure with reduced ejection fraction) (HCC)    a. 01/2017 Echo: Ef 35-40%, septal apical, distal inf,  anterior HK. Mod dil LV. Mild to mod MR. mildly dil LA; b. 04/2017 LV Gram: EF 30-35%.   Hyperlipidemia LDL goal <70    Hypertension    Ischemic cardiomyopathy    a. 01/2017 Echo: EF 35-40%; b. 04/2017 LV Gram: EF 30-35%; c. 05/2017 s/p SJM 1411-36Q Ellipse VR RV only AICD.   Migraines    Syncope 09/12/2016   Past Surgical History:  Procedure Laterality Date   CARDIAC CATHETERIZATION N/A 09/23/2016   Procedure: Left Heart Cath and Coronary Angiography;  Surgeon: Yvonne Kendallhristopher Codylee Patil, MD;  [facility];  Service: Cardiovascular;  Laterality: N/A;   CARDIAC CATHETERIZATION N/A 09/23/2016   Procedure: Coronary Stent Intervention;  Surgeon: Yvonne Kendallhristopher Varvara Legault, MD;  Location: [facility] INVASIVE CV LAB;  Service: Cardiovascular;  Laterality: N/A;   CERVICAL DISC SURGERY     ICD IMPLANT N/A 05/31/2017   Procedure: ICD Implant;  Surgeon: Regan Lemmingamnitz, Will Martin, MD;  [facility];  Service: Cardiovascular;  Laterality: N/A;   RIGHT/LEFT HEART CATH AND CORONARY ANGIOGRAPHY N/A 04/26/2017   Procedure: Right/Left Heart Cath and Coronary Angiography;  Surgeon: Yvonne KendallEnd, Zhanae Proffit, MD;  Location: [facility] INVASIVE CV LAB;  Service: Cardiovascular;  Laterality: N/A;   TUBAL LIGATION     ULTRASOUND GUIDANCE FOR VASCULAR ACCESS  04/26/2017   Procedure: Ultrasound Guidance For Vascular Access;  Surgeon: Yvonne KendallEnd, Leyani Gargus, MD;  Location: [facility] INVASIVE CV LAB;  Service: Cardiovascular;;     Current Meds  Medication Sig   aspirin 81 MG chewable tablet Chew 1 tablet (81 mg total) by mouth daily.   atorvastatin (LIPITOR) 40 MG tablet TAKE 1 TABLET BY MOUTH ONCE DAILY (Patient taking differently: Take 40 mg by mouth daily. )   buPROPion (WELLBUTRIN XL) 150 MG 24 hr tablet TAKE 1 TABLET BY MOUTH ONCE A DAY (Patient taking differently: Take 150 mg by mouth daily. )   carvedilol (COREG) 3.125 MG tablet Take 3.125 mg by mouth daily.   clopidogrel (PLAVIX) 75 MG tablet TAKE 1 TABLET BY MOUTH ONCE A DAY  (Patient taking differently: Take 75 mg by mouth daily. )   furosemide (LASIX) 20 MG tablet Take 1 tablet (20 mg) by mouth every day for 1 week, then take 1 tablet (20 mg) by mouth every other day. (Patient taking differently: Take 20 mg by mouth daily as needed for fluid. )   lidocaine (LIDODERM) 5 % Place 2-3 patches onto the skin every 12 (twelve) hours as needed (pain). Remove & Discard patch within 12 hours or as directed by MD    lisinopril (PRINIVIL,ZESTRIL) 5 MG tablet Take 1 tablet (5 mg total) by mouth 2 (two) times daily.   morphine (MS CONTIN) 15 MG 12 hr tablet Take 15 mg by mouth every 8 (eight) hours.    nitroGLYCERIN (NITROSTAT) 0.4 MG SL tablet Place 1 tablet (0.4 mg total) under the tongue every 5 (five) minutes as needed for chest pain (CP or SOB).   potassium chloride SA (K-DUR,KLOR-CON) 20 MEQ tablet TAKE 1 TABLET BY MOUTH ONCE DAILY *IF YOU TAKE LASIX TAKE AN EXTRA DOSE AS DIRECTED* (Patient taking differently: Take 20 mEq by mouth daily. (may take an extra dose,  when taking furosemide))   spironolactone (ALDACTONE) 25 MG tablet Take 12.5 mg by mouth 2 (two) times daily.   tiZANidine (ZANAFLEX) 4 MG tablet Take 4-8 mg by mouth daily.      Allergies:   Pregabalin and Codeine   Social History   Tobacco Use   Smoking status: Former Smoker    Packs/day: 0.75    Years: 30.00    Pack years: 22.50    Types: Cigarettes    Quit date: 09/23/2016    Years since quitting: 2.4   Smokeless tobacco: Never Used  Substance Use Topics   Alcohol use: No    Alcohol/week: 0.0 standard drinks   Drug use: No     Family Hx: The patient's family history includes Arthritis in her mother; Heart disease in her maternal grandmother; Hyperlipidemia in her mother; Hypertension in her maternal grandmother and mother; Mental illness in her mother.  ROS:   Please see the history of present illness.   All other systems reviewed and are negative.   Prior CV studies:   The  following studies were reviewed today:  Cath/PCI:  LHC/RHC (04/26/17): LMCA normal. LAD with ostial vasospasm improved to 20% with intracoronary nitroglycerin. Patent stent in mid LAD. Distal LAD shows 50% stenosis, unchanged. There are 20% ostial and 30% mid LCx lesions. Sequential 40% and 20% mid and distal RCA stenoses present. LVEDP 15-18 mmHg. LVEF 30-35% with anterior and apical akinesis. RA 6, RV 32/5, PA 32/12 (19), PCWP 17. AO sat 93%. PA sat 64%. Fick CO/CI 4.8/2.8.  LHC/PCI (09/23/16): Anterior STEMI with 99% mid LAD stenosis with TIMI 2 flow. Mild to moderate, nonobstructive CAD involving the ostial and distal LAD, ostial and mid LCx, and mid/distal RCA. Severely reduced LV contraction (EF 25-35%) with anterior, apical, and apical inferior akinesis. Moderately elevated left ventricular filling pressure (LVEDP 27 mmHg).  EP Procedures and Devices:  ICD placement (05/31/17,Dr. Camnitz): St. Jude single-chamber deveice  Event monitor (01/2017): Final report pending preliminary review today demonstrates normal sinus rhythm with no arrhythmias during triggered events.  Non-Invasive Evaluation(s):  Transthoracic echocardiogram (02/11/2019): Normal LV size and wall thickness.  LVEF 40-45% with mid and apical anterior, and anteroseptal akinesis.  Normal RV size and function.  No significant valvular abnormality.  Limited TTE (01/23/17): Dilated left ventricle with normal wall thickness. LVEF 35-40% with apical anterior, apical septal, and apical inferior hypokinesis. Mild to moderate MR. Mild left atrial enlargement. Normal RV size and function.  Transthoracic echocardiogram (11/06/16): LVEF 30-35% with severe hypokinesis of mid/apical septal, anterior, and lateral walls, as well as the apex. Moderate to severe MR. Mild left atrial enlargment. Moderate pulmonary hypertension.  Transthoracic echocardiogram (09/13/16): Normal LV size and contraction (EF 60-65%). Normal diastolic function. Trivial  mitral and tricuspid regurgitation. Normal RV size and function.  Labs/Other Tests and Data Reviewed:    EKG:  No ECG reviewed.  Recent Labs: 02/10/2019: Hemoglobin 14.0; Magnesium 2.0; Platelets 173 02/24/2019: ALT 17; BUN 15; Creatinine, Ser 0.90; Potassium 4.1; Sodium 140   Recent Lipid Panel Lab Results  Component Value Date/Time   CHOL 152 10/09/2018 09:35 AM   TRIG 95 10/09/2018 09:35 AM   HDL 61 10/09/2018 09:35 AM   CHOLHDL 2.5 10/09/2018 09:35 AM   CHOLHDL 2 03/30/2017 03:21 PM   LDLCALC 72 10/09/2018 09:35 AM    Wt Readings from Last 3 Encounters:  03/06/19 164 lb (74.4 kg)  02/11/19 163 lb 6.4 oz (74.1 kg)  01/13/19 167 lb (75.8 kg)  Objective:    Vital Signs:  BP (!) 160/88 (BP Location: Left Arm, Patient Position: Sitting, Cuff Size: Normal) Comment: 10:00 am   Pulse 68    Ht 5\' 5"  (1.651 m)    Wt 164 lb (74.4 kg)    BMI 27.29 kg/m    VITAL SIGNS:  reviewed  ASSESSMENT & PLAN:    Coronary artery disease: Chest pain that led to recent hospitalization has resolved.  Work-up during that time was negative for acute coronary syndrome.  Pain most likely related to musculoskeletal etiology.  We will continue current medications for secondary prevention, including indefinite dual antiplatelet therapy with aspirin and clopidogrel.  Labile hypertension: Blood pressure typically quite elevated despite being back on baseline regimen of carvedilol, lisinopril, and spironolactone.  Ms. Birch continues to have transient hypotension with associated fatigue and lightheadedness.  Endocrinology consultation is pending given elevated serum aldosterone and plasma renin activity in the past.  Recent CTA of the abdomen showed no adrenal mass.  Renal arteries were also patent with mild disease.  We discussed having her wear a 24-hour ambulatory blood pressure monitor but have agreed to defer this for now.  We have agreed to increase lisinopril to 10 mg twice daily, though if blood  pressure is low prior to her dose, she can hold or delay it.  Chronic systolic heart failure secondary to ischemic cardiomyopathy: Ms. Delancey has stable NYHA class II-III heart failure symptoms and is euvolemic by her report.  Most recent echo showed LVEF of 40 to 45% last month.  We will increase lisinopril, as above.  Continue current doses of spironolactone and carvedilol.  Hyperlipidemia: Continue atorvastatin 40 mg daily.  COVID-19 Education: The signs and symptoms of COVID-19 were discussed with the patient and how to seek care for testing (follow up with PCP or arrange E-visit).  The importance of social distancing was discussed today.  Time:   Today, I have spent 15 minutes with the patient with telehealth technology discussing the above problems.     Medication Adjustments/Labs and Tests Ordered: Current medicines are reviewed at length with the patient today.  Concerns regarding medicines are outlined above.   Tests Ordered: BMP in ~2 weeks.  Medication Changes: Increase lisinopril to 10 mg BID  Disposition:  Follow up in 3 month(s)  Signed, Nelva Bush, MD  03/06/2019 2:29 PM    [facility]

## 2019-03-06 NOTE — Telephone Encounter (Signed)
Virtual Visit Pre-Appointment Phone Call  "(Name), I am calling you today to discuss your upcoming appointment. We are currently trying to limit exposure to the virus that causes COVID-19 by seeing patients at home rather than in the office."  1. "What is the BEST phone number to call the day of the visit?" - include this in appointment notes  2. "Do you have or have access to (through a family member/friend) a smartphone with video capability that we can use for your visit?" a. If yes - list this number in appt notes as "cell" (if different from BEST phone #) and list the appointment type as a VIDEO visit in appointment notes b. If no - list the appointment type as a PHONE visit in appointment notes  3. Confirm consent - "In the setting of the current Covid19 crisis, you are scheduled for a (phone) visit with your provider on (03/06/2019) at (2:30) Just as we do with many in-office visits, in order for you to participate in this visit, we must obtain consent.  If you'd like, I can send this to your mychart (if signed up) or email for you to review.  Otherwise, I can obtain your verbal consent now.  All virtual visits are billed to your insurance company just like a normal visit would be.  By agreeing to a virtual visit, we'd like you to understand that the technology does not allow for your provider to perform an examination, and thus may limit your provider's ability to fully assess your condition. If your provider identifies any concerns that need to be evaluated in person, we will make arrangements to do so.  Finally, though the technology is pretty good, we cannot assure that it will always work on either your or our end, and in the setting of a video visit, we may have to convert it to a phone-only visit.  In either situation, we cannot ensure that we have a secure connection.  Are you willing to proceed?" YES  4. Advise patient to be prepared - "Two hours prior to your appointment, go ahead  and check your blood pressure, pulse, oxygen saturation, and your weight (if you have the equipment to check those) and write them all down. When your visit starts, your provider will ask you for this information. If you have an Apple Watch or Kardia device, please plan to have heart rate information ready on the day of your appointment. Please have a pen and paper handy nearby the day of the visit as well."  5. Give patient instructions for MyChart download to smartphone OR Doximity/Doxy.me as below if video visit (depending on what platform provider is using)  6. Inform patient they will receive a phone call 15 minutes prior to their appointment time (may be from unknown caller ID) so they should be prepared to answer    TELEPHONE CALL NOTE  Rossie D Rawe has been deemed a candidate for a follow-up tele-health visit to limit community exposure during the Covid-19 pandemic. I spoke with the patient via phone to ensure availability of phone/video source, confirm preferred email & phone number, and discuss instructions and expectations.  I reminded Novelle D Croswell to be prepared with any vital sign and/or heart rhythm information that could potentially be obtained via home monitoring, at the time of her visit. I reminded Raelin D Kauer to expect a phone call prior to her visit.  ,  C, CMA 03/06/2019 2:09 PM   INSTRUCTIONS FOR DOWNLOADING THE  MYCHART APP TO SMARTPHONE  - The patient must first make sure to have activated MyChart and know their login information - If Apple, go to [employer] and type in MyChart in the search bar and download the app. If Android, ask patient to go to [employer] and type in [place] in the search bar and download the app. The app is free but as with any other app downloads, their phone may require them to verify saved payment information or Apple/Android password.  - The patient will need to then log into the app with their MyChart username and password, and  select [facility] as their healthcare provider to link the account. When it is time for your visit, go to the MyChart app, find appointments, and click Begin Video Visit. Be sure to Select Allow for your device to access the Microphone and Camera for your visit. You will then be connected, and your provider will be with you shortly.  **If they have any issues connecting, or need assistance please contact MyChart service desk [phone])**  **If using a computer, in order to ensure the best quality for their visit they will need to use either of the following Internet Browsers: [employer], or Google Chrome**  IF USING DOXIMITY or DOXY.ME - The patient will receive a link just prior to their visit by text.     FULL LENGTH CONSENT FOR TELE-HEALTH VISIT   I hereby voluntarily request, consent and authorize [facility] and its employed or contracted physicians, physician assistants, nurse practitioners or other licensed health care professionals (the Practitioner), to provide me with telemedicine health care services (the "Services") as deemed necessary by the treating Practitioner. I acknowledge and consent to receive the Services by the Practitioner via telemedicine. I understand that the telemedicine visit will involve communicating with the Practitioner through live audiovisual communication technology and the disclosure of certain medical information by electronic transmission. I acknowledge that I have been given the opportunity to request an in-person assessment or other available alternative prior to the telemedicine visit and am voluntarily participating in the telemedicine visit.  I understand that I have the right to withhold or withdraw my consent to the use of telemedicine in the course of my care at any time, without affecting my right to future care or treatment, and that the Practitioner or I may terminate the telemedicine visit at any time. I understand that I have  the right to inspect all information obtained and/or recorded in the course of the telemedicine visit and may receive copies of available information for a reasonable fee.  I understand that some of the potential risks of receiving the Services via telemedicine include:  Marland Kitchen Delay or interruption in medical evaluation due to technological equipment failure or disruption; . Information transmitted may not be sufficient (e.g. poor resolution of images) to allow for appropriate medical decision making by the Practitioner; and/or  . In rare instances, security protocols could fail, causing a breach of personal health information.  Furthermore, I acknowledge that it is my responsibility to provide information about my medical history, conditions and care that is complete and accurate to the best of my ability. I acknowledge that Practitioner's advice, recommendations, and/or decision may be based on factors not within their control, such as incomplete or inaccurate data provided by me or distortions of diagnostic images or specimens that may result from electronic transmissions. I understand that the practice of medicine is not an exact science and that Practitioner makes  no warranties or guarantees regarding treatment outcomes. I acknowledge that I will receive a copy of this consent concurrently upon execution via email to the email address I last provided but may also request a printed copy by calling the office of [facility].    I understand that my insurance will be billed for this visit.   I have read or had this consent read to me. . I understand the contents of this consent, which adequately explains the benefits and risks of the Services being provided via telemedicine.  . I have been provided ample opportunity to ask questions regarding this consent and the Services and have had my questions answered to my satisfaction. . I give my informed consent for the services to be provided through the use  of telemedicine in my medical care  By participating in this telemedicine visit I agree to the above.

## 2019-03-06 NOTE — Patient Instructions (Signed)
Medication Instructions:  Your physician has recommended you make the following change in your medication:  1- INCREASE Lisinopril to 10 mg by mouth two times a day.   If you need a refill on your cardiac medications before your next appointment, please call your pharmacy.   Lab work: Your physician recommends that you return for lab work in: [place] (BMET). - SOMEWHERE BETWEEN June 18-25. - Please go to the [facility]. You will check in at the front desk to the right as you walk into the atrium. Valet Parking is offered if needed.   If you have labs (blood work) drawn today and your tests are completely normal, you will receive your results only by: Marland Kitchen MyChart Message (if you have MyChart) OR . A paper copy in the mail If you have any lab test that is abnormal or we need to change your treatment, we will call you to review the results.  Testing/Procedures: - None ordered.   Follow-Up: At [facility], you and your health needs are our priority.  As part of our continuing mission to provide you with exceptional heart care, we have created designated Provider Care Teams.  These Care Teams include your primary Cardiologist (physician) and Advanced Practice Providers (APPs -  Physician Assistants and Nurse Practitioners) who all work together to provide you with the care you need, when you need it. You will need a follow up appointment in 3 months IN OFFICE.  Please call our office 2 months in advance to schedule this appointment.  You may see Nelva Bush, MD or one of the following Advanced Practice Providers on your designated Care Team:   Murray Hodgkins, NP Christell Faith, PA-C . Marrianne Mood, PA-C

## 2019-03-06 NOTE — Telephone Encounter (Signed)
Patient verbalized understanding of med change and lab work as listed on her AVS from visit today.

## 2019-03-17 ENCOUNTER — Encounter: Payer: Self-pay | Admitting: *Deleted

## 2019-03-17 ENCOUNTER — Other Ambulatory Visit: Payer: Self-pay | Admitting: Family Medicine

## 2019-03-18 ENCOUNTER — Telehealth: Payer: Self-pay

## 2019-03-18 NOTE — Telephone Encounter (Signed)
Left message for patient to remind of missed remote transmission.  

## 2019-03-18 NOTE — Telephone Encounter (Signed)
Please contact the patient to see what her blood pressure has been running.  I need to know what her blood pressure is and that it is better controlled prior to refilling this medication.  Thanks.

## 2019-03-19 NOTE — Telephone Encounter (Signed)
Called and left a message for pt to check her mychart or cal the office.  Annahi Short,cma

## 2019-03-25 NOTE — Telephone Encounter (Signed)
lmtcb about her BP readings.  Nina,cma

## 2019-04-24 NOTE — Telephone Encounter (Signed)
Noted. I would like to see her BP better controlled prior to refilling the wellbutrin as that could contribute to elevated BP. I would suggest we place her on a different medication that does not affect BP. Please see if she is willing to do that. It also she has was to have labs through cardiology.  Please see if she has plans to get these completed.  Thanks.

## 2019-04-24 NOTE — Telephone Encounter (Signed)
I had called and left a message for patient a while ago, she states she had to get new phones for her house, patient states her BP has been better since starting the Lisinopril, she forgot to put the dates besides the readings:   154/70 160/84 145/60  Patient states it has not been in the 200's in quite a while, since the lisinopril.  Afra Tricarico,cma

## 2019-04-25 NOTE — Telephone Encounter (Signed)
Lmtcb.  Asiana Benninger,cma 

## 2019-05-08 ENCOUNTER — Other Ambulatory Visit: Payer: Self-pay | Admitting: Family Medicine

## 2019-05-08 MED ORDER — BUPROPION HCL ER (XL) 150 MG PO TB24: 150.0000 mg | ORAL_TABLET | Freq: Every day | ORAL | 0 refills | Status: DC

## 2019-05-08 NOTE — Telephone Encounter (Signed)
Refill sent.

## 2019-05-08 NOTE — Telephone Encounter (Signed)
Pt is requesting refill for buPROPion (WELLBUTRIN XL) 150 MG 24 hr tablet    Pharmacy:  [facility], [place] [phone] (Phone) [phone] (Fax

## 2019-05-12 NOTE — Telephone Encounter (Signed)
Please call the patient to follow-up on this.  Thanks.

## 2019-08-22 ENCOUNTER — Other Ambulatory Visit: Payer: Self-pay | Admitting: Internal Medicine

## 2019-09-23 ENCOUNTER — Ambulatory Visit: Payer: Self-pay | Attending: Internal Medicine

## 2019-09-23 DIAGNOSIS — Z20822 Contact with and (suspected) exposure to covid-19: Secondary | ICD-10-CM

## 2019-09-23 DIAGNOSIS — Z20828 Contact with and (suspected) exposure to other viral communicable diseases: Secondary | ICD-10-CM | POA: Insufficient documentation

## 2019-09-24 LAB — NOVEL CORONAVIRUS, NAA: SARS-CoV-2, NAA: NOT DETECTED

## 2019-10-22 ENCOUNTER — Other Ambulatory Visit: Payer: Self-pay | Admitting: Family Medicine

## 2019-11-24 ENCOUNTER — Telehealth: Payer: Self-pay | Admitting: Internal Medicine

## 2019-11-24 NOTE — Telephone Encounter (Signed)
3 attempts to schedule fu appt from recall list.   Deleting recall.   

## 2019-12-08 ENCOUNTER — Other Ambulatory Visit: Payer: Self-pay | Admitting: Internal Medicine

## 2019-12-22 ENCOUNTER — Other Ambulatory Visit: Payer: Self-pay | Admitting: Internal Medicine

## 2020-01-21 ENCOUNTER — Other Ambulatory Visit: Payer: Self-pay | Admitting: Internal Medicine

## 2020-01-21 NOTE — Telephone Encounter (Signed)
Patient will need an appointment for further refills, Thanks ! LS 02/2019 -- Told to follow up in 3 months.

## 2020-01-21 NOTE — Telephone Encounter (Signed)
LVM on cell to callback, home phone is not in service

## 2020-01-26 NOTE — Telephone Encounter (Signed)
Unable to reach.

## 2020-02-05 NOTE — Telephone Encounter (Signed)
Scheduled for virtual next thursday

## 2020-02-12 ENCOUNTER — Telehealth: Payer: Self-pay | Admitting: Internal Medicine

## 2020-02-12 ENCOUNTER — Other Ambulatory Visit: Payer: Self-pay

## 2020-02-12 NOTE — Progress Notes (Deleted)
{Choose 1 Note Type (Video or Telephone):[phone]}   The patient was identified using 2 identifiers.  Date:  02/12/2020   ID:  Crystal Roberson, DOB 03/07/1966, MRN [id number]  Patient Location: Home Provider Location: Home  PCP:  Glori Luis, MD  Cardiologist:  Yvonne Kendall, MD  Electrophysiologist:  None   Evaluation Performed:  Follow-Up Visit  Chief Complaint:  ***  History of Present Illness:    Crystal Roberson is a 54 y.o. female with of coronary artery disease status post anterior STEMI in 08/2016,chronic systolic heart failure due toischemic cardiomyopathystatus post ICD(05/2016), hypertensionwith suspicion for hyperaldosteronism, migraine headaches, degenerative disc disease with chronic back pain, and depression.  We last spoke in 02/2019 following a hospitalization for atypical chest pain in 01/2019.  Crystal Roberson reported feeling back to baseline with resolution of her right-sided chest and upper back pain with Zanaflex and morphine.  The patient {does/does not:200015} have symptoms concerning for COVID-19 infection (fever, chills, cough, or new shortness of breath).    Past Medical History:  Diagnosis Date  . Allergy   . CAD (coronary artery disease)    a. 08/2016 Ant STEMI/PCI: LAD 88m (2.25x20 Promus Premier DES); b. 04/2017 Cath: LM nl, LAD 20ost, 70ost->vasospasm, Patent mid stent, 50d, D1/2/3 small, LCX 20ost, 28m, OM1/2/3 small, RCA 43m, 20d, EF 30-35%, mild to mod MR.  . Chicken pox   . Chronic back pain   . Chronic fatigue   . DDD (degenerative disc disease), lumbar   . Depression   . HFrEF (heart failure with reduced ejection fraction) (HCC)    a. 01/2017 Echo: Ef 35-40%, septal apical, distal inf, anterior HK. Mod dil LV. Mild to mod MR. mildly dil LA; b. 04/2017 LV Gram: EF 30-35%.  . Hyperlipidemia LDL goal <70   . Hypertension   . Ischemic cardiomyopathy    a. 01/2017 Echo: EF 35-40%; b. 04/2017 LV Gram: EF 30-35%; c. 05/2017 s/p SJM [id number] Ellipse VR  RV only AICD.  . Migraines   . Syncope 09/12/2016   Past Surgical History:  Procedure Laterality Date  . CARDIAC CATHETERIZATION N/A 09/23/2016   Procedure: Left Heart Cath and Coronary Angiography;  Surgeon: Yvonne Kendall, MD;  [facility] INVAS[facility] CV LAB;  Service: Cardiovascular;  Laterality: N/A;  . CARDIAC CATHETERIZATION N/A 09/23/2016   Procedure: Coronary Stent Intervention;  Surgeon: Yvonne Kendall, MD;  Location: [facility] INVASIVE CV LAB;  Service: Cardiovascular;  Laterality: N/A;  . CERVICAL DISC SURGERY    . ICD IMPLANT N/A 05/31/2017   Procedure: ICD Implant;  Surgeon: Regan Lemming, MD;  Location: [facility] INVASIVE CV LAB;  Service: Cardiovascular;  Laterality: N/A;  . RIGHT/LEFT HEART CATH AND CORONARY ANGIOGRAPHY N/A 04/26/2017   Procedure: Right/Left Heart Cath and Coronary Angiography;  Surgeon: Yvonne Kendall, MD;  Location: [facility] INVASIVE CV LAB;  Service: Cardiovascular;  Laterality: N/A;  . TUBAL LIGATION    . ULTRASOUND GUIDANCE FOR VASCULAR ACCESS  04/26/2017   Procedure: Ultrasound Guidance For Vascular Access;  Surgeon: Yvonne Kendall, MD;  Location: [facility] INVASIVE CV LAB;  Service: Cardiovascular;;     No outpatient medications have been marked as taking for the 02/12/20 encounter (Appointment) with Gwenlyn Hottinger, Cristal Deer, MD.     Allergies:   Pregabalin and Codeine   Social History   Tobacco Use  . Smoking status: Former Smoker    Packs/day: 0.75    Years: 30.00    Pack years: 22.50    Types: Cigarettes    Quit date: 09/23/2016  Years since quitting: 3.3  . Smokeless tobacco: Never Used  Substance Use Topics  . Alcohol use: No    Alcohol/week: 0.0 standard drinks  . Drug use: No     Family Hx: The patient's family history includes Arthritis in her mother; Heart disease in her maternal grandmother; Hyperlipidemia in her mother; Hypertension in her maternal grandmother and mother; Mental illness in her mother.  ROS:   Please see the history of present  illness.    *** All other systems reviewed and are negative.   Prior CV studies:   The following studies were reviewed today:  Cath/PCI:  LHC/RHC (04/26/17): LMCA normal. LAD with ostial vasospasm improved to 20% with intracoronary nitroglycerin. Patent stent in mid LAD. Distal LAD shows 50% stenosis, unchanged. There are 20% ostial and 30% mid LCx lesions. Sequential 40% and 20% mid and distal RCA stenoses present. LVEDP 15-18 mmHg. LVEF 30-35% with anterior and apical akinesis. RA 6, RV 32/5, PA 32/12 (19), PCWP 17. AO sat 93%. PA sat 64%. Fick CO/CI 4.8/2.8.  LHC/PCI (09/23/16): Anterior STEMI with 99% mid LAD stenosis with TIMI 2 flow. Mild to moderate, nonobstructive CAD involving the ostial and distal LAD, ostial and mid LCx, and mid/distal RCA. Severely reduced LV contraction (EF 25-35%) with anterior, apical, and apical inferior akinesis. Moderately elevated left ventricular filling pressure (LVEDP 27 mmHg).  EP Procedures and Devices:  ICD placement (05/31/17,Dr. Camnitz): St. Jude single-chamber deveice  Event monitor (01/2017): Final report pending preliminary review today demonstrates normal sinus rhythm with no arrhythmias during triggered events.  Non-Invasive Evaluation(s):  Transthoracic echocardiogram (02/11/2019): Normal LV size and wall thickness.  LVEF 40-45% with mid and apical anterior, and anteroseptal akinesis.  Normal RV size and function.  No significant valvular abnormality.  Limited TTE (01/23/17): Dilated left ventricle with normal wall thickness. LVEF 35-40% with apical anterior, apical septal, and apical inferior hypokinesis. Mild to moderate MR. Mild left atrial enlargement. Normal RV size and function.  Transthoracic echocardiogram (11/06/16): LVEF 30-35% with severe hypokinesis of mid/apical septal, anterior, and lateral walls, as well as the apex. Moderate to severe MR. Mild left atrial enlargment. Moderate pulmonary hypertension.  Transthoracic echocardiogram  (09/13/16): Normal LV size and contraction (EF 60-65%). Normal diastolic function. Trivial mitral and tricuspid regurgitation. Normal RV size and function.  Labs/Other Tests and Data Reviewed:    EKG:  {EKG/Telemetry Strips Reviewed:[id number]}  Recent Labs: 02/24/2019: ALT 17; BUN 15; Creatinine, Ser 0.90; Potassium 4.1; Sodium 140   Recent Lipid Panel Lab Results  Component Value Date/Time   CHOL 152 10/09/2018 09:35 AM   TRIG 95 10/09/2018 09:35 AM   HDL 61 10/09/2018 09:35 AM   CHOLHDL 2.5 10/09/2018 09:35 AM   CHOLHDL 2 03/30/2017 03:21 PM   LDLCALC 72 10/09/2018 09:35 AM    Wt Readings from Last 3 Encounters:  03/06/19 164 lb (74.4 kg)  02/11/19 163 lb 6.4 oz (74.1 kg)  01/13/19 167 lb (75.8 kg)     Objective:    Vital Signs:  There were no vitals taken for this visit.   {[facility] Virtual Exam (Optional):[phone]::"VITAL SIGNS:  reviewed"}  ASSESSMENT & PLAN:    1. ***  COVID-19 Education: The signs and symptoms of COVID-19 were discussed with the patient and how to seek care for testing (follow up with PCP or arrange E-visit).  ***The importance of social distancing was discussed today.  Time:   Today, I have spent *** minutes with the patient with telehealth technology discussing the above problems.  Medication Adjustments/Labs and Tests Ordered: Current medicines are reviewed at length with the patient today.  Concerns regarding medicines are outlined above.   Tests Ordered: No orders of the defined types were placed in this encounter.   Medication Changes: No orders of the defined types were placed in this encounter.   Follow Up:  {F/U Format:[phone]} {follow up:15908}  Signed, Nelva Bush, MD  02/12/2020 7:01 AM    [place]

## 2020-02-13 ENCOUNTER — Encounter: Payer: Self-pay | Admitting: Internal Medicine

## 2020-02-17 NOTE — Progress Notes (Signed)
Virtual Visit via Telephone Note   This visit type was conducted due to national recommendations for restrictions regarding the COVID-19 Pandemic (e.g. social distancing) in an effort to limit this patient's exposure and mitigate transmission in our community.  Due to her co-morbid illnesses, this patient is at least at moderate risk for complications without adequate follow up.  This format is felt to be most appropriate for this patient at this time.  The patient did not have access to video technology/had technical difficulties with video requiring transitioning to audio format only (telephone).  All issues noted in this document were discussed and addressed.  No physical exam could be performed with this format.  Please refer to the patient's chart for her  consent to telehealth for [facility].   The patient was identified using 2 identifiers.  Date:  02/19/2020   ID:  Crystal Roberson, DOB [birth year], MRN [id number]  Patient Location: Home Provider Location: Home  PCP:  Leone Haven, MD  Cardiologist:  Nelva Bush, MD  Electrophysiologist:  None   Evaluation Performed:  Follow-Up Visit  Chief Complaint: Follow-up heart failure and coronary artery disease  History of Present Illness:    Crystal Roberson is a 54 y.o. female with of coronary artery disease status post anterior STEMI in 41/9622,[id number] systolic heart failure due toischemic cardiomyopathystatus post ICD(05/2016), hypertensionwith suspicion for hyperaldosteronism, migraine headaches, degenerative disc disease with chronic back pain, and depression.  We last spoke in 02/2019 following a hospitalization for atypical chest pain in 01/2019.  Crystal Roberson reported feeling back to baseline with resolution of her right-sided chest and upper back pain with Zanaflex and morphine.  Today, Crystal Roberson reports that she feels about the same as when we last spoke a year ago.  She has chronic exertional dyspnea with modest activity.   However, she is most limited by her chronic back pain.  She notes having put on quite a bit of weight over the last year due to inactivity.  She has not had any chest pain, palpitations, or lightheadedness.  She continues to have labile blood pressure, though this seems to be less pronounced than over the last few years.  She even had a 27-month period recently during which she did not have any low blood pressure readings.  Pressures at home now are typically elevated with some systolic pressures up to 119 mmHg.  The patient does not have symptoms concerning for COVID-19 infection (fever, chills, cough, or new shortness of breath).    Past Medical History:  Diagnosis Date  . Allergy   . CAD (coronary artery disease)    a. 08/2016 Ant STEMI/PCI: LAD 37m (2.25x20 Promus Premier DES); b. 04/2017 Cath: LM nl, LAD 20ost, 70ost->vasospasm, Patent mid stent, 50d, D1/2/3 small, LCX 20ost, 30m, OM1/2/3 small, RCA 71m, 20d, EF 30-35%, mild to mod MR.  . Chicken pox   . Chronic back pain   . Chronic fatigue   . DDD (degenerative disc disease), lumbar   . Depression   . HFrEF (heart failure with reduced ejection fraction) ([place])    a. 01/2017 Echo: Ef 35-40%, septal apical, distal inf, anterior HK. Mod dil LV. Mild to mod MR. mildly dil LA; b. 04/2017 LV Gram: EF 30-35%.  . Hyperlipidemia LDL goal <70   . Hypertension   . Ischemic cardiomyopathy    a. 01/2017 Echo: EF 35-40%; b. 04/2017 LV Gram: EF 30-35%; c. 05/2017 s/p SJM 1411-36Q Ellipse VR RV only AICD.  . Migraines   .  Syncope 09/12/2016   Past Surgical History:  Procedure Laterality Date  . CARDIAC CATHETERIZATION N/A 09/23/2016   Procedure: Left Heart Cath and Coronary Angiography;  Surgeon: Yvonne Kendall, MD;  Location[facility] INVASIVE CV LAB;  Service: Cardiovascular;  Laterality: N/A;  . CARDIAC CATHETERIZATION N/A 09/23/2016   Procedure: Coronary Stent Intervention;  Surgeon: Yvonne Kendall, MD;  Location: [facility] INVASIVE CV LAB;  Service:  Cardiovascular;  Laterality: N/A;  . CERVICAL DISC SURGERY    . ICD IMPLANT N/A 05/31/2017   Procedure: ICD Implant;  Surgeon: Regan Lemming, MD;  Location: [facility] INVASIVE CV LAB;  Service: Cardiovascular;  Laterality: N/A;  . RIGHT/LEFT HEART CATH AND CORONARY ANGIOGRAPHY N/A 04/26/2017   Procedure: Right/Left Heart Cath and Coronary Angiography;  Surgeon: Yvonne Kendall, MD;  Location: [facility] INVASIVE CV LAB;  Service: Cardiovascular;  Laterality: N/A;  . TUBAL LIGATION    . ULTRASOUND GUIDANCE FOR VASCULAR ACCESS  04/26/2017   Procedure: Ultrasound Guidance For Vascular Access;  Surgeon: Yvonne Kendall, MD;  Location: [facility] INVASIVE CV LAB;  Service: Cardiovascular;;     Current Meds  Medication Sig  . aspirin 81 MG chewable tablet Chew 1 tablet (81 mg total) by mouth daily.  Marland Kitchen atorvastatin (LIPITOR) 40 MG tablet Take 1 tablet (40 mg total) by mouth daily. Please call [phone] to schedule appointment for further refills. Thank you!  Marland Kitchen buPROPion (WELLBUTRIN XL) 150 MG 24 hr tablet TAKE 1 TABLET BY MOUTH ONCE DAILY  . clopidogrel (PLAVIX) 75 MG tablet TAKE 1 TABLET BY MOUTH ONCE DAILY  . furosemide (LASIX) 20 MG tablet Take 1 tablet (20 mg) by mouth every day for 1 week, then take 1 tablet (20 mg) by mouth every other day. (Patient taking differently: Take 20 mg by mouth daily as needed for fluid. )  . lidocaine (LIDODERM) 5 % Place 2-3 patches onto the skin every 12 (twelve) hours as needed (pain). Remove & Discard patch within 12 hours or as directed by MD   . lisinopril (ZESTRIL) 10 MG tablet Take 1 tablet (10 mg total) by mouth 2 (two) times daily.  Marland Kitchen morphine (MS CONTIN) 15 MG 12 hr tablet Take 15 mg by mouth every 8 (eight) hours.   . nitroGLYCERIN (NITROSTAT) 0.4 MG SL tablet Place 1 tablet (0.4 mg total) under the tongue every 5 (five) minutes as needed for chest pain (CP or SOB).  . potassium chloride SA (KLOR-CON) 20 MEQ tablet TAKE 1 TABLET BY MOUTH ONCE DAILY *IF YOU TAKE LASIX TAKE  AN EXTRA DOSE ASDIRECTED*  . spironolactone (ALDACTONE) 25 MG tablet TAKE 1/2 TABLET BY MOUTH TWICE A DAY AS NEEDED  . tiZANidine (ZANAFLEX) 4 MG tablet Take 4-8 mg by mouth daily.   . [DISCONTINUED] carvedilol (COREG) 3.125 MG tablet TAKE 1 TABLET BY MOUTH 2 TIMES DAILY     Allergies:   Pregabalin and Codeine   Social History   Tobacco Use  . Smoking status: Former Smoker    Packs/day: 0.75    Years: 30.00    Pack years: 22.50    Types: Cigarettes    Quit date: 09/23/2016    Years since quitting: 3.4  . Smokeless tobacco: Never Used  Substance Use Topics  . Alcohol use: No    Alcohol/week: 0.0 standard drinks  . Drug use: No     Family Hx: The patient's family history includes Arthritis in her mother; Heart disease in her maternal grandmother; Hyperlipidemia in her mother; Hypertension in her maternal grandmother and mother; Mental  illness in her mother.  ROS:   Please see the history of present illness.   All other systems reviewed and are negative.   Prior CV studies:   The following studies were reviewed today:  Cath/PCI:  LHC/RHC (04/26/17): LMCA normal. LAD with ostial vasospasm improved to 20% with intracoronary nitroglycerin. Patent stent in mid LAD. Distal LAD shows 50% stenosis, unchanged. There are 20% ostial and 30% mid LCx lesions. Sequential 40% and 20% mid and distal RCA stenoses present. LVEDP 15-18 mmHg. LVEF 30-35% with anterior and apical akinesis. RA 6, RV 32/5, PA 32/12 (19), PCWP 17. AO sat 93%. PA sat 64%. Fick CO/CI 4.8/2.8.  LHC/PCI (09/23/16): Anterior STEMI with 99% mid LAD stenosis with TIMI 2 flow. Mild to moderate, nonobstructive CAD involving the ostial and distal LAD, ostial and mid LCx, and mid/distal RCA. Severely reduced LV contraction (EF 25-35%) with anterior, apical, and apical inferior akinesis. Moderately elevated left ventricular filling pressure (LVEDP 27 mmHg).  EP Procedures and Devices:  ICD placement (05/31/17,Dr. Camnitz): [employer] single-chamber deveice  Event monitor (01/2017): Final report pending preliminary review today demonstrates normal sinus rhythm with no arrhythmias during triggered events.  Non-Invasive Evaluation(s):  Transthoracic echocardiogram (02/11/2019): Normal LV size and wall thickness.  LVEF 40-45% with mid and apical anterior, and anteroseptal akinesis.  Normal RV size and function.  No significant valvular abnormality.  Limited TTE (01/23/17): Dilated left ventricle with normal wall thickness. LVEF 35-40% with apical anterior, apical septal, and apical inferior hypokinesis. Mild to moderate MR. Mild left atrial enlargement. Normal RV size and function.  Transthoracic echocardiogram (11/06/16): LVEF 30-35% with severe hypokinesis of mid/apical septal, anterior, and lateral walls, as well as the apex. Moderate to severe MR. Mild left atrial enlargment. Moderate pulmonary hypertension.  Transthoracic echocardiogram (09/13/16): Normal LV size and contraction (EF 60-65%). Normal diastolic function. Trivial mitral and tricuspid regurgitation. Normal RV size and function.  Labs/Other Tests and Data Reviewed:    EKG:  No ECG reviewed.  Recent Labs: 02/24/2019: ALT 17; BUN 15; Creatinine, Ser 0.90; Potassium 4.1; Sodium 140   Recent Lipid Panel Lab Results  Component Value Date/Time   CHOL 152 10/09/2018 09:35 AM   TRIG 95 10/09/2018 09:35 AM   HDL 61 10/09/2018 09:35 AM   CHOLHDL 2.5 10/09/2018 09:35 AM   CHOLHDL 2 03/30/2017 03:21 PM   LDLCALC 72 10/09/2018 09:35 AM    Wt Readings from Last 3 Encounters:  02/19/20 174 lb (78.9 kg)  03/06/19 164 lb (74.4 kg)  02/11/19 163 lb 6.4 oz (74.1 kg)     Objective:    Vital Signs:  BP (!) 168/82 (BP Location: Left Arm, Patient Position: Sitting, Cuff Size: Normal)   Pulse 79   Ht 5\' 5"  (1.651 m)   Wt 174 lb (78.9 kg)   BMI 28.96 kg/m    VITAL SIGNS:  reviewed  ASSESSMENT & PLAN:    Coronary artery disease: Crystal Roberson does not report  angina.  Chronic exertional dyspnea is stable.  We will continue her current medications for secondary prevention, including long-term dual antiplatelet therapy.  We will have her come in for a CBC, CMP, and lipid panel at her convenience.  Chronic HFrEF due to ischemic cardiomyopathy: Crystal Roberson has stable NYHA class II-3 symptoms.  Given elevated blood pressure today, I have recommended increasing carvedilol to 6.25 mg twice daily.  We will continue her current doses of lisinopril and spironolactone, with CMP at her convenience.  Hypertension: Suboptimally controlled.  Will  increase carvedilol carefully, given history of labile blood pressures and symptomatic hypotension in the past.  Hyperlipidemia: Obtain fasting lipid panel at the patient's convenience.  Continue atorvastatin.  COVID-19 Education: The signs and symptoms of COVID-19 were discussed with the patient and how to seek care for testing (follow up with PCP or arrange E-visit).  The importance of social distancing was discussed today.  Time:   Today, I have spent 20 minutes with the patient with telehealth technology discussing the above problems.     Medication Adjustments/Labs and Tests Ordered: Current medicines are reviewed at length with the patient today.  Concerns regarding medicines are outlined above.   Tests Ordered: Orders Placed This Encounter  Procedures  . CBC  . Comprehensive metabolic panel  . Lipid panel    Medication Changes: Meds ordered this encounter  Medications  . carvedilol (COREG) 6.25 MG tablet    Sig: Take 1 tablet (6.25 mg total) by mouth 2 (two) times daily.    Dispense:  180 tablet    Refill:  1    Follow Up:  In Person in 3 month(s)  Signed, Yvonne Kendall, MD  02/19/2020 2:49 PM    [facility]

## 2020-02-19 ENCOUNTER — Other Ambulatory Visit: Payer: Self-pay

## 2020-02-19 ENCOUNTER — Telehealth (INDEPENDENT_AMBULATORY_CARE_PROVIDER_SITE_OTHER): Payer: Self-pay | Admitting: Internal Medicine

## 2020-02-19 ENCOUNTER — Encounter: Payer: Self-pay | Admitting: Internal Medicine

## 2020-02-19 VITALS — BP 168/82 | HR 79 | Ht 65.0 in | Wt 174.0 lb

## 2020-02-19 DIAGNOSIS — I2510 Atherosclerotic heart disease of native coronary artery without angina pectoris: Secondary | ICD-10-CM

## 2020-02-19 DIAGNOSIS — Z79899 Other long term (current) drug therapy: Secondary | ICD-10-CM

## 2020-02-19 DIAGNOSIS — I5022 Chronic systolic (congestive) heart failure: Secondary | ICD-10-CM

## 2020-02-19 DIAGNOSIS — E785 Hyperlipidemia, unspecified: Secondary | ICD-10-CM

## 2020-02-19 DIAGNOSIS — I10 Essential (primary) hypertension: Secondary | ICD-10-CM

## 2020-02-19 MED ORDER — CARVEDILOL 6.25 MG PO TABS: 6.2500 mg | ORAL_TABLET | Freq: Two times a day (BID) | ORAL | 1 refills | Status: DC

## 2020-02-19 NOTE — Progress Notes (Signed)
Called patient and she verbalized understanding of the AVS instructions.

## 2020-02-19 NOTE — Patient Instructions (Signed)
Medication Instructions:  Your physician has recommended you make the following change in your medication:  1- INCREASE Carvedilol 6.25 mg by mouth two times a day.  *If you need a refill on your cardiac medications before your next appointment, please call your pharmacy*   Lab Work: Your physician recommends that you return for lab work in: at your earliest convenience. - We will check CBC, CMP, fasting LIPID panel. - You will need to be fasting. Please do not have anything to eat or drink after midnight the morning you have the lab work. You may only have water or black coffee with no cream or sugar. - Please go to the [facility]. You will check in at the front desk to the right as you walk into the atrium. Valet Parking is offered if needed. - No appointment needed. You may go any day between 7 am and 6 pm.  If you have labs (blood work) drawn today and your tests are completely normal, you will receive your results only by: Marland Kitchen MyChart Message (if you have MyChart) OR . A paper copy in the mail If you have any lab test that is abnormal or we need to change your treatment, we will call you to review the results.   Testing/Procedures: none   Follow-Up: At [facility], you and your health needs are our priority.  As part of our continuing mission to provide you with exceptional heart care, we have created designated Provider Care Teams.  These Care Teams include your primary Cardiologist (physician) and Advanced Practice Providers (APPs -  Physician Assistants and Nurse Practitioners) who all work together to provide you with the care you need, when you need it.  We recommend signing up for the patient portal called "MyChart".  Sign up information is provided on this After Visit Summary.  MyChart is used to connect with patients for Virtual Visits (Telemedicine).  Patients are able to view lab/test results, encounter notes, upcoming appointments, etc.  Non-urgent messages can be  sent to your provider as well.   To learn more about what you can do with MyChart, go to [email or web address].    Your next appointment:   3 month(s)  The format for your next appointment:   In Person  Provider:    You may see Yvonne Kendall, MD or one of the following Advanced Practice Providers on your designated Care Team:    Nicolasa Ducking, NP  Eula Listen, PA-C  Marisue Ivan, PA-C

## 2020-02-26 ENCOUNTER — Other Ambulatory Visit: Payer: Self-pay | Admitting: Internal Medicine

## 2020-04-21 ENCOUNTER — Other Ambulatory Visit: Payer: Self-pay | Admitting: Internal Medicine

## 2020-04-21 ENCOUNTER — Other Ambulatory Visit: Payer: Self-pay | Admitting: Family Medicine

## 2020-06-01 ENCOUNTER — Telehealth: Payer: Self-pay | Admitting: Internal Medicine

## 2020-06-01 NOTE — Telephone Encounter (Signed)
Patient's last 3 visits have been Telemedicine.  Would suggest rescheduling to in person visit in the next week or so when she's able to accommodate for her dog's injury. If acute symptoms, may keep virtual.

## 2020-06-01 NOTE — Telephone Encounter (Signed)
Patient wants to do a virtual visit as she is unable to leave home ( dog injury)   Please advise if ok to change or reschedule in office.

## 2020-06-02 ENCOUNTER — Ambulatory Visit: Payer: Self-pay | Admitting: Internal Medicine

## 2020-07-19 ENCOUNTER — Other Ambulatory Visit: Payer: Self-pay | Admitting: Internal Medicine

## 2020-07-21 ENCOUNTER — Other Ambulatory Visit: Payer: Self-pay | Admitting: Internal Medicine

## 2020-08-26 ENCOUNTER — Other Ambulatory Visit: Payer: Self-pay | Admitting: Family Medicine

## 2020-08-27 ENCOUNTER — Ambulatory Visit: Payer: Self-pay | Admitting: Internal Medicine

## 2020-08-27 NOTE — Progress Notes (Deleted)
Follow-up Outpatient Visit Date: 08/27/2020  Primary Care Provider: Glori Luis, MD [address] [place] [address]  Chief Complaint: ***  HPI:  Ms. Wrubel is a 54 y.o. female with history of coronary artery disease status post anterior STEMI in 08/2016,chronic systolic heart failure due toischemic cardiomyopathystatus post ICD(05/2016), hypertensionwith suspicion for hyperaldosteronism, migraine headaches, degenerative disc disease with chronic back pain, and depression, who presents for follow-up of coronary artery disease and heart failure.  We last spoke via virtual visit in late May, at which time Ms. Viar continued experience exertional dyspnea with modest activity.  She reported having put on quite a bit of weight due to inactivity driven by her chronic back pain.  Blood pressures remained somewhat labile.  We agreed to increase carvedilol to 6.25 mg twice daily.  Follow-up labs were recommended, though it does not appear that they were ever collected.  --------------------------------------------------------------------------------------------------  Past Medical History:  Diagnosis Date  . Allergy   . CAD (coronary artery disease)    a. 08/2016 Ant STEMI/PCI: LAD 47m (2.25x20 Promus Premier DES); b. 04/2017 Cath: LM nl, LAD 20ost, 70ost->vasospasm, Patent mid stent, 50d, D1/2/3 small, LCX 20ost, 49m, OM1/2/3 small, RCA 53m, 20d, EF 30-35%, mild to mod MR.  . Chicken pox   . Chronic back pain   . Chronic fatigue   . DDD (degenerative disc disease), lumbar   . Depression   . HFrEF (heart failure with reduced ejection fraction) ([facility])    a. 01/2017 Echo: Ef 35-40%, septal apical, distal inf, anterior HK. Mod dil LV. Mild to mod MR. mildly dil LA; b. 04/2017 LV Gram: EF 30-35%.  . Hyperlipidemia LDL goal <70   . Hypertension   . Ischemic cardiomyopathy    a. 01/2017 Echo: EF 35-40%; b. 04/2017 LV Gram: EF 30-35%; c. 05/2017 s/p SJM 1411-36Q Ellipse VR RV only  AICD.  . Migraines   . Syncope 09/12/2016   Past Surgical History:  Procedure Laterality Date  . CARDIAC CATHETERIZATION N/A 09/23/2016   Procedure: Left Heart Cath and Coronary Angiography;  Surgeon: Yvonne Kendall, MD;  Location: [facility] INVASIVE CV LAB;  Service: Cardiovascular;  Laterality: N/A;  . CARDIAC CATHETERIZATION N/A 09/23/2016   Procedure: Coronary Stent Intervention;  Surgeon: Yvonne Kendall, MD;  Location: [facility] INVASIVE CV LAB;  Service: Cardiovascular;  Laterality: N/A;  . CERVICAL DISC SURGERY    . ICD IMPLANT N/A 05/31/2017   Procedure: ICD Implant;  Surgeon: Regan Lemming, MD;  Location: [facility] INVASIVE CV LAB;  Service: Cardiovascular;  Laterality: N/A;  . RIGHT/LEFT HEART CATH AND CORONARY ANGIOGRAPHY N/A 04/26/2017   Procedure: Right/Left Heart Cath and Coronary Angiography;  Surgeon: Yvonne Kendall, MD;  Location: [facility] INVASIVE CV LAB;  Service: Cardiovascular;  Laterality: N/A;  . TUBAL LIGATION    . ULTRASOUND GUIDANCE FOR VASCULAR ACCESS  04/26/2017   Procedure: Ultrasound Guidance For Vascular Access;  Surgeon: Yvonne Kendall, MD;  Location: [facility] INVASIVE CV LAB;  Service: Cardiovascular;;    No outpatient medications have been marked as taking for the 08/27/20 encounter (Appointment) with Lillias Difrancesco, Cristal Deer, MD.    Allergies: Pregabalin and Codeine  Social History   Tobacco Use  . Smoking status: Former Smoker    Packs/day: 0.75    Years: 30.00    Pack years: 22.50    Types: Cigarettes    Quit date: 09/23/2016    Years since quitting: 3.9  . Smokeless tobacco: Never Used  Vaping Use  . Vaping Use: Never used  Substance Use Topics  . Alcohol use: No    Alcohol/week: 0.0 standard drinks  . Drug use: No    Family History  Problem Relation Age of Onset  . Hypertension Mother   . Arthritis Mother   . Hyperlipidemia Mother   . Mental illness Mother   . Hypertension Maternal Grandmother   . Heart disease Maternal Grandmother     Review of Systems: A  12-system review of systems was performed and was negative except as noted in the HPI.  --------------------------------------------------------------------------------------------------  Physical Exam: There were no vitals taken for this visit.  General:  *** HEENT: No conjunctival pallor or scleral icterus. Facemask in place. Neck: Supple without lymphadenopathy, thyromegaly, JVD, or HJR. Lungs: Normal work of breathing. Clear to auscultation bilaterally without wheezes or crackles. Heart: Regular rate and rhythm without murmurs, rubs, or gallops. Non-displaced PMI. Abd: Bowel sounds present. Soft, NT/ND without hepatosplenomegaly Ext: No lower extremity edema. Radial, PT, and DP pulses are 2+ bilaterally. Skin: Warm and dry without rash.  EKG:  ***  Lab Results  Component Value Date   WBC 9.2 02/10/2019   HGB 14.0 02/10/2019   HCT 41.0 02/10/2019   MCV 86.5 02/10/2019   PLT 173 02/10/2019    Lab Results  Component Value Date   NA 140 02/24/2019   K 4.1 02/24/2019   CL 107 02/24/2019   CO2 25 02/24/2019   BUN 15 02/24/2019   CREATININE 0.90 02/24/2019   GLUCOSE 128 (H) 02/24/2019   ALT 17 02/24/2019    Lab Results  Component Value Date   CHOL 152 10/09/2018   HDL 61 10/09/2018   LDLCALC 72 10/09/2018   TRIG 95 10/09/2018   CHOLHDL 2.5 10/09/2018    --------------------------------------------------------------------------------------------------  ASSESSMENT AND PLAN: Cristal Deer Alleigh Mollica, MD 08/27/2020 7:31 AM

## 2020-08-30 ENCOUNTER — Encounter: Payer: Self-pay | Admitting: Internal Medicine

## 2020-11-11 ENCOUNTER — Other Ambulatory Visit: Payer: Self-pay

## 2020-11-11 ENCOUNTER — Ambulatory Visit (INDEPENDENT_AMBULATORY_CARE_PROVIDER_SITE_OTHER): Payer: Self-pay

## 2020-11-11 ENCOUNTER — Ambulatory Visit (INDEPENDENT_AMBULATORY_CARE_PROVIDER_SITE_OTHER): Payer: Self-pay | Admitting: Emergency Medicine

## 2020-11-11 DIAGNOSIS — I255 Ischemic cardiomyopathy: Secondary | ICD-10-CM

## 2020-11-11 LAB — CUP PACEART INCLINIC DEVICE CHECK
Battery Remaining Longevity: 76 mo
Brady Statistic RV Percent Paced: 0 %
Date Time Interrogation Session: 20220217105205
HighPow Impedance: 74.25 Ohm
Implantable Lead Implant Date: 20180906
Implantable Lead Location: 753860
Implantable Pulse Generator Implant Date: 20180906
Lead Channel Impedance Value: 387.5 Ohm
Lead Channel Pacing Threshold Amplitude: 1 V
Lead Channel Pacing Threshold Pulse Width: 0.5 ms
Lead Channel Sensing Intrinsic Amplitude: 12 mV
Lead Channel Setting Pacing Amplitude: 2.5 V
Lead Channel Setting Pacing Pulse Width: 0.5 ms
Lead Channel Setting Sensing Sensitivity: 0.5 mV
Pulse Gen Serial Number: 9783396

## 2020-11-11 NOTE — Progress Notes (Signed)
ICD check in clinic. Normal device function. Thresholds and sensing consistent with previous device measurements. Impedance trends stable over time. No ventricular arrhythmias. Histogram distribution appropriate for patient and level of activity. No changes made this session. Device programmed at appropriate safety margins. Device programmed to optimize intrinsic conduction. Estimated longevity 6.2-6.4 years. Pt enrolled in remote follow-up 12/16/20.  Patients RF telemetry enabled today by [employer] rep [place].  Patient education completed.

## 2020-11-11 NOTE — Patient Instructions (Signed)
Please call if you have further questions or concerns.  Device Clinic [phone]

## 2020-11-12 LAB — CUP PACEART REMOTE DEVICE CHECK
Battery Remaining Longevity: 73 mo
Battery Remaining Percentage: 73 %
Battery Voltage: 2.99 V
Brady Statistic RV Percent Paced: 0 %
Date Time Interrogation Session: 20220217153505
HighPow Impedance: 74 Ohm
HighPow Impedance: 77 Ohm
Implantable Lead Implant Date: 20180906
Implantable Lead Location: 753860
Implantable Pulse Generator Implant Date: 20180906
Lead Channel Impedance Value: 390 Ohm
Lead Channel Pacing Threshold Amplitude: 1 V
Lead Channel Pacing Threshold Pulse Width: 0.5 ms
Lead Channel Sensing Intrinsic Amplitude: 12 mV
Lead Channel Setting Pacing Amplitude: 2.5 V
Lead Channel Setting Pacing Pulse Width: 0.5 ms
Lead Channel Setting Sensing Sensitivity: 0.5 mV
Pulse Gen Serial Number: 9783396

## 2020-11-17 NOTE — Progress Notes (Signed)
Remote ICD transmission.   

## 2020-12-06 ENCOUNTER — Other Ambulatory Visit: Payer: Self-pay | Admitting: Internal Medicine

## 2021-01-17 ENCOUNTER — Other Ambulatory Visit: Payer: Self-pay | Admitting: Internal Medicine

## 2021-01-17 NOTE — Telephone Encounter (Signed)
Attempted to schedule.  LMOV to call office.  ° °

## 2021-01-17 NOTE — Telephone Encounter (Signed)
Please contact pt for future appointment. ?Pt overdue for 3 month f/u. ?

## 2021-01-20 NOTE — Telephone Encounter (Signed)
LMV for patient to call back    ?

## 2021-01-21 NOTE — Telephone Encounter (Signed)
Attempted to schedule.  LMOV to call office.  ° °

## 2021-01-26 NOTE — Telephone Encounter (Signed)
Unable to reach x 4 .  Closing encounter.

## 2021-01-29 ENCOUNTER — Other Ambulatory Visit: Payer: Self-pay | Admitting: Internal Medicine

## 2021-02-10 ENCOUNTER — Ambulatory Visit (INDEPENDENT_AMBULATORY_CARE_PROVIDER_SITE_OTHER): Payer: Self-pay | Admitting: Internal Medicine

## 2021-02-10 ENCOUNTER — Other Ambulatory Visit: Payer: Self-pay

## 2021-02-10 ENCOUNTER — Ambulatory Visit (INDEPENDENT_AMBULATORY_CARE_PROVIDER_SITE_OTHER): Payer: Self-pay

## 2021-02-10 ENCOUNTER — Encounter: Payer: Self-pay | Admitting: Internal Medicine

## 2021-02-10 ENCOUNTER — Other Ambulatory Visit
Admission: RE | Admit: 2021-02-10 | Discharge: 2021-02-10 | Disposition: A | Discharge status: Home / Self Care | Payer: Self-pay | Source: Ambulatory Visit | Attending: Internal Medicine | Admitting: Internal Medicine

## 2021-02-10 VITALS — BP 140/90 | HR 74 | Ht 64.0 in | Wt 175.5 lb

## 2021-02-10 DIAGNOSIS — Z79899 Other long term (current) drug therapy: Secondary | ICD-10-CM | POA: Insufficient documentation

## 2021-02-10 DIAGNOSIS — E785 Hyperlipidemia, unspecified: Secondary | ICD-10-CM

## 2021-02-10 DIAGNOSIS — I25119 Atherosclerotic heart disease of native coronary artery with unspecified angina pectoris: Secondary | ICD-10-CM | POA: Insufficient documentation

## 2021-02-10 DIAGNOSIS — I2510 Atherosclerotic heart disease of native coronary artery without angina pectoris: Secondary | ICD-10-CM

## 2021-02-10 DIAGNOSIS — I255 Ischemic cardiomyopathy: Secondary | ICD-10-CM

## 2021-02-10 DIAGNOSIS — I5022 Chronic systolic (congestive) heart failure: Secondary | ICD-10-CM

## 2021-02-10 DIAGNOSIS — R0989 Other specified symptoms and signs involving the circulatory and respiratory systems: Secondary | ICD-10-CM | POA: Insufficient documentation

## 2021-02-10 LAB — CBC
HCT: 40.7 % (ref 36.0–46.0)
Hemoglobin: 14.1 g/dL (ref 12.0–15.0)
MCH: 29.7 pg (ref 26.0–34.0)
MCHC: 34.6 g/dL (ref 30.0–36.0)
MCV: 85.7 fL (ref 80.0–100.0)
Platelets: 203 10*3/uL (ref 150–400)
RBC: 4.75 MIL/uL (ref 3.87–5.11)
RDW: 11.9 % (ref 11.5–15.5)
WBC: 8.4 10*3/uL (ref 4.0–10.5)
nRBC: 0 % (ref 0.0–0.2)

## 2021-02-10 LAB — COMPREHENSIVE METABOLIC PANEL
ALT: 24 U/L (ref 0–44)
AST: 19 U/L (ref 15–41)
Albumin: 4.1 g/dL (ref 3.5–5.0)
Alkaline Phosphatase: 133 U/L — ABNORMAL HIGH (ref 38–126)
Anion gap: 8 (ref 5–15)
BUN: 15 mg/dL (ref 6–20)
CO2: 25 mmol/L (ref 22–32)
Calcium: 9.3 mg/dL (ref 8.9–10.3)
Chloride: 106 mmol/L (ref 98–111)
Creatinine, Ser: 1.1 mg/dL — ABNORMAL HIGH (ref 0.44–1.00)
GFR, Estimated: 59 mL/min — ABNORMAL LOW (ref >60)
Glucose, Bld: 118 mg/dL — ABNORMAL HIGH (ref 70–99)
Potassium: 4 mmol/L (ref 3.5–5.1)
Sodium: 139 mmol/L (ref 135–145)
Total Bilirubin: 0.7 mg/dL (ref 0.3–1.2)
Total Protein: 7.5 g/dL (ref 6.5–8.1)

## 2021-02-10 LAB — LIPID PANEL
Cholesterol: 251 mg/dL — ABNORMAL HIGH (ref 0–200)
HDL: 64 mg/dL (ref >40)
LDL Cholesterol: 153 mg/dL — ABNORMAL HIGH (ref 0–99)
Total CHOL/HDL Ratio: 3.9 RATIO
Triglycerides: 168 mg/dL — ABNORMAL HIGH (ref <150)
VLDL: 34 mg/dL (ref 0–40)

## 2021-02-10 MED ORDER — LISINOPRIL 10 MG PO TABS: 15.0000 mg | ORAL_TABLET | Freq: Two times a day (BID) | ORAL | 6 refills | Status: DC

## 2021-02-10 NOTE — Progress Notes (Signed)
Follow-up Outpatient Visit Date: 02/10/2021  Primary Care Provider: Glori Luis, MD [address] [place] [address]  Chief Complaint: Shortness of breath and chest discomfort  HPI:  Crystal Roberson is a 55 y.o. female with history of coronary artery disease status post anterior STEMI in 08/2016,chronic systolic heart failure due toischemic cardiomyopathystatus post ICD(05/2016), hypertensionwith suspicion for hyperaldosteronism, migraine headaches, degenerative disc disease with chronic back pain, and depression, who presents for follow-up of coronary artery disease and chronic HFrEF.  We last spoke via virtual visit a year ago.  Crystal Roberson was lost to follow-up since then.  At that time, she reported stable exertional dyspnea.  Her activity was most limited by her chronic back pain.  Blood pressures were labile but overall trending high with systolic readings up to 200 mmHg.  We agreed to increase carvedilol to 6.25 mg twice daily.  Her current doses of lisinopril and spironolactone were continued with plans to obtain a CMP at her convenience (this was never completed).  Today, Crystal Roberson reports feeling about the same as at our prior visit.  She notes dull, random chest pain "every now and then" that happens about once every 3 months.  There are no associated symptoms.  Chronic exertional dyspnea is unchanged.  She has been trying to walk some but notes that this is occasionally limited by her back pain as well as depression.  She continues to have episodic episodes of lightheadedness and associated soft blood pressure.  She has not fallen or passed out.  Other times, her blood pressure remains elevated.  She reports being compliant with her medications.  She denies palpitations, orthopnea, PND, and edema.  No ICD shocks.  --------------------------------------------------------------------------------------------------  Cardiovascular History & Procedures: Cardiovascular  Problems:  Coronary artery disease status post anterior STEMI (09/23/16)  Ischemic cardiomyopathy with severe LV systolic dysfunction  Risk Factors:  Known coronary artery disease, hypertension, and tobacco use  Cath/PCI:  LHC/RHC (04/26/17): LMCA normal. LAD with ostial vasospasm improved to 20% with intracoronary nitroglycerin. Patent stent in mid LAD. Distal LAD shows 50% stenosis, unchanged. There are 20% ostial and 30% mid LCx lesions. Sequential 40% and 20% mid and distal RCA stenoses present. LVEDP 15-18 mmHg. LVEF 30-35% with anterior and apical akinesis. RA 6, RV 32/5, PA 32/12 (19), PCWP 17. AO sat 93%. PA sat 64%. Fick CO/CI 4.8/2.8.  LHC/PCI (09/23/16): Anterior STEMI with 99% mid LAD stenosis with TIMI 2 flow. Mild to moderate, nonobstructive CAD involving the ostial and distal LAD, ostial and mid LCx, and mid/distal RCA. Severely reduced LV contraction (EF 25-35%) with anterior, apical, and apical inferior akinesis. Moderately elevated left ventricular filling pressure (LVEDP 27 mmHg).  CV Surgery:  None  EP Procedures and Devices:  ICD placement (05/31/17,Dr. Camnitz): [employer] Jude single-chamber deveice  Event monitor (01/2017): Final report pending preliminary review today demonstrates normal sinus rhythm with no arrhythmias during triggered events.  Non-Invasive Evaluation(s):  TTE (02/11/2019): Normal LV size and wall thickness.  LVEF 40-45% with akinesis of the mid/apical anterior, anteroseptal, and apical segments.  Normal RV size and function.  Normal biatrial size.  Mild mitral regurgitation.  Limited TTE (01/23/17): Dilated left ventricle with normal wall thickness. LVEF 35-40% with apical anterior, apical septal, and apical inferior hypokinesis. Mild to moderate MR. Mild left atrial enlargement. Normal RV size and function.  Transthoracic echocardiogram (11/06/16): LVEF 30-35% with severe hypokinesis of mid/apical septal, anterior, and lateral walls, as well as the  apex. Moderate to severe MR.  Mild left atrial enlargment. Moderate pulmonary hypertension.  Transthoracic echocardiogram (09/13/16): Normal LV size and contraction (EF 60-65%). Normal diastolic function. Trivial mitral and tricuspid regurgitation. Normal RV size and function.   Recent CV Pertinent Labs: Lab Results  Component Value Date   CHOL 152 10/09/2018   HDL 61 10/09/2018   LDLCALC 72 10/09/2018   TRIG 95 10/09/2018   CHOLHDL 2.5 10/09/2018   CHOLHDL 2 03/30/2017   INR 1.0 04/16/2017   BNP 160.4 (H) 11/26/2016   K 4.1 02/24/2019   K 3.4 (L) 01/04/2012   MG 2.0 02/10/2019   BUN 15 02/24/2019   BUN 12 10/09/2018   BUN 8 01/04/2012   CREATININE 0.90 02/24/2019   CREATININE 0.74 04/16/2017    Past medical and surgical history were reviewed and updated in EPIC.  Current Meds  Medication Sig  . aspirin 81 MG chewable tablet Chew 1 tablet (81 mg total) by mouth daily.  Marland Kitchen atorvastatin (LIPITOR) 40 MG tablet TAKE 1 TABLET BY MOUTH ONCE A DAY (NEED APPT FOR MORE REFILLS)  . buPROPion (WELLBUTRIN XL) 150 MG 24 hr tablet TAKE 1 TABLET BY MOUTH ONCE DAILY  . carvedilol (COREG) 6.25 MG tablet Take 1 tablet (6.25 mg total) by mouth 2 (two) times daily.  . clopidogrel (PLAVIX) 75 MG tablet TAKE 1 TABLET BY MOUTH ONCE DAILY  . lidocaine (LIDODERM) 5 % Place 2-3 patches onto the skin every 12 (twelve) hours as needed (pain). Remove & Discard patch within 12 hours or as directed by MD  . lisinopril (ZESTRIL) 10 MG tablet TAKE 1 TABLET BY MOUTH TWICE A DAY  . morphine (MS CONTIN) 15 MG 12 hr tablet Take 15 mg by mouth every 8 (eight) hours.   . nitroGLYCERIN (NITROSTAT) 0.4 MG SL tablet DISSOLVE 1 TABLET UNDER TONGUE AS NEEDEDFOR CHEST PAIN. MAY REPEAT 5 MINUTES APART 3 TIMES IF NEEDED  . potassium chloride SA (KLOR-CON) 20 MEQ tablet TAKE 1 TABLET BY MOUTH ONCE DAILY *IF YOU TAKE LASIX TAKE AN EXTRA DOSE ASDIRECTED*  . spironolactone (ALDACTONE) 25 MG tablet Take 25 mg by mouth daily.  Marland Kitchen  tiZANidine (ZANAFLEX) 4 MG tablet Take 4-8 mg by mouth daily.    Allergies: Pregabalin and Codeine  Social History   Tobacco Use  . Smoking status: Former Smoker    Packs/day: 0.75    Years: 30.00    Pack years: 22.50    Types: Cigarettes    Quit date: 09/23/2016    Years since quitting: 4.3  . Smokeless tobacco: Never Used  Vaping Use  . Vaping Use: Never used  Substance Use Topics  . Alcohol use: No    Alcohol/week: 0.0 standard drinks  . Drug use: No    Family History  Problem Relation Age of Onset  . Hypertension Mother   . Arthritis Mother   . Hyperlipidemia Mother   . Mental illness Mother   . Hypertension Maternal Grandmother   . Heart disease Maternal Grandmother     Review of Systems: Crystal Roberson reports occasional random tingling along the base of her neck on the left side.  It is transient and without associated symptoms or obvious precipitant.  Otherwise, a 12-system review of systems was performed and was negative except as noted in the HPI.  --------------------------------------------------------------------------------------------------  Physical Exam: BP 140/90 (BP Location: Right Arm, Patient Position: Sitting, Cuff Size: Normal)   Pulse 74   Ht 5\' 4"  (1.626 m)   Wt 175 lb 8 oz (79.6 kg)  SpO2 98%   BMI 30.12 kg/m   General:  NAD. Neck: No JVD or HJR. Lungs: Clear to auscultation bilaterally without wheezes or crackles. Heart: Regular rate and rhythm without murmurs, rubs, or gallops. Abdomen: Soft, nontender, nondistended. Extremities: No lower extremity edema.  EKG: Normal sinus rhythm with left atrial enlargement, anteroseptal infarct and nonspecific T wave abnormality.  No significant change from prior tracing on 02/10/2019.  Lab Results  Component Value Date   WBC 9.2 02/10/2019   HGB 14.0 02/10/2019   HCT 41.0 02/10/2019   MCV 86.5 02/10/2019   PLT 173 02/10/2019    Lab Results  Component Value Date   NA 140 02/24/2019   K  4.1 02/24/2019   CL 107 02/24/2019   CO2 25 02/24/2019   BUN 15 02/24/2019   CREATININE 0.90 02/24/2019   GLUCOSE 128 (H) 02/24/2019   ALT 17 02/24/2019    Lab Results  Component Value Date   CHOL 152 10/09/2018   HDL 61 10/09/2018   LDLCALC 72 10/09/2018   TRIG 95 10/09/2018   CHOLHDL 2.5 10/09/2018    --------------------------------------------------------------------------------------------------  ASSESSMENT AND PLAN: Coronary artery disease with without angina: Crystal Roberson continues to have transient dull chest pain that is not consistent with angina.  We will continue current medications for secondary prevention, though I will discontinue aspirin and maintain indefinite clopidogrel therapy in the setting of her prior MI and LAD PCI.  We will defer ischemia evaluation unless symptoms worsen.  I will check a CBC, CMP, and lipid panel today.  Chronic HFrEF due to ischemic cardiomyopathy: Crystal Roberson appears euvolemic on exam, reporting stable NYHA class II symptoms.  Due to suboptimal blood pressure control today, will increase lisinopril to 15 mg twice daily.  I will check a CMP today.  Ongoing follow-up of ICD per device clinic.  No changes carvedilol 6.25 mg twice daily and spironolactone 25 mg daily.  No need for standing furosemide at this time, as Crystal Roberson appears euvolemic on exam.  Labile hypertension: Blood pressure mildly elevated today.  Crystal Roberson continues to have episodic symptomatic hypotension but no syncope.  I will check a CMP today with plans to escalate lisinopril to 50 mg twice daily.  We will plan to repeat a BMP in 2 weeks to ensure stable renal function and electrolytes.  Hyperlipidemia: LDL borderline on last check in 09/2019.  We will recheck a lipid panel and CMP today with plans to titrate atorvastatin if needed for target LDL less than 70.  Follow-up: Return to clinic in 6 months.  Yvonne Kendall, MD 02/10/2021 9:49 AM

## 2021-02-10 NOTE — Patient Instructions (Signed)
Medication Instructions:  Your physician has recommended you make the following change in your medication:   STOP Aspirin  CONTINUE Plavix 75mg  daily  INCREASE Lisinopril to 15mg  TWICE daily (Take 1 1/2 tablets for 15mg  TWICE daily)   *If you need a refill on your cardiac medications before your next appointment, please call your pharmacy*   Lab Work:  1) Your physician recommends that you have lab work TODAY: CMET, CBC, Lipid panel  2) Your physician recommends that you return for lab work to the at [facility] in: 2 WEEK (BMET)      These labs are NOT fasting. -  Please go to the [facility]. You will check in at the front desk to the right as you walk into the atrium. Valet Parking is offered if needed. - No appointment needed. You may go any day between 7 am and 6 pm.   If you have labs (blood work) drawn today and your tests are completely normal, you will receive your results only by: [facility] MyChart Message (if you have MyChart) OR . A paper copy in the mail If you have any lab test that is abnormal or we need to change your treatment, we will call you to review the results.   Testing/Procedures: None ordered   Follow-Up: At [facility], you and your health needs are our priority.  As part of our continuing mission to provide you with exceptional heart care, we have created designated Provider Care Teams.  These Care Teams include your primary Cardiologist (physician) and Advanced Practice Providers (APPs -  Physician Assistants and Nurse Practitioners) who all work together to provide you with the care you need, when you need it.  We recommend signing up for the patient portal called "MyChart".  Sign up information is provided on this After Visit Summary.  MyChart is used to connect with patients for Virtual Visits (Telemedicine).  Patients are able to view lab/test results, encounter notes, upcoming appointments, etc.  Non-urgent messages can be sent to your  provider as well.   To learn more about what you can do with MyChart, go to [facility].    Your next appointment:   6 month(s)  The format for your next appointment:   In Person  Provider:   You may see Marland Kitchen, MD or one of the following Advanced Practice Providers on your designated Care Team:    [facility], NP  [email or web address], PA-C  Yvonne Kendall, PA-C  [facility]ad[facility] [place], Eula Listen  Marisue Ivan, NP

## 2021-02-11 LAB — CUP PACEART REMOTE DEVICE CHECK
Battery Remaining Longevity: 73 mo
Battery Remaining Percentage: 70 %
Battery Voltage: 2.98 V
Brady Statistic RV Percent Paced: 1 %
Date Time Interrogation Session: 20220520094418
HighPow Impedance: 74 Ohm
HighPow Impedance: 74 Ohm
Implantable Lead Implant Date: 20180906
Implantable Lead Location: 753860
Implantable Pulse Generator Implant Date: 20180906
Lead Channel Impedance Value: 360 Ohm
Lead Channel Pacing Threshold Amplitude: 1 V
Lead Channel Pacing Threshold Pulse Width: 0.5 ms
Lead Channel Sensing Intrinsic Amplitude: 12 mV
Lead Channel Setting Pacing Amplitude: 2.5 V
Lead Channel Setting Pacing Pulse Width: 0.5 ms
Lead Channel Setting Sensing Sensitivity: 0.5 mV
Pulse Gen Serial Number: 9783396

## 2021-02-13 ENCOUNTER — Telehealth: Payer: Self-pay | Admitting: Family Medicine

## 2021-02-13 NOTE — Telephone Encounter (Signed)
Please contact the patient and offer her follow-up with me. I received a message from her cardiologist about following up on a liver enzyme abnormality on her lab work and it has been almost 2 years since she has been seen in the office.

## 2021-02-14 ENCOUNTER — Other Ambulatory Visit: Payer: Self-pay | Admitting: Internal Medicine

## 2021-02-14 NOTE — Telephone Encounter (Signed)
I called and LVM for th epatient to call back and schedule a follow up visit with the provider.  Ormond Lazo,cma

## 2021-02-15 NOTE — Telephone Encounter (Signed)
Called and lvm for the patient o call back.  Crystal Roberson,cma

## 2021-02-16 ENCOUNTER — Telehealth: Payer: Self-pay | Admitting: Internal Medicine

## 2021-02-16 DIAGNOSIS — E785 Hyperlipidemia, unspecified: Secondary | ICD-10-CM

## 2021-02-16 DIAGNOSIS — Z79899 Other long term (current) drug therapy: Secondary | ICD-10-CM

## 2021-02-16 MED ORDER — ROSUVASTATIN CALCIUM 40 MG PO TABS: 40.0000 mg | ORAL_TABLET | Freq: Every day | ORAL | 1 refills | Status: DC

## 2021-02-16 NOTE — Telephone Encounter (Signed)
Crystal Luis, MD  02/13/2021 8:32 PM EDT      I have reviewed. Message sent to my CMA to contact the patient.

## 2021-02-16 NOTE — Telephone Encounter (Signed)
I spoke with the patient regarding her lab results. She confirms that she is taking atorvastatin 40 mg once daily. She is agreeable with stopping atorvastatin and starting rosuvastatin 40 mg once daily. She is aware she will need a repeat CMET/ lipid panel in 3 months. Lab orders placed.   I have also advised her that Dr. Purvis Sheffield office has also tried to reach out to her regarding her abnormal LFT's. I have asked her to please call his office to follow up.  The patient voices understanding of the above recommendations and is agreeable.

## 2021-02-16 NOTE — Telephone Encounter (Signed)
Crystal Kendall, MD  02/10/2021 5:01 PM EDT      Please let [name] know that her kidney function and electrolytes are stable. She has persistent mild elevation of her alkaline phosphatase, which is nonspecific, but can be seen with bone or liver/gallbladder disease. I suggest that she follow-up with Dr. Birdie Sons for further evaluation of this mid to have mild dilation of her common bile duct by abdominal ultrasound in 01/2019. Her blood counts are normal. Her cholesterol is poorly controlled with an LDL of 153. Please confirm that she is actually been taking her atorvastatin. If so, I suggest that we switch to rosuvastatin 40 mg daily with repeat CMP and lipid panel in 3 months. I will forward these results to Dr. Birdie Sons for his review as well.

## 2021-02-16 NOTE — Telephone Encounter (Signed)
Sampson Goon, RN  02/11/2021 8:42 AM EDT      Left message to call back

## 2021-02-22 NOTE — Telephone Encounter (Signed)
Patient is scheduled for a follow up.  Crystal Roberson,cma  ?

## 2021-02-25 ENCOUNTER — Other Ambulatory Visit: Payer: Self-pay

## 2021-02-25 ENCOUNTER — Encounter: Payer: Self-pay | Admitting: Family Medicine

## 2021-02-25 ENCOUNTER — Ambulatory Visit (INDEPENDENT_AMBULATORY_CARE_PROVIDER_SITE_OTHER): Payer: Self-pay | Admitting: Family Medicine

## 2021-02-25 VITALS — BP 118/70 | HR 71 | Temp 98.1°F | Ht 64.0 in | Wt 174.6 lb

## 2021-02-25 DIAGNOSIS — M791 Myalgia, unspecified site: Secondary | ICD-10-CM

## 2021-02-25 DIAGNOSIS — F419 Anxiety disorder, unspecified: Secondary | ICD-10-CM

## 2021-02-25 DIAGNOSIS — F331 Major depressive disorder, recurrent, moderate: Secondary | ICD-10-CM

## 2021-02-25 DIAGNOSIS — R748 Abnormal levels of other serum enzymes: Secondary | ICD-10-CM

## 2021-02-25 DIAGNOSIS — K5900 Constipation, unspecified: Secondary | ICD-10-CM | POA: Insufficient documentation

## 2021-02-25 DIAGNOSIS — R55 Syncope and collapse: Secondary | ICD-10-CM | POA: Insufficient documentation

## 2021-02-25 MED ORDER — SERTRALINE HCL 50 MG PO TABS: 50.0000 mg | ORAL_TABLET | Freq: Every day | ORAL | 3 refills | Status: DC

## 2021-02-25 NOTE — Patient Instructions (Signed)
Nice to see you. We will start you on Zoloft to see if that helps with your anxiety and depression.  If you develop thoughts of harming yourself you need to go to the emergency room. We will get lab work today to evaluate for a number of your complaints.  We will contact you with the results. I will speak with our pharmacist and contact you once I hear back from her.

## 2021-02-25 NOTE — Assessment & Plan Note (Signed)
The symptoms she had while constipated seem to be related to vasovagal episode.  Discussed that if she has recurrent episodes of this she needs to let us know.

## 2021-02-25 NOTE — Assessment & Plan Note (Signed)
Patient has chronic pain issues related to her back though these complaints of muscle aches seem to be different.  We will check lab work to evaluate for potential causes.

## 2021-02-25 NOTE — Progress Notes (Signed)
Tommi Rumps, MD Phone: [phone]  Crystal Roberson is a 55 y.o. female who presents today for follow-up.  Patient lost to follow-up previously.  Anxiety/depression: Patient notes this has gotten worse recently.  The crying has started back.  She has a lot more anxiety particularly with leaving the house.  She remains on Wellbutrin.  She notes no SI.  She notes no changes in her life that would have triggered this worsening.  She feels panicky sensations frequently.  She has gained weight.  She notes dry mouth and cottonmouth at times and her blood pressure will drop when that occurs.  She reports she has discussed that with her cardiologist.  She was on Prozac at some point in the past and some other medication though notes its hard to remember how she reacted to those at this time.  Alkaline phosphatase elevation: This has been mildly elevated over the years.  Prior work-up was planned with a GGT though it does not appear that she ever had this follow-up lab work completed.  She notes no abdominal pain.  She does note chronic myalgias as well as feeling as though it hurts into her bones at times.  No Tylenol.  No alcohol intake.  Constipation: Patient noted an episode of constipation that was accompanied by moderate abdominal cramping.  During this episode she developed a clammy sweatiness and nausea and felt as though she was going to pass out though did not pass out.  Afterwards she felt drained.  She does note her symptoms improved after having a bowel movement.  She has chronic issues with constipation related to being on narcotics and notes her pain specialist is working on a medication to help with her constipation.  The patient does not currently have any insurance and is not able to work related to a back injury.  Social History   Tobacco Use  Smoking Status Former Smoker  . Packs/day: 0.75  . Years: 30.00  . Pack years: 22.50  . Types: Cigarettes  . Quit date: 09/23/2016  . Years  since quitting: 4.4  Smokeless Tobacco Never Used  .  Current Outpatient Medications on File Prior to Visit  Medication Sig Dispense Refill  . buPROPion (WELLBUTRIN XL) 150 MG 24 hr tablet TAKE 1 TABLET BY MOUTH ONCE DAILY 90 tablet 0  . carvedilol (COREG) 6.25 MG tablet Take 1 tablet (6.25 mg total) by mouth 2 (two) times daily. 180 tablet 1  . clopidogrel (PLAVIX) 75 MG tablet TAKE 1 TABLET BY MOUTH ONCE DAILY 30 tablet 6  . lidocaine (LIDODERM) 5 % Place 2-3 patches onto the skin every 12 (twelve) hours as needed (pain). Remove & Discard patch within 12 hours or as directed by MD    . lisinopril (ZESTRIL) 10 MG tablet Take 1.5 tablets (15 mg total) by mouth 2 (two) times daily. 90 tablet 6  . morphine (MS CONTIN) 15 MG 12 hr tablet Take 15 mg by mouth every 8 (eight) hours.     . nitroGLYCERIN (NITROSTAT) 0.4 MG SL tablet DISSOLVE 1 TABLET UNDER TONGUE AS NEEDEDFOR CHEST PAIN. MAY REPEAT 5 MINUTES APART 3 TIMES IF NEEDED 25 tablet 1  . potassium chloride SA (KLOR-CON) 20 MEQ tablet TAKE 1 TABLET BY MOUTH ONCE DAILY *IF YOU TAKE LASIX TAKE AN EXTRA DOSE ASDIRECTED* 30 tablet 5  . rosuvastatin (CRESTOR) 40 MG tablet Take 1 tablet (40 mg total) by mouth daily. 90 tablet 1  . spironolactone (ALDACTONE) 25 MG tablet TAKE 1/2 TABLET BY  MOUTH TWICE A DAY AS NEEDED 90 tablet 2  . tiZANidine (ZANAFLEX) 4 MG tablet Take 4-8 mg by mouth daily.     No current facility-administered medications on file prior to visit.     ROS see history of present illness  Objective  Physical Exam Vitals:   02/25/21 1530  BP: 118/70  Pulse: 71  Temp: 98.1 F (36.7 C)  SpO2: 95%    BP Readings from Last 3 Encounters:  02/25/21 118/70  02/10/21 140/90  02/19/20 (!) 168/82   Wt Readings from Last 3 Encounters:  02/25/21 174 lb 9.6 oz (79.2 kg)  02/10/21 175 lb 8 oz (79.6 kg)  02/19/20 174 lb (78.9 kg)    Physical Exam Constitutional:      General: She is not in acute distress.    Appearance: She  is not diaphoretic.  Cardiovascular:     Rate and Rhythm: Normal rate and regular rhythm.     Heart sounds: Normal heart sounds.  Pulmonary:     Effort: Pulmonary effort is normal.     Breath sounds: Normal breath sounds.  Skin:    General: Skin is warm and dry.  Neurological:     Mental Status: She is alert.  Psychiatric:     Comments: Mood anxious and depressed, affect flat, intermittently tearful      Assessment/Plan: Please see individual problem list.  Problem List Items Addressed This Visit    Depression   Relevant Medications   sertraline (ZOLOFT) 50 MG tablet   Other Relevant Orders   AMB Referral to [facility]    The patient has had worsening issues with anxiety and depression.  We will start her on Zoloft 50 mg once daily.  She will continue on Wellbutrin 150 mg daily.  Advised to seek medical attention for any suicidal thoughts.  I did discuss seeing a counselor or therapist that she does not have the financial resources to do this at this time.  Discussed referral to chronic care management to discuss financial assistance as well as talking to Education officer, museum.  This referral was placed.  I also discussed the potential to use hydroxyzine to help with her acute anxiety though I am going to make sure that this is okay with her cardiologist.      Relevant Medications   sertraline (ZOLOFT) 50 MG tablet   Other Relevant Orders   AMB Referral to [facility]   Elevated alkaline phosphatase level    Alkaline phosphatase isoenzymes to be checked today to determine if this is coming from her liver or her bones.      Relevant Orders   Alkaline phosphatase, isoenzymes   Myalgia - Primary    Patient has chronic pain issues related to her back though these complaints of muscle aches seem to be different.  We will check lab work to evaluate for potential causes.      Relevant Orders   TSH   Comp Met (CMET)   CK (Creatine Kinase)    Sedimentation rate   Vasovagal episode    The symptoms she had while constipated seem to be related to vasovagal episode.  Discussed that if she has recurrent episodes of this she needs to let us know.      Constipation    She will follow-up with her pain specialist regarding the medicine for her constipation.          Health Maintenance: The patient reports she does not currently have  insurance and would be unable to have health maintenance activities at this time.  Return in about 6 weeks (around 04/08/2021) for Anxiety/depression.  This visit occurred during the SARS-CoV-2 public health emergency.  Safety protocols were in place, including screening questions prior to the visit, additional usage of staff PPE, and extensive cleaning of exam room while observing appropriate contact time as indicated for disinfecting solutions.   I have spent 43 minutes in the care of this patient regarding history taking, documentation, discussion of care management with our clinical pharmacist, placing orders, completion of exam, discussion of plan with patient.   Tommi Rumps, MD [place]

## 2021-02-25 NOTE — Assessment & Plan Note (Signed)
Alkaline phosphatase isoenzymes to be checked today to determine if this is coming from her liver or her bones.

## 2021-02-25 NOTE — Assessment & Plan Note (Addendum)
The patient has had worsening issues with anxiety and depression.  We will start her on Zoloft 50 mg once daily.  She will continue on Wellbutrin 150 mg daily.  Advised to seek medical attention for any suicidal thoughts.  I did discuss seeing a counselor or therapist that she does not have the financial resources to do this at this time.  Discussed referral to chronic care management to discuss financial assistance as well as talking to Child psychotherapist.  This referral was placed.  I also discussed the potential to use hydroxyzine to help with her acute anxiety though I am going to make sure that this is okay with her cardiologist.

## 2021-02-25 NOTE — Assessment & Plan Note (Signed)
She will follow-up with her pain specialist regarding the medicine for her constipation.

## 2021-02-27 MED ORDER — HYDROXYZINE HCL 10 MG PO TABS: 10.0000 mg | ORAL_TABLET | Freq: Three times a day (TID) | ORAL | 0 refills | Status: DC | PRN

## 2021-02-27 NOTE — Progress Notes (Signed)
Response received from Dr End regarding hydroxyzine for this patient. He was ok from the cardiology perspective with trying this. I will send this in for the patient. CMA to call patient to let her know.

## 2021-02-27 NOTE — Addendum Note (Signed)
Addended by: Glori Luis on: 02/27/2021 09:40 AM   Modules accepted: Orders

## 2021-02-28 ENCOUNTER — Telehealth: Payer: Self-pay

## 2021-02-28 LAB — COMPREHENSIVE METABOLIC PANEL
ALT: 18 IU/L (ref 0–32)
AST: 15 IU/L (ref 0–40)
Albumin/Globulin Ratio: 1.7 (ref 1.2–2.2)
Albumin: 4.3 g/dL (ref 3.8–4.9)
Alkaline Phosphatase: 176 IU/L — ABNORMAL HIGH (ref 44–121)
BUN/Creatinine Ratio: 11 (ref 9–23)
BUN: 14 mg/dL (ref 6–24)
Bilirubin Total: 0.6 mg/dL (ref 0.0–1.2)
CO2: 21 mmol/L (ref 20–29)
Calcium: 9.4 mg/dL (ref 8.7–10.2)
Chloride: 104 mmol/L (ref 96–106)
Creatinine, Ser: 1.32 mg/dL — ABNORMAL HIGH (ref 0.57–1.00)
Globulin, Total: 2.5 g/dL (ref 1.5–4.5)
Glucose: 137 mg/dL — ABNORMAL HIGH (ref 65–99)
Potassium: 4.2 mmol/L (ref 3.5–5.2)
Sodium: 140 mmol/L (ref 134–144)
Total Protein: 6.8 g/dL (ref 6.0–8.5)
eGFR: 47 mL/min/{1.73_m2} — ABNORMAL LOW (ref >59)

## 2021-02-28 LAB — ALKALINE PHOSPHATASE, ISOENZYMES
BONE FRACTION: 24 % (ref 14–68)
INTESTINAL FRAC.: 0 % (ref 0–18)
LIVER FRACTION: 76 % (ref 18–85)

## 2021-02-28 LAB — CK: Total CK: 65 U/L (ref 32–182)

## 2021-02-28 LAB — TSH: TSH: 3.47 u[IU]/mL (ref 0.450–4.500)

## 2021-02-28 LAB — SEDIMENTATION RATE: Sed Rate: 12 mm/hr (ref 0–40)

## 2021-02-28 NOTE — Telephone Encounter (Signed)
-----   Message from Glori Luis, MD sent at 02/27/2021  9:40 AM EDT ----- Please let the patient know that I sent in hydroxyzine for her to use for acute anxiety symptoms. Please counsel her that this could make her drowsy and she should not drive while taking it. If it makes her excessively drowsy she should let us know.

## 2021-02-28 NOTE — Telephone Encounter (Signed)
She is to continue on the wellbutrin with the zoloft.

## 2021-02-28 NOTE — Telephone Encounter (Signed)
I called the patient and informed her that the provider sent in Hydroxyzine for her acute anxiety and that it could make her drowsy and she is to not drive while taking it, also if her drowsiness is to excessive to let us know. And she understood.  Maizey Menendez,cma

## 2021-02-28 NOTE — Telephone Encounter (Signed)
I called and spoke with the patient and informed her that the provider does want her to take both Zoloft and Wellbutrin and she understood.  Laith Antonelli,cma

## 2021-02-28 NOTE — Telephone Encounter (Signed)
Pt called in on 02/26/21 to access nurse and asked for medication clarification.

## 2021-03-01 ENCOUNTER — Telehealth: Payer: Self-pay | Admitting: *Deleted

## 2021-03-01 NOTE — Patient Outreach (Signed)
  [facility]) Care Management  [facility] Social Work  03/01/2021  Crystal Roberson 02-Jan-1966 [id number]  Subjective:  Patient referred for community care coordination due to symptoms of Anxiety and Depression  Objective: Patient contacted, initial assessment scheduled for 03/02/21 at 10 am.  Encounter Medications:  Outpatient Encounter Medications as of 03/01/2021  Medication Sig  . buPROPion (WELLBUTRIN XL) 150 MG 24 hr tablet TAKE 1 TABLET BY MOUTH ONCE DAILY  . carvedilol (COREG) 6.25 MG tablet Take 1 tablet (6.25 mg total) by mouth 2 (two) times daily.  . clopidogrel (PLAVIX) 75 MG tablet TAKE 1 TABLET BY MOUTH ONCE DAILY  . hydrOXYzine (ATARAX/VISTARIL) 10 MG tablet Take 1 tablet (10 mg total) by mouth every 8 (eight) hours as needed for anxiety.  . lidocaine (LIDODERM) 5 % Place 2-3 patches onto the skin every 12 (twelve) hours as needed (pain). Remove & Discard patch within 12 hours or as directed by MD  . lisinopril (ZESTRIL) 10 MG tablet Take 1.5 tablets (15 mg total) by mouth 2 (two) times daily.  Marland Kitchen morphine (MS CONTIN) 15 MG 12 hr tablet Take 15 mg by mouth every 8 (eight) hours.   . nitroGLYCERIN (NITROSTAT) 0.4 MG SL tablet DISSOLVE 1 TABLET UNDER TONGUE AS NEEDEDFOR CHEST PAIN. MAY REPEAT 5 MINUTES APART 3 TIMES IF NEEDED  . potassium chloride SA (KLOR-CON) 20 MEQ tablet TAKE 1 TABLET BY MOUTH ONCE DAILY *IF YOU TAKE LASIX TAKE AN EXTRA DOSE ASDIRECTED*  . rosuvastatin (CRESTOR) 40 MG tablet Take 1 tablet (40 mg total) by mouth daily.  . sertraline (ZOLOFT) 50 MG tablet Take 1 tablet (50 mg total) by mouth daily.  Marland Kitchen spironolactone (ALDACTONE) 25 MG tablet TAKE 1/2 TABLET BY MOUTH TWICE A DAY AS NEEDED  . tiZANidine (ZANAFLEX) 4 MG tablet Take 4-8 mg by mouth daily.   No facility-administered encounter medications on file as of 03/01/2021.    Functional Status:  No flowsheet data found.  Fall/Depression Screening:  PHQ 2/9 Scores 02/25/2021 05/24/2016 04/04/2016  03/15/2016 03/02/2016 02/03/2016 12/09/2015  PHQ - 2 Score 0 0 0 0 0 0 0  Exception Documentation - - - - - - (No Data)  Not completed - - - - - - -    Assessment:  Care Plan There are no care plans that you recently modified to display for this patient.   Goals Addressed   None     Plan:  Follow-up:  Initial assessment scheduled for 03/02/21 at 10am

## 2021-03-01 NOTE — Telephone Encounter (Signed)
   Telephone encounter was:  Unsuccessful.  03/01/2021 Name: RANDYE TREICHLER MRN: [id number] DOB: 1966-02-22  Unsuccessful outbound call made today to assist with:  Transportation Needs , Food Insecurity and Home Modifications  Outreach Attempt:  1st Attempt  A HIPAA compliant voice message was left requesting a return call.  Instructed patient to call back at   Instructed patient to call back at [phone]  at their earliest convenience.   Alois Cliche -[facility] Guide , Embedded Care Coordination [facility], Care Management  [phone] [address] [place] , [place] [address] Email : [email or web address]

## 2021-03-02 ENCOUNTER — Telehealth: Payer: Self-pay | Admitting: *Deleted

## 2021-03-02 ENCOUNTER — Telehealth: Payer: Self-pay | Admitting: Family Medicine

## 2021-03-02 MED ORDER — ESCITALOPRAM OXALATE 10 MG PO TABS: 10.0000 mg | ORAL_TABLET | Freq: Every day | ORAL | 3 refills | Status: DC

## 2021-03-02 NOTE — Telephone Encounter (Signed)
I called and spoke with the patient and informed her that she is to stop the Zoloft and the provider sent in lexapro and she understood.  Ashwika Freels,cma

## 2021-03-02 NOTE — Telephone Encounter (Signed)
Patient called and said Dr Birdie Sons prescribed her sertraline (ZOLOFT) 50 MG tablet last week. She does not like the way it is making her feel and she is not eating. She would like another medication.

## 2021-03-02 NOTE — Telephone Encounter (Signed)
Noted.  I sent Lexapro in for her to try instead of the Zoloft.  She can discontinue the Zoloft and start on Lexapro.

## 2021-03-02 NOTE — Patient Outreach (Signed)
[facility]) Care Management  03/02/2021  Crystal Roberson July 01, 1966 [id number]   Referral received for care coordination to address patient's depression and anxiety. Patient increased anxiety and depression following a back injury a couple of years ago. Patient has two adult children and lives with her significant other. Patient confirms that her mother is her main source of emotional support. Patient states that her back injury has affected her ability to work and has been denied disability despite multiple attempts to apply. Patient states that her sleep is unpredictable, has crying spells, her energy level is low and well as her appetite. Patient is currently not in ongoing counseling due to lack of insurance, however ongoing treatment highly recommended.  Medications prescribed, however patient is experiencing side affects. She has contacted her providers office to discuss her options.  Patient did not verbalize any thoughts of harm to herself or others and agreed to follow up with a mental health provider for ongoing treatment. This social worker contacted several mental health agencies that will see patient using [employer] funding due to her lack of insurance. [facility] agreeable to see patient, contact number provided for patient to call to set up the initial appointment. Patient can be seen virtually or in person.[phone]. Emotional support and encouragement provided  Plan: This Child psychotherapist will follow up with patient next week in regards to referral to [facility].

## 2021-03-04 NOTE — Progress Notes (Signed)
Remote ICD transmission.   

## 2021-03-06 ENCOUNTER — Other Ambulatory Visit: Payer: Self-pay | Admitting: Family Medicine

## 2021-03-06 DIAGNOSIS — N179 Acute kidney failure, unspecified: Secondary | ICD-10-CM

## 2021-03-10 ENCOUNTER — Telehealth: Payer: Self-pay | Admitting: *Deleted

## 2021-03-10 NOTE — Patient Outreach (Signed)
[facility]) Care Management  03/10/2021  Crystal Roberson [birth year] [id number]    Care Management   Follow Up Note   03/10/2021 Name: Crystal Roberson MRN: [id number] DOB: [birth year]   Referred by: Glori Luis, MD Reason for referral : Care Coordination   A second unsuccessful telephone outreach was attempted today. The patient was referred to the case management team for assistance with care management and care coordination.   Follow Up Plan:  Third outreach attempt for follow will be attempted within 7-14 business days   Adriana Reams [facility] Care Management [phone]

## 2021-03-16 ENCOUNTER — Telehealth: Payer: Self-pay | Admitting: *Deleted

## 2021-03-16 NOTE — Telephone Encounter (Signed)
  Care Management   Follow Up Note   03/16/2021 Name: Crystal Roberson MRN: [id number] DOB: April 04, 1966   Referred by: Glori Luis, MD Reason for referral : Care Coordination   Third unsuccessful telephone outreach was attempted today. The patient was referred to the case management team for assistance with care management and care coordination. The patient's primary care provider has been notified of our unsuccessful attempts to make or maintain contact with the patient. The care management team is pleased to engage with this patient at any time in the future should he/she be interested in assistance from the care management team.   Follow Up Plan: No further follow up required: care management will be happy to engage patient upon her return call  [employer], LCSW [employer] [phone]

## 2021-03-21 ENCOUNTER — Other Ambulatory Visit: Payer: Self-pay | Admitting: Family Medicine

## 2021-03-30 ENCOUNTER — Telehealth: Payer: Self-pay | Admitting: *Deleted

## 2021-03-30 NOTE — Telephone Encounter (Signed)
   Telephone encounter was:  Unsuccessful.  03/30/2021 Name: KAMILLE TOOMEY MRN: [id number] DOB: 04/06/1966  Unsuccessful outbound call made today to assist with:  Transportation Needs  and Food Insecurity  Outreach Attempt:  2nd Attempt  Left message   Alois Cliche -[facility] Guide , Embedded Care Coordination [facility], Care Management  [phone] [address] [place] , [place] [address] Email : [email or web address]

## 2021-04-01 ENCOUNTER — Telehealth: Payer: Self-pay | Admitting: *Deleted

## 2021-04-01 NOTE — Telephone Encounter (Signed)
   Telephone encounter was:  Unsuccessful.  04/01/2021 Name: Crystal Roberson MRN: [id number] DOB: [birth year]  Unsuccessful outbound call made today to assist with:  Transportation Needs  and Food Insecurity  Outreach Attempt:  3rd Attempt.  Referral closed unable to contact patient.  Patient has been unsuccessfully out reached 3x and I will be happy to have further outreach if patient desires access to community resources in the future   Dene Nazir Greenauer -[facility] Guide , Embedded Care Coordination [facility], Care Management  [phone] [address] [place] , [place] [address] Email : [email or web address]

## 2021-04-11 ENCOUNTER — Telehealth: Payer: Self-pay | Admitting: Family Medicine

## 2021-04-11 NOTE — Telephone Encounter (Signed)
I called the patient and she stated she is taking Potassium, Crestor, Plavix, Carvedilol, Spironolactone, Lisinopril, Carvedilol, Lexapro, Morphine, tizanidine, and hydroxyzine as needed.  Patient stated she is really lightheaded, has not had a syncope episode, she states she sits down once she fells it come on.  Crystal Roberson,cma

## 2021-04-11 NOTE — Telephone Encounter (Signed)
Noted.  Crestor should not be dropping her blood pressure.  Please confirm her medication list.  Please see what her blood pressure has been running.  Has she been getting lightheaded?  Has she had any syncopal episodes?

## 2021-04-11 NOTE — Telephone Encounter (Signed)
PT called to advise that ever since one of her meds were change to Crestor her BP has been dropping for afterwards. She states that for a few days she was fine but here recently she has been noticing it and would like some advise. PT states nothing else in regards to symptoms.

## 2021-04-13 ENCOUNTER — Telehealth: Payer: Self-pay | Admitting: Internal Medicine

## 2021-04-13 NOTE — Telephone Encounter (Signed)
Noted.  Please see if she has checked her blood pressure.  She could try decreasing the lisinopril to 10 mg twice daily and see if that helps particularly if her blood pressure has been running on the low side.  I may have other recommendations depending on what her blood pressure has been.

## 2021-04-13 NOTE — Telephone Encounter (Signed)
Pt c/o BP issue: STAT if pt c/o blurred vision, one-sided weakness or slurred speech  1. What are your last 5 BP readings? 60/40, 99/63, 123/74 (before medications)  2. Are you having any other symptoms (ex. Dizziness, headache, blurred vision, passed out)? Feels like Crystal Roberson is going to pass out at times, dizzy  3. What is your BP issue? Dropping after patient takes her medication. Patient had recent medication changes, added crestor

## 2021-04-14 NOTE — Telephone Encounter (Signed)
LVM to call back.  Lynzy Rawles,cma  

## 2021-04-14 NOTE — Telephone Encounter (Signed)
Patient called back and stated that she spoke with Dr. Okey Dupre, he was the one who put her on the medication and they have a plan in place.  Kooper Chriswell,cma

## 2021-04-14 NOTE — Telephone Encounter (Signed)
Noted  

## 2021-04-14 NOTE — Telephone Encounter (Signed)
Spoke to pt. Over past 7 days pt reports "BP suddenly lower than usual." Numbers listed below. Had pt take BP now while on phone: 133/72 HR 74.  Pt does state she has not been drinking adequate fluid intake when assessed.  Last ov with Dr. Okey Dupre 02/10/21. BP's always high at visits. Last ov BP 140/90.  Pt has changed from Zoloft to Lexapro in last month. Has not taken Hydroxyzine recently per pt.  Dr. Okey Dupre made aware.  Advised pt to make sure she is staying well hydrated and to continue current medications and call our office if her BP is consistently low.  Pt voiced understanding.  No further questions/concerns at this time.

## 2021-05-12 ENCOUNTER — Ambulatory Visit (INDEPENDENT_AMBULATORY_CARE_PROVIDER_SITE_OTHER): Payer: Self-pay

## 2021-05-12 DIAGNOSIS — I255 Ischemic cardiomyopathy: Secondary | ICD-10-CM

## 2021-05-16 LAB — CUP PACEART REMOTE DEVICE CHECK
Battery Remaining Longevity: 72 mo
Battery Remaining Percentage: 69 %
Battery Voltage: 2.96 V
Brady Statistic RV Percent Paced: 1 %
Date Time Interrogation Session: 20220821234118
HighPow Impedance: 86 Ohm
HighPow Impedance: 86 Ohm
Implantable Lead Implant Date: 20180906
Implantable Lead Location: 753860
Implantable Pulse Generator Implant Date: 20180906
Lead Channel Impedance Value: 390 Ohm
Lead Channel Pacing Threshold Amplitude: 1 V
Lead Channel Pacing Threshold Pulse Width: 0.5 ms
Lead Channel Sensing Intrinsic Amplitude: 12 mV
Lead Channel Setting Pacing Amplitude: 2.5 V
Lead Channel Setting Pacing Pulse Width: 0.5 ms
Lead Channel Setting Sensing Sensitivity: 0.5 mV
Pulse Gen Serial Number: 9783396

## 2021-05-31 ENCOUNTER — Telehealth: Payer: Self-pay | Admitting: Internal Medicine

## 2021-05-31 ENCOUNTER — Other Ambulatory Visit: Payer: Self-pay | Admitting: *Deleted

## 2021-05-31 DIAGNOSIS — E785 Hyperlipidemia, unspecified: Secondary | ICD-10-CM

## 2021-05-31 DIAGNOSIS — Z79899 Other long term (current) drug therapy: Secondary | ICD-10-CM

## 2021-05-31 NOTE — Telephone Encounter (Signed)
LVM to schedule appt

## 2021-05-31 NOTE — Progress Notes (Signed)
Remote ICD transmission.   

## 2021-05-31 NOTE — Telephone Encounter (Signed)
-----   Message from Jefferey Pica, RN sent at 05/31/2021  2:55 PM EDT ----- This patient is due to come in for Dr. Okey Dupre to have fasting lab work to recheck her CMET/ Lipid panel. I had discussed this with her back in May.  Do you mind please calling her to schedule a FASTING lab appt- orders are in.   No food/ drink for 8 hours prior except medications are fine with water/ black coffee.  Thank you!  ----- Message ----- From: Jefferey Pica, RN Sent: 05/19/2021   5:30 PM EDT To: Jefferey Pica, RN  Patient needs repeat CMET/ Lipid in 3 months (late August) See 5/20 labs

## 2021-06-02 ENCOUNTER — Other Ambulatory Visit: Payer: Self-pay | Admitting: Family Medicine

## 2021-06-06 NOTE — Telephone Encounter (Signed)
Attempted to schedule.  LMOV to call office.  ° °

## 2021-06-13 NOTE — Telephone Encounter (Signed)
UNABLE TO REACH MAILING RECALL LETTER AND CLOSING ENCOUNTER.

## 2021-06-13 NOTE — Telephone Encounter (Signed)
Attempted to schedule.  LMOV to call office.  ° °

## 2021-07-04 ENCOUNTER — Other Ambulatory Visit: Payer: Self-pay | Admitting: Family Medicine

## 2021-08-03 ENCOUNTER — Other Ambulatory Visit: Payer: Self-pay | Admitting: Internal Medicine

## 2021-08-11 ENCOUNTER — Ambulatory Visit (INDEPENDENT_AMBULATORY_CARE_PROVIDER_SITE_OTHER): Payer: Self-pay

## 2021-08-11 DIAGNOSIS — I255 Ischemic cardiomyopathy: Secondary | ICD-10-CM

## 2021-08-12 ENCOUNTER — Other Ambulatory Visit: Payer: Self-pay | Admitting: Internal Medicine

## 2021-08-12 NOTE — Telephone Encounter (Signed)
Please contact pt for future appointment. Pt due for 6 month f/u. 

## 2021-08-16 LAB — CUP PACEART REMOTE DEVICE CHECK
Battery Remaining Longevity: 70 mo
Battery Remaining Percentage: 67 %
Battery Voltage: 2.98 V
Brady Statistic RV Percent Paced: 1 %
Date Time Interrogation Session: 20221121211228
HighPow Impedance: 80 Ohm
HighPow Impedance: 80 Ohm
Implantable Lead Implant Date: 20180906
Implantable Lead Location: 753860
Implantable Pulse Generator Implant Date: 20180906
Lead Channel Impedance Value: 430 Ohm
Lead Channel Pacing Threshold Amplitude: 1 V
Lead Channel Pacing Threshold Pulse Width: 0.5 ms
Lead Channel Sensing Intrinsic Amplitude: 12 mV
Lead Channel Setting Pacing Amplitude: 2.5 V
Lead Channel Setting Pacing Pulse Width: 0.5 ms
Lead Channel Setting Sensing Sensitivity: 0.5 mV
Pulse Gen Serial Number: 9783396

## 2021-08-22 NOTE — Progress Notes (Signed)
Remote ICD transmission.   

## 2021-08-26 ENCOUNTER — Telehealth: Payer: Self-pay | Admitting: Pharmacist

## 2021-08-26 MED ORDER — ROSUVASTATIN CALCIUM 40 MG PO TABS
40.0000 mg | ORAL_TABLET | Freq: Every day | ORAL | 1 refills | Status: DC
  Filled 2021-08-26: qty 30, 30d supply, fill #0
  Filled 2021-10-05: qty 30, 30d supply, fill #1
  Filled 2021-11-22: qty 30, 30d supply, fill #2
  Filled 2022-01-02: qty 30, 30d supply, fill #3
  Filled 2022-02-01: qty 30, 30d supply, fill #4
  Filled 2022-03-05: qty 30, 30d supply, fill #5
  Filled 2022-03-06: qty 30, 30d supply, fill #0

## 2021-08-26 MED ORDER — LISINOPRIL 10 MG PO TABS
15.0000 mg | ORAL_TABLET | Freq: Two times a day (BID) | ORAL | 1 refills | Status: DC
  Filled 2021-08-26: qty 90, 30d supply, fill #0

## 2021-08-26 MED ORDER — SPIRONOLACTONE 25 MG PO TABS
12.5000 mg | ORAL_TABLET | Freq: Two times a day (BID) | ORAL | 0 refills | Status: DC | PRN
  Filled 2021-08-26: qty 30, 30d supply, fill #0
  Filled 2021-10-05: qty 30, 30d supply, fill #1
  Filled 2021-11-22: qty 30, 30d supply, fill #2

## 2021-08-26 MED ORDER — CLOPIDOGREL BISULFATE 75 MG PO TABS
75.0000 mg | ORAL_TABLET | Freq: Every day | ORAL | 1 refills | Status: DC
  Filled 2021-08-26: qty 30, 30d supply, fill #0
  Filled 2021-10-05: qty 30, 30d supply, fill #1
  Filled 2021-11-22: qty 30, 30d supply, fill #2
  Filled 2022-01-02: qty 30, 30d supply, fill #3
  Filled 2022-02-01: qty 30, 30d supply, fill #4
  Filled 2022-03-05: qty 30, 30d supply, fill #5
  Filled 2022-03-06: qty 30, 30d supply, fill #0

## 2021-08-26 MED ORDER — POTASSIUM CHLORIDE CRYS ER 20 MEQ PO TBCR
EXTENDED_RELEASE_TABLET | ORAL | 1 refills | Status: DC
Start: 2021-08-26 — End: 2022-04-01
  Filled 2021-08-26: qty 30, 30d supply, fill #0
  Filled 2021-10-05: qty 30, 30d supply, fill #1
  Filled 2021-11-22: qty 30, 30d supply, fill #2
  Filled 2022-01-02: qty 30, 30d supply, fill #3
  Filled 2022-02-01: qty 30, 30d supply, fill #4
  Filled 2022-03-05: qty 30, 30d supply, fill #5
  Filled 2022-03-06: qty 30, 30d supply, fill #0

## 2021-08-26 MED ORDER — CARVEDILOL 6.25 MG PO TABS
6.2500 mg | ORAL_TABLET | Freq: Two times a day (BID) | ORAL | 1 refills | Status: DC
  Filled 2021-08-26: qty 60, 30d supply, fill #0
  Filled 2021-10-05: qty 60, 30d supply, fill #1
  Filled 2021-11-22: qty 60, 30d supply, fill #2
  Filled 2022-01-02: qty 60, 30d supply, fill #3
  Filled 2022-02-01: qty 60, 30d supply, fill #4
  Filled 2022-03-05: qty 60, 30d supply, fill #5
  Filled 2022-03-06: qty 60, 30d supply, fill #0

## 2021-08-26 NOTE — Telephone Encounter (Signed)
Refills have been sent in to Medication [facility] for: carvedilol, clopidogrel, lisinopril, potassium, rosuvastatin, and spironolactone.

## 2021-08-26 NOTE — Addendum Note (Signed)
Addended by: Lanny Hurst on: 08/26/2021 04:41 PM   Modules accepted: Orders

## 2021-08-26 NOTE — Telephone Encounter (Signed)
I was on a call with patient's significant other and discussed [facility] pharmacy. Patient asked if her medications could be transferred as well.   Patient is overdue for follow up with PCP. I scheduled for next available on 11/23/21.   Dr. Birdie Sons - if amenable, could you send bupropion, escitalopram, and hydroxyzine to [facility]?   Dr. Perlie Gold - patient has an appointment with [facility] on 10/03/21 - if amenable, could you send prescriptions for carvedilol, clopidogrel, lisinopril, potassium, rosuvastatin, and spironolactone to [facility]?

## 2021-08-29 ENCOUNTER — Other Ambulatory Visit: Payer: Self-pay

## 2021-08-30 ENCOUNTER — Other Ambulatory Visit: Payer: Self-pay

## 2021-08-30 MED ORDER — ESCITALOPRAM OXALATE 10 MG PO TABS
10.0000 mg | ORAL_TABLET | Freq: Every day | ORAL | 0 refills | Status: DC
  Filled 2021-08-30 – 2021-10-05 (×2): qty 30, 30d supply, fill #0
  Filled 2021-11-22: qty 30, 30d supply, fill #1
  Filled 2022-01-02: qty 30, 30d supply, fill #2

## 2021-08-30 MED ORDER — BUPROPION HCL ER (XL) 150 MG PO TB24
150.0000 mg | ORAL_TABLET | Freq: Every day | ORAL | 0 refills | Status: DC
  Filled 2021-08-30: qty 30, 30d supply, fill #0

## 2021-08-30 MED ORDER — HYDROXYZINE HCL 10 MG PO TABS
10.0000 mg | ORAL_TABLET | Freq: Three times a day (TID) | ORAL | 0 refills | Status: DC | PRN
  Filled 2021-08-30: qty 30, 10d supply, fill #0

## 2021-08-30 NOTE — Addendum Note (Signed)
Addended by: Birdie Sons Sammy Cassar G on: 08/30/2021 12:27 PM   Modules accepted: Orders

## 2021-08-30 NOTE — Telephone Encounter (Signed)
Medication sent to medication management clinic.

## 2021-09-07 ENCOUNTER — Telehealth: Payer: Self-pay | Admitting: Pharmacy Technician

## 2021-09-07 NOTE — Telephone Encounter (Signed)
Provided patient with new patient packet to obtain [facility] services.  [facility] must receive requested financial documentation in order to determine eligibility and provide medication assistance.  Sherilyn Dacosta Care Manager [facility]

## 2021-09-30 ENCOUNTER — Other Ambulatory Visit: Payer: Self-pay

## 2021-09-30 ENCOUNTER — Other Ambulatory Visit: Payer: Self-pay | Admitting: Family Medicine

## 2021-10-02 NOTE — Progress Notes (Signed)
Office Visit    Patient Name: Crystal Roberson Date of Encounter: 10/03/2021  PCP:  Glori LuisSonnenberg, Eric G, MD   [facility]  Cardiologist:  Yvonne Kendallhristopher End, MD  Advanced Practice Provider:  No care team member to display Electrophysiologist:  None    HPI:     Crystal Roberson is a 56 y.o. female with a hx of coronary artery disease status post anterior STEMI in 08/2016, chronic systolic heart failure due to ischemic cardiomyopathy status post ICD (05/2016), hypertension with suspicion for hyperaldosteronism, migraine headaches, degenerative disc disease with chronic back pain, and depression who presents today for 4052-month follow-up of her coronary artery disease and chronic HFrEF.  She was last seen by Dr. Okey DupreEnd May 2022.  Previously she was seen for exertional dyspnea.  Her activity was limited to her chronic back pain.  At times her blood pressure readings were up to 200 mmHg systolic.  Her carvedilol was increased to 6.25 mg twice daily.  Her dose of lisinopril and spironolactone were continued with plans to obtain a CMP (this was never completed).  At the time of her last visit she reported feeling about the same.  She noted dull random chest pain "every now and then" that happens about once every 3 months.  She had no associated symptoms.  Her chronic exertional dyspnea was unchanged.  Her chronic back pain and depression continue to limit her activity level.  She was having episodic periods of lightheadedness and associated labile blood pressure.  She denied falling or syncope.  She denied palpitations, orthopnea, PND, and edema.  No ICD shocks.  Today, she has been experiencing moments where she gets a dry mouth, dizziness, and then her BP drops suddenly. It was happening every few months but has happened three times in the past month. We did orthostatics in the office which were marginally positive from sitting to standing. HR remained stable. She does not drink much water  throughout the day because she doesn't like the taste. She was encouraged to increase her fluid intake by one water bottle a day and decrease her tea intake. She states this feeling for dizziness and her BP dropping is not new and has been going on for years. We also discussed starting Zetia for her LDL and repeating a lipid panel and CMP in 6-8 weeks. She is encouraged to take her BP at home and if she continues to have symptoms she is encouraged to write everything down.   Reports no shortness of breath nor dyspnea on exertion. Reports no chest pain, pressure, or tightness. No edema, orthopnea, PND. Reports no palpitations.    Past Medical History    Past Medical History:  Diagnosis Date   Allergy    CAD (coronary artery disease)    a. 08/2016 Ant STEMI/PCI: LAD 5170m (2.25x20 Promus Premier DES); b. 04/2017 Cath: LM nl, LAD 20ost, 70ost->vasospasm, Patent mid stent, 50d, D1/2/3 small, LCX 20ost, 8442m, OM1/2/3 small, RCA 6147m, 20d, EF 30-35%, mild to mod MR.   Chicken pox    Chronic back pain    Chronic fatigue    DDD (degenerative disc disease), lumbar    Depression    HFrEF (heart failure with reduced ejection fraction) (HCC)    a. 01/2017 Echo: Ef 35-40%, septal apical, distal inf, anterior HK. Mod dil LV. Mild to mod MR. mildly dil LA; b. 04/2017 LV Gram: EF 30-35%.   Hyperlipidemia LDL goal <70    Hypertension    Ischemic  cardiomyopathy    a. 01/2017 Echo: EF 35-40%; b. 04/2017 LV Gram: EF 30-35%; c. 05/2017 s/p SJM 1411-36Q Ellipse VR RV only AICD.   Migraines    Syncope 09/12/2016   Past Surgical History:  Procedure Laterality Date   CARDIAC CATHETERIZATION N/A 09/23/2016   Procedure: Left Heart Cath and Coronary Angiography;  Surgeon: Yvonne Kendall, MD;  Location: [facility] INVASIVE CV LAB;  Service: Cardiovascular;  Laterality: N/A;   CARDIAC CATHETERIZATION N/A 09/23/2016   Procedure: Coronary Stent Intervention;  Surgeon: Yvonne Kendall, MD;  Location: [facility] INVASIVE CV LAB;  Service:  Cardiovascular;  Laterality: N/A;   CERVICAL DISC SURGERY     ICD IMPLANT N/A 05/31/2017   Procedure: ICD Implant;  Surgeon: Regan Lemming, MD;  Location: [facility] INVASIVE CV LAB;  Service: Cardiovascular;  Laterality: N/A;   RIGHT/LEFT HEART CATH AND CORONARY ANGIOGRAPHY N/A 04/26/2017   Procedure: Right/Left Heart Cath and Coronary Angiography;  Surgeon: Yvonne Kendall, MD;  Location: [facility] INVASIVE CV LAB;  Service: Cardiovascular;  Laterality: N/A;   TUBAL LIGATION     ULTRASOUND GUIDANCE FOR VASCULAR ACCESS  04/26/2017   Procedure: Ultrasound Guidance For Vascular Access;  Surgeon: Yvonne Kendall, MD;  Location: [facility] INVASIVE CV LAB;  Service: Cardiovascular;;    Allergies  Allergies  Allergen Reactions   Pregabalin Shortness Of Breath, Swelling, Palpitations and Other (See Comments)    Other reaction(s): Tachycardia (finding)   Codeine Nausea And Vomiting     EKGs/Labs/Other Studies Reviewed:   The following studies were reviewed today:  08/16/2021 Normal remote reviewed. Normal device function.  Echocardiogram 02/10/2019  IMPRESSIONS     1. There is akinesis of the left ventricular, mid-apical anterior wall,  apical segment and anteroseptal wall.   2. The left ventricle has mild-moderately reduced systolic function, with  an ejection fraction of 40-45%. The cavity size was normal. Left  ventricular diastolic Doppler parameters are consistent with impaired  relaxation.   3. The right ventricle has normal systolic function. The cavity was  normal. There is no increase in right ventricular wall thickness. Right  ventricular systolic pressure could not be assessed.   4. The aortic valve was not well visualized.   5. The interatrial septum was not well visualized.   FINDINGS   Left Ventricle: The left ventricle has mild-moderately reduced systolic  function, with an ejection fraction of 40-45%. The cavity size was normal.  There is no increase in left ventricular wall  thickness. Left ventricular  diastolic Doppler parameters are   consistent with impaired relaxation. There is akinesis of the left  ventricular, mid-apical anterior wall, apical segment and anteroseptal  wall.   Right Ventricle: The right ventricle has normal systolic function. The  cavity was normal. There is no increase in right ventricular wall  thickness. Right ventricular systolic pressure could not be assessed.  Pacing wire/catheter visualized in the right  ventricle.   Left Atrium: Left atrial size was normal in size.   Right Atrium: Right atrial size was normal in size.   Interatrial Septum: The interatrial septum was not well visualized.   Pericardium: The pericardium was not well visualized.   Mitral Valve: The mitral valve is normal in structure. Mitral valve  regurgitation is mild by color flow Doppler.   Tricuspid Valve: The tricuspid valve is not well visualized. Tricuspid  valve regurgitation is trivial by color flow Doppler.   Aortic Valve: The aortic valve was not well visualized Aortic valve  regurgitation was not visualized by color flow  Doppler. There is no  evidence of aortic valve stenosis.   Pulmonic Valve: The pulmonic valve was not well visualized. Pulmonic valve  regurgitation is not visualized by color flow Doppler. No evidence of  pulmonic stenosis.   Pulmonary Artery: The pulmonary artery is not well seen.    Limited TTE (01/23/17): Dilated left ventricle with normal wall thickness. LVEF 35-40% with apical anterior, apical septal, and apical inferior hypokinesis. Mild to moderate MR. Mild left atrial enlargement. Normal RV size and function.  Transthoracic echocardiogram (11/06/16): LVEF 30-35% with severe hypokinesis of mid/apical septal, anterior, and lateral walls, as well as the apex. Moderate to severe MR. Mild left atrial enlargment. Moderate pulmonary hypertension.  Transthoracic echocardiogram (09/13/16): Normal LV size and contraction (EF  60-65%). Normal diastolic function. Trivial mitral and tricuspid regurgitation. Normal RV size and function.    EKG:  EKG is not ordered today.    Recent Labs: 02/10/2021: Hemoglobin 14.1; Platelets 203 02/25/2021: ALT 18; BUN 14; Creatinine, Ser 1.32; Potassium 4.2; Sodium 140; TSH 3.470  Recent Lipid Panel    Component Value Date/Time   CHOL 251 (H) 02/10/2021 1030   CHOL 152 10/09/2018 0935   TRIG 168 (H) 02/10/2021 1030   HDL 64 02/10/2021 1030   HDL 61 10/09/2018 0935   CHOLHDL 3.9 02/10/2021 1030   VLDL 34 02/10/2021 1030   LDLCALC 153 (H) 02/10/2021 1030   LDLCALC 72 10/09/2018 0935    Home Medications   Current Meds  Medication Sig   buPROPion (WELLBUTRIN XL) 150 MG 24 hr tablet TAKE 1 TABLET BY MOUTH ONCE DAILY   carvedilol (COREG) 6.25 MG tablet Take 1 tablet (6.25 mg total) by mouth 2 (two) times daily.   clopidogrel (PLAVIX) 75 MG tablet Take 1 tablet (75 mg total) by mouth once daily.   escitalopram (LEXAPRO) 10 MG tablet Take 1 tablet (10 mg total) by mouth once daily.   ezetimibe (ZETIA) 10 MG tablet Take 1 tablet (10 mg total) by mouth once daily.   hydrOXYzine (ATARAX) 10 MG tablet Take 1 tablet (10 mg total) by mouth every 8 (eight) hours as needed for anxiety.   lidocaine (LIDODERM) 5 % Place 2-3 patches onto the skin every 12 (twelve) hours as needed (pain). Remove & Discard patch within 12 hours or as directed by MD   morphine (MS CONTIN) 15 MG 12 hr tablet Take 15 mg by mouth every 8 (eight) hours.    nitroGLYCERIN (NITROSTAT) 0.4 MG SL tablet DISSOLVE 1 TABLET UNDER TONGUE AS NEEDEDFOR CHEST PAIN. MAY REPEAT 5 MINUTES APART 3 TIMES IF NEEDED   potassium chloride SA (KLOR-CON M) 20 MEQ tablet TAKE 1 TABLET BY MOUTH ONCE DAILY. (IF YOU TAKE LASIX TAKE AN EXTRA DOSE AS DIRECTED)   rosuvastatin (CRESTOR) 40 MG tablet Take 1 tablet (40 mg total) by mouth once daily.   spironolactone (ALDACTONE) 25 MG tablet Take (1/2) tablet (12.5 mg total) by mouth 2 (two) times  daily as needed.   tiZANidine (ZANAFLEX) 4 MG tablet Take 4-8 mg by mouth daily.   [DISCONTINUED] lisinopril (ZESTRIL) 10 MG tablet Take 1 and (1/2) tablets (15 mg total) by mouth 2 (two) times daily.     Review of Systems      All other systems reviewed and are otherwise negative except as noted above.  Physical Exam    VS:  BP 98/64 (BP Location: Left Arm, Patient Position: Sitting, Cuff Size: Normal)    Pulse 68    Ht 5\' 4"  (1.626  m)    Wt 172 lb (78 kg)    SpO2 94%    BMI 29.52 kg/m  , BMI Body mass index is 29.52 kg/m.  Wt Readings from Last 3 Encounters:  10/03/21 172 lb (78 kg)  02/25/21 174 lb 9.6 oz (79.2 kg)  02/10/21 175 lb 8 oz (79.6 kg)     GEN: Well nourished, well developed, in no acute distress. HEENT: normal. Neck: Supple, no JVD, carotid bruits, or masses. Cardiac: RRR, no murmurs, rubs, or gallops. No clubbing, cyanosis, edema.  Radials/PT 2+ and equal bilaterally.  Respiratory:  Respirations regular and unlabored, clear to auscultation bilaterally. GI: Soft, nontender, nondistended. MS: No deformity or atrophy. Skin: Warm and dry, no rash. Neuro:  Strength and sensation are intact. Psych: Normal affect.  Assessment & Plan    CAD s/p PCI to LAD -Decided to discontinue aspirin and maintain indefinite clopidogrel therapy in the setting of prior MI and LAD PCI per Dr. Okey DupreEnd -No current angina -We will defer further ischemic work-up at this time -Continue GDMT: Plavix, Coreg, lisinopril, nitroglycerin as needed, and Crestor  Chronic HFrEF -Stable NYHA class II symptoms -Continue carvedilol 6.25 mg twice daily, spironolactone 25 mg daily, and lisinopril 15 mg -Does not appear fluid overloaded on exam today -Continue daily weights and if you gain more than 2-3 lbs overnight or 5 lbs in a week, please call our office.   Labile hypertension -Stopped lisinopril today -BP low today in the clinic -Encouraged to check her blood pressure at home -Continue  low-sodium, heart healthy diet -No recent BMP, will order today  Hyperlipidemia -Recent lipid panel shows cholesterol 251, LDL 153, HDL 64, and triglycerides 161168 -Goal LDL less than 70,  triglycerides less than 150 -Crestor 40 mg recently increased which is now max therapy -We will plan to repeat another lipid panel and add Zetia to her regimen -If triglycerides remain elevated will consider Vascepa   Disposition: Follow up 6-8 weeks with Yvonne Kendallhristopher End, MD or APP.  Signed, Sharlene Doryessa N Tracey Hermance, PA-C 10/03/2021, 4:34 PM [facility]

## 2021-10-03 ENCOUNTER — Ambulatory Visit (INDEPENDENT_AMBULATORY_CARE_PROVIDER_SITE_OTHER): Payer: Self-pay | Admitting: Physician Assistant

## 2021-10-03 ENCOUNTER — Other Ambulatory Visit: Payer: Self-pay

## 2021-10-03 ENCOUNTER — Encounter: Payer: Self-pay | Admitting: Medical

## 2021-10-03 VITALS — BP 98/64 | HR 68 | Ht 64.0 in | Wt 172.0 lb

## 2021-10-03 DIAGNOSIS — I10 Essential (primary) hypertension: Secondary | ICD-10-CM

## 2021-10-03 DIAGNOSIS — E785 Hyperlipidemia, unspecified: Secondary | ICD-10-CM

## 2021-10-03 DIAGNOSIS — I2510 Atherosclerotic heart disease of native coronary artery without angina pectoris: Secondary | ICD-10-CM

## 2021-10-03 DIAGNOSIS — I5022 Chronic systolic (congestive) heart failure: Secondary | ICD-10-CM

## 2021-10-03 DIAGNOSIS — R0989 Other specified symptoms and signs involving the circulatory and respiratory systems: Secondary | ICD-10-CM

## 2021-10-03 MED ORDER — EZETIMIBE 10 MG PO TABS
10.0000 mg | ORAL_TABLET | Freq: Every day | ORAL | 3 refills | Status: DC
  Filled 2021-10-03: qty 90, 90d supply, fill #0
  Filled 2021-10-28: qty 30, 30d supply, fill #0
  Filled 2021-11-22: qty 30, 30d supply, fill #1
  Filled 2022-01-02: qty 30, 30d supply, fill #2
  Filled 2022-02-01: qty 30, 30d supply, fill #3
  Filled 2022-03-05: qty 30, 30d supply, fill #4
  Filled 2022-03-06: qty 30, 30d supply, fill #0
  Filled 2022-04-01: qty 30, 30d supply, fill #1
  Filled 2022-05-17 – 2022-06-02 (×2): qty 30, 30d supply, fill #2
  Filled 2022-07-25: qty 30, 30d supply, fill #3
  Filled 2022-08-20: qty 30, 30d supply, fill #4

## 2021-10-03 NOTE — Patient Instructions (Signed)
Medication Instructions:  Your physician has recommended you make the following change in your medication:   STOP taking lisinopril (Zestril)   START taking ezetimibe (Zetia) 10 mg daily   *If you need a refill on your cardiac medications before your next appointment, please call your pharmacy*   Lab Work:  Your physician recommends that you return for a FASTING lipid profile: The week prior to your 6 week follow up.  - You will need to be fasting. Please do not have anything to eat or drink after midnight the morning you have the lab work. You may only have water or black coffee with no cream or sugar.   We will call you closer to your appointment time and schedule you in our office for this lab draw.   If you have labs (blood work) drawn today and your tests are completely normal, you will receive your results only by: MyChart Message (if you have MyChart) OR A paper copy in the mail If you have any lab test that is abnormal or we need to change your treatment, we will call you to review the results.   Testing/Procedures: None ordered   Follow-Up: At [facility], you and your health needs are our priority.  As part of our continuing mission to provide you with exceptional heart care, we have created designated Provider Care Teams.  These Care Teams include your primary Cardiologist (physician) and Advanced Practice Providers (APPs -  Physician Assistants and Nurse Practitioners) who all work together to provide you with the care you need, when you need it.  We recommend signing up for the patient portal called "MyChart".  Sign up information is provided on this After Visit Summary.  MyChart is used to connect with patients for Virtual Visits (Telemedicine).  Patients are able to view lab/test results, encounter notes, upcoming appointments, etc.  Non-urgent messages can be sent to your provider as well.   To learn more about what you can do with MyChart, go to  [email or web address].    Your next appointment:   6-8 week(s)  The format for your next appointment:   In Person  Provider:   You may see Yvonne Kendall, MD or one of the following Advanced Practice Providers on your designated Care Team:   Nicolasa Ducking, NP Eula Listen, PA-C Cadence Fransico Michael, PA-C :1}    Other Instructions:  Increase water intake by 16 ounces daily  Decrease caffeine intake  Fat and Cholesterol Restricted Eating Plan Getting too much fat and cholesterol in your diet may cause health problems. Choosing the right foods helps keep your fat and cholesterol at normal levels. This can keep you from getting certain diseases. What are tips for following this plan? Meal planning At meals, divide your plate into four equal parts: Fill one-half of your plate with vegetables and green salads. Fill one-fourth of your plate with whole grains. Fill one-fourth of your plate with low-fat (lean) protein foods. Eat fish that is high in omega-3 fats at least two times a week. This includes mackerel, tuna, sardines, and salmon. Eat foods that are high in fiber, such as whole grains, beans, apples, broccoli, carrots, peas, and barley. General tips  Work with your doctor to lose weight if you need to. Avoid: Foods with added sugar. Fried foods. Foods with partially hydrogenated oils. Limit alcohol intake to no more than 1 drink a day for nonpregnant women and 2 drinks a day for men. One drink equals 12 oz of beer, 5 oz  of wine, or 1 oz of hard liquor. Reading food labels Check food labels for: Trans fats. Partially hydrogenated oils. Saturated fat (g) in each serving. Cholesterol (mg) in each serving. Fiber (g) in each serving. Choose foods with healthy fats, such as: Monounsaturated fats. Polyunsaturated fats. Omega-3 fats. Choose grain products that have whole grains. Look for the word "whole" as the first word in the ingredient list. Cooking Cook foods using  low-fat methods. These include baking, boiling, grilling, and broiling. Eat more home-cooked foods. Eat at restaurants and buffets less often. Avoid cooking using saturated fats, such as butter, cream, palm oil, palm kernel oil, and coconut oil. Recommended foods  Fruits All fresh, canned (in natural juice), or frozen fruits. Vegetables Fresh or frozen vegetables (raw, steamed, roasted, or grilled). Green salads. Grains Whole grains, such as whole wheat or whole grain breads, crackers, cereals, and pasta. Unsweetened oatmeal, bulgur, barley, quinoa, or brown rice. Corn or whole wheat flour tortillas. Meats and other protein foods Ground beef (85% or leaner), grass-fed beef, or beef trimmed of fat. Skinless chicken or [place]. Ground chicken or [place]. Pork trimmed of fat. All fish and seafood. Egg whites. Dried beans, peas, or lentils. Unsalted nuts or seeds. Unsalted canned beans. Nut butters without added sugar or oil. Dairy Low-fat or nonfat dairy products, such as skim or 1% milk, 2% or reduced-fat cheeses, low-fat and fat-free ricotta or cottage cheese, or plain low-fat and nonfat yogurt. Fats and oils Tub margarine without trans fats. Light or reduced-fat mayonnaise and salad dressings. Avocado. Olive, canola, sesame, or safflower oils. The items listed above may not be a complete list of foods and beverages you can eat. Contact a dietitian for more information. Foods to avoid Fruits Canned fruit in heavy syrup. Fruit in cream or butter sauce. Fried fruit. Vegetables Vegetables cooked in cheese, cream, or butter sauce. Fried vegetables. Grains White bread. White pasta. White rice. Cornbread. Bagels, pastries, and croissants. Crackers and snack foods that contain trans fat and hydrogenated oils. Meats and other protein foods Fatty cuts of meat. Ribs, chicken wings, bacon, sausage, bologna, salami, chitterlings, fatback, hot dogs, bratwurst, and packaged lunch meats. Liver and organ  meats. Whole eggs and egg yolks. Chicken and [place] with skin. Fried meat. Dairy Whole or 2% milk, cream, half-and-half, and cream cheese. Whole milk cheeses. Whole-fat or sweetened yogurt. Full-fat cheeses. Nondairy creamers and whipped toppings. Processed cheese, cheese spreads, and cheese curds. Beverages Alcohol. Sugar-sweetened drinks such as sodas, lemonade, and fruit drinks. Fats and oils Butter, stick margarine, lard, shortening, ghee, or bacon fat. Coconut, palm kernel, and palm oils. Sweets and desserts Corn syrup, sugars, honey, and molasses. Candy. Jam and jelly. Syrup. Sweetened cereals. Cookies, pies, cakes, donuts, muffins, and ice cream. The items listed above may not be a complete list of foods and beverages you should avoid. Contact a dietitian for more information. Summary Choosing the right foods helps keep your fat and cholesterol at normal levels. This can keep you from getting certain diseases. At meals, fill one-half of your plate with vegetables and green salads. Eat high-fiber foods, like whole grains, beans, apples, carrots, peas, and barley. Limit added sugar, saturated fats, alcohol, and fried foods. This information is not intended to replace advice given to you by your health care provider. Make sure you discuss any questions you have with your health care provider. Document Revised: 05/15/2018 Document Reviewed: 05/29/2017 Elsevier Patient Education  2020 [employer].

## 2021-10-05 ENCOUNTER — Ambulatory Visit: Payer: Self-pay | Admitting: Pharmacy Technician

## 2021-10-05 ENCOUNTER — Other Ambulatory Visit: Payer: Self-pay

## 2021-10-05 DIAGNOSIS — Z79899 Other long term (current) drug therapy: Secondary | ICD-10-CM

## 2021-10-05 NOTE — Progress Notes (Signed)
Completed [facility] application and contract.  Patient agreed to all terms of the [facility] contract.    Patient approved to receive medication assistance until time for re-certification in 0920, and as long as eligibility criteria continues to be met.    Provided patient with community resource material based on her particular needs.    [facility]

## 2021-10-06 ENCOUNTER — Other Ambulatory Visit: Payer: Self-pay

## 2021-10-28 ENCOUNTER — Other Ambulatory Visit: Payer: Self-pay

## 2021-11-09 ENCOUNTER — Other Ambulatory Visit: Payer: Self-pay

## 2021-11-10 ENCOUNTER — Ambulatory Visit (INDEPENDENT_AMBULATORY_CARE_PROVIDER_SITE_OTHER): Payer: Self-pay

## 2021-11-10 DIAGNOSIS — I255 Ischemic cardiomyopathy: Secondary | ICD-10-CM

## 2021-11-10 LAB — CUP PACEART REMOTE DEVICE CHECK
Battery Remaining Longevity: 68 mo
Battery Remaining Percentage: 65 %
Battery Voltage: 2.96 V
Brady Statistic RV Percent Paced: 1 %
Date Time Interrogation Session: 20230216020015
HighPow Impedance: 68 Ohm
HighPow Impedance: 68 Ohm
Implantable Lead Implant Date: 20180906
Implantable Lead Location: 753860
Implantable Pulse Generator Implant Date: 20180906
Lead Channel Impedance Value: 350 Ohm
Lead Channel Pacing Threshold Amplitude: 1 V
Lead Channel Pacing Threshold Pulse Width: 0.5 ms
Lead Channel Sensing Intrinsic Amplitude: 12 mV
Lead Channel Setting Pacing Amplitude: 2.5 V
Lead Channel Setting Pacing Pulse Width: 0.5 ms
Lead Channel Setting Sensing Sensitivity: 0.5 mV
Pulse Gen Serial Number: 9783396

## 2021-11-15 NOTE — Progress Notes (Signed)
Remote ICD transmission.   

## 2021-11-18 ENCOUNTER — Telehealth: Payer: Self-pay | Admitting: Internal Medicine

## 2021-11-18 ENCOUNTER — Other Ambulatory Visit: Payer: Self-pay

## 2021-11-18 MED ORDER — NITROGLYCERIN 0.4 MG SL SUBL
0.4000 mg | SUBLINGUAL_TABLET | SUBLINGUAL | 0 refills | Status: DC | PRN
  Filled 2021-11-18: qty 25, 25d supply, fill #0

## 2021-11-18 NOTE — Telephone Encounter (Signed)
Requested Prescriptions   Signed Prescriptions Disp Refills   nitroGLYCERIN (NITROSTAT) 0.4 MG SL tablet 25 tablet 0    Sig: Place 1 tablet (0.4 mg total) under the tongue every 5 (five) minutes as needed for chest pain.    Authorizing Provider: END, CHRISTOPHER    Ordering User: Long Brimage C    

## 2021-11-18 NOTE — Telephone Encounter (Signed)
°*  STAT* If patient is at the pharmacy, call can be transferred to refill team.   1. Which medications need to be refilled? (please list name of each medication and dose if known) Nitrostat 0.4 mg tablet  2. Which pharmacy/location (including street and city if local pharmacy) is medication to be sent to? [facility]  3. Do they need a 30 day or 90 day supply? 30day

## 2021-11-21 ENCOUNTER — Other Ambulatory Visit: Payer: Self-pay

## 2021-11-23 ENCOUNTER — Encounter: Payer: Self-pay | Admitting: Family Medicine

## 2021-11-23 ENCOUNTER — Other Ambulatory Visit: Payer: Self-pay

## 2021-11-23 ENCOUNTER — Ambulatory Visit (INDEPENDENT_AMBULATORY_CARE_PROVIDER_SITE_OTHER): Payer: Self-pay | Admitting: Family Medicine

## 2021-11-23 DIAGNOSIS — R748 Abnormal levels of other serum enzymes: Secondary | ICD-10-CM

## 2021-11-23 DIAGNOSIS — J309 Allergic rhinitis, unspecified: Secondary | ICD-10-CM

## 2021-11-23 DIAGNOSIS — E785 Hyperlipidemia, unspecified: Secondary | ICD-10-CM

## 2021-11-23 DIAGNOSIS — F331 Major depressive disorder, recurrent, moderate: Secondary | ICD-10-CM

## 2021-11-23 DIAGNOSIS — I5022 Chronic systolic (congestive) heart failure: Secondary | ICD-10-CM

## 2021-11-23 DIAGNOSIS — N393 Stress incontinence (female) (male): Secondary | ICD-10-CM

## 2021-11-23 MED ORDER — FUROSEMIDE 20 MG PO TABS: 20.0000 mg | ORAL_TABLET | Freq: Every day | ORAL | 0 refills | Status: DC | PRN

## 2021-11-23 MED ORDER — BUPROPION HCL ER (XL) 150 MG PO TB24
150.0000 mg | ORAL_TABLET | Freq: Every day | ORAL | 0 refills | Status: DC
  Filled 2021-11-23: qty 30, 30d supply, fill #0
  Filled 2022-01-02: qty 30, 30d supply, fill #1
  Filled 2022-02-01: qty 30, 30d supply, fill #2

## 2021-11-23 MED ORDER — FUROSEMIDE 20 MG PO TABS
20.0000 mg | ORAL_TABLET | Freq: Every day | ORAL | 0 refills | Status: DC | PRN
  Filled 2021-11-23: qty 30, 30d supply, fill #0

## 2021-11-23 NOTE — Assessment & Plan Note (Signed)
Much improved.  She will continue Lexapro and Wellbutrin.  She can continue as needed hydroxyzine.  She will monitor for drowsiness with hydroxyzine. ?

## 2021-11-23 NOTE — Assessment & Plan Note (Addendum)
Symptoms most consistent with allergic rhinitis and allergic conjunctivitis.  She was encouraged to try Claritin or Zyrtec.  If that is not beneficial she will let us know. ?

## 2021-11-23 NOTE — Assessment & Plan Note (Signed)
The patient seems to be generally euvolemic.  I will provide her with a prescription for Lasix 20 mg to take 1 tablet once daily as needed for swelling.  If she has to take this consistently she will need to let us know so we can get follow-up lab work. ?

## 2021-11-23 NOTE — Progress Notes (Signed)
?Crystal Alar, MD ?Phone: [phone] ? ?Crystal Roberson is a 56 y.o. female who presents today for follow-up. ? ?CHF: Patient notes she is taking carvedilol and spironolactone.  She has chronic stable dyspnea.  No orthopnea or PND.  She is occasionally had some recent lower extremity swelling.  She has not had any Lasix to take. ? ?Elevated alkaline phosphatase: She notes no right upper quadrant pain. ? ?Anxiety/depression: This is significantly better than it was previously.  She is on Lexapro and Wellbutrin.  No SI.  She notes hydroxyzine is helpful for acute anxiety and it only makes her mildly drowsy. ? ?Allergic rhinitis: Patient notes rhinorrhea and watery eyes going back for months.  She took an over-the-counter allergy pill that she thinks may have been generic Allegra that helped mildly.  She does have a distant history of allergies.  No sneezing or coughing. ? ?Stress incontinence: Patient notes at times when coughing or sneezing she will leak a little bit of urine. ? ?Social History  ? ?Tobacco Use  ?Smoking Status Former  ? Packs/day: 0.75  ? Years: 30.00  ? Pack years: 22.50  ? Types: Cigarettes  ? Quit date: 09/23/2016  ? Years since quitting: 5.1  ?Smokeless Tobacco Never  ? ? ?Current Outpatient Medications on File Prior to Visit  ?Medication Sig Dispense Refill  ? carvedilol (COREG) 6.25 MG tablet Take 1 tablet (6.25 mg total) by mouth 2 (two) times daily. 180 tablet 1  ? clopidogrel (PLAVIX) 75 MG tablet Take 1 tablet (75 mg total) by mouth once daily. 90 tablet 1  ? escitalopram (LEXAPRO) 10 MG tablet Take 1 tablet (10 mg total) by mouth once daily. 90 tablet 0  ? ezetimibe (ZETIA) 10 MG tablet Take 1 tablet (10 mg total) by mouth once daily. 90 tablet 3  ? hydrOXYzine (ATARAX) 10 MG tablet Take 1 tablet (10 mg total) by mouth every 8 (eight) hours as needed for anxiety. 30 tablet 0  ? lidocaine (LIDODERM) 5 % Place 2-3 patches onto the skin every 12 (twelve) hours as needed (pain). Remove &  Discard patch within 12 hours or as directed by MD    ? morphine (MS CONTIN) 15 MG 12 hr tablet Take 15 mg by mouth every 8 (eight) hours.     ? nitroGLYCERIN (NITROSTAT) 0.4 MG SL tablet Dissolve 1 tablet (0.4 mg total) under the tongue once every 5 (five) minutes as needed for chest pain. Max of 3 doses in 15 minutes. 25 tablet 0  ? potassium chloride SA (KLOR-CON M) 20 MEQ tablet TAKE 1 TABLET BY MOUTH ONCE DAILY. (IF YOU TAKE LASIX TAKE AN EXTRA DOSE AS DIRECTED) 90 tablet 1  ? rosuvastatin (CRESTOR) 40 MG tablet Take 1 tablet (40 mg total) by mouth once daily. 90 tablet 1  ? spironolactone (ALDACTONE) 25 MG tablet Take (1/2) tablet (12.5 mg total) by mouth 2 (two) times daily as needed. 90 tablet 0  ? tiZANidine (ZANAFLEX) 4 MG tablet Take 4-8 mg by mouth daily.    ? ?No current facility-administered medications on file prior to visit.  ? ? ? ?ROS see history of present illness ? ?Objective ? ?Physical Exam ?Vitals:  ? 11/23/21 1409  ?BP: 120/80  ?Pulse: 77  ?Temp: 99.1 ?F (37.3 ?C)  ?SpO2: 98%  ? ? ?BP Readings from Last 3 Encounters:  ?11/23/21 120/80  ?10/03/21 98/64  ?02/25/21 118/70  ? ?Wt Readings from Last 3 Encounters:  ?11/23/21 176 lb 12.8 oz (80.2 kg)  ?  10/03/21 172 lb (78 kg)  ?02/25/21 174 lb 9.6 oz (79.2 kg)  ? ? ?Physical Exam ?Constitutional:   ?   General: She is not in acute distress. ?   Appearance: She is not diaphoretic.  ?Cardiovascular:  ?   Rate and Rhythm: Normal rate and regular rhythm.  ?   Heart sounds: Normal heart sounds.  ?Pulmonary:  ?   Effort: Pulmonary effort is normal.  ?   Breath sounds: Normal breath sounds.  ?Musculoskeletal:  ?   Comments: Trace ankle edema  ?Skin: ?   General: Skin is warm and dry.  ?Neurological:  ?   Mental Status: She is alert.  ? ? ? ?Assessment/Plan: Please see individual problem list. ? ?Problem List Items Addressed This Visit   ? ? Allergic rhinitis  ?  Symptoms most consistent with allergic rhinitis and allergic conjunctivitis.  She was  encouraged to try Claritin or Zyrtec.  If that is not beneficial she will let us know. ?  ?  ? Chronic systolic heart failure (HCC)  ?  The patient seems to be generally euvolemic.  I will provide her with a prescription for Lasix 20 mg to take 1 tablet once daily as needed for swelling.  If she has to take this consistently she will need to let us know so we can get follow-up lab work. ?  ?  ? Relevant Medications  ? furosemide (LASIX) 20 MG tablet  ? Depression  ?  Much improved.  She will continue Lexapro and Wellbutrin.  She can continue as needed hydroxyzine.  She will monitor for drowsiness with hydroxyzine. ?  ?  ? Relevant Medications  ? buPROPion (WELLBUTRIN XL) 150 MG 24 hr tablet  ? Elevated alkaline phosphatase level  ?  The patient reports no abdominal pain.  Discussed GI referral.  We need to determine if she has patient assistance at this time.  If she does I will place referral.  If she does not have patient assistance she noted she would like to wait on this. ?  ?  ? Hyperlipidemia LDL goal <70  ?  The patient has lab work scheduled for tomorrow with cardiology.  She was encouraged to keep this. ?  ?  ? Relevant Medications  ? furosemide (LASIX) 20 MG tablet  ? Stress incontinence  ?  Encouraged kegal exercises.  ?  ?  ? ? ?Return in about 6 months (around 05/26/2022). ? ?This visit occurred during the SARS-CoV-2 public health emergency.  Safety protocols were in place, including screening questions prior to the visit, additional usage of staff PPE, and extensive cleaning of exam room while observing appropriate contact time as indicated for disinfecting solutions.  ? ? ?Crystal Alar, MD ?[facility] [place] ? ?

## 2021-11-23 NOTE — Assessment & Plan Note (Signed)
Encouraged kegal exercises.  ?

## 2021-11-23 NOTE — Assessment & Plan Note (Signed)
The patient reports no abdominal pain.  Discussed GI referral.  We need to determine if she has patient assistance at this time.  If she does I will place referral.  If she does not have patient assistance she noted she would like to wait on this. ?

## 2021-11-23 NOTE — Assessment & Plan Note (Signed)
The patient has lab work scheduled for tomorrow with cardiology.  She was encouraged to keep this. ?

## 2021-11-23 NOTE — Patient Instructions (Signed)
Nice to see you. ?I will let you know if we find out that she have patient assistance. ?If you have swelling in your legs you can take the Lasix though if you do not have swelling do not take it. ?You need to have your lab work completed tomorrow through cardiology. ?Please try over-the-counter Claritin or Zyrtec for your allergy symptoms. ?

## 2021-11-24 ENCOUNTER — Other Ambulatory Visit: Payer: Self-pay

## 2021-11-24 ENCOUNTER — Other Ambulatory Visit (INDEPENDENT_AMBULATORY_CARE_PROVIDER_SITE_OTHER): Payer: Self-pay

## 2021-11-24 DIAGNOSIS — E785 Hyperlipidemia, unspecified: Secondary | ICD-10-CM

## 2021-11-24 DIAGNOSIS — R0989 Other specified symptoms and signs involving the circulatory and respiratory systems: Secondary | ICD-10-CM

## 2021-11-25 LAB — COMPREHENSIVE METABOLIC PANEL
ALT: 33 IU/L — ABNORMAL HIGH (ref 0–32)
AST: 27 IU/L (ref 0–40)
Albumin/Globulin Ratio: 1.9 (ref 1.2–2.2)
Albumin: 4.3 g/dL (ref 3.8–4.9)
Alkaline Phosphatase: 121 IU/L (ref 44–121)
BUN/Creatinine Ratio: 12 (ref 9–23)
BUN: 12 mg/dL (ref 6–24)
Bilirubin Total: 0.7 mg/dL (ref 0.0–1.2)
CO2: 24 mmol/L (ref 20–29)
Calcium: 9.2 mg/dL (ref 8.7–10.2)
Chloride: 106 mmol/L (ref 96–106)
Creatinine, Ser: 1.02 mg/dL — ABNORMAL HIGH (ref 0.57–1.00)
Globulin, Total: 2.3 g/dL (ref 1.5–4.5)
Glucose: 118 mg/dL — ABNORMAL HIGH (ref 70–99)
Potassium: 4.1 mmol/L (ref 3.5–5.2)
Sodium: 144 mmol/L (ref 134–144)
Total Protein: 6.6 g/dL (ref 6.0–8.5)
eGFR: 64 mL/min/{1.73_m2} (ref >59)

## 2021-11-25 LAB — LIPID PANEL
Chol/HDL Ratio: 1.8 ratio (ref 0.0–4.4)
Cholesterol, Total: 109 mg/dL (ref 100–199)
HDL: 59 mg/dL (ref >39)
LDL Chol Calc (NIH): 33 mg/dL (ref 0–99)
Triglycerides: 87 mg/dL (ref 0–149)
VLDL Cholesterol Cal: 17 mg/dL (ref 5–40)

## 2021-11-30 ENCOUNTER — Ambulatory Visit: Payer: Self-pay | Admitting: Medical

## 2021-11-30 NOTE — Progress Notes (Deleted)
?Cardiology Office Note:   ? ?Date:  11/30/2021  ? ?ID:  Crystal Roberson, DOB 1965-10-01, MRN [id number] ? ?PCP:  Glori LuisSonnenberg, Eric G, MD  ?[facility] Cardiologist:  Yvonne Kendallhristopher End, MD  ?[facility] Electrophysiologist:  None  ? ?Referring MD: Glori LuisSonnenberg, Eric G, MD  ? ?Chief Complaint: 6 monthfollow-up ? ?History of Present Illness:   ? ?Crystal Roberson is a 56 y.o. female with a hx of chronic sytolic heart failure due to ICM s/p ICD 05/2016, HTN with suspicion for hyperaldosteronism, migraine headache, degenerative disc disease with chronic back pain, depression, CAD.  ? ?h/o CAD s/p anterior STEMI in 08/2016. She has a history of chronic back pain. She also has a history of patient reported labile BP with intermittent hypotension and lightheadedness, which is a chronic issues. [facility] with elevated BP at most visits.   ?  ?On 09/23/2016, she awoke early in a.m. with chest and back pain associated with nausea and emesis. She also reported a syncopal event thought likely d/t orthostatic hypotension. She was admitted with anterior STEMI and 99% mLAD stenosis treated with DES, as well as mild to moderate non-obstructive CAD involving the ostial and distal LAD, ostial and mLCx and mid/distal RCA. LVEF 25-35%. LVEDP 27mmHg. She was put on DAPT, BB, and aggressive secondary prevention. The hospital course was complicated by bleeding from her R femoral arteriotomy site and low BP limiting titration of evidence based heart failure therapy. Reassesment of the cath site showed no clinical evidence of pseudoaneurysm.  ?  ?She underwent repeat R/L cath 04/2017 to reassess her CAD and better understand her hemodynamics. This revealed a widely patent mLAD stent with moderate nonobstructive CAD. LVEF was notable for large anterior and apical wall motion abnormality with EF 30-35%. L/R heart filling pressures were upper normal. Fick CO was low normal.  ? ?Last seen 02/10/21 for follow-up on exertional  dyspnea. Labs were checked. Lisinopril was increased.  ? ?Today,  ? ?Past Medical History:  ?Diagnosis Date  ? Allergy   ? CAD (coronary artery disease)   ? a. 08/2016 Ant STEMI/PCI: LAD 9762m (2.25x20 Promus Premier DES); b. 04/2017 Cath: LM nl, LAD 20ost, 70ost->vasospasm, Patent mid stent, 50d, D1/2/3 small, LCX 20ost, 754m, OM1/2/3 small, RCA 3645m, 20d, EF 30-35%, mild to mod MR.  ? Chicken pox   ? Chronic back pain   ? Chronic fatigue   ? DDD (degenerative disc disease), lumbar   ? Depression   ? HFrEF (heart failure with reduced ejection fraction) (HCC)   ? a. 01/2017 Echo: Ef 35-40%, septal apical, distal inf, anterior HK. Mod dil LV. Mild to mod MR. mildly dil LA; b. 04/2017 LV Gram: EF 30-35%.  ? Hyperlipidemia LDL goal <70   ? Hypertension   ? Ischemic cardiomyopathy   ? a. 01/2017 Echo: EF 35-40%; b. 04/2017 LV Gram: EF 30-35%; c. 05/2017 s/p SJM 1411-36Q Ellipse VR RV only AICD.  ? Migraines   ? Syncope 09/12/2016  ? ? ?Past Surgical History:  ?Procedure Laterality Date  ? CARDIAC CATHETERIZATION N/A 09/23/2016  ? Procedure: Left Heart Cath and Coronary Angiography;  Surgeon: Yvonne Kendallhristopher End, MD;  [facility];  Service: Cardiovascular;  Laterality: N/A;  ? CARDIAC CATHETERIZATION N/A 09/23/2016  ? Procedure: Coronary Stent Intervention;  Surgeon: Yvonne Kendallhristopher End, MD;  Location: [facility] CV LAB;  Service: Cardiovascular;  Laterality: N/A;  ? CERVICAL DISC SURGERY    ? ICD IMPLANT N/A 05/31/2017  ? Procedure:  ICD Implant;  Surgeon: Regan Lemming, MD;  Location: [facility] INVASIVE CV LAB;  Service: Cardiovascular;  Laterality: N/A;  ? RIGHT/LEFT HEART CATH AND CORONARY ANGIOGRAPHY N/A 04/26/2017  ? Procedure: Right/Left Heart Cath and Coronary Angiography;  Surgeon: Yvonne Kendall, MD;  Location: [facility] INVASIVE CV LAB;  Service: Cardiovascular;  Laterality: N/A;  ? TUBAL LIGATION    ? ULTRASOUND GUIDANCE FOR VASCULAR ACCESS  04/26/2017  ? Procedure: Ultrasound Guidance For Vascular Access;  Surgeon: Yvonne Kendall, MD;  Location: [facility] INVASIVE CV LAB;  Service: Cardiovascular;;  ? ? ?Current Medications: ?No outpatient medications have been marked as taking for the 11/30/21 encounter (Appointment) with Fransico Michael, Antar Milks H, PA-C.  ?  ? ?Allergies:   Pregabalin and Codeine  ? ?Social History  ? ?Socioeconomic History  ? Marital status: Divorced  ?  Spouse name: Not on file  ? Number of children: Not on file  ? Years of education: Not on file  ? Highest education level: Not on file  ?Occupational History  ? Not on file  ?Tobacco Use  ? Smoking status: Former  ?  Packs/day: 0.75  ?  Years: 30.00  ?  Pack years: 22.50  ?  Types: Cigarettes  ?  Quit date: 09/23/2016  ?  Years since quitting: 5.1  ? Smokeless tobacco: Never  ?Vaping Use  ? Vaping Use: Never used  ?Substance and Sexual Activity  ? Alcohol use: No  ?  Alcohol/week: 0.0 standard drinks  ? Drug use: No  ? Sexual activity: Not Currently  ?Other Topics Concern  ? Not on file  ?Social History Narrative  ? Not on file  ? ?Social Determinants of Health  ? ?Financial Resource Strain: Not on file  ?Food Insecurity: Not on file  ?Transportation Needs: Not on file  ?Physical Activity: Not on file  ?Stress: Not on file  ?Social Connections: Not on file  ?  ? ?Family History: ?The patient's family history includes Arthritis in her mother; Heart disease in her maternal grandmother; Hyperlipidemia in her mother; Hypertension in her maternal grandmother and mother; Mental illness in her mother. ? ?ROS:   ?Please see the history of present illness.    ? All other systems reviewed and are negative. ? ?EKGs/Labs/Other Studies Reviewed:   ? ?The following studies were reviewed today: ?*** ? ?EKG:  EKG is *** ordered today.  The ekg ordered today demonstrates *** ? ?Recent Labs: ?02/10/2021: Hemoglobin 14.1; Platelets 203 ?02/25/2021: TSH 3.470 ?11/24/2021: ALT 33; BUN 12; Creatinine, Ser 1.02; Potassium 4.1; Sodium 144  ?Recent Lipid Panel ?   ?Component Value Date/Time  ? CHOL 109  11/24/2021 0846  ? TRIG 87 11/24/2021 0846  ? HDL 59 11/24/2021 0846  ? CHOLHDL 1.8 11/24/2021 0846  ? CHOLHDL 3.9 02/10/2021 1030  ? VLDL 34 02/10/2021 1030  ? LDLCALC 33 11/24/2021 0846  ? ? ? ?Risk Assessment/Calculations:   ?{Does this patient have ATRIAL FIBRILLATION?[phone]} ? ? ?Physical Exam:   ? ?VS:  There were no vitals taken for this visit.   ? ?Wt Readings from Last 3 Encounters:  ?11/23/21 176 lb 12.8 oz (80.2 kg)  ?10/03/21 172 lb (78 kg)  ?02/25/21 174 lb 9.6 oz (79.2 kg)  ?  ? ?GEN: *** Well nourished, well developed in no acute distress ?HEENT: Normal ?NECK: No JVD; No carotid bruits ?LYMPHATICS: No lymphadenopathy ?CARDIAC: ***RRR, no murmurs, rubs, gallops ?RESPIRATORY:  Clear to auscultation without rales, wheezing or rhonchi  ?ABDOMEN: Soft, non-tender, non-distended ?MUSCULOSKELETAL:  No  edema; No deformity  ?SKIN: Warm and dry ?NEUROLOGIC:  Alert and oriented x 3 ?PSYCHIATRIC:  Normal affect  ? ?ASSESSMENT:   ? ?No diagnosis found. ?PLAN:   ? ?In order of problems listed above: ? ?CAD  ? ?Chronic HFrEF ?ICM ? ?HTN ? ?HLD ? ?Disposition: Follow up {follow up:15908} with ***  ? ?Shared Decision Making/Informed Consent   ?{Are you ordering a CV Procedure (e.g. stress test, cath, DCCV, TEE, etc)?   Press F2        :[id number]}  ? ? ?Signed, ?Tola Meas David Stall, PA-C  ?11/30/2021 8:21 AM    ?[facility]  ?

## 2022-01-02 ENCOUNTER — Other Ambulatory Visit: Payer: Self-pay | Admitting: Internal Medicine

## 2022-01-02 ENCOUNTER — Other Ambulatory Visit: Payer: Self-pay

## 2022-01-02 MED ORDER — SPIRONOLACTONE 25 MG PO TABS
12.5000 mg | ORAL_TABLET | Freq: Two times a day (BID) | ORAL | 0 refills | Status: DC | PRN
  Filled 2022-01-02: qty 30, 30d supply, fill #0
  Filled 2022-02-01: qty 30, 30d supply, fill #1
  Filled 2022-03-05: qty 30, 30d supply, fill #2
  Filled 2022-03-06: qty 30, 30d supply, fill #0

## 2022-01-16 ENCOUNTER — Ambulatory Visit (INDEPENDENT_AMBULATORY_CARE_PROVIDER_SITE_OTHER): Payer: Self-pay | Admitting: Medical

## 2022-01-16 ENCOUNTER — Encounter: Payer: Self-pay | Admitting: Medical

## 2022-01-16 VITALS — BP 120/78 | HR 62 | Ht 65.0 in | Wt 176.2 lb

## 2022-01-16 DIAGNOSIS — I5032 Chronic diastolic (congestive) heart failure: Secondary | ICD-10-CM

## 2022-01-16 DIAGNOSIS — E782 Mixed hyperlipidemia: Secondary | ICD-10-CM

## 2022-01-16 DIAGNOSIS — I2510 Atherosclerotic heart disease of native coronary artery without angina pectoris: Secondary | ICD-10-CM

## 2022-01-16 DIAGNOSIS — I255 Ischemic cardiomyopathy: Secondary | ICD-10-CM

## 2022-01-16 DIAGNOSIS — R42 Dizziness and giddiness: Secondary | ICD-10-CM

## 2022-01-16 DIAGNOSIS — I10 Essential (primary) hypertension: Secondary | ICD-10-CM

## 2022-01-16 NOTE — Patient Instructions (Signed)
Medication Instructions:  Your physician recommends that you continue on your current medications as directed. Please refer to the Current Medication list given to you today.   *If you need a refill on your cardiac medications before your next appointment, please call your pharmacy*   Lab Work: None ordered  If you have labs (blood work) drawn today and your tests are completely normal, you will receive your results only by: MyChart Message (if you have MyChart) OR A paper copy in the mail If you have any lab test that is abnormal or we need to change your treatment, we will call you to review the results.   Testing/Procedures: None ordered   Follow-Up: At [facility], you and your health needs are our priority.  As part of our continuing mission to provide you with exceptional heart care, we have created designated Provider Care Teams.  These Care Teams include your primary Cardiologist (physician) and Advanced Practice Providers (APPs -  Physician Assistants and Nurse Practitioners) who all work together to provide you with the care you need, when you need it.  We recommend signing up for the patient portal called "MyChart".  Sign up information is provided on this After Visit Summary.  MyChart is used to connect with patients for Virtual Visits (Telemedicine).  Patients are able to view lab/test results, encounter notes, upcoming appointments, etc.  Non-urgent messages can be sent to your provider as well.   To learn more about what you can do with MyChart, go to [email or web address].    Your next appointment:   3 month(s)  The format for your next appointment:   In Person  Provider:   You may see Christopher End, MD or one of the following Advanced Practice Providers on your designated Care Team:   Christopher Berge, NP Ryan Dunn, PA-C Cadence Furth, PA-C   Other Instructions N/A  Important Information About Sugar       

## 2022-01-16 NOTE — Progress Notes (Signed)
?Cardiology Office Note:   ? ?Date:  01/16/2022  ? ?ID:  Crystal Roberson, DOB [birth year], MRN [id number] ? ?PCP:  Glori Luis, MD  ?[facility] Cardiologist:  Yvonne Kendall, MD  ?[facility] Electrophysiologist:  None  ? ?Referring MD: Glori Luis, MD  ? ?Chief Complaint: 8 week follow-up ? ?History of Present Illness:   ? ?Crystal Roberson is a 56 y.o. female with a hx of with a hx of coronary artery disease status post anterior STEMI in 08/2016, chronic systolic heart failure due to ischemic cardiomyopathy status post ICD (05/2016), hypertension with suspicion for hyperaldosteronism, migraine headaches, degenerative disc disease with chronic back pain, and depression who presents today for 32-month follow-up of her coronary artery disease and chronic HFrEF.  She was last seen by Dr. Okey Dupre May 2022. ?  ?Previously she was seen for exertional dyspnea.  Her activity was limited to her chronic back pain.  At times her blood pressure readings were up to 200 mmHg systolic.  Her carvedilol was increased to 6.25 mg twice daily.  Her dose of lisinopril and spironolactone were continued with plans to obtain a CMP (this was never completed). ? ?She was last seen 10/03/21 and reported dizziness with change in position. Orthostatics were marginally positive. Lisinopril was stopped. Crestor was increased and Zetia was started. Lipid panel was re-ordered. ? ?Today, the patient reports she still has rare dizziness. It occurs with different times, worse with position changes. Does not occur at rest. She is drinking more and eating normally. She has ICD, checks have been unremarkable. BP has been better off the lisinopril. No chest pain. She has chronic shortness of breath, occurs daily. Also has anixety and panic attacks. Follow-up cholesterol looked good. Orthostatics today negative.  ? ?Past Medical History:  ?Diagnosis Date  ? Allergy   ? CAD (coronary artery disease)   ? a. 08/2016 Ant STEMI/PCI: LAD 53m (2.25x20 Promus  Premier DES); b. 04/2017 Cath: LM nl, LAD 20ost, 70ost->vasospasm, Patent mid stent, 50d, D1/2/3 small, LCX 20ost, 51m, OM1/2/3 small, RCA 85m, 20d, EF 30-35%, mild to mod MR.  ? Chicken pox   ? Chronic back pain   ? Chronic fatigue   ? DDD (degenerative disc disease), lumbar   ? Depression   ? HFrEF (heart failure with reduced ejection fraction) (HCC)   ? a. 01/2017 Echo: Ef 35-40%, septal apical, distal inf, anterior HK. Mod dil LV. Mild to mod MR. mildly dil LA; b. 04/2017 LV Gram: EF 30-35%.  ? Hyperlipidemia LDL goal <70   ? Hypertension   ? Ischemic cardiomyopathy   ? a. 01/2017 Echo: EF 35-40%; b. 04/2017 LV Gram: EF 30-35%; c. 05/2017 s/p SJM 1411-36Q Ellipse VR RV only AICD.  ? Migraines   ? Syncope 09/12/2016  ? ? ?Past Surgical History:  ?Procedure Laterality Date  ? CARDIAC CATHETERIZATION N/A 09/23/2016  ? Procedure: Left Heart Cath and Coronary Angiography;  Surgeon: Yvonne Kendall, MD;  Location: [facility] INVASIVE CV LAB;  Service: Cardiovascular;  Laterality: N/A;  ? CARDIAC CATHETERIZATION N/A 09/23/2016  ? Procedure: Coronary Stent Intervention;  Surgeon: Yvonne Kendall, MD;  Location: [facility] INVASIVE CV LAB;  Service: Cardiovascular;  Laterality: N/A;  ? CERVICAL DISC SURGERY    ? ICD IMPLANT N/A 05/31/2017  ? Procedure: ICD Implant;  Surgeon: Regan Lemming, MD;  Location: [facility] INVASIVE CV LAB;  Service: Cardiovascular;  Laterality: N/A;  ? RIGHT/LEFT HEART CATH AND CORONARY ANGIOGRAPHY N/A 04/26/2017  ? Procedure: Right/Left Heart Cath  and Coronary Angiography;  Surgeon: Yvonne KendallEnd, Christopher, MD;  Location: [facility] INVASIVE CV LAB;  Service: Cardiovascular;  Laterality: N/A;  ? TUBAL LIGATION    ? ULTRASOUND GUIDANCE FOR VASCULAR ACCESS  04/26/2017  ? Procedure: Ultrasound Guidance For Vascular Access;  Surgeon: Yvonne KendallEnd, Christopher, MD;  Location: [facility] INVASIVE CV LAB;  Service: Cardiovascular;;  ? ? ?Current Medications: ?Current Meds  ?Medication Sig  ? buPROPion (WELLBUTRIN XL) 150 MG 24 hr tablet Take 1 tablet (150 mg  total) by mouth once daily.  ? carvedilol (COREG) 6.25 MG tablet Take 1 tablet (6.25 mg total) by mouth 2 (two) times daily.  ? clopidogrel (PLAVIX) 75 MG tablet Take 1 tablet (75 mg total) by mouth once daily.  ? escitalopram (LEXAPRO) 10 MG tablet Take 1 tablet (10 mg total) by mouth once daily.  ? ezetimibe (ZETIA) 10 MG tablet Take 1 tablet (10 mg total) by mouth once daily.  ? furosemide (LASIX) 20 MG tablet Take 1 tablet (20 mg total) by mouth once daily as needed.  ? hydrOXYzine (ATARAX) 10 MG tablet Take 1 tablet (10 mg total) by mouth every 8 (eight) hours as needed for anxiety.  ? lidocaine (LIDODERM) 5 % Place 2-3 patches onto the skin every 12 (twelve) hours as needed (pain). Remove & Discard patch within 12 hours or as directed by MD  ? morphine (MS CONTIN) 15 MG 12 hr tablet Take 15 mg by mouth every 8 (eight) hours.   ? nitroGLYCERIN (NITROSTAT) 0.4 MG SL tablet Dissolve 1 tablet (0.4 mg total) under the tongue once every 5 (five) minutes as needed for chest pain. Max of 3 doses in 15 minutes.  ? potassium chloride SA (KLOR-CON M) 20 MEQ tablet TAKE 1 TABLET BY MOUTH ONCE DAILY. (IF YOU TAKE LASIX TAKE AN EXTRA DOSE AS DIRECTED)  ? rosuvastatin (CRESTOR) 40 MG tablet Take 1 tablet (40 mg total) by mouth once daily.  ? spironolactone (ALDACTONE) 25 MG tablet Take (1/2) tablet (12.5 mg total) by mouth 2 (two) times daily as needed.  ? tiZANidine (ZANAFLEX) 4 MG tablet Take 4-8 mg by mouth daily.  ?  ? ?Allergies:   Pregabalin and Codeine  ? ?Social History  ? ?Socioeconomic History  ? Marital status: Divorced  ?  Spouse name: Not on file  ? Number of children: Not on file  ? Years of education: Not on file  ? Highest education level: Not on file  ?Occupational History  ? Not on file  ?Tobacco Use  ? Smoking status: Former  ?  Packs/day: 0.75  ?  Years: 30.00  ?  Pack years: 22.50  ?  Types: Cigarettes  ?  Quit date: 09/23/2016  ?  Years since quitting: 5.3  ? Smokeless tobacco: Never  ?Vaping Use  ?  Vaping Use: Never used  ?Substance and Sexual Activity  ? Alcohol use: No  ?  Alcohol/week: 0.0 standard drinks  ? Drug use: No  ? Sexual activity: Not Currently  ?Other Topics Concern  ? Not on file  ?Social History Narrative  ? Not on file  ? ?Social Determinants of Health  ? ?Financial Resource Strain: Not on file  ?Food Insecurity: Not on file  ?Transportation Needs: Not on file  ?Physical Activity: Not on file  ?Stress: Not on file  ?Social Connections: Not on file  ?  ? ?Family History: ?The patient's family history includes Arthritis in her mother; Heart disease in her maternal grandmother; Hyperlipidemia in her mother; Hypertension in her  maternal grandmother and mother; Mental illness in her mother. ? ?ROS:   ?Please see the history of present illness.    ? All other systems reviewed and are negative. ? ?EKGs/Labs/Other Studies Reviewed:   ? ?The following studies were reviewed today: ? ?Echocardiogram 02/10/2019 ?  ?IMPRESSIONS  ? ? ? 1. There is akinesis of the left ventricular, mid-apical anterior wall,  ?apical segment and anteroseptal wall.  ? 2. The left ventricle has mild-moderately reduced systolic function, with  ?an ejection fraction of 40-45%. The cavity size was normal. Left  ?ventricular diastolic Doppler parameters are consistent with impaired  ?relaxation.  ? 3. The right ventricle has normal systolic function. The cavity was  ?normal. There is no increase in right ventricular wall thickness. Right  ?ventricular systolic pressure could not be assessed.  ? 4. The aortic valve was not well visualized.  ? 5. The interatrial septum was not well visualized.  ? ?  ?Limited TTE (01/23/17): Dilated left ventricle with normal wall thickness. LVEF 35-40% with apical anterior, apical septal, and apical inferior hypokinesis. Mild to moderate MR. Mild left atrial enlargement. Normal RV size and function. ?  ?Transthoracic echocardiogram (11/06/16): LVEF 30-35% with severe hypokinesis of mid/apical septal,  anterior, and lateral walls, as well as the apex. Moderate to severe MR. Mild left atrial enlargment. Moderate pulmonary hypertension. ?  ?Transthoracic echocardiogram (09/13/16): Normal LV size and contract

## 2022-01-26 ENCOUNTER — Other Ambulatory Visit: Payer: Self-pay

## 2022-02-01 ENCOUNTER — Other Ambulatory Visit: Payer: Self-pay | Admitting: Family Medicine

## 2022-02-02 ENCOUNTER — Other Ambulatory Visit: Payer: Self-pay

## 2022-02-02 MED ORDER — ESCITALOPRAM OXALATE 10 MG PO TABS
10.0000 mg | ORAL_TABLET | Freq: Every day | ORAL | 0 refills | Status: DC
  Filled 2022-02-02: qty 30, 30d supply, fill #0
  Filled 2022-03-05: qty 30, 30d supply, fill #1
  Filled 2022-03-06: qty 30, 30d supply, fill #0
  Filled 2022-04-01: qty 30, 30d supply, fill #1

## 2022-02-09 ENCOUNTER — Ambulatory Visit (INDEPENDENT_AMBULATORY_CARE_PROVIDER_SITE_OTHER): Payer: Self-pay

## 2022-02-09 DIAGNOSIS — I255 Ischemic cardiomyopathy: Secondary | ICD-10-CM

## 2022-02-09 LAB — CUP PACEART REMOTE DEVICE CHECK
Battery Remaining Longevity: 66 mo
Battery Remaining Percentage: 63 %
Battery Voltage: 2.96 V
Brady Statistic RV Percent Paced: 1 %
Date Time Interrogation Session: 20230518020032
HighPow Impedance: 70 Ohm
HighPow Impedance: 70 Ohm
Implantable Lead Implant Date: 20180906
Implantable Lead Location: 753860
Implantable Pulse Generator Implant Date: 20180906
Lead Channel Impedance Value: 350 Ohm
Lead Channel Pacing Threshold Amplitude: 1 V
Lead Channel Pacing Threshold Pulse Width: 0.5 ms
Lead Channel Sensing Intrinsic Amplitude: 12 mV
Lead Channel Setting Pacing Amplitude: 2.5 V
Lead Channel Setting Pacing Pulse Width: 0.5 ms
Lead Channel Setting Sensing Sensitivity: 0.5 mV
Pulse Gen Serial Number: 9783396

## 2022-02-17 NOTE — Progress Notes (Signed)
Remote ICD transmission.   

## 2022-03-05 ENCOUNTER — Other Ambulatory Visit: Payer: Self-pay | Admitting: Family Medicine

## 2022-03-05 DIAGNOSIS — F331 Major depressive disorder, recurrent, moderate: Secondary | ICD-10-CM

## 2022-03-06 ENCOUNTER — Other Ambulatory Visit: Payer: Self-pay

## 2022-03-06 MED ORDER — BUPROPION HCL ER (XL) 150 MG PO TB24
150.0000 mg | ORAL_TABLET | Freq: Every day | ORAL | 0 refills | Status: DC
  Filled 2022-03-06 – 2022-04-01 (×3): qty 90, 90d supply, fill #0

## 2022-03-07 ENCOUNTER — Other Ambulatory Visit: Payer: Self-pay

## 2022-03-24 ENCOUNTER — Other Ambulatory Visit: Payer: Self-pay

## 2022-04-01 ENCOUNTER — Other Ambulatory Visit: Payer: Self-pay | Admitting: Internal Medicine

## 2022-04-02 ENCOUNTER — Other Ambulatory Visit: Payer: Self-pay

## 2022-04-03 ENCOUNTER — Other Ambulatory Visit: Payer: Self-pay

## 2022-04-03 MED ORDER — CARVEDILOL 6.25 MG PO TABS
6.2500 mg | ORAL_TABLET | Freq: Two times a day (BID) | ORAL | 2 refills | Status: DC
  Filled 2022-04-03: qty 180, 90d supply, fill #0
  Filled 2022-05-17 – 2022-10-17 (×2): qty 180, 90d supply, fill #1
  Filled 2023-02-07: qty 180, 90d supply, fill #2

## 2022-04-03 MED ORDER — CLOPIDOGREL BISULFATE 75 MG PO TABS
75.0000 mg | ORAL_TABLET | Freq: Every day | ORAL | 3 refills | Status: DC
  Filled 2022-04-03: qty 90, 90d supply, fill #0
  Filled 2022-07-25: qty 90, 90d supply, fill #1
  Filled 2022-10-17: qty 90, 90d supply, fill #2
  Filled 2023-02-24 – 2023-03-07 (×2): qty 90, 90d supply, fill #3

## 2022-04-03 MED ORDER — ROSUVASTATIN CALCIUM 40 MG PO TABS
40.0000 mg | ORAL_TABLET | Freq: Every day | ORAL | 3 refills | Status: DC
  Filled 2022-04-03: qty 90, 90d supply, fill #0
  Filled 2022-07-25: qty 90, 90d supply, fill #1
  Filled 2022-10-17: qty 90, 90d supply, fill #2
  Filled 2023-02-24 – 2023-03-07 (×2): qty 90, 90d supply, fill #3

## 2022-04-03 MED ORDER — SPIRONOLACTONE 25 MG PO TABS
12.5000 mg | ORAL_TABLET | Freq: Two times a day (BID) | ORAL | 3 refills | Status: DC | PRN
Start: 2022-04-03 — End: 2023-05-30
  Filled 2022-04-03: qty 90, 90d supply, fill #0
  Filled 2022-07-25: qty 90, 90d supply, fill #1
  Filled 2022-10-17: qty 90, 90d supply, fill #2
  Filled 2023-02-07: qty 90, 90d supply, fill #3

## 2022-04-03 MED ORDER — POTASSIUM CHLORIDE CRYS ER 20 MEQ PO TBCR
EXTENDED_RELEASE_TABLET | ORAL | 3 refills | Status: DC
  Filled 2022-04-03: qty 90, 90d supply, fill #0
  Filled 2022-07-25: qty 90, 90d supply, fill #1
  Filled 2022-12-02: qty 90, 90d supply, fill #2
  Filled 2023-02-24 – 2023-03-07 (×2): qty 90, 90d supply, fill #3

## 2022-04-07 ENCOUNTER — Other Ambulatory Visit: Payer: Self-pay

## 2022-04-17 ENCOUNTER — Ambulatory Visit (INDEPENDENT_AMBULATORY_CARE_PROVIDER_SITE_OTHER): Payer: Self-pay | Admitting: Medical

## 2022-04-17 ENCOUNTER — Encounter: Payer: Self-pay | Admitting: Medical

## 2022-04-17 VITALS — BP 126/76 | HR 64 | Ht 65.0 in | Wt 178.8 lb

## 2022-04-17 DIAGNOSIS — I5022 Chronic systolic (congestive) heart failure: Secondary | ICD-10-CM

## 2022-04-17 DIAGNOSIS — I10 Essential (primary) hypertension: Secondary | ICD-10-CM

## 2022-04-17 DIAGNOSIS — E782 Mixed hyperlipidemia: Secondary | ICD-10-CM

## 2022-04-17 DIAGNOSIS — I255 Ischemic cardiomyopathy: Secondary | ICD-10-CM

## 2022-04-17 DIAGNOSIS — I2510 Atherosclerotic heart disease of native coronary artery without angina pectoris: Secondary | ICD-10-CM

## 2022-04-17 NOTE — Patient Instructions (Signed)
Medication Instructions:  - Your physician recommends that you continue on your current medications as directed. Please refer to the Current Medication list given to you today.  *If you need a refill on your cardiac medications before your next appointment, please call your pharmacy*   Lab Work: - none ordered  If you have labs (blood work) drawn today and your tests are completely normal, you will receive your results only by: MyChart Message (if you have MyChart) OR A paper copy in the mail If you have any lab test that is abnormal or we need to change your treatment, we will call you to review the results.   Testing/Procedures: - none ordered   Follow-Up: At [facility], you and your health needs are our priority.  As part of our continuing mission to provide you with exceptional heart care, we have created designated Provider Care Teams.  These Care Teams include your primary Cardiologist (physician) and Advanced Practice Providers (APPs -  Physician Assistants and Nurse Practitioners) who all work together to provide you with the care you need, when you need it.  We recommend signing up for the patient portal called "MyChart".  Sign up information is provided on this After Visit Summary.  MyChart is used to connect with patients for Virtual Visits (Telemedicine).  Patients are able to view lab/test results, encounter notes, upcoming appointments, etc.  Non-urgent messages can be sent to your provider as well.   To learn more about what you can do with MyChart, go to [email or web address].    Your next appointment:   3 month(s)  The format for your next appointment:   In Person  Provider:   You may see Yvonne Kendall, MD or one of the following Advanced Practice Providers on your designated Care Team:    Cadence Fransico Michael, [place]    Other Instructions N/a  Important Information About Sugar

## 2022-04-17 NOTE — Progress Notes (Signed)
Cardiology Office Note:    Date:  04/17/2022   ID:  BRITTANY WILLCUT, DOB November 14, 1965, MRN [id number]  PCP:  Glori Luis, MD  [facility] Cardiologist:  Yvonne Kendall, MD  [facility] Electrophysiologist:  None   Referring MD: Glori Luis, MD   Chief Complaint: 3 month follow-up  History of Present Illness:    Crystal Roberson is a 56 y.o. female with a hx of coronary artery disease status post anterior STEMI in 08/2016, chronic systolic heart failure due to ischemic cardiomyopathy status post ICD (05/2016), hypertension with suspicion for hyperaldosteronism, migraine headaches, degenerative disc disease with chronic back pain, and depression who presents today for 23-month follow-up of her coronary artery disease and chronic HFrEF.     Previously she was seen for exertional dyspnea.  Her activity was limited to her chronic back pain.  At times her blood pressure readings were up to 200 mmHg systolic.  Her carvedilol was increased to 6.25 mg twice daily.  Her dose of lisinopril and spironolactone were continued with plans to obtain a CMP (this was never completed).   She was seen 10/03/21 and reported dizziness with change in position. Orthostatics were marginally positive. Lisinopril was stopped. Crestor was increased and Zetia was started. Lipid panel was re-ordered.  Last seen 01/16/22 and reported improved dizziness. Orthostatics were negative and ICD interrogation was unremarkable.   Today, the patient reports she has been about the same. She had a difficult weekend with her [place] needing [place] go [place] the [place]. About 3 weeks ago she had an episode of low BP, dry mouth with severe back pain. Possibly could be from stress. Husband has been out of work and finances have been stressful. No chest pain. She has chronic and unchanged SOB. No LLE, orthopnea, pnd. She takes lasix as needed.     Past Medical History:  Diagnosis Date   Allergy    CAD (coronary artery disease)    a. 08/2016  Ant STEMI/PCI: LAD 30m (2.25x20 Promus Premier DES); b. 04/2017 Cath: LM nl, LAD 20ost, 70ost->vasospasm, Patent mid stent, 50d, D1/2/3 small, LCX 20ost, 67m, OM1/2/3 small, RCA 31m, 20d, EF 30-35%, mild to mod MR.   Chicken pox    Chronic back pain    Chronic fatigue    DDD (degenerative disc disease), lumbar    Depression    HFrEF (heart failure with reduced ejection fraction) (HCC)    a. 01/2017 Echo: Ef 35-40%, septal apical, distal inf, anterior HK. Mod dil LV. Mild to mod MR. mildly dil LA; b. 04/2017 LV Gram: EF 30-35%.   Hyperlipidemia LDL goal <70    Hypertension    Ischemic cardiomyopathy    a. 01/2017 Echo: EF 35-40%; b. 04/2017 LV Gram: EF 30-35%; c. 05/2017 s/p SJM 1411-36Q Ellipse VR RV only AICD.   Migraines    Syncope 09/12/2016    Past Surgical History:  Procedure Laterality Date   CARDIAC CATHETERIZATION N/A 09/23/2016   Procedure: Left Heart Cath and Coronary Angiography;  Surgeon: Yvonne Kendall, MD;  Location[facility] INVASIVE CV LAB;  Service: Cardiovascular;  Laterality: N/A;   CARDIAC CATHETERIZATION N/A 09/23/2016   Procedure: Coronary Stent Intervention;  Surgeon: Yvonne Kendall, MD;  Location: [facility] INVASIVE CV LAB;  Service: Cardiovascular;  Laterality: N/A;   CERVICAL DISC SURGERY     ICD IMPLANT N/A 05/31/2017   Procedure: ICD Implant;  Surgeon: Regan Lemming, MD;  Location: [facility] INVASIVE CV LAB;  Service: Cardiovascular;  Laterality: N/A;  RIGHT/LEFT HEART CATH AND CORONARY ANGIOGRAPHY N/A 04/26/2017   Procedure: Right/Left Heart Cath and Coronary Angiography;  Surgeon: Nelva Bush, MD;  Location: [place] CV LAB;  Service: Cardiovascular;  Laterality: N/A;   TUBAL LIGATION     ULTRASOUND GUIDANCE FOR VASCULAR ACCESS  04/26/2017   Procedure: Ultrasound Guidance For Vascular Access;  Surgeon: Nelva Bush, MD;  Location: [place] CV LAB;  Service: Cardiovascular;;    Current Medications: Current Meds  Medication Sig   buPROPion (WELLBUTRIN XL) 150  MG 24 hr tablet Take 1 tablet (150 mg total) by mouth once daily.   carvedilol (COREG) 6.25 MG tablet Take 1 tablet (6.25 mg total) by mouth 2 (two) times daily.   clopidogrel (PLAVIX) 75 MG tablet Take 1 tablet (75 mg total) by mouth once daily.   escitalopram (LEXAPRO) 10 MG tablet Take 1 tablet (10 mg total) by mouth once daily.   ezetimibe (ZETIA) 10 MG tablet Take 1 tablet (10 mg total) by mouth once daily.   furosemide (LASIX) 20 MG tablet Take 1 tablet (20 mg total) by mouth once daily as needed.   hydrOXYzine (ATARAX) 10 MG tablet Take 1 tablet (10 mg total) by mouth every 8 (eight) hours as needed for anxiety.   lidocaine (LIDODERM) 5 % Place 2-3 patches onto the skin every 12 (twelve) hours as needed (pain). Remove & Discard patch within 12 hours or as directed by MD   morphine (MS CONTIN) 15 MG 12 hr tablet Take 15 mg by mouth every 8 (eight) hours.    nitroGLYCERIN (NITROSTAT) 0.4 MG SL tablet Dissolve 1 tablet (0.4 mg total) under the tongue once every 5 (five) minutes as needed for chest pain. Max of 3 doses in 15 minutes.   potassium chloride SA (KLOR-CON M) 20 MEQ tablet TAKE 1 TABLET BY MOUTH ONCE DAILY. (IF YOU TAKE LASIX TAKE AN EXTRA DOSE AS DIRECTED)   rosuvastatin (CRESTOR) 40 MG tablet Take 1 tablet (40 mg total) by mouth once daily.   spironolactone (ALDACTONE) 25 MG tablet Take (1/2) tablet (12.5 mg total) by mouth 2 (two) times daily as needed.   tiZANidine (ZANAFLEX) 4 MG tablet Take 4-8 mg by mouth daily.     Allergies:   Pregabalin and Codeine   Social History   Socioeconomic History   Marital status: Divorced    Spouse name: Not on file   Number of children: Not on file   Years of education: Not on file   Highest education level: Not on file  Occupational History   Not on file  Tobacco Use   Smoking status: Former    Packs/day: 0.75    Years: 30.00    Total pack years: 22.50    Types: Cigarettes    Quit date: 09/23/2016    Years since quitting: 5.5    Smokeless tobacco: Never  Vaping Use   Vaping Use: Never used  Substance and Sexual Activity   Alcohol use: No    Alcohol/week: 0.0 standard drinks of alcohol   Drug use: No   Sexual activity: Not Currently  Other Topics Concern   Not on file  Social History Narrative   Not on file   Social Determinants of Health   Financial Resource Strain: Not on file  Food Insecurity: Not on file  Transportation Needs: Not on file  Physical Activity: Not on file  Stress: Not on file  Social Connections: Not on file     Family History: The patient's family history includes  Arthritis in her mother; Heart disease in her maternal grandmother; Hyperlipidemia in her mother; Hypertension in her maternal grandmother and mother; Mental illness in her mother.  ROS:   Please see the history of present illness.     All other systems reviewed and are negative.  EKGs/Labs/Other Studies Reviewed:    The following studies were reviewed today:  Echocardiogram 02/10/2019   IMPRESSIONS     1. There is akinesis of the left ventricular, mid-apical anterior wall,  apical segment and anteroseptal wall.   2. The left ventricle has mild-moderately reduced systolic function, with  an ejection fraction of 40-45%. The cavity size was normal. Left  ventricular diastolic Doppler parameters are consistent with impaired  relaxation.   3. The right ventricle has normal systolic function. The cavity was  normal. There is no increase in right ventricular wall thickness. Right  ventricular systolic pressure could not be assessed.   4. The aortic valve was not well visualized.   5. The interatrial septum was not well visualized.     Limited TTE (01/23/17): Dilated left ventricle with normal wall thickness. LVEF 35-40% with apical anterior, apical septal, and apical inferior hypokinesis. Mild to moderate MR. Mild left atrial enlargement. Normal RV size and function.   Transthoracic echocardiogram (11/06/16): LVEF  30-35% with severe hypokinesis of mid/apical septal, anterior, and lateral walls, as well as the apex. Moderate to severe MR. Mild left atrial enlargment. Moderate pulmonary hypertension.   Transthoracic echocardiogram (09/13/16): Normal LV size and contraction (EF 60-65%). Normal diastolic function. Trivial mitral and tricuspid regurgitation. Normal RV size and function.  EKG:  EKG is not ordered today.   Recent Labs: 11/24/2021: ALT 33; BUN 12; Creatinine, Ser 1.02; Potassium 4.1; Sodium 144  Recent Lipid Panel    Component Value Date/Time   CHOL 109 11/24/2021 0846   TRIG 87 11/24/2021 0846   HDL 59 11/24/2021 0846   CHOLHDL 1.8 11/24/2021 0846   CHOLHDL 3.9 02/10/2021 1030   VLDL 34 02/10/2021 1030   LDLCALC 33 11/24/2021 0846     Physical Exam:    VS:  BP 126/76 (BP Location: Left Arm, Patient Position: Sitting, Cuff Size: Normal)   Pulse 64   Ht 5\' 5"  (1.651 m)   Wt 178 lb 12.8 oz (81.1 kg)   SpO2 97%   BMI 29.75 kg/m     Wt Readings from Last 3 Encounters:  04/17/22 178 lb 12.8 oz (81.1 kg)  01/16/22 176 lb 4 oz (79.9 kg)  11/23/21 176 lb 12.8 oz (80.2 kg)     GEN:  Well nourished, well developed in no acute distress HEENT: Normal NECK: No JVD; No carotid bruits LYMPHATICS: No lymphadenopathy CARDIAC: RRR, no murmurs, rubs, gallops RESPIRATORY:  Clear to auscultation without rales, wheezing or rhonchi  ABDOMEN: Soft, non-tender, non-distended MUSCULOSKELETAL:  No edema; No deformity  SKIN: Warm and dry NEUROLOGIC:  Alert and oriented x 3 PSYCHIATRIC:  Normal affect   ASSESSMENT:    1. Coronary artery disease involving native coronary artery of native heart without angina pectoris   2. Chronic systolic heart failure (HCC)   3. Ischemic cardiomyopathy   4. Essential hypertension   5. Hyperlipidemia, mixed    PLAN:    In order of problems listed above:  CAD s/p PCI to LAD in 2017 The patient denies chest pain. No further work-up indicated at this time.  Continue Plavix 75mg  daily, Coreg 6.25mg BID, Crestor 40mg  daily,   HFrEF ICM LVEF 40-45% S/p ICD Echo in 2020  showed LVEF 40-45%, no repeat has been ordered. She is euvolemic on exam today. She takes lasix 20mg  as needed. Continue Core 6.25mg  BID and spironolactone 25mg  daily. We can re-check echo at follow-up and continue GDMT. ICD is followed by EP, most recent device check was normal.   HTN Dizziness She has occasional dizziness, but overall much improved symptoms off the lisinopril.   HLD LDL 33 11/2021. Continue Zetia and Crestor.   Disposition: Follow up in 3 month(s) with MD/APP      Signed, Hannibal Skalla , PA-C  04/17/2022 12:36 PM    [facility]

## 2022-05-11 ENCOUNTER — Ambulatory Visit (INDEPENDENT_AMBULATORY_CARE_PROVIDER_SITE_OTHER): Payer: Self-pay

## 2022-05-11 DIAGNOSIS — I5022 Chronic systolic (congestive) heart failure: Secondary | ICD-10-CM

## 2022-05-11 DIAGNOSIS — I255 Ischemic cardiomyopathy: Secondary | ICD-10-CM

## 2022-05-12 LAB — CUP PACEART REMOTE DEVICE CHECK
Battery Remaining Longevity: 65 mo
Battery Remaining Percentage: 62 %
Battery Voltage: 2.96 V
Brady Statistic RV Percent Paced: 1 %
Date Time Interrogation Session: 20230817114947
HighPow Impedance: 70 Ohm
HighPow Impedance: 70 Ohm
Implantable Lead Implant Date: 20180906
Implantable Lead Location: 753860
Implantable Pulse Generator Implant Date: 20180906
Lead Channel Impedance Value: 360 Ohm
Lead Channel Pacing Threshold Amplitude: 1 V
Lead Channel Pacing Threshold Pulse Width: 0.5 ms
Lead Channel Sensing Intrinsic Amplitude: 12 mV
Lead Channel Setting Pacing Amplitude: 2.5 V
Lead Channel Setting Pacing Pulse Width: 0.5 ms
Lead Channel Setting Sensing Sensitivity: 0.5 mV
Pulse Gen Serial Number: 9783396

## 2022-05-17 ENCOUNTER — Other Ambulatory Visit: Payer: Self-pay | Admitting: Family Medicine

## 2022-05-17 ENCOUNTER — Other Ambulatory Visit: Payer: Self-pay

## 2022-05-17 MED ORDER — ESCITALOPRAM OXALATE 10 MG PO TABS
10.0000 mg | ORAL_TABLET | Freq: Every day | ORAL | 0 refills | Status: DC
  Filled 2022-05-17 – 2022-06-02 (×2): qty 90, 90d supply, fill #0

## 2022-05-25 ENCOUNTER — Other Ambulatory Visit: Payer: Self-pay

## 2022-05-30 NOTE — Progress Notes (Signed)
Remote ICD transmission.   

## 2022-06-02 ENCOUNTER — Other Ambulatory Visit: Payer: Self-pay

## 2022-07-18 ENCOUNTER — Ambulatory Visit: Payer: Self-pay | Attending: Medical | Admitting: Medical

## 2022-07-18 NOTE — Progress Notes (Deleted)
Cardiology Office Note:    Date:  07/18/2022   ID:  METZTLI SACHDEV, DOB 02/22/1966, MRN [id number]  PCP:  Leone Haven, MD  [facility] Cardiologist:  Nelva Bush, MD  [facility] Electrophysiologist:  None   Referring MD: Leone Haven, MD   Chief Complaint: 80-month follow-up  History of Present Illness:    Crystal Roberson is a 56 y.o. female with a hx of coronary artery disease status post anterior STEMI in [id number]/8295, chronic systolic heart failure due to ischemic cardiomyopathy status post ICD (05/2016), hypertension with suspicion for hyperaldosteronism, migraine headaches, degenerative disc disease with chronic back pain, and depression who presents today for 20-month follow-up of her coronary artery disease and chronic HFrEF.     Previously she was seen for exertional dyspnea.  Her activity was limited to her chronic back pain.  At times her blood pressure readings were up to 621 mmHg systolic.  Her carvedilol was increased to 6.25 mg twice daily.  Her dose of lisinopril and spironolactone were continued with plans to obtain a CMP (this was never completed).   She was seen 10/03/21 and reported dizziness with change in position. Orthostatics were marginally positive. Lisinopril was stopped. Crestor was increased and Zetia was started. Lipid panel was re-ordered.   Last seen 04/17/2022 was okay, she reported episode of low blood pressure.  Family was under a lot of stress.  Today,  Echo GDT  Past Medical History:  Diagnosis Date   Allergy    CAD (coronary artery disease)    a. 08/2016 Ant STEMI/PCI: LAD 83m (2.25x20 Promus Premier DES); b. 04/2017 Cath: LM nl, LAD 20ost, 70ost->vasospasm, Patent mid stent, 50d, D1/2/3 small, LCX 20ost, 48m, OM1/2/3 small, RCA 49m, 20d, EF 30-35%, mild to mod MR.   Chicken pox    Chronic back pain    Chronic fatigue    DDD (degenerative disc disease), lumbar    Depression    HFrEF (heart failure with reduced ejection fraction) ([place])     a. 01/2017 Echo: Ef 35-40%, septal apical, distal inf, anterior HK. Mod dil LV. Mild to mod MR. mildly dil LA; b. 04/2017 LV Gram: EF 30-35%.   Hyperlipidemia LDL goal <70    Hypertension    Ischemic cardiomyopathy    a. 01/2017 Echo: EF 35-40%; b. 04/2017 LV Gram: EF 30-35%; c. 05/2017 s/p SJM 1411-36Q Ellipse VR RV only AICD.   Migraines    Syncope 09/12/2016    Past Surgical History:  Procedure Laterality Date   CARDIAC CATHETERIZATION N/A 09/23/2016   Procedure: Left Heart Cath and Coronary Angiography;  Surgeon: Nelva Bush, MD;  Location: [place] [facility];  Service: Cardiovascular;  Laterality: N/A;   CARDIAC CATHETERIZATION N/A 09/23/2016   Procedure: Coronary Stent Intervention;  Surgeon: Nelva Bush, MD;  Location: [place] [facility];  Service: Cardiovascular;  Laterality: N/A;   CERVICAL DISC SURGERY     ICD IMPLANT N/A 05/31/2017   Procedure: ICD Implant;  Surgeon: Constance Haw, MD;  Location: [place] [facility];  Service: Cardiovascular;  Laterality: N/A;   RIGHT/LEFT HEART CATH AND CORONARY ANGIOGRAPHY N/A 04/26/2017   Procedure: Right/Left Heart Cath and Coronary Angiography;  Surgeon: Nelva Bush, MD;  Location: [place] [facility];  Service: Cardiovascular;  Laterality: N/A;   TUBAL LIGATION     ULTRASOUND GUIDANCE FOR VASCULAR ACCESS  04/26/2017   Procedure: Ultrasound Guidance For Vascular Access;  Surgeon: Nelva Bush, MD;  Location: [place] [facility];  Service:  Cardiovascular;;    Current Medications: No outpatient medications have been marked as taking for the 07/18/22 encounter (Appointment) with Fransico Michael, Alecia Doi H, PA-C.     Allergies:   Pregabalin and Codeine   Social History   Socioeconomic History   Marital status: Divorced    Spouse name: Not on file   Number of children: Not on file   Years of education: Not on file   Highest education level: Not on file  Occupational History   Not on file  Tobacco Use   Smoking status: Former     Packs/day: 0.75    Years: 30.00    Total pack years: 22.50    Types: Cigarettes    Quit date: 09/23/2016    Years since quitting: 5.8   Smokeless tobacco: Never  Vaping Use   Vaping Use: Never used  Substance and Sexual Activity   Alcohol use: No    Alcohol/week: 0.0 standard drinks of alcohol   Drug use: No   Sexual activity: Not Currently  Other Topics Concern   Not on file  Social History Narrative   Not on file   Social Determinants of Health   Financial Resource Strain: Not on file  Food Insecurity: Not on file  Transportation Needs: Not on file  Physical Activity: Not on file  Stress: Not on file  Social Connections: Not on file     Family History: The patient's ***family history includes Arthritis in her mother; Heart disease in her maternal grandmother; Hyperlipidemia in her mother; Hypertension in her maternal grandmother and mother; Mental illness in her mother.  ROS:   Please see the history of present illness.    *** All other systems reviewed and are negative.  EKGs/Labs/Other Studies Reviewed:    The following studies were reviewed today: ***  EKG:  EKG is *** ordered today.  The ekg ordered today demonstrates ***  Recent Labs: 11/24/2021: ALT 33; BUN 12; Creatinine, Ser 1.02; Potassium 4.1; Sodium 144  Recent Lipid Panel    Component Value Date/Time   CHOL 109 11/24/2021 0846   TRIG 87 11/24/2021 0846   HDL 59 11/24/2021 0846   CHOLHDL 1.8 11/24/2021 0846   CHOLHDL 3.9 02/10/2021 1030   VLDL 34 02/10/2021 1030   LDLCALC 33 11/24/2021 0846     Risk Assessment/Calculations:   {Does this patient have ATRIAL FIBRILLATION?:[phone]}   Physical Exam:    VS:  There were no vitals taken for this visit.    Wt Readings from Last 3 Encounters:  04/17/22 178 lb 12.8 oz (81.1 kg)  01/16/22 176 lb 4 oz (79.9 kg)  11/23/21 176 lb 12.8 oz (80.2 kg)     GEN: *** Well nourished, well developed in no acute distress HEENT: Normal NECK: No JVD;  No carotid bruits LYMPHATICS: No lymphadenopathy CARDIAC: ***RRR, no murmurs, rubs, gallops RESPIRATORY:  Clear to auscultation without rales, wheezing or rhonchi  ABDOMEN: Soft, non-tender, non-distended MUSCULOSKELETAL:  No edema; No deformity  SKIN: Warm and dry NEUROLOGIC:  Alert and oriented x 3 PSYCHIATRIC:  Normal affect   ASSESSMENT:    No diagnosis found. PLAN:    In order of problems listed above:  ***  Disposition: Follow up {follow up:15908} with ***   Shared Decision Making/Informed Consent   {Are you ordering a CV Procedure (e.g. stress test, cath, DCCV, TEE, etc)?   Press F2        :[id number]}    Signed, Ajai Terhaar David Stall, PA-C  07/18/2022 7:49 AM  [facility]

## 2022-07-19 ENCOUNTER — Encounter: Payer: Self-pay | Admitting: Medical

## 2022-07-25 ENCOUNTER — Other Ambulatory Visit: Payer: Self-pay

## 2022-07-25 ENCOUNTER — Other Ambulatory Visit: Payer: Self-pay | Admitting: Family Medicine

## 2022-07-25 DIAGNOSIS — F331 Major depressive disorder, recurrent, moderate: Secondary | ICD-10-CM

## 2022-07-25 MED ORDER — BUPROPION HCL ER (XL) 150 MG PO TB24
150.0000 mg | ORAL_TABLET | Freq: Every day | ORAL | 0 refills | Status: DC
  Filled 2022-07-25: qty 90, 90d supply, fill #0

## 2022-08-10 ENCOUNTER — Ambulatory Visit (INDEPENDENT_AMBULATORY_CARE_PROVIDER_SITE_OTHER): Payer: Self-pay

## 2022-08-10 DIAGNOSIS — I255 Ischemic cardiomyopathy: Secondary | ICD-10-CM

## 2022-08-10 LAB — CUP PACEART REMOTE DEVICE CHECK
Battery Remaining Longevity: 62 mo
Battery Remaining Percentage: 60 %
Battery Voltage: 2.96 V
Brady Statistic RV Percent Paced: 1 %
Date Time Interrogation Session: 20231116020015
HighPow Impedance: 72 Ohm
HighPow Impedance: 72 Ohm
Implantable Lead Connection Status: 753985
Implantable Lead Implant Date: 20180906
Implantable Lead Location: 753860
Implantable Pulse Generator Implant Date: 20180906
Lead Channel Impedance Value: 360 Ohm
Lead Channel Pacing Threshold Amplitude: 1 V
Lead Channel Pacing Threshold Pulse Width: 0.5 ms
Lead Channel Sensing Intrinsic Amplitude: 12 mV
Lead Channel Setting Pacing Amplitude: 2.5 V
Lead Channel Setting Pacing Pulse Width: 0.5 ms
Lead Channel Setting Sensing Sensitivity: 0.5 mV
Pulse Gen Serial Number: 9783396

## 2022-08-21 ENCOUNTER — Other Ambulatory Visit: Payer: Self-pay

## 2022-08-21 ENCOUNTER — Other Ambulatory Visit (HOSPITAL_COMMUNITY): Payer: Self-pay

## 2022-08-30 NOTE — Progress Notes (Signed)
Remote ICD transmission.   

## 2022-09-06 ENCOUNTER — Telehealth: Payer: Self-pay | Admitting: Family Medicine

## 2022-09-06 MED ORDER — MOLNUPIRAVIR EUA 200MG CAPSULE: 4.0000 | ORAL_CAPSULE | Freq: Two times a day (BID) | ORAL | 0 refills | Status: AC

## 2022-09-06 NOTE — Telephone Encounter (Signed)
Patient called stating she tested positiive for Covid 19. She wants to know if there is something she should be taking to feel better.

## 2022-09-07 NOTE — Telephone Encounter (Signed)
Pt is aware.  

## 2022-10-17 ENCOUNTER — Other Ambulatory Visit: Payer: Self-pay | Admitting: Medical

## 2022-10-17 ENCOUNTER — Other Ambulatory Visit: Payer: Self-pay | Admitting: Family Medicine

## 2022-10-17 DIAGNOSIS — E785 Hyperlipidemia, unspecified: Secondary | ICD-10-CM

## 2022-10-17 DIAGNOSIS — F331 Major depressive disorder, recurrent, moderate: Secondary | ICD-10-CM

## 2022-10-18 ENCOUNTER — Other Ambulatory Visit: Payer: Self-pay

## 2022-10-18 MED ORDER — ESCITALOPRAM OXALATE 10 MG PO TABS
10.0000 mg | ORAL_TABLET | Freq: Every day | ORAL | 0 refills | Status: DC
  Filled 2022-10-18: qty 30, 30d supply, fill #0

## 2022-10-18 MED ORDER — BUPROPION HCL ER (XL) 150 MG PO TB24
150.0000 mg | ORAL_TABLET | Freq: Every day | ORAL | 0 refills | Status: DC
  Filled 2022-10-18: qty 30, 30d supply, fill #0

## 2022-10-19 ENCOUNTER — Other Ambulatory Visit: Payer: Self-pay

## 2022-10-19 MED ORDER — EZETIMIBE 10 MG PO TABS
10.0000 mg | ORAL_TABLET | Freq: Every day | ORAL | 1 refills | Status: DC
  Filled 2022-10-19: qty 30, 30d supply, fill #0
  Filled 2022-12-02: qty 30, 30d supply, fill #1

## 2022-10-20 ENCOUNTER — Other Ambulatory Visit: Payer: Self-pay

## 2022-11-09 ENCOUNTER — Ambulatory Visit (INDEPENDENT_AMBULATORY_CARE_PROVIDER_SITE_OTHER): Payer: Self-pay

## 2022-11-09 DIAGNOSIS — I255 Ischemic cardiomyopathy: Secondary | ICD-10-CM

## 2022-11-12 LAB — CUP PACEART REMOTE DEVICE CHECK
Battery Remaining Longevity: 61 mo
Battery Remaining Percentage: 58 %
Battery Voltage: 2.96 V
Brady Statistic RV Percent Paced: 1 %
Date Time Interrogation Session: 20240217135356
HighPow Impedance: 77 Ohm
HighPow Impedance: 77 Ohm
Implantable Lead Connection Status: 753985
Implantable Lead Implant Date: 20180906
Implantable Lead Location: 753860
Implantable Pulse Generator Implant Date: 20180906
Lead Channel Impedance Value: 390 Ohm
Lead Channel Pacing Threshold Amplitude: 1 V
Lead Channel Pacing Threshold Pulse Width: 0.5 ms
Lead Channel Sensing Intrinsic Amplitude: 12 mV
Lead Channel Setting Pacing Amplitude: 2.5 V
Lead Channel Setting Pacing Pulse Width: 0.5 ms
Lead Channel Setting Sensing Sensitivity: 0.5 mV
Pulse Gen Serial Number: 9783396

## 2022-11-14 ENCOUNTER — Telehealth (INDEPENDENT_AMBULATORY_CARE_PROVIDER_SITE_OTHER): Payer: Self-pay | Admitting: Nurse Practitioner

## 2022-11-14 ENCOUNTER — Encounter: Payer: Self-pay | Admitting: Nurse Practitioner

## 2022-11-14 VITALS — BP 130/80 | Ht 65.0 in | Wt 170.0 lb

## 2022-11-14 DIAGNOSIS — R6889 Other general symptoms and signs: Secondary | ICD-10-CM

## 2022-11-14 DIAGNOSIS — Z1152 Encounter for screening for COVID-19: Secondary | ICD-10-CM

## 2022-11-14 DIAGNOSIS — U071 COVID-19: Secondary | ICD-10-CM

## 2022-11-14 LAB — POCT INFLUENZA A/B
Influenza A, POC: NEGATIVE
Influenza B, POC: NEGATIVE

## 2022-11-14 LAB — POC COVID19 BINAXNOW: SARS Coronavirus 2 Ag: POSITIVE — AB

## 2022-11-14 MED ORDER — MOLNUPIRAVIR EUA 200MG CAPSULE: 4.0000 | ORAL_CAPSULE | Freq: Two times a day (BID) | ORAL | 0 refills | Status: AC

## 2022-11-14 NOTE — Progress Notes (Signed)
MyChart Video Visit    Virtual Visit via Video Note   This visit type was conducted due to national recommendations for restrictions regarding the COVID-19 Pandemic (e.g. social distancing) in an effort to limit this patient's exposure and mitigate transmission in our community. This patient is at least at moderate risk for complications without adequate follow up. This format is felt to be most appropriate for this patient at this time. Physical exam was limited by quality of the video and audio technology used for the visit. CMA was able to get the patient set up on a video visit.  Patient location: Car. Parked in parking lot outside of office. Patient and provider in visit Provider location: Office  I discussed the limitations of evaluation and management by telemedicine and the availability of in person appointments. The patient expressed understanding and agreed to proceed.  Visit Date: 11/14/2022  Today's healthcare provider: Tomasita Morrow, NP     Subjective:    Patient ID: Crystal Roberson, female    DOB: [birth year], 57 y.o.   MRN: [id number]  Chief Complaint  Patient presents with   Sore Throat    Nasal drainage, ear full, cough    HPI Patient reports symptoms starting Saturday 11/11/2022 with no improvement. She has been recently exposed to [place] and Flu.   Respiratory illness:  Cough- Yes  Congestion-    Sinus- Yes, nasal and sinus pressure   Chest- Yes, worse at night  Post nasal drip- Yes  Sore throat- Yes  Shortness of breath- No  Fever- No  Fatigue/Myalgia- Yes Headache- Yes Nausea/Vomiting- No Taste disturbance- Yes  Smell disturbance- No  Covid exposure- Yes  Covid vaccination- x 3  Flu vaccination- No  Medications- Robitussin  Interactive audio and video telecommunications were attempted between this provider and patient, however failed, due to patient having technical difficulties OR patient did not have access to video capability.  We continued and  completed visit with audio only.   Past Medical History:  Diagnosis Date   Allergy    CAD (coronary artery disease)    a. 08/2016 Ant STEMI/PCI: LAD 15m(2.25x20 Promus Premier DES); b. 04/2017 Cath: LM nl, LAD 20ost, 70ost->vasospasm, Patent mid stent, 50d, D1/2/3 small, LCX 20ost, 328mOM1/2/3 small, RCA 4020m0d, EF 30-35%, mild to mod MR.   Chicken pox    Chronic back pain    Chronic fatigue    DDD (degenerative disc disease), lumbar    Depression    HFrEF (heart failure with reduced ejection fraction) ([place]  a. 01/2017 Echo: Ef 35-40%, septal apical, distal inf, anterior HK. Mod dil LV. Mild to mod MR. mildly dil LA; b. 04/2017 LV Gram: EF 30-35%.   Hyperlipidemia LDL goal <70    Hypertension    Ischemic cardiomyopathy    a. 01/2017 Echo: EF 35-40%; b. 04/2017 LV Gram: EF 30-35%; c. 05/2017 s/p SJM 1411-36Q Ellipse VR RV only AICD.   Migraines    Syncope 09/12/2016    Past Surgical History:  Procedure Laterality Date   CARDIAC CATHETERIZATION N/A 09/23/2016   Procedure: Left Heart Cath and Coronary Angiography;  Surgeon: ChrNelva BushD;  Location: [facility] [place] [facility];  Service: Cardiovascular;  Laterality: N/A;   CARDIAC CATHETERIZATION N/A 09/23/2016   Procedure: Coronary Stent Intervention;  Surgeon: ChrNelva BushD;  Location: [facility] [place] [facility];  Service: Cardiovascular;  Laterality: N/A;   CERVICAL DISC SURGERY     ICD IMPLANT N/A 05/31/2017   Procedure: ICD  Implant;  Surgeon: Constance Haw, MD;  Location: [place] [facility];  Service: Cardiovascular;  Laterality: N/A;   RIGHT/LEFT HEART CATH AND CORONARY ANGIOGRAPHY N/A 04/26/2017   Procedure: Right/Left Heart Cath and Coronary Angiography;  Surgeon: Nelva Bush, MD;  Location: [place] [facility];  Service: Cardiovascular;  Laterality: N/A;   TUBAL LIGATION     ULTRASOUND GUIDANCE FOR VASCULAR ACCESS  04/26/2017   Procedure: Ultrasound Guidance For Vascular Access;  Surgeon: Nelva Bush, MD;  Location: [place] [facility];  Service: Cardiovascular;;    Family History  Problem Relation Age of Onset   Hypertension Mother    Arthritis Mother    Hyperlipidemia Mother    Mental illness Mother    Hypertension Maternal Grandmother    Heart disease Maternal Grandmother     Social History   Socioeconomic History   Marital status: Divorced    Spouse name: Not on file   Number of children: Not on file   Years of education: Not on file   Highest education level: Not on file  Occupational History   Not on file  Tobacco Use   Smoking status: Former    Packs/day: 0.75    Years: 30.00    Total pack years: 22.50    Types: Cigarettes    Quit date: 09/23/2016    Years since quitting: 6.1   Smokeless tobacco: Never  Vaping Use   Vaping Use: Never used  Substance and Sexual Activity   Alcohol use: No    Alcohol/week: 0.0 standard drinks of alcohol   Drug use: No   Sexual activity: Not Currently  Other Topics Concern   Not on file  Social History Narrative   Not on file   Social Determinants of Health   Financial Resource Strain: Not on file  Food Insecurity: Not on file  Transportation Needs: Not on file  Physical Activity: Not on file  Stress: Not on file  Social Connections: Not on file  Intimate Partner Violence: Not on file    Outpatient Medications Prior to Visit  Medication Sig Dispense Refill   buPROPion (WELLBUTRIN XL) 150 MG 24 hr tablet Take 1 tablet (150 mg total) by mouth once daily. 30 tablet 0   carvedilol (COREG) 6.25 MG tablet Take 1 tablet (6.25 mg total) by mouth 2 (two) times daily. 180 tablet 2   clopidogrel (PLAVIX) 75 MG tablet Take 1 tablet (75 mg total) by mouth once daily. 90 tablet 3   escitalopram (LEXAPRO) 10 MG tablet Take 1 tablet (10 mg total) by mouth once daily. 30 tablet 0   ezetimibe (ZETIA) 10 MG tablet Take 1 tablet (10 mg total) by mouth daily. Please schedule appointment for further refills 30 tablet 1   hydrOXYzine (ATARAX) 10 MG tablet  Take 1 tablet (10 mg total) by mouth every 8 (eight) hours as needed for anxiety. 30 tablet 0   lidocaine (LIDODERM) 5 % Place 2-3 patches onto the skin every 12 (twelve) hours as needed (pain). Remove & Discard patch within 12 hours or as directed by MD     morphine (MS CONTIN) 15 MG 12 hr tablet Take 15 mg by mouth every 8 (eight) hours.      nitroGLYCERIN (NITROSTAT) 0.4 MG SL tablet Dissolve 1 tablet (0.4 mg total) under the tongue once every 5 (five) minutes as needed for chest pain. Max of 3 doses in 15 minutes. 25 tablet 0   potassium chloride SA (KLOR-CON M) 20 MEQ tablet TAKE  1 TABLET BY MOUTH ONCE DAILY. (IF YOU TAKE LASIX TAKE AN EXTRA DOSE AS DIRECTED) 90 tablet 3   rosuvastatin (CRESTOR) 40 MG tablet Take 1 tablet (40 mg total) by mouth once daily. 90 tablet 3   spironolactone (ALDACTONE) 25 MG tablet Take (1/2) tablet (12.5 mg total) by mouth 2 (two) times daily as needed. 90 tablet 3   tiZANidine (ZANAFLEX) 4 MG tablet Take 4-8 mg by mouth daily.     furosemide (LASIX) 20 MG tablet Take 1 tablet (20 mg total) by mouth once daily as needed. (Patient not taking: Reported on 11/14/2022) 30 tablet 0   No facility-administered medications prior to visit.    Allergies  Allergen Reactions   Pregabalin Shortness Of Breath, Swelling, Palpitations and Other (See Comments)    Other reaction(s): Tachycardia (finding)   Codeine Nausea And Vomiting    ROS See HPI     Objective:    Physical Exam  BP 130/80 Comment: Pt reported  Ht 5' 5"$  (1.651 m)   Wt 170 lb (77.1 kg) Comment: Pt reported  BMI 28.29 kg/m  Wt Readings from Last 3 Encounters:  11/14/22 170 lb (77.1 kg)  04/17/22 178 lb 12.8 oz (81.1 kg)  01/16/22 176 lb 4 oz (79.9 kg)   Visit occurred via audio only.   GENERAL: alert, oriented, does not sound to be in acute distress   LUNGS: no audible SOB, gasping or wheezing   PSYCH/NEURO: pleasant and cooperative, speech and thought processing grossly intact      Assessment & Plan:   Problem List Items Addressed This Visit       Other   COVID-19 - Primary    COVID test positive in office. Patient interested in treatment with Molnupiravir as she has been treated with it before for a prior case of COVID. Rx sent to pharmacy. Counseled on side effects. Counseled on quarantine protocol. Advised adequate fluid intake. She can continue Tylenol PRN and Robitussin.       Relevant Medications   molnupiravir EUA (LAGEVRIO) 200 mg CAPS capsule   Other Visit Diagnoses     Flu-like symptoms       Relevant Orders   POCT Influenza A/B (Completed)   Encounter for screening for COVID-19       Relevant Orders   POC COVID-19 BinaxNow (Completed)       I am having Crystal Roberson start on molnupiravir EUA. I am also having her maintain her morphine, lidocaine, tiZANidine, hydrOXYzine, nitroGLYCERIN, furosemide, spironolactone, clopidogrel, rosuvastatin, carvedilol, potassium chloride SA, ezetimibe, escitalopram, and buPROPion.  Meds ordered this encounter  Medications   molnupiravir EUA (LAGEVRIO) 200 mg CAPS capsule    Sig: Take 4 capsules (800 mg total) by mouth 2 (two) times daily for 5 days.    Dispense:  40 capsule    Refill:  0    Order Specific Question:   Supervising Provider    Answer:   Caryl Bis, ERIC G [[id number]]    I discussed the assessment and treatment plan with the patient. The patient was provided an opportunity to ask questions and all were answered. The patient agreed with the plan and demonstrated an understanding of the instructions.   The patient was advised to call back or seek an in-person evaluation if the symptoms worsen or if the condition fails to improve as anticipated.   Tomasita Morrow, NP [place] [facility] [phone] (phone) [phone] (fax)  [place]

## 2022-11-14 NOTE — Assessment & Plan Note (Addendum)
COVID test positive in office. Patient interested in treatment with Molnupiravir as she has been treated with it before for a prior case of COVID. Rx sent to pharmacy. Counseled on side effects. Counseled on quarantine protocol. Advised adequate fluid intake. She can continue Tylenol PRN and Robitussin.

## 2022-12-01 ENCOUNTER — Telehealth: Payer: Self-pay

## 2022-12-01 ENCOUNTER — Ambulatory Visit: Payer: Self-pay | Admitting: Family

## 2022-12-01 ENCOUNTER — Ambulatory Visit: Payer: Self-pay | Admitting: Family Medicine

## 2022-12-01 ENCOUNTER — Ambulatory Visit (INDEPENDENT_AMBULATORY_CARE_PROVIDER_SITE_OTHER): Payer: Self-pay

## 2022-12-01 ENCOUNTER — Ambulatory Visit
Admission: EM | Admit: 2022-12-01 | Discharge: 2022-12-01 | Disposition: A | Discharge status: Home / Self Care | Payer: Self-pay | Attending: Emergency Medicine | Admitting: Emergency Medicine

## 2022-12-01 DIAGNOSIS — R059 Cough, unspecified: Secondary | ICD-10-CM

## 2022-12-01 DIAGNOSIS — R079 Chest pain, unspecified: Secondary | ICD-10-CM

## 2022-12-01 DIAGNOSIS — R0982 Postnasal drip: Secondary | ICD-10-CM

## 2022-12-01 DIAGNOSIS — J302 Other seasonal allergic rhinitis: Secondary | ICD-10-CM

## 2022-12-01 MED ORDER — PREDNISONE 10 MG PO TABS: 40.0000 mg | ORAL_TABLET | Freq: Every day | ORAL | 0 refills | Status: AC

## 2022-12-01 MED ORDER — BENZONATATE 100 MG PO CAPS: 100.0000 mg | ORAL_CAPSULE | Freq: Three times a day (TID) | ORAL | 0 refills | Status: DC | PRN

## 2022-12-01 NOTE — Telephone Encounter (Signed)
At 12:05p pt had not arrived for appointment, writer called and pt reported that she was not far. At 12:12p pt still had not arrived, called her back and she had just walked into lobby.  I explained that she had missed her appointment and that we had no other appointments available today but offered to set her up at Urgent Care.  Next available appointment was 2:30p, pt reported that she could not wait until that time due to needing to pick up her dog from surgery.  Per website urgent care has 13 minute wait time.  Pt agreeable for writer to place pt on their waiting list.  She plans to head there now from office.

## 2022-12-01 NOTE — ED Provider Notes (Signed)
Crystal Roberson    CSN: [id number] Arrival date & time: 12/01/22  1222      History   Chief Complaint Chief Complaint  Patient presents with   Cough    Entered by patient    HPI Crystal Roberson is a 57 y.o. female.  Patient presents with 2 month history of cough since having COVID.  She reports chest congestion and chest pain when coughing.  The chest pain occurs only with cough.  Treatment attempted with OTC cough syrup.  She denies fever, chills, shortness of breath, or other symptoms.  Patient tested COVID positive at home on 09/06/2022; treated with molnupiravir.  She tested positive again on 11/14/2022.  Her medical history includes allergies, ischemic cardiomyopathy, CAD, hypertension, chronic systolic heart failure, hyperlipidemia, chronic fatigue, DDD, chronic back pain.    The history is provided by the patient and medical records.    Past Medical History:  Diagnosis Date   Allergy    CAD (coronary artery disease)    a. 08/2016 Ant STEMI/PCI: LAD 34m(2.25x20 Promus Premier DES); b. 04/2017 Cath: LM nl, LAD 20ost, 70ost->vasospasm, Patent mid stent, 50d, D1/2/3 small, LCX 20ost, 335mOM1/2/3 small, RCA 4033m0d, EF 30-35%, mild to mod MR.   Chicken pox    Chronic back pain    Chronic fatigue    DDD (degenerative disc disease), lumbar    Depression    HFrEF (heart failure with reduced ejection fraction) ([place]CC[place]  a. 01/2017 Echo: Ef 35-40%, septal apical, distal inf, anterior HK. Mod dil LV. Mild to mod MR. mildly dil LA; b. 04/2017 LV Gram: EF 30-35%.   Hyperlipidemia LDL goal <70    Hypertension    Ischemic cardiomyopathy    a. 01/2017 Echo: EF 35-40%; b. 04/2017 LV Gram: EF 30-35%; c. 05/2017 s/p SJM 1411-36Q Ellipse VR RV only AICD.   Migraines    Syncope 09/12/2016    Patient Active Problem List   Diagnosis Date Noted   COVID-19 11/14/2022   Allergic rhinitis 11/23/2021   Stress incontinence 11/23/2021   Myalgia 02/25/2021   Vasovagal episode 02/25/2021    Constipation 02/25/2021   Labile hypertension 02/10/2021   Elevated alkaline phosphatase level 02/24/2019   Chest pain 02/10/2019   Chronic fatigue 12/19/2017   Hyperaldosteronism ([facility]CC[place]. 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   DDD (degenerative disc disease), lumbosacral 01/28/2015   Intercostal neuralgia 01/28/2015    Past Surgical History:  Procedure Laterality Date   CARDIAC CATHETERIZATION N/A 09/23/2016   Procedure: Left Heart Cath and Coronary Angiography;  Surgeon: ChrNelva BushD;  Location: [facility];  Service: Cardiovascular;  Laterality: N/A;   CARDIAC CATHETERIZATION N/A 09/23/2016   Procedure: Coronary Stent Intervention;  Surgeon: ChrNelva BushD;  Location: [facility];  Service: Cardiovascular;  Laterality: N/A;   CERVICAL DISC SURGERY     ICD IMPLANT N/A 05/31/2017   Procedure: ICD Implant;  Surgeon: CamConstance HawD;  Location: [facility];  Service: Cardiovascular;  Laterality: N/A;   RIGHT/LEFT HEART CATH AND  CORONARY ANGIOGRAPHY N/A 04/26/2017   Procedure: Right/Left Heart Cath and Coronary Angiography;  Surgeon: Nelva Bush, MD;  Location: [facility];  Service: Cardiovascular;  Laterality: N/A;   TUBAL LIGATION     ULTRASOUND GUIDANCE FOR VASCULAR ACCESS  04/26/2017   Procedure: Ultrasound Guidance For Vascular Access;  Surgeon: Nelva Bush, MD;  Location: [facility];  Service: Cardiovascular;;    OB History   No obstetric history on file.       Home Medications    Prior to Admission medications   Medication Sig Start Date End Date Taking? Authorizing Provider  benzonatate (TESSALON) 100 MG capsule Take 1 capsule (100 mg total) by mouth 3 (three) times daily as needed for cough. 12/01/22  Yes Sharion Balloon, NP  predniSONE (DELTASONE) 10 MG tablet Take 4 tablets (40 mg total) by mouth daily for 3 days. 12/01/22 12/04/22 Yes Sharion Balloon, NP  buPROPion (WELLBUTRIN XL) 150 MG 24 hr tablet Take 1 tablet (150 mg total) by mouth once daily. 10/18/22   Leone Haven, MD  carvedilol (COREG) 6.25 MG tablet Take 1 tablet (6.25 mg total) by mouth 2 (two) times daily. 04/03/22 01/18/23  End, Harrell Gave, MD  clopidogrel (PLAVIX) 75 MG tablet Take 1 tablet (75 mg total) by mouth once daily. 04/03/22 01/24/23  End, Harrell Gave, MD  escitalopram (LEXAPRO) 10 MG tablet Take 1 tablet (10 mg total) by mouth once daily. 10/18/22   Leone Haven, MD  ezetimibe (ZETIA) 10 MG tablet Take 1 tablet (10 mg total) by mouth daily. Please schedule appointment for further refills 10/19/22   Furth, Cadence H, PA-C  furosemide (LASIX) 20 MG tablet Take 1 tablet (20 mg total) by mouth once daily as needed. Patient not taking: Reported on 11/14/2022 11/23/21   Leone Haven, MD  hydrOXYzine (ATARAX) 10 MG tablet Take 1 tablet (10 mg total) by mouth every 8 (eight) hours as needed for anxiety. 08/30/21   Leone Haven, MD  lidocaine (LIDODERM) 5 % Place 2-3 patches onto the skin every 12 (twelve) hours as needed (pain). Remove & Discard patch within 12 hours or as directed by MD    [provider]  morphine (MS CONTIN) 15 MG 12 hr tablet Take 15 mg by mouth every 8 (eight) hours.     [provider]  nitroGLYCERIN (NITROSTAT) 0.4 MG SL tablet Dissolve 1 tablet (0.4 mg total) under the tongue once every 5 (five) minutes as needed for chest pain. Max of 3 doses in 15 minutes. 11/18/21   End, Harrell Gave, MD  potassium chloride SA (KLOR-CON M) 20  MEQ tablet TAKE 1 TABLET BY MOUTH ONCE DAILY. (IF YOU TAKE LASIX TAKE AN EXTRA DOSE AS DIRECTED) 04/03/22   End, Harrell Gave, MD  rosuvastatin (CRESTOR) 40 MG tablet Take 1 tablet (40 mg total) by mouth once daily. 04/03/22 01/24/23  End, Harrell Gave, MD  spironolactone (ALDACTONE) 25 MG tablet Take (1/2) tablet (12.5 mg total) by mouth 2 (two) times daily as needed. 04/03/22   End, Harrell Gave, MD  tiZANidine (ZANAFLEX) 4 MG tablet Take 4-8 mg by mouth daily.    [provider]    Family History Family History  Problem Relation Age of Onset   Hypertension Mother    Arthritis Mother    Hyperlipidemia Mother    Mental illness Mother    Hypertension Maternal Grandmother    Heart disease Maternal Grandmother     Social History Social History   Tobacco Use  Smoking status: Former    Packs/day: 0.75    Years: 30.00    Total pack years: 22.50    Types: Cigarettes    Quit date: 09/23/2016    Years since quitting: 6.1   Smokeless tobacco: Never  Vaping Use   Vaping Use: Never used  Substance Use Topics   Alcohol use: No    Alcohol/week: 0.0 standard drinks of alcohol   Drug use: No     Allergies   Pregabalin and Codeine   Review of Systems Review of Systems  Constitutional:  Positive for fatigue. Negative for chills and fever.  HENT:  Positive for congestion and postnasal drip. Negative for ear pain and sore throat.   Respiratory:  Positive for cough. Negative for shortness of breath.   Cardiovascular:  Positive for chest pain. Negative for palpitations.  Gastrointestinal:  Negative for diarrhea and vomiting.  Skin:  Negative for color change and rash.  All other systems reviewed and are negative.    Physical Exam Triage Vital Signs ED Triage Vitals  Enc Vitals Group     BP 12/01/22 1242 (!) 147/85     Pulse Rate 12/01/22 1231 (!) 101     Resp 12/01/22 1231 18     Temp 12/01/22 1231 98.7 F (37.1 C)     Temp src --      SpO2 12/01/22 1231 95 %     Weight  --      Height --      Head Circumference --      Peak Flow --      Pain Score 12/01/22 1236 1     Pain Loc --      Pain Edu? --      Excl. in [place]? --    No data found.  Updated Vital Signs BP (!) 147/85   Pulse (!) 101   Temp 98.7 F (37.1 C)   Resp 18   SpO2 95%   Visual Acuity Right Eye Distance:   Left Eye Distance:   Bilateral Distance:    Right Eye Near:   Left Eye Near:    Bilateral Near:     Physical Exam Vitals and nursing note reviewed.  Constitutional:      General: She is not in acute distress.    Appearance: Normal appearance. She is well-developed. She is not ill-appearing.  HENT:     Right Ear: Tympanic membrane normal.     Left Ear: Tympanic membrane normal.     Nose: Nose normal.     Mouth/Throat:     Mouth: Mucous membranes are moist.     Pharynx: Oropharynx is clear.     Comments: Clear PND.  Cardiovascular:     Rate and Rhythm: Normal rate and regular rhythm.     Heart sounds: Normal heart sounds.  Pulmonary:     Effort: Pulmonary effort is normal. No respiratory distress.     Breath sounds: Normal breath sounds. No wheezing, rhonchi or rales.     Comments: Frequent nonproductive cough. Musculoskeletal:     Cervical back: Neck supple.  Skin:    General: Skin is warm and dry.  Neurological:     Mental Status: She is alert.  Psychiatric:        Mood and Affect: Mood normal.        Behavior: Behavior normal.      UC Treatments / Results  Labs (all labs ordered are listed, but only abnormal results are displayed) Labs Reviewed -  No data to display  EKG   Radiology DG Chest 2 View  Result Date: 12/01/2022 CLINICAL DATA:  Cough EXAM: CHEST - 2 VIEW COMPARISON:  02/10/2019 FINDINGS: Cardiac size is within normal limits. There are no signs of pulmonary edema or focal pulmonary consolidation. There is no pleural effusion or pneumothorax. Pacemaker/defibrillator battery is seen in the left infraclavicular region. There is previous  surgical fusion in lower cervical spine. IMPRESSION: There are no signs of pulmonary edema or focal pulmonary consolidation. Electronically Signed   By: Elmer Picker M.D.   On: 12/01/2022 13:06    Procedures Procedures (including critical care time)  Medications Ordered in UC Medications - No data to display  Initial Impression / Assessment and Plan / UC Course  I have reviewed the triage vital signs and the nursing notes.  Pertinent labs & imaging results that were available during my care of the patient were reviewed by me and considered in my medical decision making (see chart for details).    Cough, nonspecific chest pain, seasonal allergies, postnasal drip.  Patient has had a nagging cough since she had COVID in December.  Her chest pain occurs only with coughing episodes.  Chest x-ray negative.  EKG shows sinus rhythm, rate 86, no ST elevation, compared to previous from 01/16/2022.  Treating today with Tessalon Perles and 3-day course of prednisone 40 mg daily.  Discussed Flonase nasal spray and Zyrtec daily.  Instructed her to follow-up with her PCP next week.  ED precautions discussed.  Education provided on cough, chest pain, allergic rhinitis.  Patient agrees to plan of care.  Final Clinical Impressions(s) / UC Diagnoses   Final diagnoses:  Cough, unspecified type  Chest pain, unspecified type  Seasonal allergies  Postnasal drip     Discharge Instructions      Take the prednisone and Tessalon Perles as directed.    Take Flonase nasal spray and Zyrtec as directed.    Follow up with your primary care provider.    Go to the emergency department if you have chest pain or other concerning symptoms.          ED Prescriptions     Medication Sig Dispense Auth. Provider   benzonatate (TESSALON) 100 MG capsule Take 1 capsule (100 mg total) by mouth 3 (three) times daily as needed for cough. 21 capsule Sharion Balloon, NP   predniSONE (DELTASONE) 10 MG tablet Take 4  tablets (40 mg total) by mouth daily for 3 days. 12 tablet Sharion Balloon, NP      PDMP not reviewed this encounter.   Sharion Balloon, NP 12/01/22 1332

## 2022-12-01 NOTE — ED Triage Notes (Signed)
Patient to Urgent Care with complaints of dry cough/ chest congestion/ nasal drainage.  States she was Covid positive four weeks ago and has had lingering symptoms since. Denies any recent fevers.  Has been taking robitussin w/honey.

## 2022-12-01 NOTE — Discharge Instructions (Addendum)
Take the prednisone and Tessalon Perles as directed.    Take Flonase nasal spray and Zyrtec as directed.    Follow up with your primary care provider.    Go to the emergency department if you have chest pain or other concerning symptoms.

## 2022-12-02 ENCOUNTER — Other Ambulatory Visit: Payer: Self-pay | Admitting: Family Medicine

## 2022-12-02 DIAGNOSIS — F331 Major depressive disorder, recurrent, moderate: Secondary | ICD-10-CM

## 2022-12-03 ENCOUNTER — Other Ambulatory Visit: Payer: Self-pay

## 2022-12-04 ENCOUNTER — Other Ambulatory Visit: Payer: Self-pay

## 2022-12-04 MED ORDER — ESCITALOPRAM OXALATE 10 MG PO TABS
10.0000 mg | ORAL_TABLET | Freq: Every day | ORAL | 0 refills | Status: DC
  Filled 2022-12-04: qty 30, 30d supply, fill #0

## 2022-12-04 MED ORDER — BUPROPION HCL ER (XL) 150 MG PO TB24
150.0000 mg | ORAL_TABLET | Freq: Every day | ORAL | 0 refills | Status: DC
  Filled 2022-12-04: qty 30, 30d supply, fill #0

## 2022-12-06 NOTE — Progress Notes (Signed)
Remote ICD transmission.   

## 2022-12-13 ENCOUNTER — Other Ambulatory Visit: Payer: Self-pay

## 2023-01-07 ENCOUNTER — Other Ambulatory Visit: Payer: Self-pay | Admitting: Family Medicine

## 2023-01-07 ENCOUNTER — Other Ambulatory Visit: Payer: Self-pay | Admitting: Medical

## 2023-01-07 DIAGNOSIS — F331 Major depressive disorder, recurrent, moderate: Secondary | ICD-10-CM

## 2023-01-07 DIAGNOSIS — E785 Hyperlipidemia, unspecified: Secondary | ICD-10-CM

## 2023-01-09 ENCOUNTER — Other Ambulatory Visit: Payer: Self-pay | Admitting: Family Medicine

## 2023-01-09 ENCOUNTER — Other Ambulatory Visit: Payer: Self-pay

## 2023-01-09 DIAGNOSIS — E785 Hyperlipidemia, unspecified: Secondary | ICD-10-CM

## 2023-01-09 MED ORDER — ESCITALOPRAM OXALATE 10 MG PO TABS
10.0000 mg | ORAL_TABLET | Freq: Every day | ORAL | 0 refills | Status: DC
  Filled 2023-01-09: qty 30, 30d supply, fill #0

## 2023-01-09 MED ORDER — BUPROPION HCL ER (XL) 150 MG PO TB24
150.0000 mg | ORAL_TABLET | Freq: Every day | ORAL | 0 refills | Status: DC
  Filled 2023-01-09: qty 30, 30d supply, fill #0

## 2023-01-09 NOTE — Telephone Encounter (Signed)
Spoke with pt to schedule her an appt since she has not been seen in over a year. Medications have been refilled for 30 days only.

## 2023-01-12 ENCOUNTER — Other Ambulatory Visit: Payer: Self-pay

## 2023-01-17 ENCOUNTER — Other Ambulatory Visit: Payer: Self-pay | Admitting: Family Medicine

## 2023-01-22 NOTE — Telephone Encounter (Signed)
Patient has not been seen in 3 years. I will refill once she comes in to the office for her visit.

## 2023-01-22 NOTE — Telephone Encounter (Signed)
Last office visit 12/13/19 Next office visit 01/31/23

## 2023-01-23 NOTE — Telephone Encounter (Signed)
Pt called back in regards of previous message. Refills on hydrOXYzine (ATARAX) 10 MG tablet to be sent to [facility]. Dr. Birdie Sons saw pt last time on 11/23/2021 not 3 years ago.

## 2023-01-24 ENCOUNTER — Other Ambulatory Visit: Payer: Self-pay

## 2023-01-25 ENCOUNTER — Other Ambulatory Visit: Payer: Self-pay

## 2023-01-31 ENCOUNTER — Ambulatory Visit (INDEPENDENT_AMBULATORY_CARE_PROVIDER_SITE_OTHER): Payer: Self-pay | Admitting: Family Medicine

## 2023-01-31 ENCOUNTER — Encounter: Payer: Self-pay | Admitting: Family Medicine

## 2023-01-31 VITALS — BP 136/86 | HR 80 | Temp 98.4°F | Ht 65.0 in | Wt 184.5 lb

## 2023-01-31 DIAGNOSIS — R52 Pain, unspecified: Secondary | ICD-10-CM | POA: Insufficient documentation

## 2023-01-31 DIAGNOSIS — R519 Headache, unspecified: Secondary | ICD-10-CM

## 2023-01-31 DIAGNOSIS — E785 Hyperlipidemia, unspecified: Secondary | ICD-10-CM

## 2023-01-31 DIAGNOSIS — I10 Essential (primary) hypertension: Secondary | ICD-10-CM

## 2023-01-31 DIAGNOSIS — F419 Anxiety disorder, unspecified: Secondary | ICD-10-CM

## 2023-01-31 DIAGNOSIS — I5022 Chronic systolic (congestive) heart failure: Secondary | ICD-10-CM

## 2023-01-31 DIAGNOSIS — F331 Major depressive disorder, recurrent, moderate: Secondary | ICD-10-CM

## 2023-01-31 LAB — LIPID PANEL
Cholesterol: 129 mg/dL (ref 0–200)
HDL: 59 mg/dL (ref >39.00)
LDL Cholesterol: 55 mg/dL (ref 0–99)
NonHDL: 70.44
Total CHOL/HDL Ratio: 2
Triglycerides: 78 mg/dL (ref 0.0–149.0)
VLDL: 15.6 mg/dL (ref 0.0–40.0)

## 2023-01-31 LAB — COMPREHENSIVE METABOLIC PANEL
ALT: 26 U/L (ref 0–35)
AST: 37 U/L (ref 0–37)
Albumin: 4.2 g/dL (ref 3.5–5.2)
Alkaline Phosphatase: 116 U/L (ref 39–117)
BUN: 12 mg/dL (ref 6–23)
CO2: 28 mEq/L (ref 19–32)
Calcium: 9.2 mg/dL (ref 8.4–10.5)
Chloride: 105 mEq/L (ref 96–112)
Creatinine, Ser: 0.96 mg/dL (ref 0.40–1.20)
GFR: 64.87 mL/min (ref >60.00)
Glucose, Bld: 120 mg/dL — ABNORMAL HIGH (ref 70–99)
Potassium: 3.8 mEq/L (ref 3.5–5.1)
Sodium: 140 mEq/L (ref 135–145)
Total Bilirubin: 0.9 mg/dL (ref 0.2–1.2)
Total Protein: 7 g/dL (ref 6.0–8.3)

## 2023-01-31 LAB — HEMOGLOBIN A1C: Hgb A1c MFr Bld: 6.4 % (ref 4.6–6.5)

## 2023-01-31 MED ORDER — HYDROXYZINE HCL 10 MG PO TABS: 10.0000 mg | ORAL_TABLET | Freq: Three times a day (TID) | ORAL | 0 refills | Status: DC | PRN

## 2023-01-31 NOTE — Assessment & Plan Note (Signed)
Chronic issue.  Had been doing well though worsened recently with the death of her dog.  She opted to continue with her current dosing of Wellbutrin and Lexapro.  She will continue Wellbutrin 150 mg daily and Lexapro 10 mg daily.  She will let us know if anything worsens or is not improving

## 2023-01-31 NOTE — Patient Instructions (Signed)
Nice to see you. Please work on the financial assistance paperwork.

## 2023-01-31 NOTE — Assessment & Plan Note (Signed)
Chronic issue.  Adequately controlled at home.  She will continue spironolactone 12.5 mg twice daily and carvedilol 6.25 mg twice daily.

## 2023-01-31 NOTE — Assessment & Plan Note (Signed)
Suspect this is related to having had COVID.  Advised if not improving we could consider further lab evaluation for this.  We are checking an alkaline phosphatase with her labs today.

## 2023-01-31 NOTE — Assessment & Plan Note (Signed)
Likely post-COVID related.  Advised if not improving over the next 4 weeks to a month we would need to consider imaging of her head given her age.  Patient does not currently have insurance.  Did discuss having her apply for patient assistance as that would help with the cost of an MRI if we needed to proceed with that.

## 2023-01-31 NOTE — Assessment & Plan Note (Signed)
Chronic issue.  Worsened with the death of her dog.  We will refill hydroxyzine 10 mg every 8 hours as needed for anxiety.  She will continue Lexapro 10 mg daily and Wellbutrin 150 mg daily.

## 2023-01-31 NOTE — Progress Notes (Signed)
[name], MD Phone: [phone]  Crystal Roberson is a 57 y.o. female who presents today for f/u.  HYPERTENSION Disease Monitoring Home BP Monitoring generally <130/80 at home Chest pain- no    Dyspnea- stable Medications Compliance-  taking coreg, spironolactone. Lightheadedness-  none in the past 3 months  Edema- no BMET    Component Value Date/Time   NA 144 11/24/2021 0846   NA 141 01/04/2012 1748   K 4.1 11/24/2021 0846   K 3.4 (L) 01/04/2012 1748   CL 106 11/24/2021 0846   CL 104 01/04/2012 1748   CO2 24 11/24/2021 0846   CO2 28 01/04/2012 1748   GLUCOSE 118 (H) 11/24/2021 0846   GLUCOSE 118 (H) 02/10/2021 1030   GLUCOSE 92 01/04/2012 1748   BUN 12 11/24/2021 0846   BUN 8 01/04/2012 1748   CREATININE 1.02 (H) 11/24/2021 0846   CREATININE 0.74 04/16/2017 1054   CALCIUM 9.2 11/24/2021 0846   CALCIUM 8.9 01/04/2012 1748   GFRNONAA 59 (L) 02/10/2021 1030   GFRNONAA >60 01/04/2012 1748   GFRAA >60 02/24/2019 1537   GFRAA >60 01/04/2012 1748   Anxiety/depression: Patient notes this was better until her dog died recently.  She notes no SI.  She is on Wellbutrin and Lexapro.  She notes hydroxyzine is helpful with acute anxiety issues.  She notes some drowsiness if she takes it close to when she takes her pain medication.  Headache/body aches: Patient reports having COVID in December and then again in February.  She had cough with this and that went away.  She had significant headache with her COVID initially and has continued to have headaches 1-2 times a week.  The headaches occur all over her head.  She occasionally gets photophobia and phonophobia with these.  She does report a history of migraines when she was younger.  No numbness, weakness, or vision changes.  She also reports body aches that occur all over particularly after she has done something physical.   Social History   Tobacco Use  Smoking Status Former   Packs/day: 0.75   Years: 30.00   Additional pack  years: 0.00   Total pack years: 22.50   Types: Cigarettes   Quit date: 09/23/2016   Years since quitting: 6.3  Smokeless Tobacco Never    Current Outpatient Medications on File Prior to Visit  Medication Sig Dispense Refill   buPROPion (WELLBUTRIN XL) 150 MG 24 hr tablet Take 1 tablet (150 mg total) by mouth once daily. 30 tablet 0   escitalopram (LEXAPRO) 10 MG tablet Take 1 tablet (10 mg total) by mouth once daily. 30 tablet 0   ezetimibe (ZETIA) 10 MG tablet Take 1 tablet (10 mg total) by mouth daily. Please schedule appointment for further refills 30 tablet 1   furosemide (LASIX) 20 MG tablet Take 1 tablet (20 mg total) by mouth once daily as needed. 30 tablet 0   lidocaine (LIDODERM) 5 % Place 2-3 patches onto the skin every 12 (twelve) hours as needed (pain). Remove & Discard patch within 12 hours or as directed by MD     morphine (MS CONTIN) 15 MG 12 hr tablet Take 15 mg by mouth every 8 (eight) hours.      nitroGLYCERIN (NITROSTAT) 0.4 MG SL tablet Dissolve 1 tablet (0.4 mg total) under the tongue once every 5 (five) minutes as needed for chest pain. Max of 3 doses in 15 minutes. 25 tablet 0   potassium chloride SA (KLOR-CON M) 20 MEQ  tablet TAKE 1 TABLET BY MOUTH ONCE DAILY. (IF YOU TAKE LASIX TAKE AN EXTRA DOSE AS DIRECTED) 90 tablet 3   spironolactone (ALDACTONE) 25 MG tablet Take (1/2) tablet (12.5 mg total) by mouth 2 (two) times daily as needed. 90 tablet 3   tiZANidine (ZANAFLEX) 4 MG tablet Take 4-8 mg by mouth daily.     carvedilol (COREG) 6.25 MG tablet Take 1 tablet (6.25 mg total) by mouth 2 (two) times daily. 180 tablet 2   rosuvastatin (CRESTOR) 40 MG tablet Take 1 tablet (40 mg total) by mouth once daily. 90 tablet 3   No current facility-administered medications on file prior to visit.     ROS see history of present illness  Objective  Physical Exam Vitals:   01/31/23 0951  BP: 136/86  Pulse: 80  Temp: 98.4 F (36.9 C)  SpO2: 97%    BP Readings from  Last 3 Encounters:  01/31/23 136/86  12/01/22 (!) 147/85  11/14/22 130/80   Wt Readings from Last 3 Encounters:  01/31/23 184 lb 8 oz (83.7 kg)  11/14/22 170 lb (77.1 kg)  04/17/22 178 lb 12.8 oz (81.1 kg)    Physical Exam Constitutional:      General: She is not in acute distress.    Appearance: She is not diaphoretic.  Cardiovascular:     Rate and Rhythm: Normal rate and regular rhythm.     Heart sounds: Normal heart sounds.  Pulmonary:     Effort: Pulmonary effort is normal.     Breath sounds: Normal breath sounds.  Musculoskeletal:     Comments: Muscular tenderness in her arms or legs bilaterally  Skin:    General: Skin is warm and dry.  Neurological:     Mental Status: She is alert.     Comments: CN 3-12 intact, 5/5 strength in bilateral biceps, triceps, grip, quads, hamstrings, plantar and dorsiflexion, sensation to light touch intact in bilateral UE and LE, normal gait      Assessment/Plan: Please see individual problem list.  Hyperlipidemia LDL goal <70 -     Comprehensive metabolic panel -     Lipid panel  Chronic systolic heart failure (HCC) Assessment & Plan: Issue.  Seems to be euvolemic.  She will continue carvedilol 6.25 mg twice daily and spironolactone 12.5 mg twice daily.  Orders: -     Comprehensive metabolic panel  Moderate episode of recurrent major depressive disorder (HCC) Assessment & Plan: Chronic issue.  Had been doing well though worsened recently with the death of her dog.  She opted to continue with her current dosing of Wellbutrin and Lexapro.  She will continue Wellbutrin 150 mg daily and Lexapro 10 mg daily.  She will let us know if anything worsens or is not improving   Essential hypertension Assessment & Plan: Chronic issue.  Adequately controlled at home.  She will continue spironolactone 12.5 mg twice daily and carvedilol 6.25 mg twice daily.  Orders: -     Comprehensive metabolic panel -     Lipid panel -     Hemoglobin  A1c  Anxiety Assessment & Plan: Chronic issue.  Worsened with the death of her dog.  We will refill hydroxyzine 10 mg every 8 hours as needed for anxiety.  She will continue Lexapro 10 mg daily and Wellbutrin 150 mg daily.  Orders: -     hydrOXYzine HCl; Take 1 tablet (10 mg total) by mouth every 8 (eight) hours as needed for anxiety.  Dispense: 30 tablet; Refill:  0  Acute nonintractable headache, unspecified headache type Assessment & Plan: Likely post-COVID related.  Advised if not improving over the next 4 weeks to a month we would need to consider imaging of her head given her age.  Patient does not currently have insurance.  Did discuss having her apply for patient assistance as that would help with the cost of an MRI if we needed to proceed with that.   Body aches Assessment & Plan: Suspect this is related to having had COVID.  Advised if not improving we could consider further lab evaluation for this.  We are checking an alkaline phosphatase with her labs today.     Discussed applying for patient assistance to help with the cost of her chronic medical care and preventative care.  She was given paperwork to start the application process.  Return in about 3 months (around 05/03/2023) for htn.   [name], MD [facility]

## 2023-01-31 NOTE — Assessment & Plan Note (Signed)
Issue.  Seems to be euvolemic.  She will continue carvedilol 6.25 mg twice daily and spironolactone 12.5 mg twice daily.

## 2023-02-07 ENCOUNTER — Other Ambulatory Visit: Payer: Self-pay | Admitting: Medical

## 2023-02-07 ENCOUNTER — Other Ambulatory Visit: Payer: Self-pay

## 2023-02-07 DIAGNOSIS — E785 Hyperlipidemia, unspecified: Secondary | ICD-10-CM

## 2023-02-08 ENCOUNTER — Other Ambulatory Visit: Payer: Self-pay

## 2023-02-08 ENCOUNTER — Other Ambulatory Visit: Payer: Self-pay | Admitting: Medical

## 2023-02-08 ENCOUNTER — Ambulatory Visit (INDEPENDENT_AMBULATORY_CARE_PROVIDER_SITE_OTHER): Payer: Self-pay

## 2023-02-08 DIAGNOSIS — I255 Ischemic cardiomyopathy: Secondary | ICD-10-CM

## 2023-02-08 DIAGNOSIS — E785 Hyperlipidemia, unspecified: Secondary | ICD-10-CM

## 2023-02-08 LAB — CUP PACEART REMOTE DEVICE CHECK
Battery Remaining Longevity: 58 mo
Battery Remaining Percentage: 56 %
Battery Voltage: 2.95 V
Brady Statistic RV Percent Paced: 1 %
Date Time Interrogation Session: 20240516032526
HighPow Impedance: 77 Ohm
HighPow Impedance: 77 Ohm
Implantable Lead Connection Status: 753985
Implantable Lead Implant Date: 20180906
Implantable Lead Location: 753860
Implantable Pulse Generator Implant Date: 20180906
Lead Channel Impedance Value: 360 Ohm
Lead Channel Pacing Threshold Amplitude: 1 V
Lead Channel Pacing Threshold Pulse Width: 0.5 ms
Lead Channel Sensing Intrinsic Amplitude: 12 mV
Lead Channel Setting Pacing Amplitude: 2.5 V
Lead Channel Setting Pacing Pulse Width: 0.5 ms
Lead Channel Setting Sensing Sensitivity: 0.5 mV
Pulse Gen Serial Number: 9783396

## 2023-02-16 ENCOUNTER — Telehealth: Payer: Self-pay | Admitting: Family Medicine

## 2023-02-16 NOTE — Telephone Encounter (Signed)
Patient states her or the Pharmacy can not get in touch with her pain management provider. Patient states she has tried to get in touch with them for the past week due to being out of Tizanidine and that the pharmacy has requested these medications 3 times. Patient kept snorting and messing with something while on the phone. I had to ask questions because she was all over the place and hard to understand.

## 2023-02-16 NOTE — Telephone Encounter (Signed)
If those are typically prescribed by her pain management physician they need to be refilled by them. If I refill them that could interfere with her pain management contract with them and potentially result in them not refilling her medications moving forward.

## 2023-02-16 NOTE — Telephone Encounter (Signed)
Pt need a refill on tiZANidine and lidocaine sent to [facility] pharmacy. Pt stated she can not get in touch with her doctor that prescribes it

## 2023-02-19 NOTE — Telephone Encounter (Signed)
Can someone call over to Dr Santo Held office (pain management) and see what the issue is? Thanks.

## 2023-02-22 NOTE — Progress Notes (Signed)
Remote ICD transmission.   

## 2023-02-24 ENCOUNTER — Other Ambulatory Visit: Payer: Self-pay | Admitting: Medical

## 2023-02-24 DIAGNOSIS — E785 Hyperlipidemia, unspecified: Secondary | ICD-10-CM

## 2023-02-25 ENCOUNTER — Other Ambulatory Visit: Payer: Self-pay

## 2023-02-26 ENCOUNTER — Other Ambulatory Visit: Payer: Self-pay

## 2023-02-26 ENCOUNTER — Other Ambulatory Visit: Payer: Self-pay | Admitting: Medical

## 2023-02-26 DIAGNOSIS — E785 Hyperlipidemia, unspecified: Secondary | ICD-10-CM

## 2023-02-27 ENCOUNTER — Other Ambulatory Visit: Payer: Self-pay

## 2023-02-27 MED FILL — Ezetimibe Tab 10 MG: ORAL | 30 days supply | Qty: 30 | Fill #0 | Status: CN

## 2023-02-28 ENCOUNTER — Other Ambulatory Visit: Payer: Self-pay

## 2023-02-28 MED ORDER — LIDOCAINE 5 % EX PTCH
1.0000 | MEDICATED_PATCH | CUTANEOUS | 3 refills | Status: AC
  Filled 2023-03-07: qty 30, 10d supply, fill #0
  Filled 2023-06-20 – 2023-06-21 (×2): qty 30, 10d supply, fill #1
  Filled 2023-10-03 (×2): qty 30, 10d supply, fill #2

## 2023-03-01 ENCOUNTER — Other Ambulatory Visit: Payer: Self-pay

## 2023-03-06 ENCOUNTER — Other Ambulatory Visit: Payer: Self-pay

## 2023-03-07 ENCOUNTER — Other Ambulatory Visit: Payer: Self-pay

## 2023-03-07 MED FILL — Ezetimibe Tab 10 MG: ORAL | 30 days supply | Qty: 30 | Fill #0 | Status: AC

## 2023-03-12 NOTE — Telephone Encounter (Signed)
Called Dr. Milta Deiters office and left another message for them to call us about this Patient and her medication.

## 2023-03-12 NOTE — Telephone Encounter (Signed)
This message is from 3 weeks ago, I will leave this for Lanora Manis to handle if not done already.

## 2023-03-14 NOTE — Telephone Encounter (Signed)
Foye Clock from Dr Rosine Beat returned office phone call.Direct line is [phone].

## 2023-03-15 NOTE — Telephone Encounter (Signed)
Noted. I appreciate that they were able to help clarify what was going on.

## 2023-03-15 NOTE — Telephone Encounter (Signed)
Towana Badger called from Dr. Luther Bradley office.  I read messages to [place].  Trula Ore states patient was seen by Dr. Welton Flakes on 01/10/2023, patient had 25 1/2 pills left.  Trula Ore states patient had enough to last until 02/16/2023 if she were to take one pill per day.  Trula Ore states she called [facility] herself to verify the following information:  Trula Ore states patients Lidocaine patches was refilled on 02/28/2023, a 30-day supply, with three refills.  tiZANidine (ZANAFLEX) 4 MG tablet - filled on 02/26/2023, a 30-day supply, with two refills.

## 2023-03-15 NOTE — Telephone Encounter (Signed)
Left Foye Clock from Dr. Milta Deiters office a message to call me back regarding this Patient.

## 2023-04-25 ENCOUNTER — Encounter (INDEPENDENT_AMBULATORY_CARE_PROVIDER_SITE_OTHER): Payer: Self-pay

## 2023-05-10 ENCOUNTER — Ambulatory Visit (INDEPENDENT_AMBULATORY_CARE_PROVIDER_SITE_OTHER): Payer: Self-pay

## 2023-05-10 ENCOUNTER — Other Ambulatory Visit: Payer: Self-pay

## 2023-05-10 DIAGNOSIS — I255 Ischemic cardiomyopathy: Secondary | ICD-10-CM

## 2023-05-11 ENCOUNTER — Ambulatory Visit: Payer: Self-pay | Admitting: Family Medicine

## 2023-05-12 LAB — CUP PACEART REMOTE DEVICE CHECK
Battery Remaining Longevity: 57 mo
Battery Remaining Percentage: 55 %
Battery Voltage: 2.95 V
Brady Statistic RV Percent Paced: 1 %
Date Time Interrogation Session: 20240816235641
HighPow Impedance: 78 Ohm
HighPow Impedance: 78 Ohm
Implantable Lead Connection Status: 753985
Implantable Lead Implant Date: 20180906
Implantable Lead Location: 753860
Implantable Pulse Generator Implant Date: 20180906
Lead Channel Impedance Value: 390 Ohm
Lead Channel Pacing Threshold Amplitude: 1 V
Lead Channel Pacing Threshold Pulse Width: 0.5 ms
Lead Channel Sensing Intrinsic Amplitude: 12 mV
Lead Channel Setting Pacing Amplitude: 2.5 V
Lead Channel Setting Pacing Pulse Width: 0.5 ms
Lead Channel Setting Sensing Sensitivity: 0.5 mV
Pulse Gen Serial Number: 9783396

## 2023-05-22 NOTE — Progress Notes (Signed)
Remote ICD transmission.   

## 2023-05-27 ENCOUNTER — Other Ambulatory Visit: Payer: Self-pay | Admitting: Internal Medicine

## 2023-05-27 ENCOUNTER — Other Ambulatory Visit: Payer: Self-pay | Admitting: Family Medicine

## 2023-05-27 DIAGNOSIS — F331 Major depressive disorder, recurrent, moderate: Secondary | ICD-10-CM

## 2023-05-27 MED FILL — Ezetimibe Tab 10 MG: ORAL | 30 days supply | Qty: 30 | Fill #1 | Status: AC

## 2023-05-28 ENCOUNTER — Other Ambulatory Visit: Payer: Self-pay

## 2023-05-29 ENCOUNTER — Other Ambulatory Visit: Payer: Self-pay | Admitting: Internal Medicine

## 2023-05-29 ENCOUNTER — Other Ambulatory Visit: Payer: Self-pay

## 2023-05-29 ENCOUNTER — Other Ambulatory Visit: Payer: Self-pay | Admitting: Family Medicine

## 2023-05-29 DIAGNOSIS — F331 Major depressive disorder, recurrent, moderate: Secondary | ICD-10-CM

## 2023-05-29 NOTE — Telephone Encounter (Signed)
Can you please reach out to patient and schedule past due 3 month follow up appt.

## 2023-05-30 ENCOUNTER — Other Ambulatory Visit: Payer: Self-pay

## 2023-05-30 MED FILL — Spironolactone Tab 25 MG: ORAL | 75 days supply | Qty: 75 | Fill #0 | Status: AC

## 2023-05-30 MED FILL — Bupropion HCl Tab ER 24HR 150 MG: ORAL | 30 days supply | Qty: 30 | Fill #0 | Status: AC

## 2023-05-30 MED FILL — Escitalopram Oxalate Tab 10 MG (Base Equiv): ORAL | 30 days supply | Qty: 30 | Fill #0 | Status: AC

## 2023-05-30 MED FILL — Carvedilol Tab 6.25 MG: ORAL | 90 days supply | Qty: 180 | Fill #0 | Status: AC

## 2023-05-31 NOTE — Telephone Encounter (Signed)
Left voicemail to schedule follow up appt

## 2023-06-01 ENCOUNTER — Other Ambulatory Visit: Payer: Self-pay

## 2023-06-11 ENCOUNTER — Ambulatory Visit (INDEPENDENT_AMBULATORY_CARE_PROVIDER_SITE_OTHER): Payer: Self-pay | Admitting: Family Medicine

## 2023-06-11 ENCOUNTER — Encounter: Payer: Self-pay | Admitting: Family Medicine

## 2023-06-11 VITALS — BP 146/92 | HR 90 | Temp 98.5°F | Ht 65.0 in | Wt 186.0 lb

## 2023-06-11 DIAGNOSIS — F32A Depression, unspecified: Secondary | ICD-10-CM

## 2023-06-11 DIAGNOSIS — Z23 Encounter for immunization: Secondary | ICD-10-CM

## 2023-06-11 DIAGNOSIS — F419 Anxiety disorder, unspecified: Secondary | ICD-10-CM

## 2023-06-11 DIAGNOSIS — E269 Hyperaldosteronism, unspecified: Secondary | ICD-10-CM

## 2023-06-11 DIAGNOSIS — I10 Essential (primary) hypertension: Secondary | ICD-10-CM

## 2023-06-11 MED ORDER — HYDROXYZINE HCL 10 MG PO TABS: 10.0000 mg | ORAL_TABLET | Freq: Three times a day (TID) | ORAL | 0 refills | Status: DC | PRN

## 2023-06-11 MED ORDER — ESCITALOPRAM OXALATE 20 MG PO TABS: 20.0000 mg | ORAL_TABLET | Freq: Every day | ORAL | 1 refills | Status: DC

## 2023-06-11 MED ORDER — SPIRONOLACTONE 50 MG PO TABS: 50.0000 mg | ORAL_TABLET | Freq: Every day | ORAL | 1 refills | Status: DC

## 2023-06-11 NOTE — Assessment & Plan Note (Signed)
Chronic issue.  Advised endocrinology evaluation though she declines at this time given lack of insurance.  She will fill out financial assistance paperwork and see if that gets approved.

## 2023-06-11 NOTE — Assessment & Plan Note (Signed)
Chronic issue.  Blood pressure is quite elevated recently.  This could be related to her hyperaldosteronism.  Will increase spironolactone to 50 mg daily and continue carvedilol 6.25 mg twice daily.  Discussed if she has more frequent low blood pressures with this dose increase she needs to go back to the 25 mg dose of spironolactone and let us know.  She will return in about 10 days for labs.  She will see me in 2 months.  She sees cardiology in about a month and they can recheck her blood pressure at that time.  I did discuss having her see endocrinology though she does not have insurance.  I did give her financial assistance paperwork to fill out to see if she would qualify for that.

## 2023-06-11 NOTE — Progress Notes (Signed)
[name], MD Phone: [phone]  Crystal Roberson is a 57 y.o. female who presents today for follow-up.  Hypertension: Patient notes blood pressure recently has been 198/110 and occasionally up to the 200s systolic.  Notes in the very least it is typically around what we got today.  She has had a couple of occasions where her blood pressure will plummet though notes she can go months without that occurring.  That is a recurrent issue.  She takes spironolactone 25 mg daily and also takes carvedilol 6.25 mg twice daily.  She notes occasional chest tightness and shortness of breath.  Both of those things are chronic and unchanged.  She does follow with cardiology.  She sees them in the near future.  On review of the chart it appears that it she has hyperaldosteronism and has not seen endocrinology previously.  Anxiety/depression: Patient notes depression has worsened.  She has been having back issues over the last month and has had trouble getting pain medication through pain management.  She also notes some anxiety.  Notes she is on Lexapro 10 mg daily and Wellbutrin 150 mg daily.  She takes hydroxyzine as needed.  No SI.  Social History   Tobacco Use  Smoking Status Former   Current packs/day: 0.00   Average packs/day: 0.8 packs/day for 30.0 years (22.5 ttl pk-yrs)   Types: Cigarettes   Start date: 09/23/1986   Quit date: 09/23/2016   Years since quitting: 6.7  Smokeless Tobacco Never    Current Outpatient Medications on File Prior to Visit  Medication Sig Dispense Refill   buPROPion (WELLBUTRIN XL) 150 MG 24 hr tablet Take 1 tablet (150 mg total) by mouth once daily. 30 tablet 0   carvedilol (COREG) 6.25 MG tablet Take 1 tablet (6.25 mg total) by mouth 2 (two) times daily. PLEASE CALL OFFICE TO SCHEDULE APPOINTMENT PRIOR TO NEXT REFILL 180 tablet 0   ezetimibe (ZETIA) 10 MG tablet Take 1 tablet (10 mg total) by mouth daily. Please schedule appointment for further refills 30 tablet 1    furosemide (LASIX) 20 MG tablet Take 1 tablet (20 mg total) by mouth once daily as needed. 30 tablet 0   lidocaine (LIDODERM) 5 % Place 2-3 patches onto the skin every 12 (twelve) hours as needed (pain). Remove & Discard patch within 12 hours or as directed by MD     lidocaine (LIDODERM) 5 % Place 1-3 patches onto the skin as directed for 12 hours on and 12 hours off 90 patch 3   morphine (MS CONTIN) 15 MG 12 hr tablet Take 15 mg by mouth every 8 (eight) hours.      nitroGLYCERIN (NITROSTAT) 0.4 MG SL tablet Dissolve 1 tablet (0.4 mg total) under the tongue once every 5 (five) minutes as needed for chest pain. Max of 3 doses in 15 minutes. 25 tablet 0   potassium chloride SA (KLOR-CON M) 20 MEQ tablet TAKE 1 TABLET BY MOUTH ONCE DAILY. (IF YOU TAKE LASIX TAKE AN EXTRA DOSE AS DIRECTED) 90 tablet 3   rosuvastatin (CRESTOR) 40 MG tablet Take 1 tablet (40 mg total) by mouth once daily. 90 tablet 3   tiZANidine (ZANAFLEX) 4 MG tablet Take 4-8 mg by mouth daily.     No current facility-administered medications on file prior to visit.     ROS see history of present illness  Objective  Physical Exam Vitals:   06/11/23 1309  BP: (!) 146/92  Pulse: 90  Temp: 98.5 F (36.9 C)  SpO2: 98%    BP Readings from Last 3 Encounters:  06/11/23 (!) 146/92  01/31/23 136/86  12/01/22 (!) 147/85   Wt Readings from Last 3 Encounters:  06/11/23 186 lb (84.4 kg)  01/31/23 184 lb 8 oz (83.7 kg)  11/14/22 170 lb (77.1 kg)    Physical Exam Constitutional:      General: She is not in acute distress.    Appearance: She is not diaphoretic.  Cardiovascular:     Rate and Rhythm: Normal rate and regular rhythm.     Heart sounds: Normal heart sounds.  Pulmonary:     Effort: Pulmonary effort is normal.     Breath sounds: Normal breath sounds.  Skin:    General: Skin is warm and dry.  Neurological:     Mental Status: She is alert.      Assessment/Plan: Please see individual problem  list.  Essential hypertension Assessment & Plan: Chronic issue.  Blood pressure is quite elevated recently.  This could be related to her hyperaldosteronism.  Will increase spironolactone to 50 mg daily and continue carvedilol 6.25 mg twice daily.  Discussed if she has more frequent low blood pressures with this dose increase she needs to go back to the 25 mg dose of spironolactone and let us know.  She will return in about 10 days for labs.  She will see me in 2 months.  She sees cardiology in about a month and they can recheck her blood pressure at that time.  I did discuss having her see endocrinology though she does not have insurance.  I did give her financial assistance paperwork to fill out to see if she would qualify for that.  Orders: -     Spironolactone; Take 1 tablet (50 mg total) by mouth daily.  Dispense: 90 tablet; Refill: 1 -     Basic metabolic panel; Future  Hyperaldosteronism (HCC) Assessment & Plan: Chronic issue.  Advised endocrinology evaluation though she declines at this time given lack of insurance.  She will fill out financial assistance paperwork and see if that gets approved.  Orders: -     Spironolactone; Take 1 tablet (50 mg total) by mouth daily.  Dispense: 90 tablet; Refill: 1  Anxiety Assessment & Plan: Chronic issue.  Worsened recently.  Will increase Lexapro to 20 mg daily.  She will continue Wellbutrin 150 mg daily.  She can continue hydroxyzine 10 mg 3 times daily as needed for anxiety.  Follow-up in 2 months.  Orders: -     hydrOXYzine HCl; Take 1 tablet (10 mg total) by mouth every 8 (eight) hours as needed for anxiety.  Dispense: 30 tablet; Refill: 0 -     Escitalopram Oxalate; Take 1 tablet (20 mg total) by mouth daily.  Dispense: 90 tablet; Refill: 1  Anxiety and depression Assessment & Plan: Chronic issue.  Worsened recently.  Will increase Lexapro to 20 mg daily.  She will continue Wellbutrin 150 mg daily.  She can continue hydroxyzine 10 mg 3  times daily as needed for anxiety.  Follow-up in 2 months.   Other orders -     Flu Vaccine QUAD 48mo+IM (Fluarix, Fluzone & Alfiuria Quad PF)     Return in about 10 days (around 06/21/2023) for Labs, 2 months with PCP for anxiety and depression.   [name], MD [facility]

## 2023-06-11 NOTE — Assessment & Plan Note (Signed)
Chronic issue.  Worsened recently.  Will increase Lexapro to 20 mg daily.  She will continue Wellbutrin 150 mg daily.  She can continue hydroxyzine 10 mg 3 times daily as needed for anxiety.  Follow-up in 2 months.

## 2023-06-11 NOTE — Patient Instructions (Signed)
Nice to see you. We are going to increase your spironolactone to 50 mg daily.  You need to return in about 10 days for lab work. We are increasing your Lexapro to 20 mg daily.  You can take 2 of your 10 mg tablets until you run out and then your next prescription will be for a 20 mg tablet taken once daily.

## 2023-06-20 ENCOUNTER — Other Ambulatory Visit (HOSPITAL_COMMUNITY): Payer: Self-pay

## 2023-06-20 ENCOUNTER — Other Ambulatory Visit: Payer: Self-pay | Admitting: Internal Medicine

## 2023-06-20 ENCOUNTER — Telehealth: Payer: Self-pay | Admitting: Family Medicine

## 2023-06-20 ENCOUNTER — Other Ambulatory Visit: Payer: Self-pay

## 2023-06-20 MED ORDER — CLOPIDOGREL BISULFATE 75 MG PO TABS
75.0000 mg | ORAL_TABLET | Freq: Every day | ORAL | 0 refills | Status: DC
  Filled 2023-06-20: qty 30, 30d supply, fill #0

## 2023-06-20 MED ORDER — ROSUVASTATIN CALCIUM 40 MG PO TABS
40.0000 mg | ORAL_TABLET | Freq: Every day | ORAL | 0 refills | Status: DC
  Filled 2023-06-20: qty 30, 30d supply, fill #0

## 2023-06-20 NOTE — Telephone Encounter (Signed)
Patient just called and said her refills for her medications hydrOXYzine (ATARAX) 10 MG tablet and spironolactone (ALDACTONE) 50 MG tablet the pharmacy she uses is [facility] [address] [place] [address] Phone: [phone]  Fax: [phone] DEA #: [id number]  Her number is [phone]

## 2023-06-21 ENCOUNTER — Other Ambulatory Visit: Payer: Self-pay

## 2023-07-02 ENCOUNTER — Other Ambulatory Visit (INDEPENDENT_AMBULATORY_CARE_PROVIDER_SITE_OTHER): Payer: Self-pay

## 2023-07-02 ENCOUNTER — Other Ambulatory Visit: Payer: Self-pay | Admitting: Internal Medicine

## 2023-07-02 ENCOUNTER — Other Ambulatory Visit: Payer: Self-pay

## 2023-07-02 DIAGNOSIS — I10 Essential (primary) hypertension: Secondary | ICD-10-CM

## 2023-07-02 LAB — BASIC METABOLIC PANEL
BUN: 13 mg/dL (ref 6–23)
CO2: 28 meq/L (ref 19–32)
Calcium: 9.2 mg/dL (ref 8.4–10.5)
Chloride: 104 meq/L (ref 96–112)
Creatinine, Ser: 1.14 mg/dL (ref 0.40–1.20)
GFR: 52.63 mL/min — ABNORMAL LOW (ref >60.00)
Glucose, Bld: 138 mg/dL — ABNORMAL HIGH (ref 70–99)
Potassium: 3.8 meq/L (ref 3.5–5.1)
Sodium: 139 meq/L (ref 135–145)

## 2023-07-04 ENCOUNTER — Telehealth: Payer: Self-pay

## 2023-07-04 NOTE — Telephone Encounter (Signed)
LMTCB

## 2023-07-04 NOTE — Telephone Encounter (Signed)
-----   Message from [name] sent at 07/03/2023  8:17 PM EDT ----- Please let the patient know that her kidney function is generally stable compared to her baseline over the last several years.  We will continue to monitor her kidney function on her blood pressure medications.

## 2023-07-11 ENCOUNTER — Other Ambulatory Visit: Payer: Self-pay

## 2023-07-13 ENCOUNTER — Encounter: Payer: Self-pay | Admitting: Internal Medicine

## 2023-07-13 ENCOUNTER — Other Ambulatory Visit: Payer: Self-pay

## 2023-07-13 ENCOUNTER — Ambulatory Visit: Payer: Self-pay | Attending: Medical | Admitting: Internal Medicine

## 2023-07-13 VITALS — BP 124/70 | HR 72 | Ht 65.0 in | Wt 190.8 lb

## 2023-07-13 DIAGNOSIS — E782 Mixed hyperlipidemia: Secondary | ICD-10-CM

## 2023-07-13 DIAGNOSIS — I10 Essential (primary) hypertension: Secondary | ICD-10-CM

## 2023-07-13 DIAGNOSIS — G8929 Other chronic pain: Secondary | ICD-10-CM

## 2023-07-13 DIAGNOSIS — I255 Ischemic cardiomyopathy: Secondary | ICD-10-CM

## 2023-07-13 DIAGNOSIS — I2510 Atherosclerotic heart disease of native coronary artery without angina pectoris: Secondary | ICD-10-CM

## 2023-07-13 DIAGNOSIS — I5022 Chronic systolic (congestive) heart failure: Secondary | ICD-10-CM

## 2023-07-13 DIAGNOSIS — M549 Dorsalgia, unspecified: Secondary | ICD-10-CM

## 2023-07-13 MED ORDER — FUROSEMIDE 20 MG PO TABS
20.0000 mg | ORAL_TABLET | Freq: Every day | ORAL | 4 refills | Status: AC | PRN
  Filled 2023-07-13: qty 30, 30d supply, fill #0
  Filled 2023-12-28: qty 30, 30d supply, fill #1

## 2023-07-13 MED ORDER — NITROGLYCERIN 0.4 MG SL SUBL
0.4000 mg | SUBLINGUAL_TABLET | SUBLINGUAL | 3 refills | Status: AC | PRN
  Filled 2023-07-13: qty 25, 8d supply, fill #0

## 2023-07-13 NOTE — Progress Notes (Signed)
Cardiology Office Note:  .   Date:  07/13/2023  ID:  Crystal Roberson, DOB Aug 05, 1966, MRN [id number] PCP: Glori Luis, MD  [facility] Providers Cardiologist:  Yvonne Kendall, MD     History of Present Illness: .   Crystal Roberson is a 57 y.o. female  with history of coronary artery disease status post anterior STEMI in 08/2016, chronic systolic heart failure due to ischemic cardiomyopathy status post ICD (05/2016), hypertension with suspicion for hyperaldosteronism, migraine headaches, degenerative disc disease with chronic back pain, and depression, who presents for follow-up of CAD and HFrEF.  She was last seen in our office in 03/2023, at which time she reported intermittent orthostatic lightheadedness and episodes of low blood pressure.  She continued to be bothered by severe back pain.  No medication changes or additional testing were made; 3 month follow-up was recommended.  She has not followed up with [place] until today (approximately 1.5 years).  Today, Crystal Roberson reports that she is a little more tired and short of breath than usual.  She notes that her back pain has been bothering her a lot, and she is concerned that she may be coming tolerant to morphine.  She recently saw her pain specialist, who did not make any medication changes.  He brought up the possibility of adding oxycodone, though this is cost prohibitive for Crystal Roberson.  Her mobility remains quite limited on account of her back pain.  She also has intermittent episodes of lightheadedness with reported blood pressures as low as 60/40.  These come on randomly.  She does not check her blood pressure regularly at home.  It was elevated at her most recent visit with Dr. Birdie Sons last month, prompting escalation of spironolactone to 50 mg daily.  He also placed another referral to endocrinology, given concern for hyperaldosteronism on prior testing.  Crystal Roberson reports an episode of chest pain about a month ago that felt like  pressure on the front of her chest.  She does not recall what she was doing at the time.  It lasted about 30 minutes and then resolved spontaneously.  There were no associated symptoms.  It was different than what she felt at the time of her MI in 2017.  She has had some other vague episodes of chest pain at other times but cannot describe them further.  ROS: See HPI  Studies Reviewed: Marland Kitchen   EKG Interpretation Date/Time:  Friday July 13 2023 09:18:27 EDT Ventricular Rate:  72 PR Interval:  150 QRS Duration:  72 QT Interval:  382 QTC Calculation: 418 R Axis:   11  Text Interpretation: Normal sinus rhythm Inferior infarct , age undetermined Anterior infarct , age undetermined T wave abnormality, consider lateral ischemia When compared with ECG of 01-Dec-2022 13:12, Inferior infarct is now Present T wave inversion now evident in Lateral leads Confirmed by Nancy Manuele, Cristal Deer [phone]) on 07/13/2023 9:25:29 AM    Risk Assessment/Calculations:             Physical Exam:   VS:  BP 124/70 (BP Location: Left Arm)   Pulse 72   Ht 5\' 5"  (1.651 m)   Wt 190 lb 12.8 oz (86.5 kg)   SpO2 97%   BMI 31.75 kg/m    Wt Readings from Last 3 Encounters:  07/13/23 190 lb 12.8 oz (86.5 kg)  06/11/23 186 lb (84.4 kg)  01/31/23 184 lb 8 oz (83.7 kg)    General:  NAD. Neck: No JVD or  HJR. Lungs: Clear to auscultation bilaterally without wheezes or crackles. Heart: Regular rate and rhythm without murmurs, rubs, or gallops. Abdomen: Soft, nontender, nondistended. Extremities: No lower extremity edema.  ASSESSMENT AND PLAN: .   Coronary artery disease: Crystal Roberson notes some vague chest discomfort at times and an episode of more persistent chest pressure about a month ago.  It is notable that the pain was different than what she experienced at the time of her MI in 2017.  I have recommended repeating an echocardiogram to reassess her cardiomyopathy.  Unless she has more frequent chest pains or has interval  decline in her LVEF, I would favor deferring ischemia evaluation for the time being.  Continue secondary prevention with clopidogrel, ezetimibe, and rosuvastatin.  Nitroglycerin refill provided today.  Chronic HFrEF due to ischemic cardiomyopathy: Crystal Roberson has put on some weight though she appears fairly euvolemic on exam.  Of note, she has been out of her as needed furosemide for the last few months.  We will refill that today.  Continue carvedilol 6.25 mg twice daily and spironolactone 50 mg daily.  She has been intolerant of ACE inhibitors in the past due to symptomatic hypotension.  However, I would have a low threshold for rechallenging her with an ARB in the future.  I suspect her intermittent lightheadedness and reported low blood pressures at home are likely due to vasovagal mechanism.  We will repeat an echocardiogram to see if her LVEF has changed since 2020.  Continue ongoing follow-up of ICD through the device clinic.  Chronic back pain: Continue follow-up with pain specialist.  Favor minimization of sedating medications, including opioids, given report of lightheadedness and soft blood pressures.  Hyperlipidemia: LDL well-controlled at 55 on last check in May.  Continue rosuvastatin and ezetimibe.  Hypertension: Initial blood pressure today mildly elevated, but better on recheck.  Continue current doses of carvedilol and spironolactone with previous concern for hyperaldosteronism.  Agree with referring to endocrinology for further assessment, if not cost prohibitive.  Low threshold for adding ARB at follow-up visits if blood pressure remains normal to elevated.    Dispo: Return to clinic in 6 weeks.  Signed, Yvonne Kendall, MD

## 2023-07-13 NOTE — Patient Instructions (Signed)
Medication Instructions:  Your physician recommends that you continue on your current medications as directed. Please refer to the Current Medication list given to you today.   *If you need a refill on your cardiac medications before your next appointment, please call your pharmacy*   Lab Work: No labs ordered today .   Testing/Procedures: Your physician has requested that you have an echocardiogram. Echocardiography is a painless test that uses sound waves to create images of your heart. It provides your doctor with information about the size and shape of your heart and how well your heart's chambers and valves are working.   You may receive an ultrasound enhancing agent through an IV if needed to better visualize your heart during the echo. This procedure takes approximately one hour.  There are no restrictions for this procedure.  This will take place at [address], [place] [address]    Follow-Up: At [facility], you and your health needs are our priority.  As part of our continuing mission to provide you with exceptional heart care, we have created designated Provider Care Teams.  These Care Teams include your primary Cardiologist (physician) and Advanced Practice Providers (APPs -  Physician Assistants and Nurse Practitioners) who all work together to provide you with the care you need, when you need it.  We recommend signing up for the patient portal called "MyChart".  Sign up information is provided on this After Visit Summary.  MyChart is used to connect with patients for Virtual Visits (Telemedicine).  Patients are able to view lab/test results, encounter notes, upcoming appointments, etc.  Non-urgent messages can be sent to your provider as well.   To learn more about what you can do with MyChart, go to [email or web address].    Your next appointment:   6 week(s)  Provider:   You may see Yvonne Kendall, MD or one of the  following Advanced Practice Providers on your designated Care Team:   Nicolasa Ducking, NP Eula Listen, PA-C Cadence Fransico Michael, PA-C Charlsie Quest, NP

## 2023-07-23 ENCOUNTER — Other Ambulatory Visit: Payer: Self-pay

## 2023-07-30 ENCOUNTER — Ambulatory Visit: Payer: Self-pay | Attending: Internal Medicine

## 2023-07-30 DIAGNOSIS — I5022 Chronic systolic (congestive) heart failure: Secondary | ICD-10-CM

## 2023-07-30 LAB — ECHOCARDIOGRAM COMPLETE: Area-P 1/2: 3.6 cm2

## 2023-07-30 MED ORDER — PERFLUTREN LIPID MICROSPHERE
1.0000 mL | INTRAVENOUS | Status: AC | PRN
Start: 2023-07-30 — End: 2023-07-30
  Administered 2023-07-30: 3 mL via INTRAVENOUS

## 2023-08-09 ENCOUNTER — Ambulatory Visit (INDEPENDENT_AMBULATORY_CARE_PROVIDER_SITE_OTHER): Payer: Self-pay

## 2023-08-09 DIAGNOSIS — I255 Ischemic cardiomyopathy: Secondary | ICD-10-CM

## 2023-08-09 LAB — CUP PACEART REMOTE DEVICE CHECK
Battery Remaining Longevity: 55 mo
Battery Remaining Percentage: 52 %
Battery Voltage: 2.95 V
Brady Statistic RV Percent Paced: 1 %
Date Time Interrogation Session: 20241114020017
HighPow Impedance: 77 Ohm
HighPow Impedance: 77 Ohm
Implantable Lead Connection Status: 753985
Implantable Lead Implant Date: 20180906
Implantable Lead Location: 753860
Implantable Pulse Generator Implant Date: 20180906
Lead Channel Impedance Value: 390 Ohm
Lead Channel Pacing Threshold Amplitude: 1 V
Lead Channel Pacing Threshold Pulse Width: 0.5 ms
Lead Channel Sensing Intrinsic Amplitude: 12 mV
Lead Channel Setting Pacing Amplitude: 2.5 V
Lead Channel Setting Pacing Pulse Width: 0.5 ms
Lead Channel Setting Sensing Sensitivity: 0.5 mV
Pulse Gen Serial Number: 9783396

## 2023-08-14 ENCOUNTER — Ambulatory Visit (INDEPENDENT_AMBULATORY_CARE_PROVIDER_SITE_OTHER): Payer: Self-pay | Admitting: Family Medicine

## 2023-08-14 ENCOUNTER — Other Ambulatory Visit: Payer: Self-pay | Admitting: Family Medicine

## 2023-08-14 ENCOUNTER — Other Ambulatory Visit: Payer: Self-pay

## 2023-08-14 ENCOUNTER — Other Ambulatory Visit: Payer: Self-pay | Admitting: Internal Medicine

## 2023-08-14 ENCOUNTER — Other Ambulatory Visit: Payer: Self-pay | Admitting: Medical

## 2023-08-14 ENCOUNTER — Encounter: Payer: Self-pay | Admitting: Family Medicine

## 2023-08-14 VITALS — BP 130/84 | HR 73 | Temp 98.2°F | Ht 65.0 in | Wt 191.0 lb

## 2023-08-14 DIAGNOSIS — E785 Hyperlipidemia, unspecified: Secondary | ICD-10-CM

## 2023-08-14 DIAGNOSIS — F419 Anxiety disorder, unspecified: Secondary | ICD-10-CM

## 2023-08-14 DIAGNOSIS — G8929 Other chronic pain: Secondary | ICD-10-CM

## 2023-08-14 DIAGNOSIS — F32A Depression, unspecified: Secondary | ICD-10-CM

## 2023-08-14 DIAGNOSIS — F331 Major depressive disorder, recurrent, moderate: Secondary | ICD-10-CM

## 2023-08-14 MED ORDER — ESCITALOPRAM OXALATE 10 MG PO TABS
ORAL_TABLET | ORAL | 0 refills | Status: DC
  Filled 2023-08-14: qty 21, 14d supply, fill #0

## 2023-08-14 MED ORDER — DULOXETINE HCL 30 MG PO CPEP
ORAL_CAPSULE | ORAL | 3 refills | Status: DC
Start: 2023-08-14 — End: 2024-04-03
  Filled 2023-08-14: qty 60, 37d supply, fill #0
  Filled 2023-10-03: qty 60, 30d supply, fill #1
  Filled 2023-12-28: qty 60, 30d supply, fill #2
  Filled 2024-02-24: qty 60, 30d supply, fill #3

## 2023-08-14 NOTE — Progress Notes (Signed)
[name], MD Phone: [phone]  Crystal Roberson is a 57 y.o. female who presents today for follow-up.  Anxiety/depression: Patient notes depression is not great due to her chronic back issues.  She is been on Wellbutrin and Lexapro.  Also taking hydroxyzine as needed.  No SI.  She has been taking 40 mg of Lexapro.  Chronic back pain: Patient notes her pain specialist tried to switch her off of the morphine though that did not work well so she is back on morphine.  She is been using Lidoderm patches with some benefit.  She does not have insurance so cannot have any injections.  Physical therapy was not terribly beneficial in the past as she was not able to participate adequately due to pain.  Social History   Tobacco Use  Smoking Status Former   Current packs/day: 0.00   Average packs/day: 0.8 packs/day for 30.0 years (22.5 ttl pk-yrs)   Types: Cigarettes   Start date: 09/23/1986   Quit date: 09/23/2016   Years since quitting: 6.8  Smokeless Tobacco Never    Current Outpatient Medications on File Prior to Visit  Medication Sig Dispense Refill   buPROPion (WELLBUTRIN XL) 150 MG 24 hr tablet Take 1 tablet (150 mg total) by mouth once daily. 30 tablet 0   carvedilol (COREG) 6.25 MG tablet Take 1 tablet (6.25 mg total) by mouth 2 (two) times daily. PLEASE CALL OFFICE TO SCHEDULE APPOINTMENT PRIOR TO NEXT REFILL 180 tablet 0   clopidogrel (PLAVIX) 75 MG tablet Take 1 tablet (75 mg total) by mouth once daily. 30 tablet 0   ezetimibe (ZETIA) 10 MG tablet Take 1 tablet (10 mg total) by mouth daily. Please schedule appointment for further refills 30 tablet 1   furosemide (LASIX) 20 MG tablet Take 1 tablet (20 mg total) by mouth once daily as needed. 30 tablet 4   hydrOXYzine (ATARAX) 10 MG tablet Take 1 tablet (10 mg total) by mouth every 8 (eight) hours as needed for anxiety. 30 tablet 0   lidocaine (LIDODERM) 5 % Place 2-3 patches onto the skin every 12 (twelve) hours as needed (pain).  Remove & Discard patch within 12 hours or as directed by MD     lidocaine (LIDODERM) 5 % Place 1-3 patches onto the skin as directed for 12 hours on and 12 hours off 90 patch 3   morphine (MS CONTIN) 15 MG 12 hr tablet Take 15 mg by mouth every 8 (eight) hours.      nitroGLYCERIN (NITROSTAT) 0.4 MG SL tablet Dissolve 1 tablet (0.4 mg total) under the tongue once every 5 (five) minutes as needed for chest pain. Max of 3 doses in 15 minutes. 25 tablet 3   potassium chloride SA (KLOR-CON M) 20 MEQ tablet TAKE 1 TABLET BY MOUTH ONCE DAILY. (IF YOU TAKE LASIX TAKE AN EXTRA DOSE AS DIRECTED) 90 tablet 3   rosuvastatin (CRESTOR) 40 MG tablet Take 1 tablet (40 mg total) by mouth once daily. 30 tablet 0   spironolactone (ALDACTONE) 50 MG tablet Take 1 tablet (50 mg total) by mouth daily. 90 tablet 1   tiZANidine (ZANAFLEX) 4 MG tablet Take 4-8 mg by mouth daily.     No current facility-administered medications on file prior to visit.     ROS see history of present illness  Objective  Physical Exam Vitals:   08/14/23 1048  BP: 130/84  Pulse: 73  Temp: 98.2 F (36.8 C)  SpO2: 96%    BP Readings from  Last 3 Encounters:  08/14/23 130/84  07/13/23 124/70  06/11/23 (!) 146/92   Wt Readings from Last 3 Encounters:  08/14/23 191 lb (86.6 kg)  07/13/23 190 lb 12.8 oz (86.5 kg)  06/11/23 186 lb (84.4 kg)    Physical Exam Constitutional:      General: She is not in acute distress. Pulmonary:     Effort: Pulmonary effort is normal.  Neurological:     Mental Status: She is alert.      Assessment/Plan: Please see individual problem list.  Anxiety and depression Assessment & Plan: Chronic issue.  Uncontrolled.  I believe there may have been some misunderstanding on dosing of her Lexapro during her last visit.  Discussed that 40 mg of Lexapro is too much.  We are going to taper her off of Lexapro and switch her to Cymbalta to see if we can get benefit for her pain as well.  She will  reduce Lexapro to 20 mg daily for 1 week and then reduce the dose to 10 mg daily for 1 week.  At the same time as reducing the Lexapro to 10 mg she will start on Cymbalta 30 mg daily for 2 weeks and then increase the Cymbalta to 60 mg daily.  If she has any withdrawal symptoms she will let us know.  She will also continue Wellbutrin 150 mg daily and she can continue hydroxyzine 10 mg 3 times daily as needed for anxiety.  She will follow-up in 2 months for transfer of care.  Orders: -     Escitalopram Oxalate; Take 2 tablets (20 mg total) by mouth daily for 7 days, THEN 1 tablet (10 mg total) daily for 7 days.  Dispense: 21 tablet; Refill: 0 -     DULoxetine HCl; Take 1 capsule (30 mg total) by mouth daily for 14 days, THEN 2 capsules (60 mg total) daily.  Dispense: 60 capsule; Refill: 3  Other chronic pain Assessment & Plan: Chronic issue.  Patient will continue to follow with her pain management specialist.  Discussed that getting on the Cymbalta may be helpful for her pain as well.      Health Maintenance: Patient declines colonoscopy, mammogram, and Pap smear due to financial concerns.  She does report having filled out the financial assistance paperwork and is waiting on a response.  Return in about 2 months (around 10/14/2023) for Transfer of care with [facility]. Patient to transfer care while I am still here so that I will be available to her next provider if they have questions.   [name], MD [facility]

## 2023-08-14 NOTE — Patient Instructions (Signed)
Nice to see you. We are going to taper you off Lexapro.   You will take Lexapro 20 mg (2 tablets) by mouth daily for 7 days.  You will then reduce your Lexapro dose to 10 mg (1 tablet) daily by mouth for 7 days and at the same time we will start on Cymbalta 30 mg (1 tablet) by mouth daily for 14 days.  After that 14 days you will increase the Cymbalta to 60 mg (2 tablets) by mouth daily.  If you notice any odd symptoms with making these changes please let us know right away.

## 2023-08-14 NOTE — Assessment & Plan Note (Signed)
Chronic issue.  Patient will continue to follow with her pain management specialist.  Discussed that getting on the Cymbalta may be helpful for her pain as well.

## 2023-08-14 NOTE — Assessment & Plan Note (Signed)
Chronic issue.  Uncontrolled.  I believe there may have been some misunderstanding on dosing of her Lexapro during her last visit.  Discussed that 40 mg of Lexapro is too much.  We are going to taper her off of Lexapro and switch her to Cymbalta to see if we can get benefit for her pain as well.  She will reduce Lexapro to 20 mg daily for 1 week and then reduce the dose to 10 mg daily for 1 week.  At the same time as reducing the Lexapro to 10 mg she will start on Cymbalta 30 mg daily for 2 weeks and then increase the Cymbalta to 60 mg daily.  If she has any withdrawal symptoms she will let us know.  She will also continue Wellbutrin 150 mg daily and she can continue hydroxyzine 10 mg 3 times daily as needed for anxiety.  She will follow-up in 2 months for transfer of care.

## 2023-08-15 ENCOUNTER — Other Ambulatory Visit: Payer: Self-pay

## 2023-08-15 MED ORDER — POTASSIUM CHLORIDE CRYS ER 20 MEQ PO TBCR
20.0000 meq | EXTENDED_RELEASE_TABLET | Freq: Every day | ORAL | 0 refills | Status: DC
  Filled 2023-08-15: qty 90, 90d supply, fill #0

## 2023-08-15 MED ORDER — EZETIMIBE 10 MG PO TABS
10.0000 mg | ORAL_TABLET | Freq: Every day | ORAL | 2 refills | Status: DC
  Filled 2023-08-15 – 2023-09-06 (×2): qty 30, 30d supply, fill #0
  Filled 2023-10-03: qty 30, 30d supply, fill #1
  Filled 2023-11-27: qty 30, 30d supply, fill #2

## 2023-08-15 NOTE — Telephone Encounter (Signed)
Last visit 07/13/23 with plan to f/u in  6 weeks.    Next visit: 08/27/23

## 2023-08-16 ENCOUNTER — Other Ambulatory Visit: Payer: Self-pay

## 2023-08-20 ENCOUNTER — Other Ambulatory Visit: Payer: Self-pay

## 2023-08-20 MED ORDER — BUPROPION HCL ER (XL) 150 MG PO TB24
150.0000 mg | ORAL_TABLET | Freq: Every day | ORAL | 0 refills | Status: DC
  Filled 2023-08-20 – 2023-09-06 (×2): qty 30, 30d supply, fill #0

## 2023-08-22 NOTE — Progress Notes (Signed)
Remote ICD transmission.   

## 2023-08-27 ENCOUNTER — Encounter: Payer: Self-pay | Admitting: Medical

## 2023-08-27 ENCOUNTER — Ambulatory Visit: Payer: Self-pay | Attending: Medical | Admitting: Medical

## 2023-08-27 ENCOUNTER — Other Ambulatory Visit: Payer: Self-pay

## 2023-08-27 VITALS — BP 110/78 | HR 86 | Ht 65.0 in | Wt 190.5 lb

## 2023-08-27 DIAGNOSIS — I10 Essential (primary) hypertension: Secondary | ICD-10-CM

## 2023-08-27 DIAGNOSIS — I2510 Atherosclerotic heart disease of native coronary artery without angina pectoris: Secondary | ICD-10-CM

## 2023-08-27 DIAGNOSIS — E782 Mixed hyperlipidemia: Secondary | ICD-10-CM

## 2023-08-27 DIAGNOSIS — Z79899 Other long term (current) drug therapy: Secondary | ICD-10-CM

## 2023-08-27 DIAGNOSIS — I255 Ischemic cardiomyopathy: Secondary | ICD-10-CM

## 2023-08-27 DIAGNOSIS — I5022 Chronic systolic (congestive) heart failure: Secondary | ICD-10-CM

## 2023-08-27 DIAGNOSIS — G8929 Other chronic pain: Secondary | ICD-10-CM

## 2023-08-27 DIAGNOSIS — M549 Dorsalgia, unspecified: Secondary | ICD-10-CM

## 2023-08-27 DIAGNOSIS — R0602 Shortness of breath: Secondary | ICD-10-CM

## 2023-08-27 MED ORDER — EMPAGLIFLOZIN 10 MG PO TABS
10.0000 mg | ORAL_TABLET | Freq: Every day | ORAL | 3 refills | Status: DC
  Filled 2023-08-27: qty 90, 90d supply, fill #0

## 2023-08-27 NOTE — Patient Instructions (Addendum)
Medication Instructions:  Your physician recommends the following medication changes.  START TAKING: Jardiance 10 mg by mouth daily   *If you need a refill on your cardiac medications before your next appointment, please call your pharmacy*   Lab Work: Your provider would like for you to have a CBC drawn today, then return in 2 weeks for a  BMP.   Please go to [facility] [address] ([facility] [address], [place] [address] You do not need an appointment.  They are open from 7:30 am-4 pm.  Lunch from 1:00 pm- 2:00 pm You will not need to be fasting.  Testing/Procedures: No test ordered today    Follow-Up: At [facility], you and your health needs are our priority.  As part of our continuing mission to provide you with exceptional heart care, we have created designated Provider Care Teams.  These Care Teams include your primary Cardiologist (physician) and Advanced Practice Providers (APPs -  Physician Assistants and Nurse Practitioners) who all work together to provide you with the care you need, when you need it.  We recommend signing up for the patient portal called "MyChart".  Sign up information is provided on this After Visit Summary.  MyChart is used to connect with patients for Virtual Visits (Telemedicine).  Patients are able to view lab/test results, encounter notes, upcoming appointments, etc.  Non-urgent messages can be sent to your provider as well.   To learn more about what you can do with MyChart, go to [email or web address].    Your next appointment:   3 month(s)  Provider:   You may see Yvonne Kendall, MD or one of the following Advanced Practice Providers on your designated Care Team:   Nicolasa Ducking, NP Eula Listen, PA-C Cadence Fransico Michael, PA-C Charlsie Quest, NP Carlos Levering, NP

## 2023-08-27 NOTE — Progress Notes (Addendum)
Cardiology Office Note:    Date:  08/27/2023   ID:  Crystal Roberson, DOB 1966/08/21, MRN [id number]  PCP:  Glori Luis, MD  [facility] Cardiologist:  Yvonne Kendall, MD  [facility] Electrophysiologist:  None   Referring MD: Glori Luis, MD   Chief Complaint: 1 month follow-up  History of Present Illness:    Crystal Roberson is a 57 y.o. female with a hx of CAD s/p anterior STEMI in 08/2016, chronic systolic heart failure, ICM s/p ICD (05/2016), HTN with suspicion for hyperaldosteronism, migraine headaches, degenerative disc disease with chronic back pain, and depression who is being seen for follow-up.   The patient was last seen 06/2023 and reported fatigue, SOB, intolerance ot morphine, back pain and hypotension. She also reported an episode of chest pain that felt like pressure on the front of her chest pain, different than her heart attack. Echocardiogram showed LVEF 35-40%, normal RVSF, mild MR.   Today, the echo was reviewed. She reports worsening SOB. Breathing is worse at the end of the night. She has not been walking much due to back pain. She has gained weight. She denies chest pain pain. She denies lower leg edema, palpitations, orthopnea, or pnd.    Past Medical History:  Diagnosis Date   Allergy    CAD (coronary artery disease)    a. 08/2016 Ant STEMI/PCI: LAD 26m (2.25x20 Promus Premier DES); b. 04/2017 Cath: LM nl, LAD 20ost, 70ost->vasospasm, Patent mid stent, 50d, D1/2/3 small, LCX 20ost, 24m, OM1/2/3 small, RCA 78m, 20d, EF 30-35%, mild to mod MR.   Chicken pox    Chronic back pain    Chronic fatigue    DDD (degenerative disc disease), lumbar    Depression    HFrEF (heart failure with reduced ejection fraction) (HCC)    a. 01/2017 Echo: Ef 35-40%, septal apical, distal inf, anterior HK. Mod dil LV. Mild to mod MR. mildly dil LA; b. 04/2017 LV Gram: EF 30-35%.   Hyperlipidemia LDL goal <70    Hypertension    Ischemic cardiomyopathy    a. 01/2017 Echo:  EF 35-40%; b. 04/2017 LV Gram: EF 30-35%; c. 05/2017 s/p SJM [id number] Ellipse VR RV only AICD.   Migraines    Syncope 09/12/2016    Past Surgical History:  Procedure Laterality Date   CARDIAC CATHETERIZATION N/A 09/23/2016   Procedure: Left Heart Cath and Coronary Angiography;  Surgeon: Yvonne Kendall, MD;  Location: [facility] INVASIVE CV LAB;  Service: Cardiovascular;  Laterality: N/A;   CARDIAC CATHETERIZATION N/A 09/23/2016   Procedure: Coronary Stent Intervention;  Surgeon: Yvonne Kendall, MD;  Location: [facility] INVASIVE CV LAB;  Service: Cardiovascular;  Laterality: N/A;   CERVICAL DISC SURGERY     ICD IMPLANT N/A 05/31/2017   Procedure: ICD Implant;  Surgeon: Regan Lemming, MD;  Location: [facility] INVASIVE CV LAB;  Service: Cardiovascular;  Laterality: N/A;   RIGHT/LEFT HEART CATH AND CORONARY ANGIOGRAPHY N/A 04/26/2017   Procedure: Right/Left Heart Cath and Coronary Angiography;  Surgeon: Yvonne Kendall, MD;  Location: [facility] INVASIVE CV LAB;  Service: Cardiovascular;  Laterality: N/A;   TUBAL LIGATION     ULTRASOUND GUIDANCE FOR VASCULAR ACCESS  04/26/2017   Procedure: Ultrasound Guidance For Vascular Access;  Surgeon: Yvonne Kendall, MD;  Location: [facility] INVASIVE CV LAB;  Service: Cardiovascular;;    Current Medications: Current Meds  Medication Sig   buPROPion (WELLBUTRIN XL) 150 MG 24 hr tablet Take 1 tablet (150 mg total) by mouth once daily.   carvedilol (  COREG) 6.25 MG tablet Take 1 tablet (6.25 mg total) by mouth 2 (two) times daily. PLEASE CALL OFFICE TO SCHEDULE APPOINTMENT PRIOR TO NEXT REFILL   clopidogrel (PLAVIX) 75 MG tablet Take 1 tablet (75 mg total) by mouth once daily.   empagliflozin (JARDIANCE) 10 MG TABS tablet Take 1 tablet (10 mg total) by mouth daily before breakfast.   escitalopram (LEXAPRO) 10 MG tablet Take 2 tablets (20 mg total) by mouth daily for 7 days, THEN 1 tablet (10 mg total) daily for 7 days. (Patient taking differently: 1 tablet (10 mg total) daily for 7 days.)    ezetimibe (ZETIA) 10 MG tablet Take 1 tablet (10 mg total) by mouth daily.   furosemide (LASIX) 20 MG tablet Take 1 tablet (20 mg total) by mouth once daily as needed.   hydrOXYzine (ATARAX) 10 MG tablet Take 1 tablet (10 mg total) by mouth every 8 (eight) hours as needed for anxiety.   lidocaine (LIDODERM) 5 % Place 2-3 patches onto the skin every 12 (twelve) hours as needed (pain). Remove & Discard patch within 12 hours or as directed by MD   morphine (MS CONTIN) 15 MG 12 hr tablet Take 15 mg by mouth every 8 (eight) hours.    nitroGLYCERIN (NITROSTAT) 0.4 MG SL tablet Dissolve 1 tablet (0.4 mg total) under the tongue once every 5 (five) minutes as needed for chest pain. Max of 3 doses in 15 minutes.   potassium chloride SA (KLOR-CON M) 20 MEQ tablet Take 1 tablet (20 mEq total) by mouth daily. (IF YOU TAKE LASIX TAKE AN EXTRA DOSE AS DIRECTED)   rosuvastatin (CRESTOR) 40 MG tablet Take 1 tablet (40 mg total) by mouth once daily.   spironolactone (ALDACTONE) 50 MG tablet Take 1 tablet (50 mg total) by mouth daily.   tiZANidine (ZANAFLEX) 4 MG tablet Take 4-8 mg by mouth daily.     Allergies:   Pregabalin and Codeine   Social History   Socioeconomic History   Marital status: Divorced    Spouse name: Not on file   Number of children: Not on file   Years of education: Not on file   Highest education level: Not on file  Occupational History   Not on file  Tobacco Use   Smoking status: Former    Current packs/day: 0.00    Average packs/day: 0.8 packs/day for 30.0 years (22.5 ttl pk-yrs)    Types: Cigarettes    Start date: 09/23/1986    Quit date: 09/23/2016    Years since quitting: 6.9   Smokeless tobacco: Never  Vaping Use   Vaping status: Never Used  Substance and Sexual Activity   Alcohol use: No    Alcohol/week: 0.0 standard drinks of alcohol   Drug use: No   Sexual activity: Not Currently  Other Topics Concern   Not on file  Social History Narrative   Not on file    Social Determinants of Health   Financial Resource Strain: Not on file  Food Insecurity: Not on file  Transportation Needs: Not on file  Physical Activity: Not on file  Stress: Not on file  Social Connections: Not on file     Family History: The patient's family history includes Arthritis in her mother; Heart disease in her maternal grandmother; Hyperlipidemia in her mother; Hypertension in her maternal grandmother and mother; Mental illness in her mother.  ROS:   Please see the history of present illness.     All other systems reviewed and are  negative.  EKGs/Labs/Other Studies Reviewed:    The following studies were reviewed today:  Echo 07/2023 1. Left ventricular ejection fraction, by estimation, is 35 to 40%. The  left ventricle has moderately decreased function. The left ventricle  demonstrates regional wall motion abnormalities (see scoring  diagram/findings for description). Left ventricular   diastolic parameters are indeterminate. There is akinesis of the left  ventricular, entire anteroseptal wall and apical segment.   2. Right ventricular systolic function is normal. The right ventricular  size is normal.   3. Left atrial size was mildly dilated.   4. The mitral valve is normal in structure. Mild mitral valve  regurgitation.   5. The aortic valve was not well visualized. Aortic valve regurgitation  is not visualized.   EKG:  EKG is not ordered today.    Recent Labs: 01/31/2023: ALT 26 07/02/2023: BUN 13; Creatinine, Ser 1.14; Potassium 3.8; Sodium 139  Recent Lipid Panel    Component Value Date/Time   CHOL 129 01/31/2023 1014   CHOL 109 11/24/2021 0846   TRIG 78.0 01/31/2023 1014   HDL 59.00 01/31/2023 1014   HDL 59 11/24/2021 0846   CHOLHDL 2 01/31/2023 1014   VLDL 15.6 01/31/2023 1014   LDLCALC 55 01/31/2023 1014   LDLCALC 33 11/24/2021 0846      Physical Exam:    VS:  BP 110/78 (BP Location: Left Arm, Patient Position: Sitting, Cuff Size:  Large)   Pulse 86   Ht 5\' 5"  (1.651 m)   Wt 190 lb 8 oz (86.4 kg)   SpO2 98%   BMI 31.70 kg/m     Wt Readings from Last 3 Encounters:  08/27/23 190 lb 8 oz (86.4 kg)  08/14/23 191 lb (86.6 kg)  07/13/23 190 lb 12.8 oz (86.5 kg)     GEN:  Well nourished, well developed in no acute distress HEENT: Normal NECK: No JVD; No carotid bruits LYMPHATICS: No lymphadenopathy CARDIAC:  RRR, no murmurs, rubs, gallops RESPIRATORY:  Clear to auscultation without rales, wheezing or rhonchi  ABDOMEN: Soft, non-tender, non-distended MUSCULOSKELETAL:  No edema; No deformity  SKIN: Warm and dry NEUROLOGIC:  Alert and oriented x 3 PSYCHIATRIC:  Normal affect   ASSESSMENT:    1. SOB (shortness of breath) on exertion   2. Coronary artery disease involving native coronary artery of native heart without angina pectoris   3. Chronic systolic heart failure (HCC)   4. Ischemic cardiomyopathy   5. Chronic back pain, unspecified back location, unspecified back pain laterality   6. Hyperlipidemia, mixed   7. Essential hypertension   8. Medication management    PLAN:    In order of problems listed above:  SOB CAD Repeat echo showed essentially unchanged LVEF 35-40% (prior 40-45%), mild MR.  Patient reports persistent shortness of breath that is mainly at night after getting ready for bed.  She denies any chest pain.  Patient is euvolemic on exam.  Last heart cath was in 2018 showing patent mLAD stent and otherwise nonobstructive disease.  Will continue with medications at this point.  If symptoms continue or worsen, may consider ischemic evaluation at follow-up.Continue Plavix 75mg  daily, Zetia 10mg  daily, Crestor 40mg  daily.   Chronic HFrEF/ICM Recent echo showed LVEF 35 to 40% as above.  She is euvolemic on exam.  Patient takes Lasix with potassium as needed for lower leg edema.  Continue spironolactone 40 mg daily. I will start Jardiance 10mg  daily, BMET in 2 weeks.  Chronic back pain This  greatly  limits her function.  HLD LDL 55. Continue Crestor and Zetia.   HTN Blood pressure today is normal, continue coreg 6.25mg BID.  Disposition: Follow up in 3 month(s) with MD/APP    Signed, Gailyn Crook David Stall, PA-C  08/27/2023 11:00 AM    [facility]

## 2023-08-28 ENCOUNTER — Other Ambulatory Visit: Payer: Self-pay

## 2023-08-28 LAB — CBC
Hematocrit: 42.7 % (ref 34.0–46.6)
Hemoglobin: 14.2 g/dL (ref 11.1–15.9)
MCH: 30.3 pg (ref 26.6–33.0)
MCHC: 33.3 g/dL (ref 31.5–35.7)
MCV: 91 fL (ref 79–97)
Platelets: 156 10*3/uL (ref 150–450)
RBC: 4.69 x10E6/uL (ref 3.77–5.28)
RDW: 12.5 % (ref 11.7–15.4)
WBC: 7.9 10*3/uL (ref 3.4–10.8)

## 2023-09-04 ENCOUNTER — Other Ambulatory Visit: Payer: Self-pay

## 2023-09-05 ENCOUNTER — Other Ambulatory Visit: Payer: Self-pay

## 2023-09-05 MED ORDER — MORPHINE SULFATE ER 15 MG PO TBCR
15.0000 mg | EXTENDED_RELEASE_TABLET | Freq: Three times a day (TID) | ORAL | 0 refills | Status: AC
  Filled 2023-09-05: qty 90, 30d supply, fill #0

## 2023-09-06 ENCOUNTER — Other Ambulatory Visit: Payer: Self-pay

## 2023-09-06 ENCOUNTER — Other Ambulatory Visit: Payer: Self-pay | Admitting: Internal Medicine

## 2023-09-07 ENCOUNTER — Other Ambulatory Visit: Payer: Self-pay

## 2023-09-07 MED ORDER — CLOPIDOGREL BISULFATE 75 MG PO TABS
75.0000 mg | ORAL_TABLET | Freq: Every day | ORAL | 0 refills | Status: DC
  Filled 2023-09-07: qty 90, 90d supply, fill #0

## 2023-09-07 MED ORDER — CARVEDILOL 6.25 MG PO TABS
6.2500 mg | ORAL_TABLET | Freq: Two times a day (BID) | ORAL | 0 refills | Status: DC
  Filled 2023-09-07: qty 151, 76d supply, fill #0
  Filled 2023-12-28: qty 151, 76d supply, fill #1

## 2023-09-07 MED ORDER — ROSUVASTATIN CALCIUM 40 MG PO TABS
40.0000 mg | ORAL_TABLET | Freq: Every day | ORAL | 0 refills | Status: DC
  Filled 2023-09-07 – 2023-10-03 (×2): qty 90, 90d supply, fill #0

## 2023-09-18 ENCOUNTER — Other Ambulatory Visit: Payer: Self-pay

## 2023-09-28 ENCOUNTER — Other Ambulatory Visit: Payer: Self-pay

## 2023-09-28 MED ORDER — MORPHINE SULFATE ER 15 MG PO TBCR
15.0000 mg | EXTENDED_RELEASE_TABLET | Freq: Three times a day (TID) | ORAL | 0 refills | Status: AC
  Filled 2023-11-12 – 2023-11-14 (×2): qty 90, 30d supply, fill #0

## 2023-09-28 MED ORDER — LIDOCAINE 5 % EX PTCH
1.0000 | MEDICATED_PATCH | Freq: Two times a day (BID) | CUTANEOUS | 3 refills | Status: AC
  Filled 2024-05-23: qty 30, 5d supply, fill #0
  Filled 2024-05-23: qty 90, 15d supply, fill #0
  Filled 2024-08-01: qty 30, 10d supply, fill #1

## 2023-09-28 MED ORDER — MORPHINE SULFATE ER 15 MG PO TBCR
15.0000 mg | EXTENDED_RELEASE_TABLET | Freq: Three times a day (TID) | ORAL | 0 refills | Status: AC
  Filled 2023-11-12 – 2024-01-15 (×2): qty 90, 30d supply, fill #0

## 2023-09-28 MED ORDER — MORPHINE SULFATE ER 15 MG PO TBCR
15.0000 mg | EXTENDED_RELEASE_TABLET | Freq: Three times a day (TID) | ORAL | 0 refills | Status: DC
  Filled 2023-10-05: qty 75, 25d supply, fill #0
  Filled 2023-10-05: qty 15, 5d supply, fill #0
  Filled ????-??-??: fill #0

## 2023-10-03 ENCOUNTER — Other Ambulatory Visit (HOSPITAL_COMMUNITY): Payer: Self-pay

## 2023-10-03 ENCOUNTER — Other Ambulatory Visit: Payer: Self-pay

## 2023-10-03 ENCOUNTER — Other Ambulatory Visit: Payer: Self-pay | Admitting: Family Medicine

## 2023-10-03 ENCOUNTER — Telehealth: Payer: Self-pay | Admitting: Internal Medicine

## 2023-10-03 DIAGNOSIS — I10 Essential (primary) hypertension: Secondary | ICD-10-CM

## 2023-10-03 DIAGNOSIS — F331 Major depressive disorder, recurrent, moderate: Secondary | ICD-10-CM

## 2023-10-03 DIAGNOSIS — E269 Hyperaldosteronism, unspecified: Secondary | ICD-10-CM

## 2023-10-03 MED ORDER — MORPHINE SULFATE 15 MG PO TABS
15.0000 mg | ORAL_TABLET | Freq: Three times a day (TID) | ORAL | 0 refills | Status: DC
  Filled 2023-10-10: qty 90, 30d supply, fill #0

## 2023-10-03 NOTE — Telephone Encounter (Signed)
 Pt c/o medication issue:  1. Name of Medication:   spironolactone  (ALDACTONE ) 50 MG tablet    2. How are you currently taking this medication (dosage and times per day)? Take 1 tablet (50 mg total) by mouth daily.   3. Are you having a reaction (difficulty breathing--STAT)? No  4. What is your medication issue? Patient is calling because she was informed that this medication has been discontinued. Please advise.

## 2023-10-03 NOTE — Telephone Encounter (Signed)
 Returned the call to the patient. She was calling concerning the spironolactone . She stated that her pharmacy told her this was discontinued.  Per the chart, she is on 50 mg daily and this was prescribed by Dr. Maribeth.  Per the last office visit with Cadence Franchester, she was on 40 mg once daily.  The patient stated that she has not been on it since last November. She said she was taking 12.5 mg bid the last time she had it.   She denies swelling and stated that her blood pressures fluctuates but was unable to give any readings.

## 2023-10-04 ENCOUNTER — Other Ambulatory Visit: Payer: Self-pay

## 2023-10-04 ENCOUNTER — Other Ambulatory Visit: Payer: Self-pay | Admitting: Family Medicine

## 2023-10-04 DIAGNOSIS — F331 Major depressive disorder, recurrent, moderate: Secondary | ICD-10-CM

## 2023-10-04 MED FILL — Bupropion HCl Tab ER 24HR 150 MG: ORAL | 30 days supply | Qty: 30 | Fill #0 | Status: AC

## 2023-10-05 ENCOUNTER — Other Ambulatory Visit: Payer: Self-pay

## 2023-10-05 NOTE — Telephone Encounter (Signed)
 I suspect spironolactone 40 mg was a typo, as the medication does not come in that strength.  I suggest we have her start back on spironolactone 12.5 mg daily with repeat BMP in ~1 week after resuming the medication.  Yvonne Kendall, MD [facility]

## 2023-10-08 ENCOUNTER — Other Ambulatory Visit: Payer: Self-pay

## 2023-10-09 ENCOUNTER — Other Ambulatory Visit (HOSPITAL_COMMUNITY): Payer: Self-pay

## 2023-10-09 MED ORDER — SPIRONOLACTONE 25 MG PO TABS
12.5000 mg | ORAL_TABLET | Freq: Every day | ORAL | 3 refills | Status: DC
Start: 2023-10-09 — End: 2023-11-28
  Filled 2023-10-09: qty 45, 90d supply, fill #0

## 2023-10-09 NOTE — Telephone Encounter (Signed)
 Pt made aware and verbalized understanding. Spirolactone 12.5 mg sent to requested pharmacy and repeat lab order placed.

## 2023-10-10 ENCOUNTER — Other Ambulatory Visit: Payer: Self-pay

## 2023-10-19 ENCOUNTER — Other Ambulatory Visit (HOSPITAL_COMMUNITY): Payer: Self-pay

## 2023-10-25 ENCOUNTER — Encounter: Payer: Self-pay | Admitting: Nurse Practitioner

## 2023-11-07 ENCOUNTER — Other Ambulatory Visit: Payer: Self-pay

## 2023-11-08 ENCOUNTER — Ambulatory Visit (INDEPENDENT_AMBULATORY_CARE_PROVIDER_SITE_OTHER): Payer: Self-pay

## 2023-11-08 DIAGNOSIS — I255 Ischemic cardiomyopathy: Secondary | ICD-10-CM

## 2023-11-09 ENCOUNTER — Other Ambulatory Visit: Payer: Self-pay

## 2023-11-09 LAB — CUP PACEART REMOTE DEVICE CHECK
Battery Remaining Longevity: 53 mo
Battery Remaining Percentage: 51 %
Battery Voltage: 2.95 V
Brady Statistic RV Percent Paced: 1 %
Date Time Interrogation Session: 20250213020023
HighPow Impedance: 78 Ohm
HighPow Impedance: 78 Ohm
Implantable Lead Connection Status: 753985
Implantable Lead Implant Date: 20180906
Implantable Lead Location: 753860
Implantable Pulse Generator Implant Date: 20180906
Lead Channel Impedance Value: 390 Ohm
Lead Channel Pacing Threshold Amplitude: 1 V
Lead Channel Pacing Threshold Pulse Width: 0.5 ms
Lead Channel Sensing Intrinsic Amplitude: 12 mV
Lead Channel Setting Pacing Amplitude: 2.5 V
Lead Channel Setting Pacing Pulse Width: 0.5 ms
Lead Channel Setting Sensing Sensitivity: 0.5 mV
Pulse Gen Serial Number: 9783396

## 2023-11-12 ENCOUNTER — Other Ambulatory Visit (HOSPITAL_BASED_OUTPATIENT_CLINIC_OR_DEPARTMENT_OTHER): Payer: Self-pay

## 2023-11-12 ENCOUNTER — Other Ambulatory Visit: Payer: Self-pay

## 2023-11-12 ENCOUNTER — Other Ambulatory Visit (HOSPITAL_COMMUNITY): Payer: Self-pay

## 2023-11-13 ENCOUNTER — Other Ambulatory Visit: Payer: Self-pay

## 2023-11-14 ENCOUNTER — Other Ambulatory Visit: Payer: Self-pay

## 2023-11-14 MED ORDER — MORPHINE SULFATE 15 MG PO TABS
15.0000 mg | ORAL_TABLET | Freq: Three times a day (TID) | ORAL | 0 refills | Status: DC
  Filled 2023-11-14: qty 90, 30d supply, fill #0

## 2023-11-15 ENCOUNTER — Other Ambulatory Visit: Payer: Self-pay

## 2023-11-21 ENCOUNTER — Other Ambulatory Visit: Payer: Self-pay

## 2023-11-21 MED ORDER — MORPHINE SULFATE ER 15 MG PO TBCR: 15.0000 mg | EXTENDED_RELEASE_TABLET | Freq: Three times a day (TID) | ORAL | 0 refills | Status: DC

## 2023-11-21 MED ORDER — MORPHINE SULFATE ER 15 MG PO TBCR
15.0000 mg | EXTENDED_RELEASE_TABLET | Freq: Three times a day (TID) | ORAL | 0 refills | Status: DC
  Filled 2023-11-21 – 2023-12-14 (×2): qty 90, 30d supply, fill #0

## 2023-11-27 ENCOUNTER — Other Ambulatory Visit: Payer: Self-pay

## 2023-11-28 ENCOUNTER — Encounter: Payer: Self-pay | Admitting: Internal Medicine

## 2023-11-28 ENCOUNTER — Telehealth: Payer: Self-pay | Admitting: Pharmacy Technician

## 2023-11-28 ENCOUNTER — Other Ambulatory Visit: Payer: Self-pay

## 2023-11-28 ENCOUNTER — Other Ambulatory Visit (HOSPITAL_COMMUNITY): Payer: Self-pay

## 2023-11-28 ENCOUNTER — Other Ambulatory Visit
Admission: RE | Admit: 2023-11-28 | Discharge: 2023-11-28 | Disposition: A | Discharge status: Home / Self Care | Payer: Self-pay | Source: Ambulatory Visit | Attending: Internal Medicine | Admitting: Internal Medicine

## 2023-11-28 ENCOUNTER — Ambulatory Visit: Payer: Self-pay | Attending: Internal Medicine | Admitting: Internal Medicine

## 2023-11-28 VITALS — BP 144/96 | HR 71 | Ht 65.0 in | Wt 193.6 lb

## 2023-11-28 DIAGNOSIS — I10 Essential (primary) hypertension: Secondary | ICD-10-CM

## 2023-11-28 DIAGNOSIS — I5022 Chronic systolic (congestive) heart failure: Secondary | ICD-10-CM

## 2023-11-28 DIAGNOSIS — E785 Hyperlipidemia, unspecified: Secondary | ICD-10-CM

## 2023-11-28 DIAGNOSIS — G8929 Other chronic pain: Secondary | ICD-10-CM

## 2023-11-28 DIAGNOSIS — E269 Hyperaldosteronism, unspecified: Secondary | ICD-10-CM

## 2023-11-28 DIAGNOSIS — M545 Low back pain, unspecified: Secondary | ICD-10-CM

## 2023-11-28 DIAGNOSIS — I152 Hypertension secondary to endocrine disorders: Secondary | ICD-10-CM

## 2023-11-28 DIAGNOSIS — Z79899 Other long term (current) drug therapy: Secondary | ICD-10-CM

## 2023-11-28 DIAGNOSIS — I2510 Atherosclerotic heart disease of native coronary artery without angina pectoris: Secondary | ICD-10-CM

## 2023-11-28 LAB — BASIC METABOLIC PANEL
Anion gap: 6 (ref 5–15)
BUN: 13 mg/dL (ref 6–20)
CO2: 29 mmol/L (ref 22–32)
Calcium: 8.6 mg/dL — ABNORMAL LOW (ref 8.9–10.3)
Chloride: 105 mmol/L (ref 98–111)
Creatinine, Ser: 0.85 mg/dL (ref 0.44–1.00)
GFR, Estimated: >60 mL/min (ref >60)
Glucose, Bld: 109 mg/dL — ABNORMAL HIGH (ref 70–99)
Potassium: 4.2 mmol/L (ref 3.5–5.1)
Sodium: 140 mmol/L (ref 135–145)

## 2023-11-28 MED ORDER — EMPAGLIFLOZIN 10 MG PO TABS: 10.0000 mg | ORAL_TABLET | Freq: Every day | ORAL | Status: AC

## 2023-11-28 MED ORDER — SPIRONOLACTONE 25 MG PO TABS
12.5000 mg | ORAL_TABLET | Freq: Every day | ORAL | 3 refills | Status: AC
  Filled 2023-11-28: qty 45, 90d supply, fill #0
  Filled 2024-02-24: qty 45, 90d supply, fill #1
  Filled 2024-07-15: qty 45, 90d supply, fill #2
  Filled 2024-10-14 – 2024-10-16 (×2): qty 45, 90d supply, fill #3

## 2023-11-28 NOTE — Progress Notes (Signed)
 Cardiology Office Note:  .   Date:  11/28/2023  ID:  Crystal Roberson, DOB 1966-08-18, MRN [id number] PCP: Glori Luis, MD (Inactive)  [facility] Providers Cardiologist:  Yvonne Kendall, MD     History of Present Illness: .   Crystal Roberson is a 58 y.o. female with history of coronary artery disease status post anterior STEMI in 08/2016, chronic systolic heart failure due to ischemic cardiomyopathy status post ICD (05/2016), hypertension with suspicion for hyperaldosteronism, migraine headaches, degenerative disc disease with chronic back pain, and depression, who presents for follow-up of CAD and HFrEF.  Crystal Roberson was last seen in our office in December by Terrilee Croak, [place], at which time Crystal Roberson complained of increasing shortness of breath.  Preceding echocardiogram showed LVEF of 35-40% with anteroseptal and apical akinesis.  Crystal Roberson was started on empagliflozin 10 mg daily.  Today comes Crystal Roberson reports that Crystal Roberson feels similar to prior visits.  Crystal Roberson blood pressure is still up and down with periodic "episodes" during which time Crystal Roberson blood pressure dropped significantly and Crystal Roberson feels dizzy.  Crystal Roberson has not had any further falls or syncope.  Crystal Roberson notes that Crystal Roberson blood pressure at a recent pain management visit was as high as 211/119.  Crystal Roberson denies chest pain.  Chronic dyspnea on exertion is unchanged from baseline.  Crystal Roberson has not had any lower extremity edema or palpitations.  Crystal Roberson has been unable to begin empagliflozin as Crystal Roberson is still waiting on medication assistance.  Crystal Roberson also ran out of spironolactone a couple of months ago.  Crystal Roberson recently injured Crystal Roberson right wrist but has not been able to get it evaluated due to lack of insurance.  ROS: See HPI  Studies Reviewed: Marland Kitchen       TTE (07/30/2023): Normal LV size and wall thickness.  LVEF 35-40% with anteroseptal and apical akinesis.  Normal RV size and function.  Mild left atrial enlargement.  No pericardial effusion.  Mild mitral and tricuspid regurgitation.  Risk  Assessment/Calculations:         Physical Exam:   VS:  BP (!) 144/96   Pulse 71   Ht 5\' 5"  (1.651 m)   Wt 193 lb 9.6 oz (87.8 kg)   SpO2 96%   BMI 32.22 kg/m    Wt Readings from Last 3 Encounters:  11/28/23 193 lb 9.6 oz (87.8 kg)  08/27/23 190 lb 8 oz (86.4 kg)  08/14/23 191 lb (86.6 kg)    General:  NAD. Neck: No JVD or HJR. Lungs: Clear to auscultation bilaterally without wheezes or crackles. Heart: Regular rate and rhythm without murmurs, rubs, or gallops. Abdomen: Soft, nontender, nondistended. Extremities: No lower extremity edema.  ASSESSMENT AND PLAN: .    Coronary artery disease: No angina reported in the setting of prior anterior STEMI status post PCI to the LAD.  Continue secondary prevention with clopidogrel, ezetimibe, and rosuvastatin.  Chronic HFrEF: Crystal Roberson has stable or slightly worsening exertional dyspnea in the setting of labile blood pressures and limited GDMT.  Crystal Roberson remains on carvedilol 6.25 mg twice daily but has been off spironolactone for a couple of months.  Crystal Roberson has yet to receive empagliflozin as well.  Crystal Roberson was previously on low-dose ACE inhibitor and ARB therapy, though this had been held due to concerns that it was contributing to blood pressure drops and lightheadedness.  In the setting of Crystal Roberson suspected hyperaldosteronism, we will resume spironolactone 12.5 milligrams daily.  I will check a BMP today to ensure stable renal function and  electrolytes with likely repeat labs in about a month.  Will also reach out to our pharmacy team to see if assistance can be obtained for empagliflozin.  Based on response to these medications and blood pressures, I would like to rechallenge Crystal Roberson with low-dose lisinopril in the future.  Continue ICD follow-up through device clinic.  Hypertension and hyperaldosteronism: Blood pressure mildly elevated today and still quite labile at home.  As above, we will resume low-dose spironolactone and continue Crystal Roberson current dose of  carvedilol.  Check BMP today and likely again in about a month.  If blood pressure allows, I would like to add a low-dose lisinopril again in the future.  Hyperlipidemia: LDL excellent at 55 on last check in 01/2023.  Continue ezetimibe and rosuvastatin.  Chronic back pain: This remains a significant barrier to his [place] increasing Crystal Roberson activity.  Continue follow-up in the pain management clinic.    Dispo: Return to clinic in 3 months.  Signed, Yvonne Kendall, MD

## 2023-11-28 NOTE — Patient Instructions (Signed)
 Medication Instructions:  Your physician recommends that you continue on your current medications as directed. Please refer to the Current Medication list given to you today.   *If you need a refill on your cardiac medications before your next appointment, please call your pharmacy*   Lab Work: Your provider would like for you to have following labs drawn today (BMP).     Testing/Procedures: No test ordered today    Follow-Up: At [facility], you and your health needs are our priority.  As part of our continuing mission to provide you with exceptional heart care, we have created designated Provider Care Teams.  These Care Teams include your primary Cardiologist (physician) and Advanced Practice Providers (APPs -  Physician Assistants and Nurse Practitioners) who all work together to provide you with the care you need, when you need it.  We recommend signing up for the patient portal called "MyChart".  Sign up information is provided on this After Visit Summary.  MyChart is used to connect with patients for Virtual Visits (Telemedicine).  Patients are able to view lab/test results, encounter notes, upcoming appointments, etc.  Non-urgent messages can be sent to your provider as well.   To learn more about what you can do with MyChart, go to [email or web address].    Your next appointment:   3 month(s)  Provider:   You may see Yvonne Kendall, MD or one of the following Advanced Practice Providers on your designated Care Team:   Nicolasa Ducking, NP Eula Listen, PA-C Cadence Fransico Michael, PA-C Charlsie Quest, NP Carlos Levering, NP

## 2023-11-28 NOTE — Telephone Encounter (Signed)
 PAP: Patient assistance application for Jardiance through [employer]) has been mailed to pt's home address on file. Provider portion of application will be faxed to provider's office.   Patient doesn't have ins

## 2023-11-28 NOTE — Telephone Encounter (Signed)
 I called pt to make her aware

## 2023-11-29 ENCOUNTER — Other Ambulatory Visit: Payer: Self-pay

## 2023-12-03 ENCOUNTER — Other Ambulatory Visit: Payer: Self-pay

## 2023-12-04 ENCOUNTER — Telehealth: Payer: Self-pay | Admitting: Family Medicine

## 2023-12-04 ENCOUNTER — Other Ambulatory Visit: Payer: Self-pay

## 2023-12-04 NOTE — Telephone Encounter (Signed)
 Dr Birdie Sons is no longer at this location. Please call the office to schedule a Transfer of Care to either Dr Charlann Lange, MD , Darleen Crocker or Kara Dies, NP  Thank you

## 2023-12-12 ENCOUNTER — Other Ambulatory Visit (HOSPITAL_COMMUNITY): Payer: Self-pay

## 2023-12-12 NOTE — Addendum Note (Signed)
 Addended by: Elease Etienne A on: 12/12/2023 10:07 AM   Modules accepted: Orders

## 2023-12-12 NOTE — Progress Notes (Signed)
 Remote ICD transmission.

## 2023-12-12 NOTE — Telephone Encounter (Signed)
 I called the pt and she said she put it in the mail Saturday

## 2023-12-14 ENCOUNTER — Other Ambulatory Visit: Payer: Self-pay

## 2023-12-19 ENCOUNTER — Other Ambulatory Visit (HOSPITAL_COMMUNITY): Payer: Self-pay

## 2023-12-24 ENCOUNTER — Other Ambulatory Visit (HOSPITAL_COMMUNITY): Payer: Self-pay

## 2023-12-24 NOTE — Telephone Encounter (Signed)
 I called and left the patient a message since we have not received yet

## 2023-12-28 ENCOUNTER — Other Ambulatory Visit: Payer: Self-pay

## 2023-12-28 ENCOUNTER — Other Ambulatory Visit: Payer: Self-pay | Admitting: Medical

## 2023-12-28 ENCOUNTER — Other Ambulatory Visit: Payer: Self-pay | Admitting: Internal Medicine

## 2023-12-28 DIAGNOSIS — E785 Hyperlipidemia, unspecified: Secondary | ICD-10-CM

## 2023-12-29 ENCOUNTER — Other Ambulatory Visit (HOSPITAL_BASED_OUTPATIENT_CLINIC_OR_DEPARTMENT_OTHER): Payer: Self-pay

## 2023-12-30 ENCOUNTER — Other Ambulatory Visit: Payer: Self-pay | Admitting: Internal Medicine

## 2023-12-30 ENCOUNTER — Other Ambulatory Visit: Payer: Self-pay

## 2023-12-31 ENCOUNTER — Other Ambulatory Visit: Payer: Self-pay

## 2023-12-31 ENCOUNTER — Other Ambulatory Visit: Payer: Self-pay | Admitting: Family Medicine

## 2023-12-31 DIAGNOSIS — F331 Major depressive disorder, recurrent, moderate: Secondary | ICD-10-CM

## 2023-12-31 MED ORDER — ROSUVASTATIN CALCIUM 40 MG PO TABS
40.0000 mg | ORAL_TABLET | Freq: Every day | ORAL | 0 refills | Status: DC
  Filled 2023-12-31: qty 90, 90d supply, fill #0

## 2023-12-31 MED ORDER — EZETIMIBE 10 MG PO TABS
10.0000 mg | ORAL_TABLET | Freq: Every day | ORAL | 0 refills | Status: DC
  Filled 2023-12-31: qty 90, 90d supply, fill #0

## 2023-12-31 MED ORDER — CLOPIDOGREL BISULFATE 75 MG PO TABS
75.0000 mg | ORAL_TABLET | Freq: Every day | ORAL | 0 refills | Status: DC
Start: 2023-12-31 — End: 2024-04-03
  Filled 2023-12-31: qty 90, 90d supply, fill #0

## 2023-12-31 MED ORDER — BUPROPION HCL ER (XL) 150 MG PO TB24
150.0000 mg | ORAL_TABLET | Freq: Every day | ORAL | 0 refills | Status: DC
  Filled 2023-12-31: qty 30, 30d supply, fill #0

## 2023-12-31 MED FILL — Carvedilol Tab 6.25 MG: ORAL | 90 days supply | Qty: 180 | Fill #0 | Status: AC

## 2024-01-01 ENCOUNTER — Other Ambulatory Visit: Payer: Self-pay

## 2024-01-01 ENCOUNTER — Telehealth: Payer: Self-pay

## 2024-01-01 NOTE — Telephone Encounter (Signed)
 Pt needs a TOC appt, e fax was placed in lester folder for a refill on atarax however, pt is not scheduled with Bethanie Dicker, NP

## 2024-01-02 ENCOUNTER — Other Ambulatory Visit: Payer: Self-pay

## 2024-01-03 ENCOUNTER — Other Ambulatory Visit: Payer: Self-pay

## 2024-01-03 NOTE — Telephone Encounter (Signed)
 I called and left the patient a message since we have not received yet

## 2024-01-04 ENCOUNTER — Other Ambulatory Visit: Payer: Self-pay

## 2024-01-07 ENCOUNTER — Other Ambulatory Visit: Payer: Self-pay

## 2024-01-08 ENCOUNTER — Other Ambulatory Visit: Payer: Self-pay

## 2024-01-09 ENCOUNTER — Other Ambulatory Visit: Payer: Self-pay

## 2024-01-10 ENCOUNTER — Other Ambulatory Visit: Payer: Self-pay

## 2024-01-10 NOTE — Telephone Encounter (Signed)
    3rd time:  I called and left the patient a message since we have not received yet

## 2024-01-14 ENCOUNTER — Other Ambulatory Visit: Payer: Self-pay

## 2024-01-15 ENCOUNTER — Other Ambulatory Visit: Payer: Self-pay

## 2024-01-16 ENCOUNTER — Other Ambulatory Visit: Payer: Self-pay

## 2024-01-16 MED ORDER — TIZANIDINE HCL 4 MG PO TABS
4.0000 mg | ORAL_TABLET | Freq: Every day | ORAL | 2 refills | Status: DC | PRN
  Filled 2024-01-16: qty 60, 30d supply, fill #0
  Filled 2024-02-19: qty 60, 30d supply, fill #1
  Filled 2024-03-21: qty 28, 14d supply, fill #2
  Filled 2024-03-21: qty 32, 16d supply, fill #2

## 2024-01-17 NOTE — Telephone Encounter (Signed)
 Called patient again since we never got form. No answer. Closing for now

## 2024-01-18 ENCOUNTER — Ambulatory Visit
Admission: RE | Admit: 2024-01-18 | Discharge: 2024-01-18 | Disposition: A | Discharge status: Home / Self Care | Payer: Self-pay | Source: Ambulatory Visit | Attending: Nurse Practitioner | Admitting: Nurse Practitioner

## 2024-01-18 ENCOUNTER — Other Ambulatory Visit: Payer: Self-pay

## 2024-01-18 ENCOUNTER — Encounter: Payer: Self-pay | Admitting: Nurse Practitioner

## 2024-01-18 ENCOUNTER — Ambulatory Visit (INDEPENDENT_AMBULATORY_CARE_PROVIDER_SITE_OTHER): Payer: Self-pay | Admitting: Nurse Practitioner

## 2024-01-18 ENCOUNTER — Ambulatory Visit
Admission: RE | Admit: 2024-01-18 | Discharge: 2024-01-18 | Disposition: A | Discharge status: Home / Self Care | Payer: Self-pay | Attending: Nurse Practitioner | Admitting: Nurse Practitioner

## 2024-01-18 VITALS — BP 146/100 | HR 79 | Temp 98.6°F | Ht 65.0 in | Wt 186.8 lb

## 2024-01-18 DIAGNOSIS — F419 Anxiety disorder, unspecified: Secondary | ICD-10-CM

## 2024-01-18 DIAGNOSIS — M25531 Pain in right wrist: Secondary | ICD-10-CM

## 2024-01-18 DIAGNOSIS — E785 Hyperlipidemia, unspecified: Secondary | ICD-10-CM

## 2024-01-18 DIAGNOSIS — H04203 Unspecified epiphora, bilateral lacrimal glands: Secondary | ICD-10-CM

## 2024-01-18 DIAGNOSIS — G8929 Other chronic pain: Secondary | ICD-10-CM

## 2024-01-18 DIAGNOSIS — R7303 Prediabetes: Secondary | ICD-10-CM

## 2024-01-18 DIAGNOSIS — F32A Depression, unspecified: Secondary | ICD-10-CM

## 2024-01-18 DIAGNOSIS — I10 Essential (primary) hypertension: Secondary | ICD-10-CM

## 2024-01-18 DIAGNOSIS — B351 Tinea unguium: Secondary | ICD-10-CM

## 2024-01-18 DIAGNOSIS — M545 Low back pain, unspecified: Secondary | ICD-10-CM

## 2024-01-18 LAB — COMPREHENSIVE METABOLIC PANEL WITH GFR
ALT: 27 U/L (ref 0–35)
AST: 24 U/L (ref 0–37)
Albumin: 4.3 g/dL (ref 3.5–5.2)
Alkaline Phosphatase: 122 U/L — ABNORMAL HIGH (ref 39–117)
BUN: 11 mg/dL (ref 6–23)
CO2: 26 meq/L (ref 19–32)
Calcium: 9.3 mg/dL (ref 8.4–10.5)
Chloride: 105 meq/L (ref 96–112)
Creatinine, Ser: 0.89 mg/dL (ref 0.40–1.20)
GFR: 70.56 mL/min (ref >60.00)
Glucose, Bld: 105 mg/dL — ABNORMAL HIGH (ref 70–99)
Potassium: 3.4 meq/L — ABNORMAL LOW (ref 3.5–5.1)
Sodium: 140 meq/L (ref 135–145)
Total Bilirubin: 1 mg/dL (ref 0.2–1.2)
Total Protein: 7.3 g/dL (ref 6.0–8.3)

## 2024-01-18 LAB — LIPID PANEL
Cholesterol: 137 mg/dL (ref 0–200)
HDL: 58.9 mg/dL (ref >39.00)
LDL Cholesterol: 56 mg/dL (ref 0–99)
NonHDL: 78.07
Total CHOL/HDL Ratio: 2
Triglycerides: 109 mg/dL (ref 0.0–149.0)
VLDL: 21.8 mg/dL (ref 0.0–40.0)

## 2024-01-18 LAB — HEMOGLOBIN A1C: Hgb A1c MFr Bld: 6.4 % (ref 4.6–6.5)

## 2024-01-18 MED ORDER — HYDROXYZINE HCL 10 MG PO TABS
10.0000 mg | ORAL_TABLET | Freq: Three times a day (TID) | ORAL | 3 refills | Status: AC | PRN
  Filled 2024-01-18: qty 90, 30d supply, fill #0
  Filled 2024-04-02: qty 90, 30d supply, fill #1
  Filled 2024-10-14 – 2024-10-16 (×2): qty 90, 30d supply, fill #2

## 2024-01-18 MED ORDER — BUPROPION HCL ER (XL) 150 MG PO TB24
150.0000 mg | ORAL_TABLET | Freq: Every day | ORAL | 3 refills | Status: AC
  Filled 2024-01-18 – 2024-02-24 (×2): qty 90, 90d supply, fill #0
  Filled 2024-07-15: qty 90, 90d supply, fill #1
  Filled 2024-10-14 – 2024-10-16 (×2): qty 90, 90d supply, fill #2

## 2024-01-18 NOTE — Progress Notes (Signed)
 Crystal Burkitt, NP-C Phone: [phone]  Crystal Roberson is a 58 y.o. female who presents today for transfer of care.   Discussed the use of AI scribe software for clinical note transcription with the patient, who gave verbal consent to proceed.  History of Present Illness   Crystal Roberson is a 58 year old female with hypertension and coronary artery disease who presents for medication refills and transfer of care.  She experiences significant fluctuations in blood pressure, with episodes of extremely high readings followed by sudden drops, such as a recent episode where her blood pressure fell to 60/30 mmHg. These episodes are accompanied by dry mouth, dizziness, and a need to lie down. She describes feeling 'like a washed out dish rag' and needing to sleep for several hours after these episodes. She monitors her blood pressure regularly and notes these fluctuations. She is followed closely by Cardiology.  She has a history of a heart attack and experiences persistent shortness of breath since then. Shortness of breath occurs with minimal exertion, such as taking a shower, requiring her to sit down to recover. No chest pain is reported, but she occasionally uses Lasix  for swelling. Her current medications include carvedilol  6.25 mg twice daily and spironolactone  12.5 mg daily.  She is currently taking Cymbalta  60 mg, Wellbutrin , and Atarax  (hydroxyzine ) as needed. Her mood has improved with Cymbalta  compared to Lexapro , which previously caused her to feel 'weepy'.  She experiences chronic back and hip pain, managed with pain management under Dr. Phyllis Breeze. She notes that her pain may contribute to elevated blood pressure readings.  She reports persistent watery eyes, which she attributes to possible eye strain. She uses eye drops from [facility], which provide temporary relief. No allergies are identified as a cause.  She has a fungal infection on her two big toes, which initially improved with over-the-counter  brush-on treatment but has since plateaued.  She injured her right wrist a couple of months ago, resulting in a bone protrusion and pain, especially when moving her thumb. The pain is described as shooting, and she has difficulty holding and lifting objects. She has used ice and heat initially but has not seen significant improvement.      Social History   Tobacco Use  Smoking Status Former   Current packs/day: 0.00   Average packs/day: 0.8 packs/day for 30.0 years (22.5 ttl pk-yrs)   Types: Cigarettes   Start date: 09/23/1986   Quit date: 09/23/2016   Years since quitting: 7.3  Smokeless Tobacco Never    Current Outpatient Medications on File Prior to Visit  Medication Sig Dispense Refill   carvedilol  (COREG ) 6.25 MG tablet Take 1 tablet (6.25 mg total) by mouth 2 (two) times daily. 180 tablet 0   clopidogrel  (PLAVIX ) 75 MG tablet Take 1 tablet (75 mg total) by mouth once daily. 90 tablet 0   DULoxetine  (CYMBALTA ) 30 MG capsule Take 1 capsule (30 mg total) by mouth daily for 14 days, THEN 2 capsules (60 mg total) daily. 60 capsule 3   ezetimibe  (ZETIA ) 10 MG tablet Take 1 tablet (10 mg total) by mouth daily. 90 tablet 0   furosemide  (LASIX ) 20 MG tablet Take 1 tablet (20 mg total) by mouth once daily as needed. 30 tablet 4   lidocaine  (LIDODERM ) 5 % Place 2-3 patches onto the skin every 12 (twelve) hours as needed (pain). Remove & Discard patch within 12 hours or as directed by MD     lidocaine  (LIDODERM ) 5 % Place  1-3 patches onto the skin as directed for 12 hours on and 12 hours off 90 patch 3   lidocaine  (LIDODERM ) 5 % Place 1-3 patches onto the skin every 12 (twelve) hours. 12 hours on and 12 hours off. 90 patch 3   morphine  (MS CONTIN ) 15 MG 12 hr tablet Take 15 mg by mouth every 8 (eight) hours.      morphine  (MS CONTIN ) 15 MG 12 hr tablet Take 1 tablet (15 mg total) by mouth every 8 (eight) hours. 90 tablet 0   morphine  (MS CONTIN ) 15 MG 12 hr tablet Take 1 tablet (15 mg total)  by mouth every 8 (eight) hours. Fill on/after 11/04/2023 90 tablet 0   morphine  (MS CONTIN ) 15 MG 12 hr tablet Take 1 tablet (15 mg total) by mouth every 8 (eight) hours. TBD 11/04/2023 may dispense 11/02/2023 due to pharmacy being closed on Sunday 90 tablet 0   nitroGLYCERIN  (NITROSTAT ) 0.4 MG SL tablet Dissolve 1 tablet (0.4 mg total) under the tongue once every 5 (five) minutes as needed for chest pain. Max of 3 doses in 15 minutes. 25 tablet 3   potassium chloride  SA (KLOR-CON  M) 20 MEQ tablet Take 1 tablet (20 mEq total) by mouth daily. (IF YOU TAKE LASIX  TAKE AN EXTRA DOSE AS DIRECTED) 90 tablet 0   rosuvastatin  (CRESTOR ) 40 MG tablet Take 1 tablet (40 mg total) by mouth daily. 90 tablet 0   spironolactone  (ALDACTONE ) 25 MG tablet Take 1/2 tablet (12.5 mg total) by mouth daily. 45 tablet 3   tiZANidine  (ZANAFLEX ) 4 MG tablet Take 1-2 tablets (4-8 mg total) by mouth daily as needed for muscle spasm. 60 tablet 2   empagliflozin  (JARDIANCE ) 10 MG TABS tablet Take 1 tablet (10 mg total) by mouth daily before breakfast. (Patient not taking: Reported on 01/18/2024)     No current facility-administered medications on file prior to visit.     ROS see history of present illness  Objective  Physical Exam Vitals:   01/18/24 1011 01/18/24 1035  BP: (!) 150/98 (!) 146/100  Pulse: 79   Temp: 98.6 F (37 C)   SpO2: 97%     BP Readings from Last 3 Encounters:  01/18/24 (!) 146/100  11/28/23 (!) 144/96  08/27/23 110/78   Wt Readings from Last 3 Encounters:  01/18/24 186 lb 12.8 oz (84.7 kg)  11/28/23 193 lb 9.6 oz (87.8 kg)  08/27/23 190 lb 8 oz (86.4 kg)    Physical Exam Constitutional:      General: She is not in acute distress.    Appearance: Normal appearance.  HENT:     Head: Normocephalic.  Cardiovascular:     Rate and Rhythm: Normal rate and regular rhythm.     Heart sounds: Normal heart sounds.  Pulmonary:     Effort: Pulmonary effort is normal.     Breath sounds: Normal  breath sounds.  Musculoskeletal:     Right wrist: Deformity and tenderness present. No swelling.  Skin:    General: Skin is warm and dry.  Neurological:     General: No focal deficit present.     Mental Status: She is alert.  Psychiatric:        Mood and Affect: Mood normal.        Behavior: Behavior normal.      Assessment/Plan: Please see individual problem list.  Right wrist pain Assessment & Plan: She has persistent pain and visible deformity in her right wrist following an injury. Cost concerns  for an x-ray were discussed, along with alternatives. Order an x-ray of the right wrist and advise considering urgent care for a potentially lower-cost x-ray. Discuss the potential need for an orthopedic referral based on x-ray results. Continue conservative management and brace.   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 Recommend follow-up with an ophthalmologist.    Essential hypertension Assessment & Plan: Blood pressure fluctuates, causing dizziness and fatigue, with no clear cause identified. Chronic pain may contribute to elevated blood pressure. Elevated x 2 today in office. She will continue her current medication regimen of Coreg  6.25 mg twice daily and Spironolactone  12.5 mg daily. Continue to monitor BP daily at home and contact if remaining consistently elevated.   Orders: -     Comprehensive metabolic panel with GFR  Onychomycosis Assessment & Plan: Onychomycosis shows plateaued improvement with topical treatment, only used for short period of time. Limited to big toes only. Continue using over-the-counter brush-on antifungal treatment, advised to use consistently for several months for optimal results. Consider oral medication such as Lamisil if worsening.    Chronic low back pain, unspecified back pain laterality, unspecified whether  sciatica present Assessment & Plan: Chronic pain may elevate blood pressure. Pain management is overseen by Dr. Phyllis Breeze. Follow up as scheduled.    Hyperlipidemia LDL goal <70 Assessment & Plan: Managed with Crestor  40 mg daily. Continue. Check lipid panel.   Orders: -     Lipid panel  Anxiety and depression Assessment & Plan: Mood stable at this time on current regimen of Wellbutrin  XL 150 mg daily, Cymbalta  60 mg daily and Hydroxyzine  10 mg every 8 hours as needed. Continue. Encouraged to contact if worsening symptoms, unusual behavior changes or suicidal thoughts occur.   Orders: -     hydrOXYzine  HCl; Take 1 tablet (10 mg total) by mouth every 8 (eight) hours as needed for anxiety.  Dispense: 90 tablet; Refill: 3 -     buPROPion  HCl ER (XL); Take 1 tablet (150 mg total) by mouth once daily.  Dispense: 90 tablet; Refill: 3  Prediabetes Assessment & Plan: Check A1c today. Encourage healthy diet and exercise as tolerated.   Orders: -     Hemoglobin A1c    Return in about 6 months (around 07/19/2024) for Follow up.   Crystal Burkitt, NP-C [facility]

## 2024-01-21 ENCOUNTER — Telehealth: Payer: Self-pay | Admitting: Pharmacy Technician

## 2024-01-21 NOTE — Telephone Encounter (Signed)
 I received the patient application. She still has to get me her proof of income documents. I have scanned in media under "[employer]  BLANK PROVIDER FORM" the provider portion of the application if someone can get the provider to sign it, thank you!!

## 2024-01-25 NOTE — Telephone Encounter (Signed)
 Faxed provider form and patient app to [employer]. Still missing proof of income

## 2024-01-25 NOTE — Telephone Encounter (Signed)
 Pt assistance forms completed and faxed to prior auth department.

## 2024-01-29 ENCOUNTER — Encounter: Payer: Self-pay | Admitting: Nurse Practitioner

## 2024-01-29 DIAGNOSIS — B351 Tinea unguium: Secondary | ICD-10-CM | POA: Insufficient documentation

## 2024-01-29 DIAGNOSIS — H04203 Unspecified epiphora, bilateral lacrimal glands: Secondary | ICD-10-CM | POA: Insufficient documentation

## 2024-01-29 NOTE — Assessment & Plan Note (Signed)
 Chronic pain may elevate blood pressure. Pain management is overseen by Dr. Phyllis Breeze. Follow up as scheduled.

## 2024-01-29 NOTE — Assessment & Plan Note (Signed)
 Blood pressure fluctuates, causing dizziness and fatigue, with no clear cause identified. Chronic pain may contribute to elevated blood pressure. Elevated x 2 today in office. She will continue her current medication regimen of Coreg  6.25 mg twice daily and Spironolactone  12.5 mg daily. Continue to monitor BP daily at home and contact if remaining consistently elevated.

## 2024-01-29 NOTE — Assessment & Plan Note (Signed)
 She has persistent pain and visible deformity in her right wrist following an injury. Cost concerns for an x-ray were discussed, along with alternatives. Order an x-ray of the right wrist and advise considering urgent care for a potentially lower-cost x-ray. Discuss the potential need for an orthopedic referral based on x-ray results. Continue conservative management and brace.

## 2024-01-29 NOTE — Assessment & Plan Note (Addendum)
 Managed with Crestor  40 mg daily. Continue. Check lipid panel.

## 2024-01-29 NOTE — Assessment & Plan Note (Signed)
 Mood stable at this time on current regimen of Wellbutrin  XL 150 mg daily, Cymbalta  60 mg daily and Hydroxyzine  10 mg every 8 hours as needed. Continue. Encouraged to contact if worsening symptoms, unusual behavior changes or suicidal thoughts occur.

## 2024-01-29 NOTE — Assessment & Plan Note (Signed)
 Check A1c today. Encourage healthy diet and exercise as tolerated.

## 2024-01-29 NOTE — Assessment & Plan Note (Signed)
 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 Recommend follow-up with an ophthalmologist.

## 2024-01-29 NOTE — Assessment & Plan Note (Signed)
 Onychomycosis shows plateaued improvement with topical treatment, only used for short period of time. Limited to big toes only. Continue using over-the-counter brush-on antifungal treatment, advised to use consistently for several months for optimal results. Consider oral medication such as Lamisil if worsening.

## 2024-01-30 ENCOUNTER — Other Ambulatory Visit: Payer: Self-pay

## 2024-01-30 MED ORDER — MORPHINE SULFATE ER 15 MG PO TBCR
15.0000 mg | EXTENDED_RELEASE_TABLET | Freq: Three times a day (TID) | ORAL | 0 refills | Status: DC
Start: 2024-02-14 — End: 2024-03-19
  Filled 2024-02-19: qty 90, 30d supply, fill #0

## 2024-01-30 MED ORDER — MORPHINE SULFATE 15 MG PO TABS
15.0000 mg | ORAL_TABLET | Freq: Three times a day (TID) | ORAL | 0 refills | Status: AC
  Filled 2024-03-21: qty 90, 30d supply, fill #0

## 2024-01-30 NOTE — Telephone Encounter (Signed)
   I called the patient and she is aware of approval

## 2024-02-07 ENCOUNTER — Ambulatory Visit (INDEPENDENT_AMBULATORY_CARE_PROVIDER_SITE_OTHER): Payer: Self-pay

## 2024-02-07 DIAGNOSIS — I255 Ischemic cardiomyopathy: Secondary | ICD-10-CM

## 2024-02-07 LAB — CUP PACEART REMOTE DEVICE CHECK
Battery Remaining Longevity: 50 mo
Battery Remaining Percentage: 49 %
Battery Voltage: 2.95 V
Brady Statistic RV Percent Paced: 1 %
Date Time Interrogation Session: 20250515020015
HighPow Impedance: 77 Ohm
HighPow Impedance: 77 Ohm
Implantable Lead Connection Status: 753985
Implantable Lead Implant Date: 20180906
Implantable Lead Location: 753860
Implantable Pulse Generator Implant Date: 20180906
Lead Channel Impedance Value: 400 Ohm
Lead Channel Pacing Threshold Amplitude: 1 V
Lead Channel Pacing Threshold Pulse Width: 0.5 ms
Lead Channel Sensing Intrinsic Amplitude: 12 mV
Lead Channel Setting Pacing Amplitude: 2.5 V
Lead Channel Setting Pacing Pulse Width: 0.5 ms
Lead Channel Setting Sensing Sensitivity: 0.5 mV
Pulse Gen Serial Number: 9783396

## 2024-02-11 ENCOUNTER — Ambulatory Visit: Payer: Self-pay | Admitting: Cardiology

## 2024-02-19 ENCOUNTER — Other Ambulatory Visit: Payer: Self-pay

## 2024-02-20 ENCOUNTER — Other Ambulatory Visit: Payer: Self-pay

## 2024-02-21 ENCOUNTER — Other Ambulatory Visit: Payer: Self-pay

## 2024-02-24 ENCOUNTER — Other Ambulatory Visit: Payer: Self-pay | Admitting: Internal Medicine

## 2024-02-25 ENCOUNTER — Other Ambulatory Visit: Payer: Self-pay

## 2024-02-25 ENCOUNTER — Other Ambulatory Visit: Payer: Self-pay | Admitting: Internal Medicine

## 2024-02-25 MED FILL — Potassium Chloride Microencapsulated Crys ER Tab 20 mEq: ORAL | 90 days supply | Qty: 90 | Fill #0 | Status: AC

## 2024-02-29 ENCOUNTER — Ambulatory Visit: Payer: Self-pay | Admitting: Medical

## 2024-02-29 ENCOUNTER — Encounter: Payer: Self-pay | Admitting: Medical

## 2024-02-29 ENCOUNTER — Other Ambulatory Visit: Payer: Self-pay

## 2024-02-29 ENCOUNTER — Ambulatory Visit: Payer: Self-pay | Attending: Medical | Admitting: Medical

## 2024-02-29 ENCOUNTER — Other Ambulatory Visit
Admission: RE | Admit: 2024-02-29 | Discharge: 2024-02-29 | Disposition: A | Discharge status: Home / Self Care | Payer: Self-pay | Source: Ambulatory Visit | Attending: Medical | Admitting: Medical

## 2024-02-29 VITALS — BP 158/96 | HR 71 | Ht 65.0 in | Wt 193.2 lb

## 2024-02-29 DIAGNOSIS — I5022 Chronic systolic (congestive) heart failure: Secondary | ICD-10-CM

## 2024-02-29 DIAGNOSIS — E782 Mixed hyperlipidemia: Secondary | ICD-10-CM

## 2024-02-29 DIAGNOSIS — I10 Essential (primary) hypertension: Secondary | ICD-10-CM | POA: Diagnosis not present

## 2024-02-29 DIAGNOSIS — G8929 Other chronic pain: Secondary | ICD-10-CM

## 2024-02-29 DIAGNOSIS — M549 Dorsalgia, unspecified: Secondary | ICD-10-CM

## 2024-02-29 DIAGNOSIS — I2510 Atherosclerotic heart disease of native coronary artery without angina pectoris: Secondary | ICD-10-CM | POA: Diagnosis not present

## 2024-02-29 DIAGNOSIS — Z79899 Other long term (current) drug therapy: Secondary | ICD-10-CM | POA: Insufficient documentation

## 2024-02-29 LAB — BASIC METABOLIC PANEL WITH GFR
Anion gap: 7 (ref 5–15)
BUN: 11 mg/dL (ref 6–20)
CO2: 26 mmol/L (ref 22–32)
Calcium: 8.8 mg/dL — ABNORMAL LOW (ref 8.9–10.3)
Chloride: 107 mmol/L (ref 98–111)
Creatinine, Ser: 0.92 mg/dL (ref 0.44–1.00)
GFR, Estimated: >60 mL/min (ref >60)
Glucose, Bld: 107 mg/dL — ABNORMAL HIGH (ref 70–99)
Potassium: 3.9 mmol/L (ref 3.5–5.1)
Sodium: 140 mmol/L (ref 135–145)

## 2024-02-29 MED ORDER — LISINOPRIL 5 MG PO TABS
5.0000 mg | ORAL_TABLET | Freq: Every day | ORAL | 3 refills | Status: AC
  Filled 2024-02-29: qty 90, 90d supply, fill #0
  Filled 2024-07-15: qty 90, 90d supply, fill #1
  Filled 2024-10-14 – 2024-10-16 (×2): qty 90, 90d supply, fill #2

## 2024-02-29 NOTE — Patient Instructions (Addendum)
 Medication Instructions:  Your physician recommends the following medication change.  START TAKING:  Lisinopril  5 mg by mouth once daily  Continue taking all other medications as directed.    *If you need a refill on your cardiac medications before your next appointment, please call your pharmacy*  Lab Work:  Your provider would like for you to have following labs drawn today BMET.   If you have labs (blood work) drawn today and your tests are completely normal, you will receive your results only by: MyChart Message (if you have MyChart) OR A paper copy in the mail If you have any lab test that is abnormal or we need to change your treatment, we will call you to review the results.  Testing/Procedures:    No test ordered today   Follow-Up:  At [facility], you and your health needs are our priority.  As part of our continuing mission to provide you with exceptional heart care, our providers are all part of one team.  This team includes your primary Cardiologist (physician) and Advanced Practice Providers or APPs (Physician Assistants and Nurse Practitioners) who all work together to provide you with the care you need, when you need it.  Your next appointment:   6 month(s)  Provider:   Sammy Crisp, MD or Cadence Gennaro Khat, PA-C

## 2024-02-29 NOTE — Progress Notes (Signed)
 Cardiology Office Note   Date:  02/29/2024  ID:  Crystal Roberson, DOB October 12, 1965, MRN [id number] PCP: Bluford Burkitt, NP  [facility] Providers Cardiologist:  Sammy Crisp, MD   History of Present Illness Crystal Roberson is a 58 y.o. 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   Recent echo showed LVEF 35-40% with anteroseptal and apical akinesis and was started on empaglifozin 10mg  daily.   The patient was last seen 11/28/23 reporting labile blood pressures. She was started on spiro  Today, the patient reports Bps are better. The patient repots she is having severe back pain. She didn't sleep well last night due to the back pain. She denies chest pain. She has chronic SOB. She started the Jardiance  a week ago. She denies lower leg edema.  Studies Reviewed EKG Interpretation Date/Time:  Friday February 29 2024 10:41:24 EDT Ventricular Rate:  71 PR Interval:  148 QRS Duration:  76 QT Interval:  372 QTC Calculation: 404 R Axis:   3  Text Interpretation: Normal sinus rhythm Septal infarct (cited on or before 13-Jul-2023) Inferior infarct (cited on or before 13-Jul-2023) When compared with ECG of 13-Jul-2023 09:18, No significant change was found Confirmed by Gennaro Khat, Ashana Tullo ([id number]) on 02/29/2024 10:58:12 AM    Echo 07/2023 1. Left ventricular ejection fraction, by estimation, is 35 to 40%. The  left ventricle has moderately decreased function. The left ventricle  demonstrates regional wall motion abnormalities (see scoring  diagram/findings for description). Left ventricular   diastolic parameters are indeterminate. There is akinesis of the left  ventricular, entire anteroseptal wall and apical segment.   2. Right ventricular systolic function is normal. The right ventricular  size is  normal.   3. Left atrial size was mildly dilated.   4. The mitral valve is normal in structure. Mild mitral valve  regurgitation.   5. The aortic valve was not well visualized. Aortic valve regurgitation  is not visualized.   Physical Exam VS:  BP (!) 158/96   Pulse 71   Ht 5\' 5"  (1.651 m)   Wt 193 lb 3.2 oz (87.6 kg)   SpO2 95%   BMI 32.15 kg/m    Wt Readings from Last 3 Encounters:  02/29/24 193 lb 3.2 oz (87.6 kg)  01/18/24 186 lb 12.8 oz (84.7 kg)  11/28/23 193 lb 9.6 oz (87.8 kg)    GEN: Well nourished, well developed in no acute distress NECK: No JVD; No carotid bruits CARDIAC: RRR, no murmurs, rubs, gallops RESPIRATORY:  Clear to auscultation without rales, wheezing or rhonchi  ABDOMEN: Soft, non-tender, non-distended EXTREMITIES:  No edema; No deformity   ASSESSMENT AND PLAN  CAD s/p PCI/DES mLAD 2017 The patient denies anginal symptoms. Continue Plavix , Zetia  and Crestor .   HFrEF/ICM Echo 07/2023 showed LVEF 35-40%, akinesis of the LV, normal RVSF, mild MR. Prior echo showed LVEF 40-45%. Patient is euvolemic on exam. She started [place] a week ago, BMET today. Continue spirolactone 12.5mg  daily, Farxiga 10mg  daily and Coreg  6.25mg BID. I will add on low-dose lisinopril .   HTN BP is mildly elevated. I will add on Lisinopril  5mg  daily.   HLD LDL 56. Continue Crestor .   Chronic back pain This is a chronic issue and may be affecting her blood pressure.   Dispo: Follow-up in 3 months  Signed, Leone Putman Rebekah Canada, PA-C

## 2024-03-17 NOTE — Progress Notes (Signed)
 Remote ICD transmission.

## 2024-03-19 ENCOUNTER — Other Ambulatory Visit: Payer: Self-pay

## 2024-03-19 MED ORDER — MORPHINE SULFATE ER 15 MG PO TBCR
15.0000 mg | EXTENDED_RELEASE_TABLET | Freq: Three times a day (TID) | ORAL | 0 refills | Status: AC
  Filled 2024-03-21: qty 90, 30d supply, fill #0

## 2024-03-21 ENCOUNTER — Other Ambulatory Visit: Payer: Self-pay

## 2024-04-02 ENCOUNTER — Other Ambulatory Visit: Payer: Self-pay | Admitting: Medical

## 2024-04-02 ENCOUNTER — Other Ambulatory Visit: Payer: Self-pay | Admitting: Internal Medicine

## 2024-04-02 ENCOUNTER — Other Ambulatory Visit: Payer: Self-pay

## 2024-04-02 DIAGNOSIS — E785 Hyperlipidemia, unspecified: Secondary | ICD-10-CM

## 2024-04-03 ENCOUNTER — Other Ambulatory Visit: Payer: Self-pay | Admitting: Internal Medicine

## 2024-04-03 ENCOUNTER — Other Ambulatory Visit: Payer: Self-pay

## 2024-04-03 ENCOUNTER — Other Ambulatory Visit: Payer: Self-pay | Admitting: Nurse Practitioner

## 2024-04-03 DIAGNOSIS — F419 Anxiety disorder, unspecified: Secondary | ICD-10-CM

## 2024-04-03 MED ORDER — EZETIMIBE 10 MG PO TABS
10.0000 mg | ORAL_TABLET | Freq: Every day | ORAL | 2 refills | Status: AC
  Filled 2024-04-03: qty 90, 90d supply, fill #0
  Filled 2024-07-15 – 2024-07-16 (×2): qty 90, 90d supply, fill #1

## 2024-04-03 MED FILL — Clopidogrel Bisulfate Tab 75 MG (Base Equiv): ORAL | 90 days supply | Qty: 90 | Fill #0 | Status: AC

## 2024-04-03 MED FILL — Duloxetine HCl Enteric Coated Pellets Cap 60 MG (Base Eq): ORAL | 90 days supply | Qty: 90 | Fill #0 | Status: AC

## 2024-04-03 MED FILL — Carvedilol Tab 6.25 MG: ORAL | 90 days supply | Qty: 180 | Fill #0 | Status: AC

## 2024-04-03 MED FILL — Rosuvastatin Calcium Tab 40 MG: ORAL | 90 days supply | Qty: 90 | Fill #0 | Status: AC

## 2024-04-16 ENCOUNTER — Other Ambulatory Visit: Payer: Self-pay

## 2024-04-16 MED ORDER — MORPHINE SULFATE ER 15 MG PO TBCR
15.0000 mg | EXTENDED_RELEASE_TABLET | Freq: Three times a day (TID) | ORAL | 0 refills | Status: AC
  Filled 2024-05-23: qty 90, 30d supply, fill #0

## 2024-04-16 MED ORDER — MORPHINE SULFATE ER 15 MG PO TBCR
15.0000 mg | EXTENDED_RELEASE_TABLET | Freq: Three times a day (TID) | ORAL | 0 refills | Status: AC
  Filled 2024-04-25: qty 90, 30d supply, fill #0

## 2024-04-25 ENCOUNTER — Other Ambulatory Visit: Payer: Self-pay

## 2024-04-27 ENCOUNTER — Other Ambulatory Visit: Payer: Self-pay

## 2024-04-27 MED ORDER — TIZANIDINE HCL 4 MG PO TABS
4.0000 mg | ORAL_TABLET | Freq: Every day | ORAL | 2 refills | Status: AC | PRN
  Filled 2024-04-27: qty 60, 30d supply, fill #0
  Filled 2024-06-06: qty 60, 30d supply, fill #1
  Filled 2024-07-16: qty 60, 30d supply, fill #2

## 2024-05-04 ENCOUNTER — Other Ambulatory Visit: Payer: Self-pay

## 2024-05-05 ENCOUNTER — Other Ambulatory Visit: Payer: Self-pay

## 2024-05-23 ENCOUNTER — Other Ambulatory Visit: Payer: Self-pay

## 2024-06-06 ENCOUNTER — Other Ambulatory Visit: Payer: Self-pay

## 2024-06-09 ENCOUNTER — Other Ambulatory Visit: Payer: Self-pay

## 2024-06-09 MED ORDER — MORPHINE SULFATE ER 15 MG PO TBCR
15.0000 mg | EXTENDED_RELEASE_TABLET | Freq: Three times a day (TID) | ORAL | 0 refills | Status: DC
  Filled 2024-08-01: qty 60, 20d supply, fill #0
  Filled 2024-08-01: qty 30, 10d supply, fill #0

## 2024-06-09 MED ORDER — MORPHINE SULFATE ER 15 MG PO TBCR
15.0000 mg | EXTENDED_RELEASE_TABLET | Freq: Three times a day (TID) | ORAL | 0 refills | Status: AC
  Filled 2024-06-27: qty 90, 30d supply, fill #0

## 2024-06-09 MED ORDER — TIZANIDINE HCL 4 MG PO TABS
4.0000 mg | ORAL_TABLET | Freq: Every day | ORAL | 2 refills | Status: AC | PRN
  Filled 2024-09-03: qty 60, 30d supply, fill #0
  Filled 2024-10-06: qty 60, 30d supply, fill #1

## 2024-06-09 MED ORDER — LIDOCAINE 5 % EX PTCH
1.0000 | MEDICATED_PATCH | CUTANEOUS | 3 refills | Status: AC
  Filled 2024-06-09: qty 30, 10d supply, fill #0

## 2024-06-19 ENCOUNTER — Other Ambulatory Visit: Payer: Self-pay

## 2024-06-27 ENCOUNTER — Other Ambulatory Visit: Payer: Self-pay

## 2024-07-15 MED FILL — Duloxetine HCl Enteric Coated Pellets Cap 60 MG (Base Eq): ORAL | 90 days supply | Qty: 90 | Fill #1 | Status: CN

## 2024-07-15 MED FILL — Clopidogrel Bisulfate Tab 75 MG (Base Equiv): ORAL | 90 days supply | Qty: 90 | Fill #1 | Status: AC

## 2024-07-15 MED FILL — Rosuvastatin Calcium Tab 40 MG: ORAL | 90 days supply | Qty: 90 | Fill #1 | Status: AC

## 2024-07-15 MED FILL — Carvedilol Tab 6.25 MG: ORAL | 90 days supply | Qty: 180 | Fill #1 | Status: AC

## 2024-07-16 ENCOUNTER — Other Ambulatory Visit: Payer: Self-pay

## 2024-07-16 MED FILL — Duloxetine HCl Enteric Coated Pellets Cap 60 MG (Base Eq): ORAL | 90 days supply | Qty: 90 | Fill #1 | Status: AC

## 2024-07-16 MED FILL — Duloxetine HCl Enteric Coated Pellets Cap 60 MG (Base Eq): ORAL | 90 days supply | Qty: 90 | Fill #1 | Status: CN

## 2024-07-17 ENCOUNTER — Other Ambulatory Visit: Payer: Self-pay

## 2024-08-01 ENCOUNTER — Other Ambulatory Visit: Payer: Self-pay

## 2024-08-04 ENCOUNTER — Other Ambulatory Visit: Payer: Self-pay

## 2024-08-04 MED ORDER — MORPHINE SULFATE ER 15 MG PO TBCR
15.0000 mg | EXTENDED_RELEASE_TABLET | Freq: Three times a day (TID) | ORAL | 0 refills | Status: AC
  Filled 2024-10-06: qty 90, 30d supply, fill #0

## 2024-08-04 MED ORDER — MORPHINE SULFATE ER 15 MG PO TBCR
15.0000 mg | EXTENDED_RELEASE_TABLET | Freq: Three times a day (TID) | ORAL | 0 refills | Status: AC
  Filled 2024-09-03: qty 90, 30d supply, fill #0

## 2024-09-03 ENCOUNTER — Other Ambulatory Visit: Payer: Self-pay

## 2024-10-02 ENCOUNTER — Other Ambulatory Visit: Payer: Self-pay

## 2024-10-02 MED ORDER — MORPHINE SULFATE ER 15 MG PO TBCR: 15.0000 mg | EXTENDED_RELEASE_TABLET | Freq: Three times a day (TID) | ORAL | 0 refills | Status: AC

## 2024-10-06 ENCOUNTER — Other Ambulatory Visit: Payer: Self-pay

## 2024-10-14 ENCOUNTER — Other Ambulatory Visit: Payer: Self-pay | Admitting: Internal Medicine

## 2024-10-14 MED FILL — Duloxetine HCl Enteric Coated Pellets Cap 60 MG (Base Eq): ORAL | 90 days supply | Qty: 90 | Fill #2 | Status: CN

## 2024-10-15 ENCOUNTER — Other Ambulatory Visit: Payer: Self-pay

## 2024-10-16 ENCOUNTER — Other Ambulatory Visit: Payer: Self-pay

## 2024-10-16 MED FILL — Duloxetine HCl Enteric Coated Pellets Cap 60 MG (Base Eq): ORAL | 60 days supply | Qty: 60 | Fill #2 | Status: CN

## 2024-10-16 MED FILL — Duloxetine HCl Enteric Coated Pellets Cap 60 MG (Base Eq): ORAL | 90 days supply | Qty: 90 | Fill #2 | Status: CN

## 2024-10-17 ENCOUNTER — Other Ambulatory Visit: Payer: Self-pay

## 2024-10-17 ENCOUNTER — Other Ambulatory Visit: Payer: Self-pay | Admitting: Internal Medicine

## 2024-10-17 ENCOUNTER — Other Ambulatory Visit (HOSPITAL_COMMUNITY): Payer: Self-pay

## 2024-10-17 DIAGNOSIS — I5022 Chronic systolic (congestive) heart failure: Secondary | ICD-10-CM

## 2024-10-17 MED FILL — Duloxetine HCl Enteric Coated Pellets Cap 60 MG (Base Eq): ORAL | 90 days supply | Qty: 90 | Fill #2 | Status: CN

## 2024-10-20 ENCOUNTER — Other Ambulatory Visit: Payer: Self-pay

## 2024-10-20 MED FILL — Potassium Chloride Microencapsulated Crys ER Tab 20 mEq: ORAL | 60 days supply | Qty: 90 | Fill #0 | Status: CN

## 2024-10-29 ENCOUNTER — Other Ambulatory Visit: Payer: Self-pay | Admitting: Medical

## 2024-10-29 ENCOUNTER — Other Ambulatory Visit: Payer: Self-pay

## 2024-10-29 DIAGNOSIS — I5022 Chronic systolic (congestive) heart failure: Secondary | ICD-10-CM

## 2024-10-30 ENCOUNTER — Other Ambulatory Visit: Payer: Self-pay

## 2024-10-31 ENCOUNTER — Other Ambulatory Visit: Payer: Self-pay
